# Patient Record
Sex: Male | Born: 1940
Health system: Southern US, Community
[De-identification: ages and names within clinical notes are randomized; demographics above are authoritative.]

## PROBLEM LIST (undated history)

## (undated) DIAGNOSIS — I509 Heart failure, unspecified: Secondary | ICD-10-CM

## (undated) DIAGNOSIS — F419 Anxiety disorder, unspecified: Secondary | ICD-10-CM

## (undated) DIAGNOSIS — Z950 Presence of cardiac pacemaker: Secondary | ICD-10-CM

## (undated) DIAGNOSIS — F32A Depression, unspecified: Secondary | ICD-10-CM

## (undated) DIAGNOSIS — K219 Gastro-esophageal reflux disease without esophagitis: Secondary | ICD-10-CM

## (undated) DIAGNOSIS — F1020 Alcohol dependence, uncomplicated: Secondary | ICD-10-CM

## (undated) DIAGNOSIS — Z720 Tobacco use: Secondary | ICD-10-CM

## (undated) DIAGNOSIS — M1990 Unspecified osteoarthritis, unspecified site: Secondary | ICD-10-CM

## (undated) DIAGNOSIS — E785 Hyperlipidemia, unspecified: Secondary | ICD-10-CM

## (undated) DIAGNOSIS — I480 Paroxysmal atrial fibrillation: Secondary | ICD-10-CM

## (undated) DIAGNOSIS — I779 Disorder of arteries and arterioles, unspecified: Secondary | ICD-10-CM

## (undated) DIAGNOSIS — I428 Other cardiomyopathies: Secondary | ICD-10-CM

## (undated) DIAGNOSIS — H269 Unspecified cataract: Secondary | ICD-10-CM

## (undated) DIAGNOSIS — I10 Essential (primary) hypertension: Secondary | ICD-10-CM

## (undated) DIAGNOSIS — G4700 Insomnia, unspecified: Secondary | ICD-10-CM

## (undated) DIAGNOSIS — I739 Peripheral vascular disease, unspecified: Secondary | ICD-10-CM

## (undated) DIAGNOSIS — F329 Major depressive disorder, single episode, unspecified: Secondary | ICD-10-CM

## (undated) DIAGNOSIS — J449 Chronic obstructive pulmonary disease, unspecified: Secondary | ICD-10-CM

## (undated) DIAGNOSIS — I2510 Atherosclerotic heart disease of native coronary artery without angina pectoris: Secondary | ICD-10-CM

## (undated) DIAGNOSIS — K279 Peptic ulcer, site unspecified, unspecified as acute or chronic, without hemorrhage or perforation: Secondary | ICD-10-CM

## (undated) HISTORY — PX: EYE SURGERY: SHX253

## (undated) HISTORY — DX: Hyperlipidemia, unspecified: E78.5

## (undated) HISTORY — DX: Anxiety disorder, unspecified: F41.9

## (undated) HISTORY — DX: Peripheral vascular disease, unspecified: I73.9

## (undated) HISTORY — DX: Gastro-esophageal reflux disease without esophagitis: K21.9

## (undated) HISTORY — DX: Tobacco use: Z72.0

## (undated) HISTORY — DX: Peptic ulcer, site unspecified, unspecified as acute or chronic, without hemorrhage or perforation: K27.9

## (undated) HISTORY — DX: Chronic obstructive pulmonary disease, unspecified: J44.9

## (undated) HISTORY — DX: Alcohol dependence, uncomplicated: F10.20

## (undated) HISTORY — DX: Heart failure, unspecified: I50.9

## (undated) HISTORY — DX: Essential (primary) hypertension: I10

## (undated) HISTORY — DX: Presence of cardiac pacemaker: Z95.0

## (undated) HISTORY — DX: Paroxysmal atrial fibrillation: I48.0

## (undated) HISTORY — DX: Unspecified osteoarthritis, unspecified site: M19.90

## (undated) HISTORY — PX: COLONOSCOPY: SHX174

## (undated) HISTORY — DX: Major depressive disorder, single episode, unspecified: F32.9

## (undated) HISTORY — DX: Depression, unspecified: F32.A

## (undated) HISTORY — DX: Insomnia, unspecified: G47.00

## (undated) HISTORY — PX: OTHER SURGICAL HISTORY: SHX169

## (undated) HISTORY — DX: Other cardiomyopathies: I42.8

## (undated) HISTORY — DX: Disorder of arteries and arterioles, unspecified: I77.9

## (undated) HISTORY — DX: Unspecified cataract: H26.9

---

## 1996-12-18 LAB — HM SIGMOIDOSCOPY

## 1997-11-04 ENCOUNTER — Encounter: Admission: RE | Admit: 1997-11-04 | Discharge: 1998-02-02 | Payer: Self-pay | Admitting: Neurosurgery

## 1998-07-20 ENCOUNTER — Observation Stay (HOSPITAL_COMMUNITY): Admission: AD | Admit: 1998-07-20 | Discharge: 1998-07-21 | Payer: Self-pay | Admitting: Cardiology

## 1998-07-20 ENCOUNTER — Encounter: Payer: Self-pay | Admitting: Cardiology

## 1999-11-19 ENCOUNTER — Ambulatory Visit (HOSPITAL_COMMUNITY): Admission: RE | Admit: 1999-11-19 | Discharge: 1999-11-19 | Payer: Self-pay | Admitting: Cardiology

## 1999-11-19 ENCOUNTER — Encounter: Payer: Self-pay | Admitting: Cardiology

## 2000-11-08 ENCOUNTER — Ambulatory Visit (HOSPITAL_COMMUNITY): Admission: RE | Admit: 2000-11-08 | Discharge: 2000-11-08 | Payer: Self-pay | Admitting: Gastroenterology

## 2001-09-23 ENCOUNTER — Inpatient Hospital Stay (HOSPITAL_COMMUNITY): Admission: EM | Admit: 2001-09-23 | Discharge: 2001-09-26 | Payer: Self-pay | Admitting: Cardiology

## 2002-02-07 ENCOUNTER — Encounter: Payer: Self-pay | Admitting: Orthopedic Surgery

## 2002-02-08 ENCOUNTER — Ambulatory Visit (HOSPITAL_COMMUNITY): Admission: RE | Admit: 2002-02-08 | Discharge: 2002-02-08 | Payer: Self-pay | Admitting: Orthopedic Surgery

## 2002-05-20 ENCOUNTER — Encounter: Payer: Self-pay | Admitting: *Deleted

## 2002-05-20 ENCOUNTER — Inpatient Hospital Stay (HOSPITAL_COMMUNITY): Admission: EM | Admit: 2002-05-20 | Discharge: 2002-05-21 | Payer: Self-pay | Admitting: Emergency Medicine

## 2002-08-08 ENCOUNTER — Inpatient Hospital Stay (HOSPITAL_COMMUNITY): Admission: EM | Admit: 2002-08-08 | Discharge: 2002-08-10 | Payer: Self-pay | Admitting: Emergency Medicine

## 2002-08-08 ENCOUNTER — Encounter: Payer: Self-pay | Admitting: Emergency Medicine

## 2002-08-30 ENCOUNTER — Ambulatory Visit (HOSPITAL_COMMUNITY): Admission: RE | Admit: 2002-08-30 | Discharge: 2002-08-31 | Payer: Self-pay | Admitting: Internal Medicine

## 2002-08-31 ENCOUNTER — Encounter: Payer: Self-pay | Admitting: Internal Medicine

## 2002-09-24 ENCOUNTER — Inpatient Hospital Stay (HOSPITAL_COMMUNITY): Admission: EM | Admit: 2002-09-24 | Discharge: 2002-09-28 | Payer: Self-pay | Admitting: Psychiatry

## 2003-02-11 ENCOUNTER — Emergency Department (HOSPITAL_COMMUNITY): Admission: EM | Admit: 2003-02-11 | Discharge: 2003-02-11 | Payer: Self-pay

## 2004-02-28 ENCOUNTER — Inpatient Hospital Stay (HOSPITAL_COMMUNITY): Admission: EM | Admit: 2004-02-28 | Discharge: 2004-02-29 | Payer: Self-pay | Admitting: *Deleted

## 2005-01-26 ENCOUNTER — Ambulatory Visit: Payer: Self-pay | Admitting: Internal Medicine

## 2005-01-31 ENCOUNTER — Ambulatory Visit: Payer: Self-pay

## 2006-08-17 ENCOUNTER — Ambulatory Visit: Payer: Self-pay | Admitting: Cardiology

## 2006-08-18 ENCOUNTER — Ambulatory Visit: Payer: Self-pay | Admitting: Internal Medicine

## 2006-08-24 ENCOUNTER — Encounter: Payer: Self-pay | Admitting: Cardiology

## 2006-08-24 ENCOUNTER — Ambulatory Visit: Payer: Self-pay | Admitting: Cardiology

## 2006-08-24 ENCOUNTER — Ambulatory Visit: Payer: Self-pay

## 2006-08-24 LAB — CONVERTED CEMR LAB
BUN: 10 mg/dL (ref 6–23)
Calcium: 9.5 mg/dL (ref 8.4–10.5)
Chloride: 92 meq/L — ABNORMAL LOW (ref 96–112)
Creatinine, Ser: 1.1 mg/dL (ref 0.4–1.5)
GFR calc Af Amer: 87 mL/min
GFR calc non Af Amer: 72 mL/min
Glucose, Bld: 94 mg/dL (ref 70–99)
Potassium: 4.6 meq/L (ref 3.5–5.1)
Sodium: 129 meq/L — ABNORMAL LOW (ref 135–145)

## 2006-11-07 ENCOUNTER — Ambulatory Visit: Payer: Self-pay | Admitting: Cardiology

## 2006-11-23 ENCOUNTER — Ambulatory Visit: Payer: Self-pay | Admitting: Cardiology

## 2007-03-14 ENCOUNTER — Ambulatory Visit: Payer: Self-pay | Admitting: Internal Medicine

## 2007-07-24 ENCOUNTER — Ambulatory Visit: Payer: Self-pay | Admitting: Internal Medicine

## 2007-07-24 LAB — CONVERTED CEMR LAB
Basophils Relative: 0.9 % (ref 0.0–1.0)
Eosinophils Absolute: 0.4 10*3/uL (ref 0.0–0.6)
Eosinophils Relative: 5.3 % — ABNORMAL HIGH (ref 0.0–5.0)
GFR calc Af Amer: 97 mL/min
GFR calc non Af Amer: 80 mL/min
Lymphocytes Relative: 30.6 % (ref 12.0–46.0)
Monocytes Absolute: 0.9 10*3/uL — ABNORMAL HIGH (ref 0.2–0.7)
Monocytes Relative: 10.8 % (ref 3.0–11.0)
Platelets: 273 10*3/uL (ref 150–400)
Potassium: 3.8 meq/L (ref 3.5–5.1)
RDW: 13.2 % (ref 11.5–14.6)
WBC: 8.1 10*3/uL (ref 4.5–10.5)
aPTT: 31.3 s — ABNORMAL HIGH (ref 21.7–29.8)

## 2007-07-31 ENCOUNTER — Ambulatory Visit (HOSPITAL_COMMUNITY): Admission: RE | Admit: 2007-07-31 | Discharge: 2007-07-31 | Payer: Self-pay | Admitting: Internal Medicine

## 2007-07-31 ENCOUNTER — Ambulatory Visit: Payer: Self-pay | Admitting: Internal Medicine

## 2007-08-13 ENCOUNTER — Ambulatory Visit: Payer: Self-pay

## 2007-11-14 ENCOUNTER — Ambulatory Visit: Payer: Self-pay | Admitting: Internal Medicine

## 2007-12-19 ENCOUNTER — Ambulatory Visit: Payer: Self-pay | Admitting: Cardiology

## 2008-05-30 ENCOUNTER — Ambulatory Visit: Payer: Self-pay | Admitting: Internal Medicine

## 2008-07-22 ENCOUNTER — Encounter: Payer: Self-pay | Admitting: Internal Medicine

## 2008-07-30 ENCOUNTER — Ambulatory Visit: Payer: Self-pay | Admitting: Cardiology

## 2008-08-22 ENCOUNTER — Ambulatory Visit: Payer: Self-pay | Admitting: Cardiology

## 2008-08-22 ENCOUNTER — Ambulatory Visit: Payer: Self-pay

## 2008-11-21 ENCOUNTER — Encounter: Payer: Self-pay | Admitting: Internal Medicine

## 2008-11-21 ENCOUNTER — Ambulatory Visit: Payer: Self-pay | Admitting: Internal Medicine

## 2009-05-29 ENCOUNTER — Ambulatory Visit: Payer: Self-pay | Admitting: Internal Medicine

## 2009-08-17 DIAGNOSIS — I6529 Occlusion and stenosis of unspecified carotid artery: Secondary | ICD-10-CM | POA: Insufficient documentation

## 2009-08-17 DIAGNOSIS — F17200 Nicotine dependence, unspecified, uncomplicated: Secondary | ICD-10-CM

## 2009-08-17 DIAGNOSIS — F1011 Alcohol abuse, in remission: Secondary | ICD-10-CM | POA: Insufficient documentation

## 2009-08-17 DIAGNOSIS — E785 Hyperlipidemia, unspecified: Secondary | ICD-10-CM

## 2009-08-17 DIAGNOSIS — F1010 Alcohol abuse, uncomplicated: Secondary | ICD-10-CM | POA: Insufficient documentation

## 2009-08-17 DIAGNOSIS — I428 Other cardiomyopathies: Secondary | ICD-10-CM | POA: Insufficient documentation

## 2009-08-17 DIAGNOSIS — I2510 Atherosclerotic heart disease of native coronary artery without angina pectoris: Secondary | ICD-10-CM

## 2009-08-19 ENCOUNTER — Ambulatory Visit: Payer: Self-pay | Admitting: Cardiology

## 2009-08-24 ENCOUNTER — Encounter: Payer: Self-pay | Admitting: Cardiology

## 2009-09-03 ENCOUNTER — Telehealth: Payer: Self-pay | Admitting: Cardiology

## 2010-04-16 ENCOUNTER — Telehealth: Payer: Self-pay | Admitting: Cardiology

## 2010-07-11 ENCOUNTER — Encounter: Payer: Self-pay | Admitting: *Deleted

## 2010-07-20 NOTE — Progress Notes (Signed)
Summary: pt very tired-denies other symptoms   Phone Note Call from Patient   Caller: Patient [phone] Reason for Call: Talk to Nurse Summary of Call: pt very tired for about a week-denies sob, dizziness, lightheadedness, chest pains, no syncope-pls advise Initial call taken by: Glynda Jaeger,  April 16, 2010 10:15 AM  Follow-up for Phone Call        Seems like at times he is OK but [place].  Has not been exercising like he should be.  Has not been seen by his primary care MD either.  Instructed pt that in that he is not having any other s/s - to follow up with his primary md.  He states understanding.  Hasn't had pacemaker checked in 6 months, will check with Paula/Kristen to see when pt needs to follow up. Follow-up by: Charolotte Capuchin, RN,  April 16, 2010 2:03 PM

## 2010-07-20 NOTE — Progress Notes (Signed)
Summary: LAB RESULTS   Phone Note Call from Patient Call back at [phone]   Caller: Patient Reason for Call: Lab or Test Results Initial call taken by: Judie Grieve,  September 03, 2009 2:38 PM  Follow-up for Phone Call        pt aware of results. Follow-up by: Charolotte Capuchin, RN,  September 03, 2009 4:02 PM

## 2010-07-20 NOTE — Assessment & Plan Note (Signed)
Summary: [facility]  Medications Added ZYRTEC ALLERGY 10 MG TABS (CETIRIZINE HCL) 1 by mouth daily      Allergies Added:   Visit Type:  Follow-up Primary Provider:  Helene Kelp, PAc  CC:  Cardiomyopathy.  History of Present Illness: The patient presents for followup of cardiomyopathy. In 2008 his ejection fraction on echo was 60% having been 20% in the past. He does have an ICD and has had this followed appropriately. He has had no heart failure symptoms since I last saw him. He has had no palpitations, presyncope or syncope. He has had no chest pain. He denies shortness of breath, PND or orthopnea. He stays active doing yard work but does not exercise routinely.  Current Medications (verified): 1)  Clonazepam 0.5 Mg Tabs (Clonazepam) .... Take 1 Tablet By Mouth Two Times A Day 2)  Metoprolol Succinate 200 Mg Xr24h-Tab (Metoprolol Succinate) .... Take One Tablet By Mouth Daily 3)  Amlodipine Besylate 10 Mg Tabs (Amlodipine Besylate) .... Take One-Half  Tablet By Mouth Daily 4)  Lisinopril 40 Mg Tabs (Lisinopril) .... Take One Tablet By Mouth Daily 5)  Omeprazole 20 Mg Cpdr (Omeprazole) .... Take 1 Capsule By Mouth Once A Day 6)  Simvastatin 40 Mg Tabs (Simvastatin) .... Take One Tablet By Mouth Daily At Bedtime 7)  Zyrtec Allergy 10 Mg Tabs (Cetirizine Hcl) .Marland Kitchen.. 1 By Mouth Daily  Allergies (verified): 1)  ! * Psuedoephedrine  Past History:  Past Medical History:  Nonischemic cardiomyopathy (EF had been 20% in   March of 2008 it was up to 60% with medical management), coronary artery   disease (last catheterization in 2003 demonstrated nonobstructive   disease with 30% proximal stenosis, 30% mid right coronary artery   stenosis), status post Guidant Contak Renewal biventricular implantable   defibrillator, ongoing alcohol use, ongoing tobacco use, dyslipidemia,   peptic ulcer disease, carotid stenosis (mild)     Past Surgical History:  Peptic ulcer disease status  post surgical repair.   ICD placement  Review of Systems       Sinus, allergies.  Otherwise negative for all otherwise negative.  Vital Signs:  Patient profile:   70 year old male Height:      68 inches Weight:      143 pounds BMI:     21.82 Pulse rate:   80 / minute Resp:     16 per minute BP sitting:   142 / 82  (right arm)  Vitals Entered By: Marrion Coy, CNA (August 19, 2009 9:45 AM)  Physical Exam  General:  Well developed, well nourished, in no acute distress. Head:  normocephalic and atraumatic Eyes:  PERRLA/EOM intact; conjunctiva and lids normal. Mouth:  Upper dentures, otherwise oral mucosa unremarkable Neck:  Neck supple, no JVD. No masses, thyromegaly or abnormal cervical nodes. Chest Wall:  Well-healed sternotomy scar Lungs:  Clear bilaterally to auscultation and percussion. Heart:  Non-displaced PMI, chest non-tender; regular rate and rhythm, S1, S2 without murmurs, rubs or gallops. Carotid upstroke normal, no bruit. Normal abdominal aortic size, no bruits. Femorals normal pulses, no bruits. Pedals normal pulses. No edema, no varicosities. Abdomen:  Bowel sounds positive; abdomen soft and non-tender without masses, organomegaly, or hernias noted. No hepatosplenomegaly, well-healed surgical scar Msk:  Back normal, normal gait. Muscle strength and tone normal. Extremities:  No clubbing or cyanosis. Neurologic:  Alert and oriented x 3. Skin:  Intact without lesions or rashes. Cervical Nodes:  no significant adenopathy Inguinal Nodes:  no significant adenopathy  Psych:  Normal affect.   EKG  Procedure date:  08/19/2009  Findings:      sinus rhythm with ventricular pacing 100% capture   ICD Specifications Following MD:  Hillis Range, MD     Referring MD:  [facility] ICD Vendor:  [facility] [employer]     ICD Model Number:  H225     ICD Serial Number:  [id number] ICD DOI:  07/31/2007     ICD Implanting MD:  Lewayne Bunting, MD  Lead 1:    Location: RA     DOI: 08/30/2002      Model #: [id number]     Serial #: [id number]     Status: active Lead 2:    Location: RV     DOI: 08/30/2002     Model #: [id number]     Serial #: [id number]     Status: active Lead 3:    Location: LV     DOI: 08/30/2002     Model #: [id number]     Serial #: [id number]     Status: active  Indications::  NICM, CHF   ICD Follow Up ICD Dependent:  No      Brady Parameters Mode DDD     Lower Rate Limit:  40     Upper Rate Limit 140 PAV 160      Tachy Zones VF:  210     VT:  165     Impression & Recommendations:  Problem # 1:  CARDIOMYOPATHY (ICD-425.4) I would not  suspect that his ejection fraction is low. He has no symptoms. He is on an excellent medical regimen. He should continue with this.  Problem # 2:  CAROTID ARTERY STENOSIS (ICD-433.10) He had mild bilateral carotid plaque in March of last year. This will be followed up again in March of next year. He will continue with risk reduction.  Problem # 3:  DYSLIPIDEMIA (ICD-272.4) I gave him a written note to go get a fasting lipid profile. I would be happy to review this. He has some mild coronary disease as well as the cerebrovascular disease. I think a reasonable goal would be an LDL less than 100 and HDL greater than 40.  Other Orders: EKG w/ Interpretation (93000)  Patient Instructions: 1)  Your physician recommends that you schedule a follow-up appointment in: 1 YEAR 2)  Your physician recommends that you continue on your current medications as directed. Please refer to the Current Medication list given to you today.

## 2010-07-21 ENCOUNTER — Encounter (INDEPENDENT_AMBULATORY_CARE_PROVIDER_SITE_OTHER): Payer: MEDICARE | Admitting: Internal Medicine

## 2010-07-21 ENCOUNTER — Ambulatory Visit: Admit: 2010-07-21 | Payer: Self-pay | Admitting: Internal Medicine

## 2010-07-21 ENCOUNTER — Encounter: Payer: Self-pay | Admitting: Internal Medicine

## 2010-07-21 DIAGNOSIS — I472 Ventricular tachycardia, unspecified: Secondary | ICD-10-CM | POA: Insufficient documentation

## 2010-07-21 DIAGNOSIS — I5022 Chronic systolic (congestive) heart failure: Secondary | ICD-10-CM

## 2010-07-28 NOTE — Assessment & Plan Note (Signed)
Summary: PACER CHECK OVER DUE/MT/OK TO DOUBLOE BOOK PER PAULA/PT DTR R...   Vital Signs:  Patient profile:   70 year old male Height:      68 inches Weight:      148 pounds BMI:     22.58 Pulse rate:   77 / minute Resp:     16 per minute BP sitting:   140 / 80  (right arm)  Vitals Entered By: Marrion Coy, CNA (July 21, 2010 10:16 AM)  Visit Type:  Follow-up Primary Provider:  Helene Kelp, PAc   History of Present Illness: The patient presents today for routine electrophysiology followup.  He has recently been noncompliant with device checks.  He reports doing very well since last being seen in our clinic. The patient denies symptoms of palpitations, chest pain, shortness of breath, orthopnea, PND, lower extremity edema, dizziness, presyncope, syncope, or neurologic sequela.  He is unware of any ICD shocks.  He continues to smoke.  The patient is tolerating medications without difficulties and is otherwise without complaint today.   Current Medications (verified): 1)  Clonazepam 0.5 Mg Tabs (Clonazepam) .... Take 1 Tablet By Mouth Two Times A Day 2)  Metoprolol Succinate 200 Mg Xr24h-Tab (Metoprolol Succinate) .... Take One Tablet By Mouth Daily 3)  Amlodipine Besylate 10 Mg Tabs (Amlodipine Besylate) .... Take One-Half  Tablet By Mouth Daily 4)  Lisinopril 40 Mg Tabs (Lisinopril) .... Take One Tablet By Mouth Daily 5)  Omeprazole 20 Mg Cpdr (Omeprazole) .... Take 1 Capsule By Mouth Once A Day 6)  Simvastatin 40 Mg Tabs (Simvastatin) .... Take One Tablet By Mouth Daily At Bedtime 7)  Zyrtec Allergy 10 Mg Tabs (Cetirizine Hcl) .Marland Kitchen.. 1 By Mouth Daily  Allergies (verified): 1)  ! * Psuedoephedrine  Past History:  Past Medical History:  Nonischemic cardiomyopathy (EF had been 20% in   March of 2008 it was up to 60% with medical management), coronary artery   disease (last catheterization in 2003 demonstrated nonobstructive   disease with 30% proximal stenosis, 30% mid right  coronary artery   stenosis), status post Guidant Contak Renewal biventricular implantable   defibrillator, ongoing alcohol use, ongoing tobacco use, dyslipidemia,   peptic ulcer disease, carotid stenosis (mild)  ventricular tachycardia appropriately treated with ATP therapy  Past Surgical History: Reviewed history from 08/19/2009 and no changes required.  Peptic ulcer disease status post surgical repair.   ICD placement  Social History: Reviewed history from 08/17/2009 and no changes required.  Lives in [place] by himself.  He is disabled and  divorced with four children.  He is active using a tiller and works out in  the yard.  He continues to smoke a pack per day.  He drinks three to four  beers per day.  He uses marijuana about twice a week to help his appetite.  Review of Systems       All systems are reviewed and negative except as listed in the HPI.   Physical Exam  General:  Well developed, well nourished, in no acute distress. Head:  normocephalic and atraumatic Eyes:  PERRLA/EOM intact; conjunctiva and lids normal. Mouth:  Teeth, gums and palate normal. Oral mucosa normal. Neck:  supple Chest Wall:  ICD pocket is well healed Lungs:  Clear bilaterally to auscultation and percussion. Heart:  RRR, no m/r/g Abdomen:  Bowel sounds positive; abdomen soft and non-tender without masses, organomegaly, or hernias noted. No hepatosplenomegaly. Msk:  Back normal, normal gait. Muscle strength and tone normal.  Extremities:  No clubbing or cyanosis. Neurologic:  Alert and oriented x 3.   Impression & Recommendations:  Problem # 1:  CARDIOMYOPATHY (ICD-425.4) The patient has chronic systolic dysfunction. He appears optivolemic on exam today. NO changes  Problem # 2:  PAROXYSMAL VENTRICULAR TACHYCARDIA (ICD-427.1) device interrogation reveals VT appropriately treated with ATP. Normal ICD function no changes today compliance with metoprolol encouraged  Problem # 3:  CAD  (ICD-414.00) no sypmtoms of ischemia no changes  Problem # 4:  TOBACCO ABUSE (ICD-305.1) cessation advised he is not ready to quit  Problem # 5:  DYSLIPIDEMIA (ICD-272.4) stable His updated medication list for this problem includes:    Simvastatin 40 Mg Tabs (Simvastatin) .Marland Kitchen... Take one tablet by mouth daily at bedtime  Patient Instructions: 1)  Your physician recommends that you schedule a follow-up appointment in: 3 months in [facility] 2)  Your physician recommends that you continue on your current medications as directed. Please refer to the Current Medication list given to you today.       ICD Specifications Following MD:  Hillis Range, MD     Referring MD:  [facility] ICD Vendor:  [facility] Scientific     ICD Model Number:  H225     ICD Serial Number:  [id number] ICD DOI:  07/31/2007     ICD Implanting MD:  Lewayne Bunting, MD  Lead 1:    Location: RA     DOI: 08/30/2002     Model #: 6213     Serial #: [id number]     Status: active Lead 2:    Location: RV     DOI: 08/30/2002     Model #: [id number]     Serial #: [id number]     Status: active Lead 3:    Location: LV     DOI: 08/30/2002     Model #: 1324     Serial #: [id number]     Status: active  Indications::  NICM, CHF   ICD Follow Up Battery Voltage:  2.90 V     Charge Time:  6.9 seconds     Battery Est. Longevity:  BOL Underlying rhythm:  SR ICD Dependent:  No       ICD Device Measurements Atrium:  Amplitude: 4.1 mV, Impedance: 718 ohms, Threshold: 0.8 V at 0.4 msec Right Ventricle:  Amplitude: 22.8 mV, Impedance: 701 ohms, Threshold: 0.6 V at 0.4 msec Left Ventricle:  Amplitude: 18.4 mV, Impedance: 751 ohms, Threshold: 1.6 V at 0.4 msec Shock Impedance: 47 ohms   Episodes MS Episodes:  0     Percent Mode Switch:  <0.1%     Coumadin:  No Shock:  0     ATP:  3     Nonsustained:  1     ICD Appropriate Therapy?  Yes  Brady Parameters Mode DDD     Lower Rate Limit:  40     Upper Rate Limit 140 PAV 160      Tachy Zones VF:  210     VT:   165     Tech Comments:  PRARP reprogrammed for PMT.  VT 1 episodes 171-195bpm.  No Latitude @ this time, patients does not have a land line. ROV 3 months [facility] clinic. Altha Harm, LPN  July 21, 2010 11:16 AM  MD Comments:  Three episodes of VT each appropriately treated with ATP.  VT rates 171-195,  Device also reprogrammed as above for PMT.

## 2010-09-09 ENCOUNTER — Encounter: Payer: Self-pay | Admitting: Nurse Practitioner

## 2010-09-09 DIAGNOSIS — M479 Spondylosis, unspecified: Secondary | ICD-10-CM

## 2010-09-09 DIAGNOSIS — G4700 Insomnia, unspecified: Secondary | ICD-10-CM | POA: Insufficient documentation

## 2010-09-09 DIAGNOSIS — K589 Irritable bowel syndrome without diarrhea: Secondary | ICD-10-CM

## 2010-10-06 ENCOUNTER — Ambulatory Visit (INDEPENDENT_AMBULATORY_CARE_PROVIDER_SITE_OTHER): Payer: Medicare Other | Admitting: Cardiology

## 2010-10-06 ENCOUNTER — Encounter: Payer: Self-pay | Admitting: Cardiology

## 2010-10-06 VITALS — BP 150/88 | HR 62 | Ht 68.0 in | Wt 153.0 lb

## 2010-10-06 DIAGNOSIS — I428 Other cardiomyopathies: Secondary | ICD-10-CM

## 2010-10-06 DIAGNOSIS — I6529 Occlusion and stenosis of unspecified carotid artery: Secondary | ICD-10-CM

## 2010-10-06 DIAGNOSIS — E785 Hyperlipidemia, unspecified: Secondary | ICD-10-CM

## 2010-10-06 DIAGNOSIS — I472 Ventricular tachycardia, unspecified: Secondary | ICD-10-CM

## 2010-10-06 DIAGNOSIS — F1010 Alcohol abuse, uncomplicated: Secondary | ICD-10-CM

## 2010-10-06 DIAGNOSIS — F17200 Nicotine dependence, unspecified, uncomplicated: Secondary | ICD-10-CM

## 2010-10-06 MED ORDER — ATORVASTATIN CALCIUM 20 MG PO TABS: 20.0000 mg | ORAL_TABLET | Freq: Every day | ORAL | Status: DC

## 2010-10-06 NOTE — Assessment & Plan Note (Signed)
We discussed the need to stop alcohol. I do believe this was the etiology of his cardiomyopathy initially

## 2010-10-06 NOTE — Progress Notes (Signed)
HPI The patient presented for one year followup. Since I last saw him he has had no cardiovascular length. He says he can climb a flight of stairs without dyspnea. He denies any PND or orthopnea. He denies any chest pressure, neck or arm discomfort. He has no new palpitations and says firing of his defibrillator. He has no presyncope or syncope. He unfortunately is drinking about 4 beers per day and he continues to smoke cigarettes. He has gained some weight but has had no edema.  Allergies  Allergen Reactions  . Sudafed (Pseudoephedrine Hcl)   . Avelox (Moxifloxacin Hcl In Nacl)     Current Outpatient Prescriptions  Medication Sig Dispense Refill  . amLODipine (NORVASC) 5 MG tablet Take 5 mg by mouth daily.        . cetirizine (ZYRTEC) 10 MG tablet Take 10 mg by mouth daily.        . clonazePAM (KLONOPIN) 1 MG tablet Take 1 mg by mouth 2 (two) times daily as needed.        Marland Kitchen lisinopril (PRINIVIL,ZESTRIL) 40 MG tablet Take 40 mg by mouth daily.        . metoprolol (LOPRESSOR) 100 MG tablet Take 100 mg by mouth 2 (two) times daily.        Marland Kitchen omeprazole (PRILOSEC) 20 MG capsule Take 20 mg by mouth 2 (two) times daily.        . pravastatin (PRAVACHOL) 40 MG tablet daily      . PROAIR HFA 108 (90 BASE) MCG/ACT inhaler       . simvastatin (ZOCOR) 40 MG tablet Take 40 mg by mouth at bedtime.          Past Medical History  Diagnosis Date  . Cardiomyopathy     EF was 20% (60% last echo 2008)  . Tobacco abuse   . Carotid artery disease   . ETOH dependence   . Peptic ulcer disease   . Dyslipidemia     Past Surgical History  Procedure Date  . Ulcer surgery   . Icd     ROS: As stated in the HPI and negative for all other systems.  PHYSICAL EXAM BP 150/88  Pulse 62  Ht 5\' 8"  (1.727 m)  Wt 153 lb (69.4 kg)  BMI 23.26 kg/m2 GENERAL:  Well appearing HEENT:  Pupils equal round and reactive, fundi not visualized, oral mucosa unremarkable NECK:  No jugular venous distention, waveform  within normal limits, carotid upstroke brisk and symmetric, no bruits, no thyromegaly LYMPHATICS:  No cervical, inguinal adenopathy LUNGS:  Clear to auscultation bilaterally BACK:  No CVA tenderness, ICD pocket CHEST:  Unremarkable HEART:  PMI not displaced or sustained,S1 and S2 within normal limits, no S3, no S4, no clicks, no rubs, no murmurs ABD:  Flat, positive bowel sounds normal in frequency in pitch, no bruits, no rebound, no guarding, no midline pulsatile mass, no hepatomegaly, no splenomegaly EXT:  2 plus pulses throughout, no edema, no cyanosis no clubbing SKIN:  No rashes no nodules NEURO:  Cranial nerves II through XII grossly intact, motor grossly intact throughout PSYCH:  Cognitively intact, oriented to person place and time   EKG:  Sinus rhythm with ventricular pacing, rate 59  ASSESSMENT AND PLAN

## 2010-10-06 NOTE — Assessment & Plan Note (Signed)
He has not had an echo since 2008. A followup with this for now he will continue meds as listed.

## 2010-10-06 NOTE — Patient Instructions (Addendum)
You have been scheduled for a Carotid doppler and a 2 D Echo for 10/14/2010 at 11:30am.  You will be called with results. Please see Dr Antoine Poche in 1 yr in the [place] office. Start Lipitor 20 mg once a day

## 2010-10-06 NOTE — Assessment & Plan Note (Signed)
He has had mild plaquing he is scheduled to have carotid Dopplers soon.

## 2010-10-06 NOTE — Assessment & Plan Note (Signed)
We discussed tobacco cessation and he thinks he will try Pepcid.

## 2010-10-06 NOTE — Assessment & Plan Note (Addendum)
I reviewed his lipids from this year. This was done in March. He has an excellent profile however, he needs to switch to Lipitor 20 mg as he is on Norvasc.

## 2010-10-07 ENCOUNTER — Encounter: Payer: MEDICARE | Admitting: Cardiology

## 2010-10-13 ENCOUNTER — Other Ambulatory Visit: Payer: Self-pay | Admitting: Cardiology

## 2010-10-13 DIAGNOSIS — I6529 Occlusion and stenosis of unspecified carotid artery: Secondary | ICD-10-CM

## 2010-10-14 ENCOUNTER — Other Ambulatory Visit: Payer: Self-pay | Admitting: *Deleted

## 2010-10-14 ENCOUNTER — Ambulatory Visit (HOSPITAL_COMMUNITY): Payer: Medicare Other | Attending: Cardiology

## 2010-10-14 ENCOUNTER — Encounter (INDEPENDENT_AMBULATORY_CARE_PROVIDER_SITE_OTHER): Payer: Medicare Other | Admitting: *Deleted

## 2010-10-14 DIAGNOSIS — I6529 Occlusion and stenosis of unspecified carotid artery: Secondary | ICD-10-CM

## 2010-10-14 DIAGNOSIS — I509 Heart failure, unspecified: Secondary | ICD-10-CM | POA: Insufficient documentation

## 2010-10-14 DIAGNOSIS — E785 Hyperlipidemia, unspecified: Secondary | ICD-10-CM | POA: Insufficient documentation

## 2010-10-14 DIAGNOSIS — F17200 Nicotine dependence, unspecified, uncomplicated: Secondary | ICD-10-CM | POA: Insufficient documentation

## 2010-10-14 DIAGNOSIS — I428 Other cardiomyopathies: Secondary | ICD-10-CM

## 2010-10-18 ENCOUNTER — Encounter: Payer: Self-pay | Admitting: Cardiology

## 2010-10-18 NOTE — Progress Notes (Signed)
Pt aware of results of Echo and Carotid

## 2010-10-18 NOTE — Progress Notes (Signed)
Pt aware of results of Echo and Carotid 

## 2010-11-02 NOTE — Assessment & Plan Note (Signed)
[facility]                            CARDIOLOGY OFFICE NOTE   NAME:[name], [name]                     MRN:          [id number]  DATE:11/22/2006                            DOB:          08-10-1940    PRIMARY CARE PHYSICIAN:  [facility]   REASON FOR PRESENTATION:  Evaluate patient with cardiomyopathy and  coronary disease.   HISTORY OF PRESENT ILLNESS:  The patient returns for followup.  He was  apparently hospitalized at [facility] two weeks ago with chest discomfort.  I do not have any of these records but will be ordering this.  Apparently, there was no cardiac etiology identified.  He has had no  further chest discomfort.  He says his breathing is fine.  He has had no  shortness of breath, PND, or orthopnea.  I did an echocardiogram when I  saw him in March 2008.  This demonstrated that his ejection fraction was  still 60% (improved from previous of 20%).  He is down to a half pack of  cigarettes a day.  He is down to 4 beers a day.  He thinks he can quit.  He did start Chantix but had severe nightmares with this.   PAST MEDICAL HISTORY:  1. Nonischemic cardiomyopathy.  2. Coronary artery disease, nonobstructive (last catheterization in      2003 with 30% proximal stenosis, 30% mid-right coronary artery      stenosis).  3. Status post Guidant Contak Renewal biventricular implantable      defibrillator.  4. Ongoing alcohol use.  5. Dyslipidemia.  6. Ongoing tobacco use.  7. Peptic ulcer disease.   ALLERGIES:  None.   MEDICATIONS:  1. Clonazepam 0.5 mg b.i.d.  2. Metoprolol 200 mg daily.  3. Amlodipine 5 mg daily.  4. Lisinopril 40 mg daily.  5. Omeprazole 20 mg daily.  6. Simvastatin 40 mg daily.  7. Aspirin 81 mg daily.   REVIEW OF SYSTEMS:  As stated in the HPI.  Otherwise negative for other  systems.   PHYSICAL EXAMINATION:  GENERAL:  The patient is in no distress.  VITAL SIGNS:  Blood pressure  146/80, heart rate 63 and regular, weight  145 pounds, body mass index 22.  NECK:  No jugular venous distention.  Waveform within normal limits.  Carotid upstrokes brisk and symmetrical.  No bruits.  No thyromegaly.  LYMPHATIC:  No lymphadenopathy.  LUNGS:  Clear to auscultation bilaterally.  BACK:  No costovertebral angle tenderness.  CHEST:  Unremarkable.  HEART:  PMI not displaced or sustained.  S1 and S2 within normal limits.  No S3, no S4, no clicks, no rubs, no murmurs.  ABDOMEN:  Obese.  Positive bowel sounds.  Normal in frequency and pitch.  No bruits.  No rebound, and no guarding.  No midline pulsatile mass,  hepatomegaly, or splenomegaly.  SKIN:  No rashes, no nodules.  EXTREMITIES:  2+ pulses.  No cyanosis, no clubbing, no edema.  NEUROLOGIC:  Oriented to person, place, and time.  Cranial nerves II-XII  grossly intact.  Motor grossly intact throughout.   EKG:  Sinus  rhythm, ventricular paced rhythm.  No acute ST-T wave  changes.   ASSESSMENT AND PLAN:  1. Chest discomfort.  The patient had the chest discomfort but is      currently not having this.  I will look at the [facility] results and      see if any followup is necessary.  For now, he will continue with      secondary risk reduction.  2. Cardiomyopathy.  His ejection fraction has improved.  He will      continue the medications as listed.  3. Hypertension.  Blood pressure is slightly elevated, but this is      more than usual.  I will not make any change to his medicines at      this time.  4. Tobacco.  We discussed the need to stopped smoking (greater than 30      minutes).  He is going to try to continue to taper down, though I      told him this is usually less successful.  5. Followup.  I will see him back in one year or sooner if needed.     Rollene Rotunda, MD, [facility]  Electronically Signed    JH/MedQ  DD: 11/22/2006  DT: 11/22/2006  Job #: [id number]   cc:   [facility]

## 2010-11-02 NOTE — Assessment & Plan Note (Signed)
[facility]                         ELECTROPHYSIOLOGY OFFICE NOTE   NAME:[name], [name]                     MRN:          [id number]  DATE:07/24/2007                            DOB:          11-06-1940    [name] comes in today for unscheduled visit complaining that his  defibrillator is beeping.   The patient is a very pleasant 66-year male with a nonischemic  cardiomyopathy, severe LV dysfunction, status post BiV-ICD insertion  secondary to all of the above and congestive heart failure.  The  patient's initial implant was back in March of 2004.  He has done well  from a heart failure perspective.  His right atrial, RV, and LV leads  have been working satisfactorily with satisfactory pacing thresholds.  His battery voltages and charge have not reached ERI, and he is here for  additional followup.   PHYSICAL EXAM:  He is a pleasant well-appearing 66-year man in no acute  distress.  Blood pressure was 115/75; the pulse was 72 and regular; respirations  were 18.  Weight was not recorded.  NECK:  Revealed no jugular distention.  There was no thyromegaly.  Trachea was midline.  The carotids 2+ and symmetric.  LUNGS:  Clear bilaterally to auscultation.  No wheezes, rales, or  rhonchi were present.  CARDIOVASCULAR EXAM:  Revealed a regular rate and rhythm with a normal  S1 and S2.  There were no murmurs, rubs, or gallops.  EXTREMITIES:  Demonstrated no cyanosis, clubbing, or edema.  SKIN EXAM:  Normal.  ABDOMINAL EXAM:  Soft, nontender, nondistended.  No organomegaly.   IMPRESSION:  1. Nonischemic cardiomyopathy.  2. Congestive heart failure.  3. Status post biopsy ICD insertion, now reaching ERI.   DISCUSSION:  The risks, benefits, goals, and expectation of ICD  generator changing was discussed with the patient and he wished to  proceed.  This will be scheduled at the earliest possible convenient  time.     Doylene Canning. Ladona Ridgel, MD  Electronically Signed    GWT/MedQ  DD: 07/24/2007  DT: 07/25/2007  Job #: [id number]

## 2010-11-02 NOTE — Assessment & Plan Note (Signed)
[facility]                            CARDIOLOGY OFFICE NOTE   NAME:[name], [name]                     MRN:          [id number]  DATE:12/19/2007                            DOB:          [birth year]    PRIMARY:  Montey Hora, PA   REASON FOR PRESENTATION:  The patient with cardiomyopathy.   HISTORY OF PRESENT ILLNESS:  The patient returns for yearly follow-up.  He is now 70 years old.  He has done well since I last saw him.  He  unfortunately continues to smoke half pack of cigarettes and drink 40-5  beers at night.  Despite this, he is getting along well.  He is able to  do his yard work.  He denies any shortness of breath.  He has no PND or  orthopnea.  He has no chest discomfort, neck, or arm discomfort.  He has  no palpitations, presyncope, or syncope.  He has had followup within the  year in the EP clinic for his ICD.  His last ejection fraction was 60%  on echocardiography, March 2008.   PAST MEDICAL HISTORY:  Nonischemic myopathy (EF had been 20%), coronary  artery disease (nonobstructive.  Last catheterization 2003 with 30%  proximal stenosis, 30% mid right coronary artery stenosis), status post  Guidant Contak Renewal biventricular implantable defibrillator, ongoing  alcohol use, ongoing tobacco use, dyslipidemia, peptic ulcer disease  status post surgical repair.   ALLERGIES:  None.   MEDICATIONS:  1. Clonazepam 0.5 mg b.i.d.  2. Metoprolol 2 mg daily.  3. Amlodipine 5 mg daily.  4. Lisinopril 40 mg daily.  5. Omeprazole 20 mg daily.  6. Simvastatin 40 mg daily.  7. Aspirin 81 mg daily.  8. Vitamin C.   REVIEW OF SYSTEMS:  As stated in HPI and otherwise negative for other  systems.   PHYSICAL EXAMINATION:  GENERAL:  The patient is in no distress.  VITAL SIGNS:  Blood pressure 146/88, heart 59 regular, weight 148  pounds, body mass index 22.  NECK:  No jugular venous distention 45 degrees, carotid upstroke brisk  and  symmetrical, no bruits, thyromegaly.  Lymphatics no adenopathy.  LUNGS:  Clear to auscultation bilaterally.  CHEST:  Well-healed ICD pocket.  HEART:  BMI not displaced or sustained, S1 and S2 within normal, no S3-  S4, no clicks, rubs, murmurs.  ABDOMEN:  Flat, positive bowel sounds normal in frequency pitch, no  bruits, no rebound, no guarding, no midline pulsatile mass.  No  hepatosplenomegaly.  SKIN:  No rashes.  EXTREMITIES:  2+ pulses, no cyanosis, clubbing, no edema.  NEURO:  Oriented to person, place, and time, cranial nerves II-XII  grossly intact, motor grossly intact.   EKG sinus rhythm with a ventricular pacing 100% capture.   ASSESSMENT/PLAN:  1. Cardiomyopathy.  His ejection fraction improved by echo last year.      I have no reason clinically to suspect that it is deteriorated.  He      will continue on the medications as listed.  2. Hypertension.  Blood pressure is very slightly elevated, but in  general has been well-controlled and he will continue the medicines      as listed.  3. Tobacco.  I discussed with him the need to stop smoking again.  He      does not think he can do this.  4. Alcohol.  I discussed with him the need to stop drinking alcohol.      He does not think he can do this.  5. Dyslipidemia.  We will get a lipid profile and send him for some      other labs in anticipation of him seeing Mr. Webster soon for his      yearly followup.  6. Follow up.  I will see him back in 1 year or sooner if needed.     Rollene Rotunda, MD, [facility]  Electronically Signed    JH/MedQ  DD: 12/19/2007  DT: 12/20/2007  Job #: [id number]   cc:   Montey Hora, PA

## 2010-11-02 NOTE — Assessment & Plan Note (Signed)
[facility]                            CARDIOLOGY OFFICE NOTE   NAME:[name], [name]                     MRN:          [id number]  DATE:07/30/2008                            DOB:          March 29, 1941    PRIMARY CARE PHYSICIAN:  Montey Hora P. A.   REASON FOR PRESENTATION:  Evaluate the patient with cardiomyopathy.   HISTORY OF PRESENT ILLNESS:  The patient is 70 year old.  He returns for  11-month followup.  Since I last saw him, he has been doing relatively  well.  He unfortunately still smoking half a pack to a pack of  cigarettes a day.  He is drinking 4 or 5 beers nightly.  He gets out and  does activities when the weather is cooperative.  He said with this he  denies any shortness of breath.  He has had no PND or orthopnea.  He has  had no palpitations, presyncope, or syncope.  He has had no chest  discomfort, neck or arm discomfort.  He does have follow up in the EP  Clinic for management of his implantable defibrillator.   Of note, the patient does describe carotid plaque that he had diagnosed  in the past.  Not sure where this was done.  This has not been followed  up recently.   PAST MEDICAL HISTORY:  Nonischemic cardiomyopathy (EF had been 20% in  March of 2008 it was up to 60% with medical management), coronary artery  disease (last catheterization in 2003 demonstrated nonobstructive  disease with 30% proximal stenosis, 30% mid right coronary artery  stenosis), status post Guidant Contak Renewal biventricular implantable  defibrillator, ongoing alcohol use, ongoing tobacco use, dyslipidemia,  peptic ulcer disease, status post surgical repair, carotid stenosis  (details not clear).   ALLERGIES:  None.   MEDICATIONS:  1. Clonazepam 0.5 mg b.i.d.  2. Metoprolol 200 mg b.i.d.  3. Amlodipine 5 mg daily.  4. Lisinopril 40 mg daily.  5. Omeprazole 20 mg daily.  6. Simvastatin 40 mg daily.  7. Vitamin C.   REVIEW OF SYSTEMS:  As  stated in the HPI, and otherwise negative for  other systems.   PHYSICAL EXAMINATION:  GENERAL:  The patient is in no distress.  VITAL SIGNS:  Blood pressure 134/74, heart rate 58 and regular.  HEENT:  Eyelids unremarkable; pupils equal, round, and reactive to  light; fundi not visualized, oral mucosa unremarkable.  NECK:  No jugular venous distension at 45 degrees, carotid upstroke  brisk and symmetric, questionable soft right carotid bruit.  No  thyromegaly.  LYMPHATICS:  No cervical, axillary, or inguinal adenopathy.  LUNGS:  Clear to auscultation bilaterally.  BACK:  No costovertebral angle tenderness.  CHEST:  Well-healed ICD pocket.  HEART:  PMI not displaced or sustained.  S1 and S2 are within normal  limits.  No S3, no S4.  No clicks, no rubs, no murmurs.  ABDOMEN:  Obese, positive bowel sounds, normal in frequency and pitch.  No bruits, no rebound, no guarding.  No midline pulsatile mass.  No  hepatomegaly.  No splenomegaly.  SKIN:  No rashes,  no nodules.  EXTREMITIES:  Pulses 2+ throughout, no edema, no cyanosis, no clubbing.  NEURO:  Oriented to person, place, and time.  Cranial nerves II through  XII grossly intact.  Motor grossly intact.   EKG:  Sinus rhythm, rate 57, left ventricular pacing 100% capture.   ASSESSMENT AND PLAN:  1. Cardiomyopathy.  I have no reason to believe that his ejection      fraction has fallen.  He is not having any symptoms.  He will      continue on the meds as listed.  2. Status post ICD.  He will have continued followup in the Device      Clinic.  3. Carotid stenosis.  I will go ahead and order carotid Dopplers and      have him get his ICD checked in the [place] office to have both      done at the same time.  He usually gets his ICD checked in [place].  4. Tobacco.  We spent time talking about stopping smoking (greater      than 3 minutes).  He is going to try and wants to try cold [place].      He did this before.  5. Alcohol.  He  knows that I do not think he should be drinking at      all, but at least limiting it to 1 or 2 beers at night at the most.  6. Hypertension.  Blood pressure is controlled and the meds as listed      and he will continue with these.  7. Dyslipidemia.  I reviewed his lipids at [facility].  His      LDL was 95.  His HDL was 75.  This is an excellent ratio.  8. Followup.  I will see him again in 1 year or sooner as needed.     Rollene Rotunda, MD, [facility]  Electronically Signed    JH/MedQ  DD: 07/30/2008  DT: 07/31/2008  Job #: [id number]   cc:   Ernestina Penna, M.D.

## 2010-11-02 NOTE — Op Note (Signed)
[name], [name]              ACCOUNT NO.:  [id number]   MEDICAL RECORD NO.:  [id number]          PATIENT TYPE:  INP   LOCATION:  2857                         FACILITY:  [facility]   PHYSICIAN:  Doylene Canning. Ladona Ridgel, MD    DATE OF BIRTH:  1940/10/01   DATE OF PROCEDURE:  07/31/2007  DATE OF DISCHARGE:                               OPERATIVE REPORT   PROCEDURE PERFORMED:  Removal of previously  implanted biventricular  implantable cardioverter-defibrillator and insertion of a biventricular  implantable cardioverter-defibrillator with implantable cardioverter-  defibrillator pocket revision and defibrillation threshold testing.   INTRODUCTION:  The  patient is 82 male with a longstanding  cardiomyopathy, congestive heart failure, status post bi-V ICD  insertion.  He has now reached elective replacement indication and now  returns for implantation of a new device.   PROCEDURE:  After informed was obtained, the patient was taken to the  diagnostic EP lab in fasting state.  After usual preparation and  draping, intravenous fentanyl and Midazolam was given for sedation.  30  mL lidocaine was infiltrated over the old ICD insertion site.  7 cm  incision was carried out over this region.  Electrocautery utilized to  dissect down to the ICD pocket.  The generator was removed with gentle  traction.  The leads were interrogated and evaluated and found be  working satisfactorily.  The P-waves were 4, the R-waves were 26, the  impedance 754 in the atrium, 754 the RV and 69 in the LV.  Thresholds  were 0.9 at 0.5 in the right atrium, 0.9 at 0.5 the right ventricle and  1.4 at 0.5 in the left ventricle.  With these satisfactory parameters  the pocket was irrigated with kanamycin and then the pocket was revised  to accommodate a larger ICD.  Having accomplished this, the new [employer] bi-V ICD, serial number [id number] was connected to the  right atrial RV and LV leads and placed back in the  subcutaneous pocket.  Additional irrigation was carried out.  The patient was more deeply  sedated with fentanyl and Versed.   It should be noted the patient was very difficult to sedate secondary to  chronic benzodiazepine use.  After he was satisfactorily sedated, VF was  induced with T-wave shock and a 21 joules shock was delivered which  terminated VF and restored sinus rhythm.  There is no significant  undersensing noted.  At this point the pocket was closed with a layer of  2-0 Vicryl followed by a layer of 3-0 Vicryl.  Benzoin was painted on  the skin, Steri-Strips were applied and pressure dressing was placed.  The patient was returned to his room in satisfactory condition.   COMPLICATIONS:  There were no immediate procedure complications.   RESULTS:  This demonstrates successful removal of previously implanted  Guidant ICD which is at elective replacement indication followed by  insertion of a new device with ICD pocket revision and defibrillation  threshold testing.      Doylene Canning. Ladona Ridgel, MD  Electronically Signed     GWT/MEDQ  D:  07/31/2007  T:  08/02/2007  Job:  [id number]   cc:   Rollene Rotunda, MD, [facility]

## 2010-11-02 NOTE — Assessment & Plan Note (Signed)
[facility]                            CARDIOLOGY OFFICE NOTE   NAME:[name], [name]                     MRN:          [id number]  DATE:03/14/2007                            DOB:          1941/03/19    [name] returns today for followup.  He is a very pleasant man with  a history of nonischemic cardiomyopathy, congestive heart failure, left  bundle branch block, status post BiV ICD insertion.  He returns today  for followup.  He continues to do quite well.  He remains active,  working in the yard, and also has been fishing regularly without  difficulty.  He has had no heart failure admissions in the last year.   PHYSICAL EXAMINATION:  On exam today, he is a pleasant, well-appearing  man in no acute distress.  Blood pressure today was 150/90.  The pulse was 79 and regular.  Respirations were 16.  Weight was 148 pounds.  NECK:  No jugular venous distention.  LUNGS:  Clear bilaterally to auscultation.  There are no wheezes, rales  or rhonchi.  CARDIOVASCULAR:  Regular rate and rhythm with a normal S1 and a split  S2.  EXTREMITIES:  No edema.   His medications include metoprolol 200 mg daily in divided doses,  amlodipine 10 mg 1/2 tablet daily, lisinopril 40 mg a day, omeprazole,  simvastatin 80 mg 1/2 tablet daily, and aspirin 81 mg daily.   IMPRESSION:  1. Nonischemic cardiomyopathy.  2. Congestive heart failure.  3. Status post implantable cardioverter/defibrillator insertion.   DISCUSSION:  Overall, Mr. Lumm is stable, and his defibrillator is  working normally.  We will plan to see him back in the office in one  year.  He will follow up in our device clinic sooner.     Doylene Canning. Ladona Ridgel, MD  Electronically Signed    GWT/MedQ  DD: 03/14/2007  DT: 03/15/2007  Job #: [id number]

## 2010-11-05 NOTE — Consult Note (Signed)
NAME:  [name], [name]                        ACCOUNT NO.:  [id number]   MEDICAL RECORD NO.:  [id number]                   PATIENT TYPE:  INP   LOCATION:  3704                                 FACILITY:  [facility]   PHYSICIAN:  Iva Boop, M.D. [facility]           DATE OF BIRTH:  Jan 23, 1941   DATE OF CONSULTATION:  02/29/2004  DATE OF DISCHARGE:                                   CONSULTATION   REFERRING PHYSICIAN:  Vida Roller, M.D.   PRIMARY CARE PHYSICIAN:  Caren Macadam, P.A.-C   REASON FOR CONSULTATION:  Diverticulitis.   HISTORY OF PRESENT ILLNESS:  This is a pleasant, 70 year old, white man with  known cardiomyopathy who was admitted to the hospital with syncope.  He was  in his usual state of health until the day before admission when he  developed hives (a chronic recurrent problem), nausea, vomiting and  abdominal pain.  Subsequently, he had a syncopal episode.  He is ruled out  for MI.  He was complaining of some abdominal pain and underwent a CT scan  of the abdomen and pelvis which showed some minimal changes of  diverticulitis at the junction of the descending and sigmoid colon.  I have  self-viewed that CT and concur.  He feels much better now.  He has not had  constipation or diarrhea other than acutely with this problem and he took  some Correctol at that time as well.  He actually passed out for a few  minutes after standing post vomiting.  He then presented after further  nausea and vomiting to the emergency room and then was admitted on September  10.   PAST MEDICAL HISTORY:  1.  Hypertension.  2.  Dyslipidemia.  3.  Chronic alcohol use.  4.  Smoker.  5.  Marijuana use.  6.  Depression.  7.  Osteoarthritis.  8.  Remote history of peptic ulcer disease.  9.  Left bundle branch block.  10. Cardiomyopathy thought to be due to alcohol, although he says everybody      in his family has congestive heart failure.  11. Biventricular pacemaker.  12. Percutaneous  coronary intervention with direct coronary artery in 2000.  13. History of colonoscopy by Dr. Dorena Cookey in 2002, which showed      diverticulosis, but no other abnormalities.   MEDICATIONS ON ADMISSION:  1.  Prevacid 30 mg q.d.  2.  Zocor 40 mg q.h.s.  3.  Coreg 25 mg b.i.d.  4.  Accupril 40 mg q.d.  5.  Lasix 20 mg q.d.  6.  Flonase p.r.n.  7.  Zyrtec p.r.n.  8.  Potassium.  9.  Enteric coated aspirin 325 mg q.d.  10. Centrum Silver.   SOCIAL HISTORY:  Lives in [place] by himself.  He is disabled and  divorced with four children.  He is active using a tiller and works out in  the yard.  He continues to  smoke a pack per day.  He drinks three to four  beers per day.  He uses marijuana about twice a week to help his appetite.   FAMILY HISTORY:  Mother died of natural causes at 42.  Father died in his  27s of congestive heart failure.  Otherwise, as above with no colon cancer.   REVIEW OF SYSTEMS:  As above.  All other systems appear negative at this  time.   PHYSICAL EXAMINATION:  GENERAL:  A pleasant, well-developed, somewhat  chronically, ill-appearing, white man looking slightly older than his stated  age.  VITAL SIGNS:  Temperature 98.2, blood pressure 130/60, pulse 92,  respirations 20.  HEENT:  Eyes are anicteric.  NECK:  Supple with no thyromegaly or masses.  CHEST:  Clear.  HEART:  S1, S2.  I hear no murmurs, rubs or gallops.  There is no jugular  venous distention.  ABDOMEN:  Soft, nontender except in the left mid and left lower quadrant.  There is no rebound, but there is focal tenderness there.  No mass.  No  hepatomegaly appreciated.  EXTREMITIES:  No peripheral edema.  No clubbing, cyanosis or edema.  NEUROLOGIC:  Alert and oriented x3.  Cranial nerves 2-12 grossly intact.  LYMPHS:  Lymph nodes with neck or supraclavicular nodes.   LABORATORY DATA AND X-RAY FINDINGS:  White count 15,000 on admission and the  remainder of the CBC was normal.  CMET was  unremarkable.  He has normal  amylase and lipase.   ASSESSMENT:  1.  Acute diverticulitis.  2.  Recent syncope, questionably related to pain and possible vagal reaction      from the diverticulitis.  3.  Multiple other problems as outlined above.  He is a chronic drinker,      although he has normal function and no clinical evidence of cirrhosis.  4.  Prior colonoscopy in 2002, showing diverticulosis.   RECOMMENDATIONS:  1.  Start ciprofloxacin and metronidazole at 500 mg b.i.d. and 500 mg t.i.d.      to be taken x10 days to treat diverticulitis.  2.  He needs to follow up with Mr. Zenda Alpers or myself in approximately 2      weeks to check response to this.  We      will give him the option.  3.  I do not think there is any obvious indication for repeat colonoscopic      investigation at this time.  He should certainly follow through with      routine colon cancer screening.                                               Iva Boop, M.D. [facility]    CEG/MEDQ  D:  02/29/2004  T:  02/29/2004  Job:  [id number]   cc:   Montey Hora, P.A.-C [facility]   Rollene Rotunda, M.D.

## 2010-11-05 NOTE — Op Note (Signed)
NAME:  [name], [name]                        ACCOUNT NO.:  [id number]   MEDICAL RECORD NO.:  [id number]                   PATIENT TYPE:  OIB   LOCATION:  3729                                 FACILITY:  [facility]   PHYSICIAN:  Doylene Canning. Ladona Ridgel, M.D. [facility]           DATE OF BIRTH:  Mar 14, 1941   DATE OF PROCEDURE:  08/30/2002  DATE OF DISCHARGE:                                 OPERATIVE REPORT   PROCEDURE:  Implantation of a dual-chamber bi-ventricular defibrillator in  the setting of cardiomyopathy, ejection fraction of 10%, class III heart  failure, and left bundle branch block.   INTRODUCTION:  The patient is a very pleasant middle-aged man with a long  history of congestive heart failure.  While he does have coronary disease,  the etiology of his cardiomyopathy is thought to be nonischemic.  He has a  history of nonsustained VT.  He has a left bundle branch block with severe  LV dysfunction and class III heart failure.  He is now referred for bi-V ICD  insertion (COMPANION, DEFINITE, and SCUDHEFT data and trials).   DESCRIPTION OF PROCEDURE:  After informed consent was obtained, the patient  was taken to the diagnostic EP lab in a fasted state.  After the usual  preparation and draping, intravenous fentanyl and midazolam were given for  sedation.  A total of 30 mL of lidocaine was infiltrated into the left  infraclavicular region.  A 9 cm incision was carried out over this region  and electrocautery utilized to dissect down to the subpectoralis fascia.  Ten cubic centimeters of contrast demonstrated a patent left subclavian  vein.  It was subsequently punctured and the Guidant model [id number]  defibrillation lead was advanced into the right ventricle.  The Guidant  model [id number] bipolar pacing lead was placed in the right atrium, and  the guiding catheter was advanced into the right atrium.  Initially mapping  was carried out in the right ventricle and at the final site where the  lead  was actively fixed, R-waves measured 22 millivolts with a pacing threshold  of 0.8 volts at 0.5 msec and a pacing impedance of 620 Ohms.  Ten-volt  pacing in the ventricle did not result in diaphragmatic stimulation.  With  the right ventricular lead in satisfactory position, attention was turned to  the atrial lead.  The right atrial lead was placed in the lateral wall of  the right atrium, where P-waves measured 2.2 millivolts and the pacing  threshold was 1 volt at 0.6 msec with a pacing impedance of 615 Ohms once  the lead was actively fixed.  Next attention was turned to coronary sinus  catheterization.  This was done utilizing a Josephson hexapolar CS catheter  through the guiding catheter.  This was done without difficulty.  Venography  in the RAO and LAO projection was taken utilizing a balloon-tipped catheter  placed in the coronary sinus.  This demonstrated  a nice lateral vein.  The  lateral vein was selectively cannulated and the Guidant LV pacing lead,  model [id number], was advanced out into the posterior lateral left  ventricle.  At the final site, the LV waves measured between 15 and 20  millivolts.  Ten-volt pacing did not result in diaphragmatic stimulation.  The pacing threshold was 2 volts at 0.5 msec.  The pacing impedance in the  left ventricle was 667 Ohms.  With all three leads in satisfactory position,  they were secured to the subpectoralis fascia with a figure-of-eight silk  suture.  In addition, the sewing sleeves were secured with silk suture.  Electrocautery was then utilized to make the subcutaneous pocket.  The LV  lead guiding catheter and sheath were removed under fluoroscopic guidance.  At this point the [facility] III, model H175, serial number  [id number], biventricular dual-chamber defibrillator was connected to the right  atrial, right ventricle, and left ventricular leads and placed back in the  subcutaneous pocket.  A silk suture was  utilized to secure the generator.  Additional kanamycin was utilized to irrigate the wound and defibrillation  threshold testing carried out.   After the patient was more deeply sedated with Valium and fentanyl (Versed  did not help in sedation), VF was induced with a T-wave shock.  A 14-joule  shock was delivered, which failed to restore sinus rhythm.  A 21-joule shock  terminated ventricular fibrillation, restoring sinus rhythm.  Five minutes  were allowed to elapse and a second DFT test carried out.  Again VF was  induced with a T-wave shock and a 21-joule shock terminated ventricular  fibrillation, restoring sinus rhythm.  No additional DFT testing was carried  out, and the incision was closed with a layer of 2-0 Vicryl, followed by a  layer of 3-0 Vicryl, followed by a layer of 4-0 Vicryl.  Benzoin was painted  on the skin and Steri-Strips were applied and a pressure dressing placed,  and the patient returned to his room in satisfactory condition.   COMPLICATIONS:  There were no immediate procedural complications.   RESULTS:  This demonstrates successful insertion of a Guidant dual-chamber  bi-ventricular ICD without any immediate procedural complications.                                                Doylene Canning. Ladona Ridgel, M.D. [facility]    GWT/MEDQ  D:  08/30/2002  T:  08/31/2002  Job:  [id number]   cc:   Rollene Rotunda, M.D. [facility]   [facility] [place].A.

## 2010-11-05 NOTE — H&P (Signed)
[place]. [facility]  Patient:    [name], [name] Visit Number: [id number] MRN: [id number]          Service Type: MED Location: 2000 2011 01 Attending Physician:  Talitha Givens Dictated by:   Delton See, P.A. Admit Date:  09/23/2001   CC:         [facility], [address], [address], [place], [place] [address]   History and Physical  DATE OF BIRTH: [birth year]  CHIEF COMPLAINT: Chest pain.  HISTORY OF PRESENT ILLNESS: This is a 70 year old male, transferred to [facility] from [facility] Room for evaluation of chest pain, with a known history of coronary artery disease.  The patient states that he was at [facility] approximately a week ago for prostatitis and on physical examination his pulse was noted to be irregular.  He had an EKG that was abnormal, according to the patient.  He was instructed to see Dr. Antoine Poche and saw him in the office last Friday.  Dr. Antoine Poche also noted that the EKG was irregular, although we do not have those records at this time.  The patient was scheduled for an echocardiogram to be performed this coming Tuesday.  The patient states his heart has been beating too fast.  At approximately 4 a.m. this morning the patient awoke with chest discomfort and shortness of breath.  He feels the shortness of breath was worse than the chest discomfort.  The pain did increase with inspiration.  He described the pain as being mild, different from his previous cardiac pain.  It lasted several hours.  It finally resolved in the emergency room at [facility] after he had received several nitroglycerin, although the patient cannot say for certain that the nitroglycerin actually took his pain away.  He was transferred to [facility] for further evaluation.  He is pain-free at this time.  PAST MEDICAL  HISTORY:  1. PTCA of the RCA in January 2000.  The lesion was initially 80% and was     reduced to 10%.  He had a residual 25% LAD as well as proximal 30% RCA.     His ejection fraction was 65% at that time.  The patient had presented     with chest pain and wide complex tachycardia.     a. He was hyperkalemic at that time.  2. History of elevated lipids.  3. Remote peptic ulcer disease.  4. Remote alcohol and tobacco use.  SOCIAL HISTORY: [place]  He is divorced.  He has four children.  He had quit smoking.  He recently started smoking again, and smokes one pack per day.  He drinks alcohol occasionally. 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  ALLERGIES: No known drug allergies.  CURRENT MEDICATIONS (The patient is unsure of the dosages of his medications):  1. Zocor (he believes 40 mg).  2. Accupril (he believes may be 10 mg).  3. Zoloft.  4. Celebrex.  5. Aspirin q.d.  FAMILY HISTORY: The patients mother died at age 29 from natural causes.  His father died in his 61s of congestive heart failure.  He has two sisters with heart trouble.  Two brothers with heart trouble, one who had coronary artery bypass graft surgery at an early age.  REVIEW OF SYSTEMS: Essentially negative except for the following -  The patient has noted that he has been sweating a lot recently.  He has also had some weight loss.  He bruises easily.  He has had chest pain, shortness of breath, dyspnea on exertion, and palpitations as described above.  He recently had prostatitis.  He has a history of anxiety.  There is no history of CVA or seizure.  He has some arthritis.  He has cold intolerance.  PHYSICAL EXAMINATION:  GENERAL: Pleasant 70 year old white male, in no acute distress.  VITAL SIGNS: Blood pressure 130/80, pulse 108, temperature 97.7 degrees.  HEENT: Unremarkable.  NECK: No bruits, no jugular venous distention.  HEART:  Regular rate and rhythm without murmur.  ABDOMEN: Soft, nontender.  CHEST: Lungs clear.  SKIN: Warm and dry.  NEUROLOGIC: Grossly intact.  EXTREMITIES: Pulses intact without edema.  LABORATORY DATA: EKG shows left bundle branch block, rate 113 beats per minute with Qs in III and aVF and poor R wave progression.  Chemistries reveal a BUN of 20, creatinine 0.9, glucose 149, potassium 4.6. CK-MB enzymes reveal a CK of 125, MB 3.7, index 2.9, troponin I 0.04.  INR 1.1.  PTT 26.9.  CBC hemoglobin 13.4, hematocrit 39.2, WBC 17,000; platelets 326,000.  Chest x-ray was performed at [facility] and has not been read.  IMPRESSION:  1. Dyspnea.  2. Chest pain.  3. History of coronary artery disease, status post percutaneous transluminal     coronary angioplasty of the right coronary artery on July 20, 1998.  4. History of nonsustained ventricular tachycardia.  5. New left bundle branch block.  6. Tobacco history.  Patient recently resumed smoking.  He probably has     chronic obstructive pulmonary disease.  7. Elevated lipids.  8. History of anxiety.  9. Remote peptic ulcer disease.  PLAN:  1. We will admit the patient for rule out MI.  2. Consider chest CT, recatheterization, 2D echocardiogram, and check a     D-dimer.  3. Will also check his TSH. Dictated by:   Delton See, P.A. Attending Physician:  Talitha Givens DD:  09/23/01 TD:  09/24/01 Job: [id number]

## 2010-11-05 NOTE — H&P (Signed)
NAME:  [name], [name]                        ACCOUNT NO.:  [id number]   MEDICAL RECORD NO.:  [id number]                   PATIENT TYPE:  IPS   LOCATION:  0301                                 FACILITY:  [facility]   PHYSICIAN:  Landry Corporal, N.P.                 DATE OF BIRTH:  Sep 26, 1940   DATE OF ADMISSION:  09/24/2002  DATE OF DISCHARGE:                         PSYCHIATRIC ADMISSION ASSESSMENT   IDENTIFYING INFORMATION:  The patient is a 70 year old divorced white male  voluntarily admitted on September 24, 2002.   HISTORY OF PRESENT ILLNESS:  The patient presents with a history of alcohol  abuse.  He has been drinking beer and wine and now drinking liquor up to a  pint per day.  He relapsed about a year ago after being sober for 13 years  with his last drink on September 23, 2002.  His family has encouraged him to seek  assistance to be detoxified.  The patient reports he has been having  nightmares at home with increased drinking.  He has been having difficulty  sleeping and states his drinking helps him get to sleep.  His appetite has  been satisfactory.  He denies any depression, anxiety, suicidal or homicidal  ideation or psychosis.  The patient is motivated to remain sober and will  attend AA meetings as he did in the past.  He reports no significant  stressors at home.   PAST PSYCHIATRIC HISTORY:  This is the first hospitalization at [facility].  He was detoxified before.  No history of outpatient  treatment.  Longest history of sobriety has been 13 years from 60 to 2003  with no history of a suicide attempt in the past.   SUBSTANCE ABUSE HISTORY:  The patient smokes.  Drinking is as stated above.  No drug use.   PAST MEDICAL HISTORY:  Primary care Krista Som: The patient sees a physician's  assistant, Montey Hora, at [facility].  Medical problems:  Congestive heart failure; he had pacemaker inserted three weeks ago.   MEDICATIONS:  1. Zoloft 50 mg  daily; has been on that for three years.  2. Accupril 20 mg q.a.m.  3. K-Dur 20 mg two in the morning.  4. Zocor 40 mg daily.  5. Coreg 25 mg b.i.d.   DRUG ALLERGIES:  No known allergies.   PHYSICAL EXAMINATION:  GENERAL:  The patient presented to [facility]  to be detoxified.  He was assessed there.  The patient appears today  somewhat anxious, appears in no acute distress.  VITAL SIGNS:  Temperature 98.3, heart rate 80, respirations 20, blood  pressure 141/87.  The patient is 5 feet 8 inches tall.  He is 128 pounds.  CHEST:  His chest is clear to auscultation.  CARDIOVASCULAR:  Pacemaker rhythm.  Heart rate is regular.  SKIN:  Skin is warm and dry.   LABORATORY DATA:  Alcohol level is 283.  Urine drug screen is negative.  RDW  is slightly elevated at 14.6.  Urinalysis is negative.  PT 11.1, INR 0.9.  Alkaline phosphatase is elevated at 138.  Sodium 132, potassium 3.6.   SOCIAL HISTORY:  He is a 69 year old divorced white male, divorced for 12  years.  He has four children, all adults.  He live with his 47 year old who  will be going into the [employer] soon.  No legal problems.  He is on short-term  disability for his heart problems.  He has completed some courses in  community college.   FAMILY HISTORY:  He states his parents have a history of alcoholism.   MENTAL STATUS EXAM:  Alert, middle-aged male.  He is cooperative with good  eye contact.  His speech is clear.  Mood is anxious.  Affect: Some mild  anxiety noted.  Thought processes are coherent.  There is no evidence of  psychosis, no auditory and visual hallucinations, no suicidal or homicidal  ideation or paranoia.  Cognitive: Intact.  Memory is good.  Judgment is  fair.  Insight is fair.   ADMISSION DIAGNOSES:   AXIS I:  1. Alcohol dependence.  2. Depression, not otherwise specified.   AXIS II:  None.   AXIS III:  Congestive heart failure, status post pacemaker.   AXIS IV:  Other psychosocial problems,  medical problems.   AXIS V:  Current is 45, this past year is 91.   INITIAL PLAN OF CARE:  Plan is a voluntary admission to [facility] for alcohol dependence.  Contract for safety, check every 15 minutes.  Will initiate the phenobarbital protocol to detoxify safely, encourage  fluids.  Will resume his medications.  The patient is to remain sober,  attend AA, consider the [facility].   ESTIMATED LENGTH OF STAY:  Three to four days.                                               Landry Corporal, N.P.    JO/MEDQ  D:  09/24/2002  T:  09/24/2002  Job:  [id number]

## 2010-11-05 NOTE — Discharge Summary (Signed)
NAME:  [name], [name]                        ACCOUNT NO.:  [id number]   MEDICAL RECORD NO.:  [id number]                   PATIENT TYPE:  IPS   LOCATION:  0301                                 [facility]ACILITY[facility]  [facility]   PHYSICIAN:  Jeanice Lim, M.D.              DATE OF BIRTH:  04-30-1941   DATE OF ADMISSION:  09/24/2002  DATE OF DISCHARGE:  09/28/2002                                 DISCHARGE SUMMARY   IDENTIFYING INFORMATION:  This is a 70 year old divorced Caucasian  male  voluntarily admitted, presenting with a history of alcohol abuse, drinking  wine and liquor up to a pint per day. He relapsed about a year ago after  being sober for 13 years. Describing nightmares but no other psychiatric  symptoms.   MEDICATIONS:  1. Zoloft the patient had on in the past, 50 mg every day.  2. Accupril 20 mg p.o. every day.  3. K-Dur 20 mEq q. a.m.  4. Zocor 40 mg every day.  5. Coreg 25 mg b.i.d.   ALLERGIES:  No known drug allergies.   PHYSICAL EXAMINATION:  Performed at [facility]. Essentially within  normal limits. Neurologically nonfocal. Vital signs stable, afebrile.   LABORATORY DATA:  Routine admission labs, alcohol level was 283, quite  elevated. Urine drug screen negative. Urinalysis negative. CMET within  normal limits. CBC essentially within normal limits.   MENTAL STATUS EXAM:  Alert, middle-aged, cooperative male with good eye  contact. Speech clear. Mood anxious. Affect restricted, somewhat dysphoric.  Thought process goal directed. Thought content negative for dangerous  ideation or psychotic symptoms. The patient reported motivation to become  abstinent after detox and was cognitively intact. Judgment and insight fair.   ADMISSION DIAGNOSES:   AXIS I:  Alcohol dependence and depression not otherwise specified.   AXIS II:  None.   AXIS III:  Congestive heart failure, status post pacemaker placement.   AXIS IV:  Moderate problems with psychosocial issues. No  other medical  problems.   AXIS V:  45/70.   HOSPITAL COURSE:  The patient was admitted and we ordered routine  p.r.n.  medications. He underwent further monitoring. He was encouraged to  participate in individual, group and milieu therapy. The patient was placed  on a phenobarbital detox protocol for safe withdrawal.   The [facility] was contacted to confirm medications and doses and compliance. The  patient required p.r.n. phenobarbital on top of protocol for breakthrough  withdrawal symptoms and ReVia was started for  longer-term abstinence for  alcohol. Zyrtec was used for allergies.   The patient participated in a family meeting with his son and daughter for  aftercare planning and in substance abuse  counseling. The patient reported  motivation to be compliant with the aftercare plan.   The patient was discharged in improved condition. His mood was more  euthymic, affect brighter, thought process is goal directed, thought content  negative for any  dangerous ideation or psychotic symptoms. The patient  reported motivation to be compliant with the aftercare plan and remain  sober, appearing motivated.   DISCHARGE MEDICATIONS:  1. Prinivil 20 mg p.o. every day.  2. Zocor 40 mg every day.  3. K-Dur 20 mEq b.i.d.  4. Coreg 25 mg b.i.d.  5. Zoloft 50 mg 1-1/2 every day.  6. ReVia 50 mg every day.  7. Claritin 10 mg every day versus Zyrtec.  8. Flonase spray as directed.  9. Trazodone 50 mg p.o. q.h.s. p.r.n.  insomnia.   FOLLOW UP:  The patient was  to follow up with AA and Montey Hora. He is to  follow up with the [facility] and avoid alcohol.   DISCHARGE DIAGNOSES:   AXIS I:  Alcohol dependence and depression not otherwise specified.   AXIS II:  None.   AXIS III:  Congestive heart failure, status post pacemaker placement.   AXIS IV:  Moderate problems with psychosocial issues. No other medical  problems.   AXIS V:  Global assessment of function on discharge  is 55.                                               Jeanice Lim, M.D.    JEM/MEDQ  D:  10/16/2002  T:  10/17/2002  Job:  [id number]

## 2010-11-05 NOTE — Discharge Summary (Signed)
[place]. [facility]  Patient:    [name], [name] Visit Number: [id number] MRN: [id number]          Service Type: MED Location: 2000 2011 01 Attending Physician:  Talitha Givens Dictated by:   Brita Romp, P.A. Admit Date:  09/23/2001 Discharge Date: 09/26/2001   CC:         [facility]  Rollene Rotunda, M.D. [facility]   Discharge Summary  DISCHARGE DIAGNOSES: 1. Non-ischemic cardiomyopathy, ejection fraction approximately 25%. 2. Coronary artery disease. 3. Hyperlipidemia. 4. Remote history of peptic ulcer disease. 5. Remote history of alcohol and tobacco use.  HOSPITAL COURSE:  [name] is a pleasant 70 year old male who was transferred to [facility] from [facility] for evaluation of chest pain.  The patient was seen and admitted by Dr. Willa Rough.  He had planned to rule out the patient for myocardial infarction with enzymes.  He also planned to have the patient undergo a relook cardiac catheterization.  On April 7, the patient underwent a 2-D echocardiogram.  Left ventricular systolic function was severely reduced with an ejection fraction of 10-20%. There was severe, diffuse left ventricular hypokinesis.  Left nuclear wall thickness was mildly increased.  There was mild mitral valvular regurgitation. The left atrium was mildly dilated.  The estimated peak pulmonary artery systolic pressure was mildly increased.  There was also a small pericardial effusion.  The patient was then taken to the cath lab by Dr. Charlies Constable. Catheterization revealed nonobstructive coronary artery disease with an ejection fraction of approximately 25%.  Left ventricular end diastolic pressure was 35.  Dr. Juanda Chance felt that the patients cardiomyopathy was likely non-ischemic in nature and recommended aggressive medical therapy.  The following day, the patient was doing well and had no complaints.  Dr. Antoine Poche noted that  the patient had marked jugular venous distension at 13 cm at a 45 angle.  He started the patient on diuresis and ordered a BMP.  He planned to start Coreg on an outpatient basis after the patient euvolemic.  The patient was also screened for the [facility] trial.  This trial is to evaluate the efficacy of aspirin versus Coumadin in patients with reduced ejection fraction.  The following morning, the patient was seen by Dr. Antoine Poche who felt that the patient was overall doing well and ready for discharge.  DISCHARGE MEDICATIONS: 1. Enteric-coated aspirin 325 mg q.d. 2. Zoloft 25 mg q.d. 3. Accupril 10 mg q.d. 4. Lasix 40 mg b.i.d. for three days, resuming Accupril 10 mg q.d. and Lasix    40 mg b.i.d. for three days, followed by 40 mg q.d. 5. Prevacid 30 mg q.d. 6. K-Dur 40 mEq q.d. 7. Zocor 40 mg q.h.s.  LABORATORY VALUES:  Sodium 131, potassium 4.0, chloride 96, CO2 27, BUN 18, creatinine 0.9, glucose 106.  BMP greater than 1300.  White count 7.5, hemoglobin 11.8, hematocrit 34.4, platelets 278, total cholesterol 164, triglycerides 71.  HDL 69, LDL 81, total cholesterol HDL ratio 2.4.  TSH 1.848. Dictated by:   Brita Romp, P.A. Attending Physician:  Talitha Givens DD:  09/26/01 TD:  09/27/01 Job: [id number]

## 2010-11-05 NOTE — Op Note (Signed)
NAME:  LABIB, CWYNAR                        ACCOUNT NO.:  [id number]   MEDICAL RECORD NO.:  [id number]                   PATIENT TYPE:  OIB   LOCATION:  2899                                 FACILITY:  [facility]   PHYSICIAN:  Harvie Junior, M.D.                DATE OF BIRTH:  10/23/1940   DATE OF PROCEDURE:  02/08/2002  DATE OF DISCHARGE:  02/08/2002                                 OPERATIVE REPORT   PREOPERATIVE DIAGNOSIS:  Impingement with acromioclavicular joint arthritis.   POSTOPERATIVE DIAGNOSES:  1. Impingement with acromioclavicular joint arthritis.  2. Tear of the biceps tendon.   PROCEDURES:  1. Anterolateral acromioplasty.  2. Distal clavicle excision.  3. Debridement of biceps tendon from within the glenohumeral joint.   SURGEON:  Harvie Junior, M.D.   ASSISTANT:  Currie Paris. Thedore Mins.   ANESTHESIA:  General.   BRIEF HISTORY:  The patient is a 70 year old male with a long history of  having left shoulder pain.  He ultimately had undergone MRI, which showed  that he had some questionable biceps subluxation.  There was some concern of  supraspinatus inflammation and fray.  He continued to have pain and because  of this was ultimately brought to the operating room for evaluation under  anesthesia and arthroscopy.   DESCRIPTION OF PROCEDURE:  The patient was brought to the operating room and  after adequate anesthesia was obtained with a general anesthetic, the  patient was placed supine upon the operating table.  The left shoulder was  placed in the beach chair position, and all bony prominences were well-  padded.  The left shoulder was then examined under anesthesia and noted to  be stable in all directions.  Attention was turned to routine prepping and  draping, which was performed, and then routine arthroscopic examination  showed that there was an obvious tear of the biceps tendon within the  glenohumeral joint, completely torn, but there was flapping in  the joint.  At this point the suction shaver was used to debride this back to the rim to  avoid any flapping in this area.  Undersurface of the rotator cuff was then  identified and noted to have fairly significant degenerative changes.  There  was no evidence of full-thickness tear, but there was some degenerative  change.  This was debrided from within the glenohumeral joint.  At this  point attention was turned out of the glenohumeral joint and into the  subacromial space.  The anterior spur was carefully identified, and this was  debrided with a motorized bur by way of an anterolateral acromioplasty.  The  distal clavicle was then identified, and 15 mm of the distal clavicle was  excised from the anterior portal.  At this point the camera was then placed  into the lateral compartment, and the lateral acromion was raised from the  posterior portal with a bur.  At  this point the camera was turned back  posteriorly and the shaver was used to take all of the bursal tissue off of  the superior rotator cuff as well as removing any bony fragments.  At this  point a check was made thoroughly of the rotator cuff.  No full-thickness  tearing could be identified from the superior bursal  surface.  The patient at this point had his shoulder copiously irrigated and  suctioned dry.  Then a sterile compressive dressing was applied and he was  taken to the recovery room, where he was noted to be in satisfactory  condition.  Estimated blood loss for the procedure was none.                                                Harvie Junior, M.D.    Ranae Plumber  D:  02/08/2002  T:  02/12/2002  Job:  [id number]   cc:   Dr. Montey Hora, [place]. Pract.

## 2010-11-05 NOTE — H&P (Signed)
NAME:  [name], [name]                        ACCOUNT NO.:  [id number]   MEDICAL RECORD NO.:  [id number]                   PATIENT TYPE:  INP   LOCATION:  3735                                 FACILITY:  [facility]   PHYSICIAN:  Joellyn Rued, P.A. [facility]              DATE OF BIRTH:  June 11, 1941   DATE OF ADMISSION:  08/08/2002  DATE OF DISCHARGE:                                HISTORY & PHYSICAL   PRIMARY CARE PHYSICIAN:  [facility].   CARDIOLOGIST:  Rollene Rotunda, M.D. [facility]   HISTORY:  The patient is a 70 year old white male who presented to [facility] emergency room complaining of shortness of breath.  He describes an  increased history of dyspnea on exertion for at least the past month.  In  fact, he saw Dr. Antoine Poche in the [facility] office yesterday.  He stated that  he did not really discuss this with him, but Dr. Antoine Poche did recommend to  him investigating disability, and the possibility of a pacemaker.  He is  scheduled to see Dr. Lanna Poche on March 3.  He stated today that his shortness  of breath seemed to be worse, especially in the late afternoon after work.  When he arrived home, he sat down, but he felt very weak, sweaty, nauseated,  and felt like he was going to pass out.  He has also noted upper abdominal  bloating x1 week.  He has not had any problems with chest discomfort,  palpitations, syncope, or lower extremity edema.  He did not some symptoms  that were similar to orthopnea and PND last week.  He is not sure if he has  gained any weight.  He does not weight daily, but he does not feel like his  clothes have become tighter.  He called EMS around 4:30.  EMS showed vital  signs of 136/98.  He is tachycardic at 111.  Respirations were 20,  saturations 97% on room air.  He was placed on four liters, and he received  two sublingual nitroglycerin without improvement.  Since in the emergency  room, he has continued to be short of breath.  In the emergency room,  chest  x-ray showed mild congestive heart failure and cardiomegaly.   His EKG showed normal sinus rhythm with a rate of 97, left axis deviation,  left bundle  branch block, LVH does not appear to have changed from 12/03.   H&H is 10.9 and 32.0.  Normal indices.  Platelets 305.  WBC's 8.6.  Sodium  135, potassium 3.8, BUN 14, creatinine 1.0, glucose 95.  LFT's were within  normal limits.  CK's and troponin were negative.  PTT 30, PT 13.8.   ALLERGIES:  No known drug allergies.   CURRENT MEDICATIONS:  1. Coreg 25 mg b.i.d.  2. Accupril 20 mg every day  3. Zoloft 50 mg every day  4. Lasix 40 mg every day  5.  Prevacid 30 mg every day  6. Zocor 40 mg q.h.s.  7. Aspirin 325 every day   PAST MEDICAL HISTORY:  His history is notable for hyperlipidemia.  He is not  sure when the last check was.  Remote peptic ulcer disease.  Nonsustained V-  tach, chronic left bundle branch block since April 2003.  He also has a  known angioplasty to the RCA in January of 2003.  His EF at that time was  65%.  He presented in April, 2003, with shortness of breath, and was found  to have an EF of 10-20% with severe, diffuse hyperkinesis, mild MR, left  atrial enlargement, and mild elevated PAP on echocardiogram.  He underwent  cardiac catheterization, and this showed a 30% proximal circumflex, 30%  proximal mid and distal RCA.  His EF was 25% by catheterization.  His  surgical history is notable for left shoulder surgery.  He denies any  history of hypertension, diabetes, chronic obstructive pulmonary disease,  cerebrovascular accident, bleeding or thyroid dysfunction.   SOCIAL HISTORY:  He resides in [place] with his son.  He is currently  working for [employer], and he is starting to investigate disability.  He is divorced.  He has two daughters, two sons alive and well, and possibly  eight grandchildren.  He continues to smoke approximately one pack per day.  He has been doing so for four  years.  He also has three mixed drinks of  Whiskey and Sun drop every night.  He denies any drugs or herbal  medications.  He does not follow a diet or exercise program.  He does not  weight daily.   FAMILY HISTORY:  His mother died at the age of 9 from old age.  His father  in his 80's from congestive heart failure.  He has two sisters and one  brother who also have heart problems.   REVIEW OF SYSTEMS:  Notable for chills last week.  Reading glasses.  Upper  and lower dentures.  Wheezing particularly at night.  Occasional  constipation and symptoms described in the HPI.   PHYSICAL EXAMINATION:  VITAL SIGNS:  Temperature 98.3, pulse 96 and regular.  Respirations 20.  Blood pressure 143/90.  Saturations 93% on room air.  GENERAL:  He appears older than stated age.  HEENT:  Normocephalic, atraumatic.  PERRLA.  EOM's intact.  NECK:  Supple without thyromegaly, bruits or slight JVD.  No  lymphadenopathy.  HEART:  Rapid Regular rate and rhythm.  Positive S3 and S4.  No murmurs are  appreciated.  LUNGS:  Symmetrical excursion.  Decreased breath sounds throughout.  He had  inspiratory and expiratory wheezing, and coarse basilar breath sounds.  SKIN:  Without lesions.  ABDOMEN:  Soft without tenderness.  No organomegaly or masses.  EXTREMITIES:  Negative for cyanosis, clubbing or edema.  Peripheral pulses  are intact.  MUSCULOSKELETAL:  Intact.  NEUROLOGIC:  Intact.   IMPRESSION:  1. Dyspnea on exertion with a dilated cardiomyopathy with minimal evidence     of congestive heart failure.  He does not appear to be totally volume     overloaded.  2. Hypertension.  3. Normocytic anemia.  4. History of angioplasty to the right coronary artery in 2000 with a normal     ejection fraction at that time.  5. Chronic left bundle branch block.  6. History of nonsustained ventricular tachycardia.  7. Hyperlipidemia. 8. Alcohol and tobacco use.   DISPOSITION:  1. He will be admitted  in the  emergency room.  We will give him 80 mg of IV     Lasix to assess his response.  We     will follow his renal function as well as check a TSH.  2. Dr.  Rollene Rotunda will reevaluate in the morning.   NOTE:  I have called the office for his old medical records, and they will  be faxed to Tricia's office.                                               Joellyn Rued, P.A. [facility]    EW/MEDQ  D:  08/08/2002  T:  08/09/2002  Job:  [id number]

## 2010-11-05 NOTE — H&P (Signed)
NAME:  [name], [name]                        ACCOUNT NO.:  [id number]   MEDICAL RECORD NO.:  [id number]                   PATIENT TYPE:  INP   LOCATION:  1826                                 FACILITY:  [facility]   PHYSICIAN:  Charlton Haws, M.D. [facility]              DATE OF BIRTH:  [birth year]   DATE OF ADMISSION:  05/20/2002  DATE OF DISCHARGE:                                HISTORY & PHYSICAL   REASON FOR ADMISSION:  Heart failure.   HISTORY OF PRESENT ILLNESS:  The patient is a 70 year old patient of Rollene Rotunda, M.D., who was seen in family practice.  He has been ill since  Thursday.  He has had fever, chills, and malaise.  He has had increasing  shortness of breath.  He appears to have a URI.  He was seen at [facility] and on a Z-PAK.   He did not feel any better and began having some blood-tinged sputum.  He  was referred to the [facility] ER by [facility].  The patient has a known ischemic cardiomyopathy.  His last  catheterization in April of this year showed no critical coronary disease  with an EF of 25%.  An echocardiogram at the time showed severe diffuse  hypokinesis with an EF of 10-20%.   The patient is in the __________  trial for his cardiomyopathy and was  unblinded and was on Coumadin.  His INR was 5.5.   He does not admit to any excessive fluid intake.   REVIEW OF SYMPTOMS:  Otherwise remarkable for his shortness of breath and  malaise.   He has not had any presyncope or palpitations.   MEDICATIONS:  1. An aspirin a day.  2. Zoloft 25 mg a day.  3. Accupril 10 mg a day.  4. Lasix 40 mg a day.  5. Prevacid 30 mg a day.  6. K-Dur 40 mEq a day.  7. Zocor 40 mg a day.  8. Coumadin as directed.  9. Zithromax was started on Saturday.   ALLERGIES:  He has no known allergies.   PHYSICAL EXAMINATION:  GENERAL APPEARANCE:  He is a somewhat ill-appearing,  elderly man.  He is not  significantly tachypneic.  VITAL SIGNS:  His temperature was 99 degrees.  His pulse was 92 with a left  bundle branch block.  The blood pressure is in the 120/70 range.  LUNGS:  He has rhonchi, right greater than left.  It is not classic for  heart failure.  NECK:  JVP is mildly elevated.  HEART:  There is an S1 and S2 without significant murmur.  ABDOMEN:  Benign.  EXTREMITIES:  Intact pulses.  No edema.   LABORATORY DATA:  Remarkable for a white count of 6.8.  BNP greater than  3200.  Sodium 119.  INR 5.5.   The EKG shows sinus rhythm and chronic  left bundle branch block.  The chest  x-ray shows congestive heart failure with cardiomegaly and question of right  upper lobe infiltrate.   IMPRESSION:  The patient will be admitted.  We will stop his Coumadin.  I  suspect he has had a upper respiratory infection on top of his congestive  heart failure and the elevated INR is contributing to his blood-tinged  sputum.   I will have pulmonary see the patient in regards to continuing his Z-PAK or  broadening his antibiotics.   His presentation is not classic for congestive heart failure, but he  certainly has this.  We will increase his diuretics to Lasix 40 mg b.i.d.  We will watch his sodium closely and place him on a 1200 cc fluid  restriction.   Hopefully with the diuresis and fluid restriction his sodium will start to  rise.  Overall that in and of itself is a bad prognostic sign in regards to  his heart failure.  This coupled with his BNP of 3200 would also indicate  that it may take a few days for him to turn around.   The patient has a nonobstructive coronary disease by recent catheterization.  I think that adjustment of his medications and covering him with antibiotics  would be the most reasonable thing to do.   He is already taking an adult aspirin a day.  We will stop his Coumadin in  regards to his blood-tinged sputum.                                                Charlton Haws, M.D. [facility]    PN/MEDQ  D:  05/20/2002  T:  05/20/2002  Job:  [id number]

## 2010-11-05 NOTE — Discharge Summary (Signed)
   NAME:  [name], [name]                        ACCOUNT NO.:  [id number]   MEDICAL RECORD NO.:  [id number]                   PATIENT TYPE:  OIB   LOCATION:  3729                                 FACILITY:  [facility]   PHYSICIAN:  Doylene Canning. Ladona Ridgel, M.D. [facility]           DATE OF BIRTH:  11/21/1940   DATE OF ADMISSION:  08/30/2002  DATE OF DISCHARGE:  08/31/2002                           DISCHARGE SUMMARY - REFERRING   PROCEDURE:  Biventricular ICD implantation on 3/12.   REASON FOR ADMISSION:  Please refer to dictated admission note.   LABORATORY DATA:  WBC 9.2, HGB 12.5, platelets 306, INR 0.9, sodium 123,  potassium 4.9, glucose 97, BUN 15, creatinine 1.0.   HOSPITAL COURSE:  The patient presented for elective placement of a Guidant  biventricular defibrillator successfully placed by Dr. Lewayne Bunting on date  of admission with no noted complications.   The patient was kept for overnight observation and had no complications  overlying the wound site.  Interrogation of the device on morning of  discharge showed normally functioning parameters requiring no programming  adjustments.   The patient was discharged home on previous home medication regimen.  However, Dr. Andee Lineman noted that if the BNP remained elevated (greater than  3200 on 2/19), then the Lasix dose such be titrated.  BNP was added at time  of discharge, but still pending.   DISCHARGE MEDICATIONS:  1. Zocor 40 mg daily.  2. Zoloft 50 mg daily.  3. Lasix 40 mg daily.  4. Potassium 20 mEq daily.  5. Accupril 20 mg daily.  6. Coreg  25 mg b.i.d.  7. Aspirin 325 mg daily.   INSTRUCTIONS:  The patient was given a pacemaker discharge sheet and  instructed to refrain from driving for two weeks.   The patient is scheduled to return to the [facility] on 3/31 at  9:15 a.m.  The patient is scheduled to return for followup with Dr. Lewayne Bunting on 5/11 at 2:30.   The patient will be reschedule to follow up with Dr.  Antoine Poche in the ensuing  three-four weeks.   DISCHARGE DIAGNOSES:  1. Severe nonischemic cardiomyopathy.     a. Status post Guidant biventricular ICD implantation 3/12.     b. History of 10-20% ejection fraction.     c. Status post percutaneous coronary intervention right coronary artery        4/01.  2.     Chronic hyponatremia.  3. Dyslipidemia.  4. History of alcohol abuse.     Gene Serpe, P.A. [facility]                      Doylene Canning. Ladona Ridgel, M.D. [facility]    GS/MEDQ  D:  08/31/2002  T:  09/01/2002  Job:  [id number]   cc:   Montey Hora, PA-C  [facility]

## 2010-11-05 NOTE — Discharge Summary (Signed)
   NAME:  ISAMAR, WELLBROCK                        ACCOUNT NO.:  [id number]   MEDICAL RECORD NO.:  [id number]                   PATIENT TYPE:  INP   LOCATION:  3735                                 FACILITY:  [facility]   PHYSICIAN:  Dian Queen, P.A. [facility]               DATE OF BIRTH:  September 22, 1940   DATE OF ADMISSION:  08/08/2002  DATE OF DISCHARGE:                           DISCHARGE SUMMARY - REFERRING   HISTORY OF PRESENT ILLNESS:  Mr. Rathgeber is a 70 year old white male with  hypertension, hyperlipidemia, chronic ETOH and tobacco use, history of non-  sustained ventricular tachycardia, coronary artery disease with prior RCA  intervention in 2000. He has LV dysfunction with ejection fraction of 10-  20%, felt I believe to be related to ETOH. Admitted now with progressive  exertional dyspnea. His last catheterization in April of 2003, when his  ejection fraction was first noted to be significantly diminished. Then he  had a 30% proximal circumflex, 30% mid right. Ejection fraction at  catheterization was 25%. He was treated medically at that time. Lately, he  has been drinking more than usual.   LABORATORY DATA:  Electrolytes and liver function studies are normal. His  BNP on admission was 3,200. TSH was 1.559. Potassium 3.2, BUN 13 and  creatinine of 1.0. Hemoglobin with 10.9 with hematocrit of 32 and white  count of 8,600. CK MB's and troponin's were low.   HOSPITAL COURSE:  The patient was diuresed quickly and recovered. We  basically renewed his medications and did not really change much at the time  of discharge.   FINAL DIAGNOSES:  1. Right lower lobe inflated to cardiomyopathy. BNP 3,200 on admission. He     diuresed nicely and is doing well and is euvolemic at this time.  2. Controlled hypertension.  3. History of ETOH and cigarette use.  4. History of nonsustained ventricular tachycardia.  5. Hyperlipidemia, treated.   DISPOSITION:  The patient is discharged to home.   DISCHARGE MEDICATIONS:  1. Coreg 25 mg twice a day.  2. Accupril 20 mg each day.  3. Zoloft 50 mg each day.  4. Lasix 40 mg each day.  5. Prevacid 30 mg each day.  6. Zocor 40 mg at bedtime.  7. Aspirin one daily.  8. K-Dur 20 mEq each day.    FOLLOW UP:  With Dr. Antoine Poche in [place] in 2-3 weeks. He is told to  absolutely abstain from alcohol and also the importance of smoking  cessation.                                               Dian Queen, P.A. [facility]    BY/MEDQ  D:  08/10/2002  T:  08/10/2002  Job:  [id number]

## 2010-11-05 NOTE — Op Note (Signed)
[facility]  Patient:    [name], [name]                     MRN: [id number] Proc. Date: 11/08/00 Adm. Date:  [id number] Attending:  Louie Bun CC:         Monica Becton, M.D.   Operative Report  PROCEDURE:  Colonoscopy.  ENDOSCOPIST:  Everardo All. Madilyn Fireman, M.D.  INDICATIONS:  Heme-positive stools.  DESCRIPTION OF PROCEDURE:  The patient was placed in the left lateral decubitus position and placed on the pulse monitor with continuous low flow oxygen delivered by nasal cannula.  She was sedated with 70 mg of IV Demerol and 6 mg of IV Versed.  The Olympus video colonoscope was inserted into the rectum and advanced to the cecum, confirmed by transillumination of McBurneys point and visualization of the ileocecal valve and appendiceal orifice.  The prep was good.  The cecum, ascending, transverse and descending colon all appeared normal with no masses, polyps, diverticula or other mucosal abnormalities.  In the sigmoid colon there were seen several scattered diverticula, no other abnormalities.  The rectum appeared normal and retroflexed view of the anus revealed no obvious internal hemorrhoids. The colonoscope was then withdrawn, and the patient returned to the recovery room in stable condition.  He tolerated the procedure well.  There were no immediate complications.  IMPRESSION:  Diverticulosis, otherwise normal colonoscopy.DD:  11/08/00 TD:  11/09/00 Job: [id number]

## 2010-11-05 NOTE — Discharge Summary (Signed)
NAME:  [name], [name]                        ACCOUNT NO.:  [id number]   MEDICAL RECORD NO.:  [id number]                   PATIENT TYPE:  INP   LOCATION:  4705                                 FACILITY:  [facility]   PHYSICIAN:  Charlton Haws, M.D. [facility]              DATE OF BIRTH:  1940/12/15   DATE OF ADMISSION:  05/20/2002  DATE OF DISCHARGE:  05/21/2002                           DISCHARGE SUMMARY - REFERRING   SUMMARY OF HISTORY:  This is a 70 year old male with past medical history  significant for nonischemic cardiomyopathy, congestive heart failure and  nonobstructive coronary artery disease who presents with increasing  shortness of breath  and hemoptysis.  This patient  first noticed symptoms  of shortness of breath, body aches, fever and chills on Thursday.  He was  seen at [facility] Room on Saturday and diagnosed  with pneumonia and given a prescription for Zithromax.  The patient did take  Zithromax at home on Sunday.  However, his symptoms of shortness of breath  worsened and this is when the hemoptysis  began.  He is on Coumadin therapy  and his INR was elevated on 11/18.  He was told to stop taking this for two  days then resume.  His last INR was checked last week and was within normal  limits.  He is part of the W_________ trial for his cardiomyopathy.  His  last catheterization in 4/03 showed no critical coronary disease with an  ejection fraction of 25%.  An echocardiogram at that time showed severe  diffuse hypokinesis with an ejection fraction of 10 to 20%.  The patient  denies any excessive fluid intake recently.   PAST MEDICAL HISTORY:  1. Nonischemic cardiomyopathy.  Last catheterization 09/24/01 showed ejection     fraction of 25%, 30% stenosis of proximal circumflex.  The RCA with 30%     proximal, 30% mid and 30% distal stenosis.  An echo on 09/24/01 showed an     ejection fraction of 10 to 20% with severe diffuse left ventricular  hypokinesis.  Percutaneous transluminal coronary angioplasty of the right     coronary artery was done on 07/20/98.  At that time he had an ejection     fraction of 65%.  2. Hyperkalemia.  3. Dyslipidemia.  4. Remote history of PUD.  5. Congestive heart failure.  6. Tobacco abuse. The patient is a 35-pack year smoker.   LABORATORY DATA:  On admission the patient's CBC revealed a white count of  6.8, H&H of 11.3 and 33.1 with platelets of 201.  His basic metabolic panel  revealed a sodium decreased to 119, potassium of 4.3, chloride of 90, CO2 of  20, BUN 16, creatinine 0.9 and glucose of 122.  His total bilirubin was 0.9,  AST 28, ALT 15, alk phos 56, total protein was 6.5, and albumin was 3.1.  Her D-dimer was 0.28 and BNP was greater than  3200.  PT, INR revealed a PT  of 38.8 and an INR of 5.5.  CK was 106, MB was 2.3 and  troponin was 0.36.  Subsequently, on 05/21/02 his CBC revealed a white count of 5.6, H&H 12.3 and  35.7, platelets 199.  His INR was decreased to 4.8.  His basic metabolic  panel revealed a sodium increase to 127, potassium at 4.6.  Otherwise,  unremarkable.   EKG  shows sinus rhythm with first-degree AV block, left axis deviation and  chronic left bundle branch block. Chest x-ray shows cardiomegaly, congestive  heart failure with pulmonary effusions.   HOSPITAL COURSE:  The patient was admitted to 87 with a suspected upper  respiratory infection and congestive heart failure.  It was beli3ved that  the elevated INR was contributing to his hemoptysis.  Therefore, the  Coumadin was stopped.  For his congestive heart failure, his diuretics were  increased to Lasix 40 mg twice daily.  He was on  Lasix 40 mg q.d. at home.  He was also placed on a 1200 cc fluid restriction due to this hyponatremia.  In regards to his upper respiratory infection, pulmonary was asked to see  the patient concerning continuing his Zithromax or broadening his antibiotic  therapy.    Pulmonary saw the patient on 05/20/02 and felt that his hemoptysis was  secondary to his acute infection versus congestive heart failure with  increased capillary fracture aggravated by the elevated INR and agreed with  holding the Coumadin until the INR normalized.  It was felt that this was  pneumonia versus tracheal bronchitis.  Sputum was sent for culture.  The  results of this are still pending at discharge.  The patient was started on  Rocephin/Zithromax IV.   On 05/21/02, the patient reported shortness of breath improving.  INR down to  4.8 and sodium up to 127.  The  patient's symptoms improved with diuresis  and antibiotic therapy.  The patient would continue Lasix 40 mg b.i.d. today  and then change to 40 mg q.a.m. and 20 mg q.p.m.  He will hold the  W____________ study drugs until his PT is checked on Friday and then resume  at a lower dose.  It is also thought that he needs beta blocker for his  decreased LV function.  This can be started as an outpatient.  It was felt  that the increase in his troponin was most likely secondary to congestive  heart failure.  He has had no chest pain on this admission and his last  catheterization in 4/03 showed no critical coronary disease.   Pulmonary felt the patient had a upper respiratory infection with mild  tracheal bronchitis and the patient would be sent home on Zithromax p.o. for  the next three days.  A prescription was written for that.   DISCHARGE DIAGNOSES:  1. Congestive heart failure secondary to nonischemic cardiomyopathy.  2. Upper respiratory infection with mild tracheobronchitis.  3. Elevated INR on chronic Coumadin therapy.  4. Hyponatremia. Improving at discharge.  Felt to be delusional.  Responded     to congestive heart failure management.  5. Hyperlipidemia.  6. Tobacco abuse.  7. Nonobstructive coronary artery disease.  DISPOSITION:  The patient is being discharged home and asked to take the  following  medications. Azithromycin 500 mg q.d. x3 days, Lasix 40 mg  tonight, then 40 mg in a.m. and 20 mg in p.m.  Aspirin 325 mg q.d., Zoloft  25 mg q.d., Accupril 10  mg q.d., Prevacid 30 mg q.d.,  K-Dur 40 mEq q.d., Zocor 40 mg q.h.s.  The patient was also told not to take  W________ study drugs until his PT was checked on Friday.  Then, they would  be resumed at a lower dose.  The patient is to resume activity as tolerated.  His diet should be low salt, low fat.  Special instructions include no  smoking, also he is to go to the [place] office  about 9 a.m. on Friday 05/24/02 to have lab work done including a BMET and  PT.  He should followup with Dr. Antoine Poche on 06/05/02 at 4 p.m. at the  [place] office.  He is to follow up  with his primary doctor as needed or as  scheduled.  He is to call our office for any questions or concerns at [phone].     Amy Mercy Riding, P.A. [facility]                     Charlton Haws, M.D. [facility]    AB/MEDQ  D:  05/21/2002  T:  05/21/2002  Job:  [id number]   cc:   Rollene Rotunda, M.D. [facility]  [address]  [place]  [place] [address]  Fax: [phone]   [facility]

## 2010-11-05 NOTE — Cardiovascular Report (Signed)
[name]. [facility]  Patient:    [name], [name]                     MRN: [id number] Proc. Date: 11/19/99 Adm. Date:  [id number] Disc. Date: [id number] Attending:  Rollene Rotunda CC:         Monica Becton, M.D.                        Cardiac Catheterization  DATE OF BIRTH:  07/15/1940.  PROCEDURE:  Left heart catheterization/coronary arteriography.  INDICATIONS:  Evaluate patient with chest pain (786.51) and previous PTCA of an RCA.  PROCEDURAL NOTE:  Left heart catheterization was performed via the right femoral artery.  The artery was cannulated using an anterior wall puncture.  A 6-French arterial sheath was inserted via the modified Seldinger technique. Preformed Judkins and a pigtail catheter were utilized.  The patient tolerated the procedure well and left the lab in stable condition.  HEMODYNAMICS:  LV 144/8, AO 146/86.  Coronaries: 1. The left main was normal. 2. The LAD had a proximal 30% stenosis. 3. The circumflex was normal. 4. The right coronary artery was a large dominant vessel with a proximal 25%    stenosis.  There was mid 30% stenosis in the previously PTCAd site.  There    was distal 25% stenosis.  Left ventriculogram:  A left ventriculogram was obtained in the RAO projection with an EF of 60% and normal wall motion.  CONCLUSIONS:  Nonobstructive coronary artery disease.  PLAN:  The patient will continue to have aggressive secondary risk factor modification with medical management.  If his symptoms continue, a nonanginal etiology will be looked for. DD:  11/19/99 TD:  11/23/99 Job: [id number]

## 2010-11-05 NOTE — Cardiovascular Report (Signed)
[place]. [facility]  Patient:    [name], [name] Visit Number: [id number] MRN: [id number]          Service Type: MED Location: 2000 2011 01 Attending Physician:  Talitha Givens Dictated by:   Everardo Beals Juanda Chance, M.D. [facility] Proc. Date: 09/24/01 Admit Date:  09/23/2001   CC:         [facility]  Rollene Rotunda, M.D. [facility]  Cardiopulmonary Laboratory   Cardiac Catheterization  PROCEDURES PERFORMED: Cardiac catheterization.  CLINICAL HISTORY: The patient is 70 years old and had a PTCA of his right coronary artery performed in 2000. He recently was seen at [facility] and found to have an irregular pulse. He was referred to Dr. Antoine Poche, who saw him and noted a left bundle-branch block and arranged for him to be evaluated with an echocardiogram. Before he could have this done, he developed chest pain and was seen at [facility] then transferred to [place] for further evaluation. His ECG showed left bundle-branch block and his cardiac markers were negative.  DESCRIPTION OF PROCEDURE: The procedure was performed via the right femoral artery using an arterial sheath and 6 French preformed coronary catheters.  A front wall arterial puncture was performed and Omnipaque contrast was used. A distal aortogram was performed to rule out abdominal aortic aneurysm. The patient tolerated the procedure well and left the laboratory in satisfactory condition.  RESULTS: The left main coronary artery: The left main coronary artery was free of significant disease.  Left anterior descending: The left anterior descending artery gave rise to two large septal and two small septal perforators and three diagonal branches. These and the LAD proper were free of significant disease.  Circumflex artery: The circumflex artery gave rise to a large intermediate branch, a large marginal branch and a posterolateral branch. There was  30% narrowing in the proximal circumflex after the intermediate branch. There were no other significant obstructions.  Right coronary artery: The right coronary artery is a moderate sized vessel that gave rise to a conus branch, two right ventricular branches, a posterior descending branch and three posterolateral branches. There was 30% proximal, 30% mid and 30% distal stenosis in the right coronary artery.  LEFT VENTRICULOGRAPHY: The left ventriculogram performed in the RAO projection showed global hypokinesis with an estimated ejection fraction of 25%. There was minimal, if any, mitral regurgitation.  DISTAL AORTOGRAM: Distal aortogram was performed which showed patent renal arteries and no significant aortoiliac stenosis.  CONCLUSIONS: 1. Nonobstructive coronary artery disease. 2. Severe left ventricular dysfunction with ejection fraction of 25%.  RECOMMENDATIONS: The patients left ventricular dysfunction appears to be new. The etiology is not clear, but it does not appear to be ischemic. We will plan to begin medical therapy and keep the patient in the hospital for this. Dictated by:   Everardo Beals Juanda Chance, M.D. [facility] Attending Physician:  Talitha Givens DD:  09/24/01 TD:  09/25/01 Job: [id number]

## 2010-11-05 NOTE — Discharge Summary (Signed)
NAMEREYLI, SCHROTH              ACCOUNT NO.:  [id number]   MEDICAL RECORD NO.:  [id number]          PATIENT TYPE:  INP   LOCATION:  3704                         FACILITY:  [facility]   PHYSICIAN:  Vida Roller, M.D.   DATE OF BIRTH:  March 19, 1941   DATE OF ADMISSION:  02/28/2004  DATE OF DISCHARGE:  02/29/2004                           DISCHARGE SUMMARY - REFERRING   DISCHARGE DIAGNOSES:  1.  Syncope felt to be vagal versus orthostatic.  2.  Ischemic cardiomyopathy, alcoholic, ejection fraction 10% status post      ICD.  3.  Nonobstructive coronary artery disease.  4.  Polysubstance abuse.  5.  Hypertension.  6.  Hyperlipidemia, treated.  7.  Depression.  8.  Leukocytosis.  9.  Left bundle branch block.   Mr. Starace is a 70 year old male patient with known history of coronary  artery disease who has had a percutaneous intervention to his right coronary  artery in the past.  He does have a global nonischemic cardiomyopathy felt  secondary to alcoholism.  On the Thursday prior to admission he complained  of stomach rumbling and took Correctol.  This seemed to help.  He woke up  again the night before admission with hives, took some Benadryl, then began  experiencing nausea, vomiting, substernal chest pain, and diaphoresis.  He  began vomiting, stood up, and had a syncopal spell which lasted for just a  few minutes according to the patient.  He went back to the bed and slept.  He woke with nausea and vomiting and then presented to the emergency room  for evaluation.   During his hospital stay we did have the [employer] rep check his ICD and this  revealed normal device function.  In addition, laboratory work did reveal a  urinalysis positive for benzodiazepines and marijuana.  Cardiac isoenzymes  were negative.  BNP was 116.  He did have some abdominal discomfort and it  revealed mild diverticular disease, but no abscess.  A GI consult was  obtained and it was felt that the patient  should follow up with Dr. Leone Payor  or Oren Beckmann, P.A.-C. for the abdominal discomfort.  He was placed on  Cipro and Flagyl for 10 days for presumed diverticular infection with no  abscess.   The patient was then discharged to home after an overnight stay.  He was  discharged home in stable condition on the following medications.   DISCHARGE MEDICATIONS:  1.  Cipro 500 mg b.i.d. for 10 days.  2.  Flagyl 500 mg one tablet b.i.d. for 10 days.  3.  He is to otherwise resume all home medications.   ACTIVITY:  As tolerated.   DIET:  Remain on a low residue diet with no nuts, seeds, or lettuce.   FOLLOWUP:  Call with questions or concerns.  Follow up with Dr. Leone Payor or  Oren Beckmann in two weeks.      Larita Fife   LB/MEDQ  D:  04/12/2004  T:  04/12/2004  Job:  [id number]   cc:   Iva Boop, M.D. [facility], PA-C   Rollene Rotunda,  M.D. 

## 2010-11-05 NOTE — Assessment & Plan Note (Signed)
[facility]                            CARDIOLOGY OFFICE NOTE   NAME:[name], [name]                     MRN:          [id number]  DATE:08/17/2006                            DOB:          04-25-1941    REASON FOR PRESENTATION:  A patient with cardiomyopathy.   HISTORY OF PRESENT ILLNESS:  The patient returns for followup.  He has  not been back to see me since 2005, though he did see Dr. Ladona Ridgel in  2006.  He had a stress perfusion study in 2006 as well.  This  gentleman's ejection fraction had been approximately 20% in the past.  However, the perfusion study in 2006 demonstrated it to be 65%!  The  patient has been lost to followup since that time.  However, he felt  like he might have done some trauma to his defibrillator or that it  might have fired.  He noticed this after carrying some wood in.  He felt  it acting up.  It was not a jolt consistent with device firing.  He  did not have any presyncope or syncope.  He does not have any chest  discomfort, neck discomfort, arm discomfort, activity induced nausea and  vomiting, or excess diaphoresis.  He denies any shortness of breath.  He  has no PND or orthopnea.  He has been taking his medications which he  gets through the [place].  He unfortunately is still smoking cigarettes and  drinks alcohol.   PAST MEDICAL HISTORY:  Non-ischemic cardiomyopathy, coronary artery  disease, nonobstructive (last catheterization 2003 with 30% proximal  circumflex stenosis, 30% mid right coronary stenosis), status post  [employer] Contact Renewal Biventricular implantable defibrillator,  previous alcohol abuse, dyslipidemia, peptic ulcer disease.   ALLERGIES:  NONE.   CURRENT MEDICATIONS:  1. Clonazepam 0.5 mg b.i.d.  2. Metoprolol 200 mg daily.  3. Amlodipine 5 mg daily.  4. Furosemide 40 mg daily.  5. Lisinopril 40 mg daily.  6. Omeprazole 20 mg daily.  7. Simvastatin 40 mg daily.   REVIEW OF SYSTEMS:  As  stated in the HPI and otherwise negative for  other systems.   PHYSICAL EXAMINATION:  The patient is in no distress.  Blood pressure  138/86, heart rate 66 and regular, weight 144 pounds, BMI 22.  HEENT:  Eyes unremarkable.  Pupils equal, round and reactive to light,  fundi not visualized.  Oral mucosa unremarkable.  NECK:  No jugular venous distention at 45 degrees, carotid upstroke  brisk and symmetric, no bruits, no thyromegaly.  LYMPHATICS:  No cervical, axillary, inguinal adenopathy.  LUNGS:  Clear to auscultation bilaterally.  BACK:  No costovertebral angle tenderness.  CHEST:  Well-healed pacemaker pocket without bruising, erythema or  exudate.  HEART:  PMI nondisplaced or sustained, S1 and S2 within normal limits,  no S3, no S4, no clicks, rubs, murmurs.  ABDOMEN:  Flat, positive bowel sounds normal in frequency, no bruits,  rebound, guarding or midline pulsatile mass, no hepatomegaly,  splenomegaly.  SKIN:  No rashes or nodules.  EXTREMITIES:  2+ pulses throughout.  No cyanosis, clubbing or edema.  NEURO:  Oriented to person, place, time.  Cranial nerves II-XII grossly  intact.  Motor grossly intact.   EKG:  Sinus rhythm, ventricular pacing with 100% capture.   ASSESSMENT AND PLAN:  1. Cardiomyopathy.  The patient's ejection fraction may be improved      according to the last Cardiolite.  I am getting an echocardiogram      to further evaluate this.  He will continue on the medications as      listed.  2. Status post biventricular pacemaker defibrillator.  This was      interrogated today.  Atrial amplitude was 5.4, right ventricular      amplitude greater than 25, left greater than 19.3, impedence in the      atrium 693, right ventricle 697, left ventricle 722, threshold in      the right atrium 0.6 volts at 0.5 milliseconds, right ventricle 0.6      volts at 0.5 milliseconds and left ventricle 1.4 volts at 5      milliseconds.  He has had 3 nonsustained episodes of  ventricular      tachycardia not requiring intervention.  He is in a BTT mode.  He      is pacing in his ventricle 100% of the time.   He will continue with followup and is going to be set up with a home  monitor because he has not been compliant with followup.   1. Alcohol.  We talked about the need to stop drinking.  He is      currently drinking a six-pack a day.  2. Tobacco.  We talked about the need to stop smoking.  He is      currently smoking 1 and 1/2 packs per day.  A primary care      physician at the [facility] recently gave him Chantix and he      is going to start this.  3. Followup.  I would like to see him back in 4 months.  I am going to      get his echocardiogram and also check a BMET and BNP.     Rollene Rotunda, MD, [facility]  Electronically Signed    JH/MedQ  DD: 08/17/2006  DT: 08/17/2006  Job #: [id number]

## 2011-02-02 ENCOUNTER — Emergency Department (HOSPITAL_COMMUNITY)
Admission: EM | Admit: 2011-02-02 | Discharge: 2011-02-02 | Disposition: A | Discharge status: Other Acute Inpatient Hospital | Payer: Medicare Other | Source: Home / Self Care | Attending: Emergency Medicine | Admitting: Emergency Medicine

## 2011-02-02 ENCOUNTER — Inpatient Hospital Stay (HOSPITAL_COMMUNITY)
Admission: AD | Admit: 2011-02-02 | Discharge: 2011-02-05 | DRG: 897 | Disposition: A | Discharge status: Home / Self Care | Payer: Medicare Other | Source: Ambulatory Visit | Attending: Psychiatry | Admitting: Psychiatry

## 2011-02-02 DIAGNOSIS — E785 Hyperlipidemia, unspecified: Secondary | ICD-10-CM | POA: Insufficient documentation

## 2011-02-02 DIAGNOSIS — E871 Hypo-osmolality and hyponatremia: Secondary | ICD-10-CM | POA: Insufficient documentation

## 2011-02-02 DIAGNOSIS — F1020 Alcohol dependence, uncomplicated: Principal | ICD-10-CM

## 2011-02-02 DIAGNOSIS — F1320 Sedative, hypnotic or anxiolytic dependence, uncomplicated: Secondary | ICD-10-CM

## 2011-02-02 DIAGNOSIS — Z6379 Other stressful life events affecting family and household: Secondary | ICD-10-CM

## 2011-02-02 DIAGNOSIS — Z79899 Other long term (current) drug therapy: Secondary | ICD-10-CM

## 2011-02-02 DIAGNOSIS — I509 Heart failure, unspecified: Secondary | ICD-10-CM | POA: Insufficient documentation

## 2011-02-02 DIAGNOSIS — F17200 Nicotine dependence, unspecified, uncomplicated: Secondary | ICD-10-CM

## 2011-02-02 DIAGNOSIS — F1994 Other psychoactive substance use, unspecified with psychoactive substance-induced mood disorder: Secondary | ICD-10-CM

## 2011-02-02 DIAGNOSIS — I10 Essential (primary) hypertension: Secondary | ICD-10-CM

## 2011-02-02 DIAGNOSIS — E7800 Pure hypercholesterolemia, unspecified: Secondary | ICD-10-CM

## 2011-02-02 DIAGNOSIS — Z950 Presence of cardiac pacemaker: Secondary | ICD-10-CM

## 2011-02-02 DIAGNOSIS — F1010 Alcohol abuse, uncomplicated: Secondary | ICD-10-CM | POA: Insufficient documentation

## 2011-02-02 LAB — DIFFERENTIAL
Basophils Absolute: 0.1 10*3/uL (ref 0.0–0.1)
Monocytes Absolute: 1 10*3/uL (ref 0.1–1.0)
Monocytes Relative: 11 % (ref 3–12)
Neutro Abs: 5.9 10*3/uL (ref 1.7–7.7)
Neutrophils Relative %: 63 % (ref 43–77)

## 2011-02-02 LAB — RAPID URINE DRUG SCREEN, HOSP PERFORMED
Amphetamines: NOT DETECTED
Barbiturates: NOT DETECTED
Benzodiazepines: POSITIVE — AB
Tetrahydrocannabinol: POSITIVE — AB

## 2011-02-02 LAB — COMPREHENSIVE METABOLIC PANEL
ALT: 34 U/L (ref 0–53)
AST: 40 U/L — ABNORMAL HIGH (ref 0–37)
Alkaline Phosphatase: 85 U/L (ref 39–117)
BUN: 10 mg/dL (ref 6–23)
CO2: 27 mEq/L (ref 19–32)
CO2: 25 mEq/L (ref 19–32)
Calcium: 9.6 mg/dL (ref 8.4–10.5)
Chloride: 90 mEq/L — ABNORMAL LOW (ref 96–112)
Chloride: 94 mEq/L — ABNORMAL LOW (ref 96–112)
Creatinine, Ser: 1.13 mg/dL (ref 0.50–1.35)
GFR calc non Af Amer: >60 mL/min (ref >60)
GFR calc non Af Amer: >60 mL/min (ref >60)
Glucose, Bld: 104 mg/dL — ABNORMAL HIGH (ref 70–99)
Glucose, Bld: 113 mg/dL — ABNORMAL HIGH (ref 70–99)
Potassium: 4.8 mEq/L (ref 3.5–5.1)
Total Bilirubin: 0.4 mg/dL (ref 0.3–1.2)
Total Protein: 7.9 g/dL (ref 6.0–8.3)

## 2011-02-02 LAB — ETHANOL: Alcohol, Ethyl (B): <11 mg/dL (ref 0–11)

## 2011-02-02 LAB — CBC
HCT: 34.5 % — ABNORMAL LOW (ref 39.0–52.0)
Hemoglobin: 12.1 g/dL — ABNORMAL LOW (ref 13.0–17.0)
MCH: 32.8 pg (ref 26.0–34.0)
MCV: 93.5 fL (ref 78.0–100.0)
RBC: 3.69 MIL/uL — ABNORMAL LOW (ref 4.22–5.81)
RDW: 12.4 % (ref 11.5–15.5)

## 2011-02-02 LAB — POCT I-STAT, CHEM 8: HCT: 39 % (ref 39.0–52.0)

## 2011-02-03 DIAGNOSIS — F1994 Other psychoactive substance use, unspecified with psychoactive substance-induced mood disorder: Secondary | ICD-10-CM

## 2011-02-03 DIAGNOSIS — F1310 Sedative, hypnotic or anxiolytic abuse, uncomplicated: Secondary | ICD-10-CM

## 2011-02-03 DIAGNOSIS — F17200 Nicotine dependence, unspecified, uncomplicated: Secondary | ICD-10-CM

## 2011-02-03 DIAGNOSIS — F1010 Alcohol abuse, uncomplicated: Secondary | ICD-10-CM

## 2011-02-08 NOTE — Assessment & Plan Note (Signed)
NAMEMarland Koch  EMAURI, KRYGIER NO.:  [id number]  MEDICAL RECORD NO.:  [id number]  LOCATION:  0305                          FACILITY:  [facility]  PHYSICIAN:  Orson Aloe, MD       DATE OF BIRTH:  April 18, 1941  DATE OF ADMISSION:  02/02/2011 DATE OF DISCHARGE:                      PSYCHIATRIC ADMISSION ASSESSMENT   IDENTIFYING INFORMATION:  This is a 70 year old Caucasian male from [place], [place].  REASON FOR ADMISSION:  The patient came to [facility] emergency department with complaints of alcohol abuse.  He had consumed 3-4 beers a day for many, many years.  He had stopped drinking several times, up to 3 years at a time.  He also quit smoking about the same 3 years, so went to chewing tobacco.  He stopped using nicotine altogether for 3 years at another time.  He started back drinking and smoking about a decade ago.  He consumed 3-4 beers a day and 4-5 cigarettes a day until he started some sangria wine and noticed he started drinking a lot and drinking earlier in the evening and more total use.  He got depressed when he was forced to retire about 8 years ago and subsequently noted an increase in his alcohol use, ultimately concluded that his life was a "damn mess."  He did not want to use any longer.  PAST PSYCHIATRIC HISTORY:  He had seen a counselor before, but he is not sure exactly why, perhaps for his substance use problem.  SOCIAL HISTORY:  He attended ninth grade.  He actually through the [employer] was able to attend 2 years of college without getting a GED.  He has been married once and divorced once.  She has 4 children, sons ages 55 and 59 and daughters ages 39 and 74.  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  FAMILY HISTORY:  He has 2 older brother who were alcoholics.  ALCOHOL HISTORY:  He began using caffeine in his childhood years, nicotine at age 59, alcohol at age 61,  marijuana use in his 40s and in his 11s.  Never used any IV drugs.  MEDICAL HISTORY:  His primary care provider is [facility] in [place].  His medical problems include hypertension.  He has a pacemaker with a defibrillator in place for a reason he is not sure, probably for atrial fib.  Increased cholesterol.  He is on aspirin for stroke prevention and myocardial infarction prevention.  He has seasonal allergies as well.  MEDICATIONS: 1. Pravastatin 40 mg a day. 2. Metoprolol 100 mg twice a day. 3. Omeprazole 20 mg a day. 4. Amlodipine 5 mg in the morning. 5. Lisinopril 40 mg in the morning. 6. Zyrtec 10 mg in the morning. 7. Aspirin 81 mg in the morning. 8. Vitamin D3 1000 international units in the morning. 9. Multivitamin.  ALLERGIES:  He has a possible reaction to Sudafed.  He never takes it, so he does not know if he has a reaction to it or not.  Positive physical findings in the emergency department, he was noted to have a CBC that showed low RBCs at 3.69, hemoglobin at  12.1 and hematocrit at 34.5 and his urine drug screen was positive for benzos and THC.  AST elevated at 44.  Alcohol level is less than 11.  Comprehensive metabolic panel showed low sodium at 125 which improved to 127 and eventually 129.  Glucoses are high at 104, 139 and then 113.  Chloride which low at 90, 95 and then 94.  MENTAL STATUS EXAM:  He denied any suicidal or homicidal ideation, hallucinations, delusions.  He had good eye contact, clear, goal- directed thoughts.  He was oriented x4.  His recent and remote memory are intact but some name recall is slow.  He has noted little change in this over his lifetime.  He has natural conversational of speech, rate, volume and tone.  His judgment is intact, seeing the need for treatment and seeking such treatment and insight is fairly accurate regarding his substance abuse issues and his mood.  DIAGNOSES:  AXIS I:  Alcohol dependence,  benzodiazepine dependence, nicotine dependence and substance-induced mood disorder. AXIS II:  Deferred. AXIS III:  Hypertension, hypercholesterolemia, seasonal allergies, pacemaker defibrillator in place as well as aspirin for stroke and MI prevention and has some need for vitamin D according to his PA. AXIS IV:  Moderate with psychosocial problems related to substance use issues. AXIS IV:  40.  PLAN:  Detox from nicotine, alcohol and benzos and [place] and fishing.          ______________________________ Orson Aloe, MD     EW/MEDQ  D:  02/03/2011  T:  02/03/2011  Job:  [id number]  Electronically Signed by Orson Aloe  on 02/08/2011 09:46:24 AM

## 2011-02-15 NOTE — Discharge Summary (Signed)
[name], [name]              ACCOUNT NO.:  [id number]  MEDICAL RECORD NO.:  [id number]  LOCATION:  0305                          FACILITY:  [facility]  PHYSICIAN:  Orson Aloe, MD       DATE OF BIRTH:  1940-09-14  DATE OF ADMISSION:  02/02/2011 DATE OF DISCHARGE:  02/05/2011                              DISCHARGE SUMMARY   IDENTIFYING INFORMATION:  This is a 70 year old married Caucasian male. This was a voluntary admission.  HISTORY OF PRESENT ILLNESS:  [name] presented requesting detox from alcohol.  [name] he was tired of drinking, had been drinking about 2 quarts of wine daily and smoking about 1 joint of marijuana every 3-4 days.  He had had a history of a previous detox from alcohol about 10 years previously and was sober about 6 months.  His primary care practitioner prescribes clonazepam for anxiety and he was taking it regularly.  MEDICAL EVALUATION:  He was medically evaluated in our emergency room. This is a normally-developed Caucasian male with no abnormal movements. Previously worked full time and reported a history of some depressed mood in the past when he retired from full-time work.  Now very active with gardening.  Chronic medical problems include hypertension, hyperlipidemia, and cardiac disease.  He has a pacemaker.  Primary care practitioner is Dr. Antoine Poche. ADMITTING VITAL SIGNS:  Temperature 97.6, pulse 76, respirations 20, blood pressure 137/90.  Chemistry was initially remarkable for sodium of 127, potassium 4.3, chloride 95, carbon dioxide 23, BUN 9, creatinine 1.30.  Alcohol screen negative.  Urine drug screen was positive for cannabis and benzodiazepines.  Hemoglobin slightly decreased at 12.1, hematocrit 34.5, platelets normal at 292,000.  COURSE OF HOSPITALIZATION:  [name] was admitted to our detox program and started on Librium detox protocol with a goal of safe detox in 4 days. He tolerated the protocol well with no problems and did not  display acute withdrawal symptoms.  Liver enzymes were found to be mildly elevated with an SGOT of 44, SGPT 34, alkaline phosphatase 85, total bilirubin 0.4.  Group participation was satisfactory and he voiced his intention to follow up by attending AA meetings in his local community. He was doing well with the withdrawal protocol and requested to be discharged by the 18th.  And he had no suicidal or dangerous thoughts so we elected to send him home with the rest of his detox protocol. Meanwhile, we continued his routine medications and he had no adverse physical symptoms while on our unit.  DISCHARGE/PLAN:  Follow up with [facility] on August 29 at 9 a.m.  DISCHARGE DIAGNOSIS:  Axis I:  Alcohol dependence, benzodiazepine dependence, nicotine dependence. Axis II:  Deferred. Axis III:  Hypertension, dyslipidemia, has a history of pacemaker insertion. Axis IV:  Deferred. Axis V:  Current 58, past year not known.  DISCHARGE CONDITION:  Stable and improved.  DISCHARGE MEDICATIONS:  He is instructed to discontinue the Klonopin and to continue Librium 25 mg at h.s. n August 18 and 25 mg at h.s. on August 19 and he is finished with his protocol.  He is to continue his other routine medications as prescribed by his primary physician and cardiologist.  [name], N.P.   ______________________________ Orson Aloe, MD    MAS/MEDQ  D:  02/08/2011  T:  02/08/2011  Job:  [id number]  Electronically Signed by Kari Baars N.P. on 02/10/2011 08:12:42 AM Electronically Signed by Orson Aloe  on 02/15/2011 11:25:56 AM

## 2011-09-14 ENCOUNTER — Ambulatory Visit: Payer: Medicare Other | Admitting: Cardiology

## 2011-09-28 ENCOUNTER — Ambulatory Visit (INDEPENDENT_AMBULATORY_CARE_PROVIDER_SITE_OTHER): Payer: Medicare Other | Admitting: Cardiology

## 2011-09-28 ENCOUNTER — Encounter: Payer: Self-pay | Admitting: Cardiology

## 2011-09-28 ENCOUNTER — Ambulatory Visit: Payer: Medicare Other | Admitting: Cardiology

## 2011-09-28 VITALS — BP 140/80 | HR 69 | Ht 68.0 in | Wt 139.0 lb

## 2011-09-28 DIAGNOSIS — I472 Ventricular tachycardia, unspecified: Secondary | ICD-10-CM

## 2011-09-28 DIAGNOSIS — I6529 Occlusion and stenosis of unspecified carotid artery: Secondary | ICD-10-CM

## 2011-09-28 DIAGNOSIS — F17200 Nicotine dependence, unspecified, uncomplicated: Secondary | ICD-10-CM

## 2011-09-28 DIAGNOSIS — F1010 Alcohol abuse, uncomplicated: Secondary | ICD-10-CM

## 2011-09-28 DIAGNOSIS — I2510 Atherosclerotic heart disease of native coronary artery without angina pectoris: Secondary | ICD-10-CM

## 2011-09-28 DIAGNOSIS — I428 Other cardiomyopathies: Secondary | ICD-10-CM

## 2011-09-28 NOTE — Patient Instructions (Signed)
Continue current medications as listed.  Follow up in 1 year with Dr Hochrein.  You will receive a letter in the mail 2 months before you are due.  Please call us when you receive this letter to schedule your follow up appointment.  

## 2011-09-28 NOTE — Assessment & Plan Note (Signed)
This is mild and be followed up in 2014.

## 2011-09-28 NOTE — Assessment & Plan Note (Signed)
He had an echo with a well preserved ejection fraction last year. I would not suspect this to be reduced. He seems to be euvolemic. He will continue the meds as listed.

## 2011-09-28 NOTE — Assessment & Plan Note (Signed)
He understands that this is likely the etiology for his cardiomyopathy. He understands the alcohol.

## 2011-09-28 NOTE — Assessment & Plan Note (Signed)
I will check to make sure he is up-to-date with his device followup. I suspect is not.

## 2011-09-28 NOTE — Assessment & Plan Note (Signed)
He is unable to quit smoking

## 2011-09-28 NOTE — Progress Notes (Signed)
   HPI The patient presented for one year followup. Since I last saw him he has had no cardiovascular complaints.  The patient denies any new symptoms such as chest discomfort, neck or arm discomfort. There has been no new shortness of breath, PND or orthopnea. There have been no reported palpitations, presyncope or syncope.  He has not been very active this winter.  However, he says he's going to start doing more walking. I do see he was hospitalized for alcohol dependence. He unfortunately continues to drink and smoke cigarettes.  Allergies  Allergen Reactions  . Sudafed (Pseudoephedrine Hcl)   . Avelox (Moxifloxacin Hcl In Nacl)     Current Outpatient Prescriptions  Medication Sig Dispense Refill  . amLODipine (NORVASC) 5 MG tablet Take 5 mg by mouth daily.        Marland Kitchen atorvastatin (LIPITOR) 20 MG tablet Take 1 tablet (20 mg total) by mouth daily.  30 tablet  11  . cetirizine (ZYRTEC) 10 MG tablet Take 10 mg by mouth daily.        . clonazePAM (KLONOPIN) 1 MG tablet Take 1 mg by mouth 2 (two) times daily as needed.        Marland Kitchen lisinopril (PRINIVIL,ZESTRIL) 40 MG tablet Take 40 mg by mouth daily.        . metoprolol (LOPRESSOR) 100 MG tablet Take 100 mg by mouth 2 (two) times daily.        Marland Kitchen omeprazole (PRILOSEC) 20 MG capsule Take 20 mg by mouth 2 (two) times daily.        Marland Kitchen PROAIR HFA 108 (90 BASE) MCG/ACT inhaler       . simvastatin (ZOCOR) 40 MG tablet Take 40 mg by mouth at bedtime.          Past Medical History  Diagnosis Date  . Cardiomyopathy     EF was 20% (60% last echo 2008)  . Tobacco abuse   . Carotid artery disease   . EtOH dependence   . Peptic ulcer disease   . Dyslipidemia     Past Surgical History  Procedure Date  . Ulcer surgery   . Icd     ROS: As stated in the HPI and negative for all other systems.  PHYSICAL EXAM BP 140/80  Pulse 69  Ht 5\' 8"  (1.727 m)  Wt 139 lb (63.05 kg)  BMI 21.13 kg/m2 GENERAL:  Well appearing HEENT:  Pupils equal round and  reactive, fundi not visualized, oral mucosa unremarkable NECK:  No jugular venous distention, waveform within normal limits, carotid upstroke brisk and symmetric, no bruits, no thyromegaly LYMPHATICS:  No cervical, inguinal adenopathy LUNGS:  Clear to auscultation bilaterally BACK:  No CVA tenderness, ICD pocket CHEST:  Unremarkable HEART:  PMI not displaced or sustained,S1 and S2 within normal limits, no S3, no S4, no clicks, no rubs, no murmurs ABD:  Flat, positive bowel sounds normal in frequency in pitch, no bruits, no rebound, no guarding, no midline pulsatile mass, no hepatomegaly, no splenomegaly EXT:  2 plus pulses throughout, no edema, no cyanosis no clubbing SKIN:  No rashes no nodules NEURO:  Cranial nerves II through XII grossly intact, motor grossly intact throughout PSYCH:  Cognitively intact, oriented to person place and time   EKG:  Sinus rhythm with ventricular pacing, rate 69. 09/28/2011   ASSESSMENT AND PLAN

## 2011-10-03 ENCOUNTER — Telehealth: Payer: Self-pay | Admitting: Cardiology

## 2011-10-03 NOTE — Telephone Encounter (Signed)
Spoke with pt, he thinks it was there device nurses that called him. Will forward for their review.

## 2011-10-03 NOTE — Telephone Encounter (Signed)
Fu call °Patient returning your call °

## 2011-10-13 ENCOUNTER — Encounter: Payer: Self-pay | Admitting: Internal Medicine

## 2011-10-13 ENCOUNTER — Ambulatory Visit (INDEPENDENT_AMBULATORY_CARE_PROVIDER_SITE_OTHER): Payer: Medicare Other | Admitting: Internal Medicine

## 2011-10-13 VITALS — BP 145/84 | HR 63 | Ht 66.0 in | Wt 139.0 lb

## 2011-10-13 DIAGNOSIS — F17200 Nicotine dependence, unspecified, uncomplicated: Secondary | ICD-10-CM

## 2011-10-13 DIAGNOSIS — I428 Other cardiomyopathies: Secondary | ICD-10-CM

## 2011-10-13 DIAGNOSIS — I472 Ventricular tachycardia: Secondary | ICD-10-CM

## 2011-10-13 DIAGNOSIS — F1010 Alcohol abuse, uncomplicated: Secondary | ICD-10-CM

## 2011-10-13 LAB — ICD DEVICE OBSERVATION
AL AMPLITUDE: 6.1 mv
AL THRESHOLD: 0.8 V
ATRIAL PACING ICD: 0 pct
BAMS-0003: 70 {beats}/min
CHARGE TIME: 7.2 s
RV LEAD THRESHOLD: 0.4 V
TZAT-0001FASTVT: 1
TZAT-0005FASTVT: 81 pct
TZAT-0012FASTVT: 200 ms
TZAT-0013FASTVT: 2
TZAT-0018FASTVT: NEGATIVE
TZAT-0018FASTVT: NEGATIVE
TZAT-0019FASTVT: 7.5 V
TZAT-0019FASTVT: 7.5 V
TZAT-0020FASTVT: 1 ms
TZON-0003FASTVT: 363 ms
TZON-0004FASTVT: 2.5
TZON-0005FASTVT: 1
TZON-0010FASTVT: 10 ms
TZST-0001FASTVT: 4
TZST-0001FASTVT: 5
TZST-0001FASTVT: 7
TZST-0003FASTVT: 21 J
TZST-0003FASTVT: 31 J
TZST-0003FASTVT: 31 J

## 2011-10-13 NOTE — Assessment & Plan Note (Signed)
Previously treated with ATP therapy None since last interrogation  Normal BiV ICD function See Pace Art report No changes today

## 2011-10-13 NOTE — Patient Instructions (Signed)
Your physician you to follow up in 1 year with Dr. Allred. You will receive a reminder letter in the mail one-two months in advance. If you don't receive a letter, please call our office to schedule the follow-up appointment. Your physician recommends that you continue on your current medications as directed. Please refer to the Current Medication list given to you today. 

## 2011-10-13 NOTE — Assessment & Plan Note (Signed)
Drinks 4-6 beers per day Not ready to quit

## 2011-10-13 NOTE — Assessment & Plan Note (Signed)
Good response to CRT 100% biv paced No changes

## 2011-10-13 NOTE — Assessment & Plan Note (Signed)
Cessation advised He is not willing to quit

## 2011-10-13 NOTE — Progress Notes (Signed)
PCP:  Horald Pollen., PA, PA Primary Cardiologist:  Dr Antoine Poche  The patient presents today for routine electrophysiology followup.  Since last being seen in our clinic, the patient reports doing very well.  He remains active.  Today, he denies symptoms of palpitations, chest pain, shortness of breath, orthopnea, PND, lower extremity edema, dizziness, presyncope, syncope, or neurologic sequela.  Unfortunately, he continues to smoke and drink heavy ETOH.  He is not ready to quit.  The patient feels that he is tolerating medications without difficulties and is otherwise without complaint today.   Past Medical History  Diagnosis Date  . Nonischemic cardiomyopathy     EF was 20% (60% last echo 2008)  . Tobacco abuse   . Carotid artery disease   . EtOH dependence   . Peptic ulcer disease   . Dyslipidemia    Past Surgical History  Procedure Date  . Ulcer surgery   . Biventricular defibrillator implantation     [employer]    Current Outpatient Prescriptions  Medication Sig Dispense Refill  . amLODipine (NORVASC) 5 MG tablet Take 5 mg by mouth daily.        . cetirizine (ZYRTEC) 10 MG tablet Take 10 mg by mouth daily.        . clonazePAM (KLONOPIN) 1 MG tablet Take 1 mg by mouth 2 (two) times daily as needed.        Marland Kitchen lisinopril (PRINIVIL,ZESTRIL) 40 MG tablet Take 40 mg by mouth daily.        . metoprolol (LOPRESSOR) 100 MG tablet Take 100 mg by mouth 2 (two) times daily.        Marland Kitchen omeprazole (PRILOSEC) 20 MG capsule Take 20 mg by mouth 2 (two) times daily.        . pravastatin (PRAVACHOL) 40 MG tablet Take 1 tablet by mouth Daily.      Marland Kitchen PROAIR HFA 108 (90 BASE) MCG/ACT inhaler Inhale 2 puffs into the lungs every 4 (four) hours as needed.         Allergies  Allergen Reactions  . Sudafed (Pseudoephedrine Hcl)   . Avelox (Moxifloxacin Hcl In Nacl)     History   Social History  . Marital Status: Divorced    Spouse Name: N/A    Number of Children: N/A  . Years of Education:  N/A   Occupational History  . Not on file.   Social History Main Topics  . Smoking status: Current Everyday Smoker -- 0.5 packs/day for 45 years    Types: Cigarettes  . Smokeless tobacco: Former Neurosurgeon    Types: Chew    Quit date: 06/20/1989   Comment: he is not interested in cessation  . Alcohol Use: 1.8 oz/week    3 Cans of beer per week     drings 4-6 beers per day  . Drug Use: No  . Sexually Active: Not on file   Other Topics Concern  . Not on file   Social History Narrative  . No narrative on file    Physical Exam: Filed Vitals:   10/13/11 0849  BP: 145/84  Pulse: 63  Height: 5\' 6"  (1.676 m)  Weight: 139 lb (63.05 kg)    GEN- The patient is well appearing, alert and oriented x 3 today.   Head- normocephalic, atraumatic Eyes-  Sclera clear, conjunctiva pink Ears- hearing intact Oropharynx- clear Neck- supple, no JVP Lymph- no cervical lymphadenopathy Lungs- Clear to ausculation bilaterally, normal work of breathing Chest- ICD pocket is well healed  Heart- Regular rate and rhythm, no murmurs, rubs or gallops, PMI not laterally displaced GI- soft, NT, ND, + BS Extremities- no clubbing, cyanosis, or edema   ICD interrogation- reviewed in detail today,  See PACEART report  Assessment and Plan:

## 2011-12-19 ENCOUNTER — Encounter (HOSPITAL_COMMUNITY): Payer: Self-pay | Admitting: Emergency Medicine

## 2011-12-19 ENCOUNTER — Emergency Department (HOSPITAL_COMMUNITY)
Admission: EM | Admit: 2011-12-19 | Discharge: 2011-12-20 | Disposition: A | Discharge status: Other Acute Inpatient Hospital | Payer: 59 | Source: Home / Self Care | Attending: Emergency Medicine | Admitting: Emergency Medicine

## 2011-12-19 DIAGNOSIS — F1010 Alcohol abuse, uncomplicated: Secondary | ICD-10-CM

## 2011-12-19 DIAGNOSIS — I428 Other cardiomyopathies: Secondary | ICD-10-CM | POA: Insufficient documentation

## 2011-12-19 DIAGNOSIS — F329 Major depressive disorder, single episode, unspecified: Secondary | ICD-10-CM

## 2011-12-19 DIAGNOSIS — E785 Hyperlipidemia, unspecified: Secondary | ICD-10-CM | POA: Insufficient documentation

## 2011-12-19 DIAGNOSIS — I2510 Atherosclerotic heart disease of native coronary artery without angina pectoris: Secondary | ICD-10-CM | POA: Insufficient documentation

## 2011-12-19 DIAGNOSIS — F17200 Nicotine dependence, unspecified, uncomplicated: Secondary | ICD-10-CM | POA: Insufficient documentation

## 2011-12-19 DIAGNOSIS — F3289 Other specified depressive episodes: Secondary | ICD-10-CM | POA: Insufficient documentation

## 2011-12-19 DIAGNOSIS — Z79899 Other long term (current) drug therapy: Secondary | ICD-10-CM | POA: Insufficient documentation

## 2011-12-19 LAB — RAPID URINE DRUG SCREEN, HOSP PERFORMED
Amphetamines: NOT DETECTED
Benzodiazepines: NOT DETECTED
Opiates: NOT DETECTED

## 2011-12-19 LAB — COMPREHENSIVE METABOLIC PANEL
Albumin: 4.3 g/dL (ref 3.5–5.2)
Alkaline Phosphatase: 69 U/L (ref 39–117)
BUN: 10 mg/dL (ref 6–23)
Creatinine, Ser: 1.08 mg/dL (ref 0.50–1.35)
GFR calc Af Amer: 79 mL/min — ABNORMAL LOW (ref >90)
Glucose, Bld: 125 mg/dL — ABNORMAL HIGH (ref 70–99)
Potassium: 5.1 mEq/L (ref 3.5–5.1)
Total Bilirubin: 0.7 mg/dL (ref 0.3–1.2)
Total Protein: 8 g/dL (ref 6.0–8.3)

## 2011-12-19 LAB — CBC
HCT: 37.7 % — ABNORMAL LOW (ref 39.0–52.0)
Hemoglobin: 13.1 g/dL (ref 13.0–17.0)
MCH: 32 pg (ref 26.0–34.0)
MCHC: 34.7 g/dL (ref 30.0–36.0)
MCV: 92 fL (ref 78.0–100.0)
RDW: 12.6 % (ref 11.5–15.5)

## 2011-12-19 LAB — ETHANOL: Alcohol, Ethyl (B): <11 mg/dL (ref 0–11)

## 2011-12-19 MED ORDER — NICOTINE 21 MG/24HR TD PT24
21.0000 mg | MEDICATED_PATCH | Freq: Once | TRANSDERMAL | Status: AC
  Administered 2011-12-19: 21 mg via TRANSDERMAL
  Filled 2011-12-19 (×2): qty 1

## 2011-12-19 MED ORDER — ZOLPIDEM TARTRATE 5 MG PO TABS
5.0000 mg | ORAL_TABLET | Freq: Every evening | ORAL | Status: DC | PRN
  Administered 2011-12-20: 5 mg via ORAL
  Filled 2011-12-19: qty 1

## 2011-12-19 MED ORDER — IBUPROFEN 200 MG PO TABS: 600.0000 mg | ORAL_TABLET | Freq: Three times a day (TID) | ORAL | Status: DC | PRN

## 2011-12-19 MED ORDER — ADULT MULTIVITAMIN W/MINERALS CH
1.0000 | ORAL_TABLET | Freq: Every day | ORAL | Status: DC
  Administered 2011-12-19 – 2011-12-20 (×2): 1 via ORAL
  Filled 2011-12-19 (×2): qty 1

## 2011-12-19 MED ORDER — THIAMINE HCL 100 MG/ML IJ SOLN: 100.0000 mg | Freq: Every day | INTRAMUSCULAR | Status: DC

## 2011-12-19 MED ORDER — LORAZEPAM 1 MG PO TABS
1.0000 mg | ORAL_TABLET | Freq: Four times a day (QID) | ORAL | Status: DC | PRN
  Administered 2011-12-19 – 2011-12-20 (×3): 1 mg via ORAL
  Filled 2011-12-19 (×4): qty 1

## 2011-12-19 MED ORDER — VITAMIN B-1 100 MG PO TABS
100.0000 mg | ORAL_TABLET | Freq: Every day | ORAL | Status: DC
  Administered 2011-12-19 – 2011-12-20 (×2): 100 mg via ORAL
  Filled 2011-12-19 (×2): qty 1

## 2011-12-19 MED ORDER — THIAMINE HCL 100 MG/ML IJ SOLN
Freq: Once | INTRAVENOUS | Status: AC
  Administered 2011-12-19: 14:00:00 via INTRAVENOUS
  Filled 2011-12-19: qty 1000

## 2011-12-19 MED ORDER — LORAZEPAM 2 MG/ML IJ SOLN: 1.0000 mg | Freq: Four times a day (QID) | INTRAMUSCULAR | Status: DC | PRN

## 2011-12-19 MED ORDER — LORAZEPAM 2 MG/ML IJ SOLN: 1.0000 mg | Freq: Once | INTRAMUSCULAR | Status: DC

## 2011-12-19 MED ORDER — LORAZEPAM 1 MG PO TABS
1.0000 mg | ORAL_TABLET | Freq: Once | ORAL | Status: AC
  Administered 2011-12-19: 1 mg via ORAL

## 2011-12-19 MED ORDER — ONDANSETRON HCL 8 MG PO TABS: 4.0000 mg | ORAL_TABLET | Freq: Three times a day (TID) | ORAL | Status: DC | PRN

## 2011-12-19 MED ORDER — FOLIC ACID 1 MG PO TABS
1.0000 mg | ORAL_TABLET | Freq: Every day | ORAL | Status: DC
  Administered 2011-12-19 – 2011-12-20 (×2): 1 mg via ORAL
  Filled 2011-12-19 (×2): qty 1

## 2011-12-19 MED ORDER — ACETAMINOPHEN 325 MG PO TABS
650.0000 mg | ORAL_TABLET | ORAL | Status: DC | PRN
Start: 2011-12-19 — End: 2011-12-20

## 2011-12-19 NOTE — ED Notes (Signed)
Pt here requesting detox from ETOH; pt sts drinks approx 9-10 beers a day; pt sts last drink was yesterday; pt denies SI/HI

## 2011-12-19 NOTE — ED Provider Notes (Signed)
Patient given nicotine patch and ativan ordered.  Accepted to behavioral health but awaiting bed.  Plan hold orders.   Hilario Quarry, MD 12/19/11 [phone]

## 2011-12-19 NOTE — ED Provider Notes (Signed)
History     CSN: [id number]  Arrival date & time 12/19/11  1047   First MD Initiated Contact with Patient 12/19/11 1224      Chief Complaint  Patient presents with  . Medical Clearance  . Alcohol Problem    (Consider location/radiation/quality/duration/timing/severity/associated sxs/prior treatment) HPI  Patient presents to emergency department with chief complaint of desiring help with his alcohol addiction. Patient states that he's had problems with alcohol for 30 years stating that he will go 2-3 years having little to no alcohol intake and then will start backhe again for a few years of heavy alcohol intake. Patient states he lives at home alone though his son-in-law and grandchildren live next door he feels very depressed. He denies suicidal or homicidal ideation. Patient states that he will drink approximately 9, 12 ounce beers a day. Patient states he realizes he has a problem because by 11:00 he will have one to three drinks and then he is "stuck at home" for the day because patient states "I refuse to drink and drive." Patient states that he believes his alcohol is very isolating. Patient states he drinks alone. Patient has not had anything to drink since yesterday evening. Patient states he took some Klonopin earlier today prior to arrival to the ER for feeling "shaky inside." He denies history of DTs or alcohol withdrawal seizures. He denies any other type of alcohol use. He notes that he smokes tobacco but "a pack will last me 3-4 days." He states he will ocassionally smoke marijuana but denies any other illicit drug use. Patient states he last was hospitalized in a psychiatric inpatient facility for alcohol detox approximately a year ago. He states he's never sought intensive outpatient therapy for alcohol abuse however he has attended [facility] [facility] many times. Patient states his primary care physician is in [place]  Past Medical History  Diagnosis Date  . Nonischemic  cardiomyopathy     EF was 20% (60% last echo 2008)  . Tobacco abuse   . Carotid artery disease   . EtOH dependence   . Peptic ulcer disease   . Dyslipidemia     Past Surgical History  Procedure Date  . Ulcer surgery   . Biventricular defibrillator implantation     [employer]    History reviewed. No pertinent family history.  History  Substance Use Topics  . Smoking status: Current Everyday Smoker -- 0.5 packs/day for 45 years    Types: Cigarettes  . Smokeless tobacco: Former Neurosurgeon    Types: Chew    Quit date: 06/20/1989   Comment: he is not interested in cessation  . Alcohol Use: 1.8 oz/week    3 Cans of beer per week     drings 4-6 beers per day      Review of Systems  All other systems reviewed and are negative.    Allergies  Sudafed and Avelox  Home Medications   Current Outpatient Rx  Name Route Sig Dispense Refill  . ALBUTEROL SULFATE HFA 108 (90 BASE) MCG/ACT IN AERS Inhalation Inhale 2 puffs into the lungs every 6 (six) hours as needed. For shortness of breath.    . AMLODIPINE BESYLATE 5 MG PO TABS Oral Take 5 mg by mouth daily.      . ATORVASTATIN CALCIUM 20 MG PO TABS Oral Take 20 mg by mouth daily.    Marland Kitchen CETIRIZINE HCL 10 MG PO TABS Oral Take 10 mg by mouth daily.      Marland Kitchen CLONAZEPAM 1 MG  PO TABS Oral Take 1 mg by mouth 2 (two) times daily as needed. For anxiety.    Marland Kitchen LISINOPRIL 40 MG PO TABS Oral Take 40 mg by mouth daily.      Marland Kitchen METOPROLOL TARTRATE 100 MG PO TABS Oral Take 100 mg by mouth 2 (two) times daily.      Marland Kitchen OMEPRAZOLE 20 MG PO CPDR Oral Take 20 mg by mouth 2 (two) times daily.      Marland Kitchen SIMVASTATIN 40 MG PO TABS Oral Take 40 mg by mouth every evening.      BP 140/78  Pulse 66  Temp 97.9 F (36.6 C) (Oral)  Resp 20  SpO2 100%  Physical Exam  Nursing note and vitals reviewed. Constitutional: He is oriented to person, place, and time. He appears well-developed and well-nourished. No distress.  HENT:  Head: Normocephalic and  atraumatic.  Eyes: Conjunctivae are normal.  Neck: Normal range of motion. Neck supple.  Cardiovascular: Normal rate, regular rhythm, normal heart sounds and intact distal pulses.  Exam reveals no gallop and no friction rub.   No murmur heard. Pulmonary/Chest: Effort normal and breath sounds normal. No respiratory distress. He has no wheezes. He has no rales. He exhibits no tenderness.  Abdominal: Soft. Bowel sounds are normal. He exhibits no distension and no mass. There is no tenderness. There is no rebound and no guarding.  Musculoskeletal: Normal range of motion. He exhibits no edema and no tenderness.  Neurological: He is alert and oriented to person, place, and time.  Skin: Skin is warm and dry. No rash noted. He is not diaphoretic. No erythema.  Psychiatric: He has a normal mood and affect. His behavior is normal. Judgment and thought content normal.       denies SI or HI    ED Course  Procedures (including critical care time)  Psych holding orders written, CIWA initiated. Will discuss case with Dr. Preston Fleeting and ACT to decide whether intensive OP vs IP would be more appropriate for patient.    Labs Reviewed  CBC - Abnormal; Notable for the following:    RBC 4.10 (*)     HCT 37.7 (*)     All other components within normal limits  COMPREHENSIVE METABOLIC PANEL - Abnormal; Notable for the following:    Sodium 127 (*)     Chloride 90 (*)     Glucose, Bld 125 (*)     AST 68 (*)     ALT 54 (*)     GFR calc non Af Amer 69 (*)     GFR calc Af Amer 79 (*)     All other components within normal limits  ETHANOL  URINE RAPID DRUG SCREEN (HOSP PERFORMED)   No results found.   1. Alcohol abuse   2. Depression       MDM  After being evaluated by ACT, patient desires to go to inpatient for detox from alcohol. VSS. Will continue to monitor in ER. Patient is voluntary.   Chronic hyponatremia but hydrating and giving banana bag while in ER.        [place], [place] 12/19/11  1453

## 2011-12-19 NOTE — BH Assessment (Signed)
Assessment Note   [name] is an 71 y.o. male that presents to [facility] requesting detox from alcohol.  Pt reported that he drinks between 6-9 beers per day, last drank 7 beers yesterday.  Pt stated he relapsed 2 months ago due to feeling depressed after 10 months sobriety.  Pt stated he has been sober since discharged from [facility] for detox last year in August.  Pt ahs not had any MH or SA treatment since detox.  Pt stated he has been feeling lonely and depressed and [place]  Pt has already made plans to do sone once goes through detox.  Pt reported he takes Klonopin as prescribed by his medical doctor for anxiety, but that this has not helped.  Pt stated he feels guilty and feels that he has to help care for his grandchildren that live nearby, as his daughter left his son-in-law and the grandchildren.  Otherwise, he stated, he would have gone to live with his brother that has invited him to do so.  Pt denies SI/HI or psychosis and is motivated for detox.  Pt reports current withdrawal symptoms include tremor and anxiety.  Completed assessment, assessment notification and faxed to [facility] to run for possible admission.  Updated ED staff.  Axis I: Major Depressive Disorder, Recurrent, Moderate, Alcohol Abuse, Cannabis Abuse Axis II: Deferred Axis III:  Past Medical History  Diagnosis Date  . Nonischemic cardiomyopathy     EF was 20% (60% last echo 2008)  . Tobacco abuse   . Carotid artery disease   . EtOH dependence   . Peptic ulcer disease   . Dyslipidemia    Axis IV: other psychosocial or environmental problems, problems related to social environment and problems with primary support group Axis V: 21-30 behavior considerably influenced by delusions or hallucinations OR serious impairment in judgment, communication OR inability to function in almost all areas  Past Medical History:  Past Medical History  Diagnosis Date  . Nonischemic cardiomyopathy     EF  was 20% (60% last echo 2008)  . Tobacco abuse   . Carotid artery disease   . EtOH dependence   . Peptic ulcer disease   . Dyslipidemia     Past Surgical History  Procedure Date  . Ulcer surgery   . Biventricular defibrillator implantation     [employer]    Family History: History reviewed. No pertinent family history.  Social History:  reports that he has been smoking Cigarettes.  He has a 22.5 pack-year smoking history. He quit smokeless tobacco use about 22 years ago. His smokeless tobacco use included Chew. He reports that he drinks about 1.8 ounces of alcohol per week. He reports that he uses illicit drugs (Marijuana).  Additional Social History:  Alcohol / Drug Use Pain Medications: na Prescriptions: see list Over the Counter: see list History of alcohol / drug use?: Yes Substance #1 Name of Substance 1: ETOH 1 - Age of First Use: 18 1 - Amount (size/oz): 8-8 12 oz beers 1 - Frequency: daily 1 - Duration: 2 months 1 - Last Use / Amount: yesterday - 7 beers Substance #2 Name of Substance 2: THC 2 - Age of First Use: 23 2 - Amount (size/oz): 2 puffs joint 2 - Frequency: daily 2 - Duration: 2 months 2 - Last Use / Amount: yesterday - 2 puffs off of a joint  CIWA: CIWA-Ar BP: 140/78 mmHg Pulse Rate: 66  Nausea and Vomiting:  no nausea and no vomiting Tactile Disturbances: none Tremor: not visible, but can be felt fingertip to fingertip Auditory Disturbances: not present Paroxysmal Sweats: no sweat visible Visual Disturbances: not present Anxiety: mildly anxious Headache, Fullness in Head: none present Agitation: normal activity Orientation and Clouding of Sensorium: oriented and can do serial additions CIWA-Ar Total: 2  COWS:    Allergies:  Allergies  Allergen Reactions  . Sudafed (Pseudoephedrine Hcl)   . Avelox (Moxifloxacin Hcl In Nacl)     Home Medications:  (Not in a hospital admission)  OB/GYN Status:  No LMP for male patient.  General  Assessment Data Location of Assessment: [facility] ED Living Arrangements: Alone Can pt return to current living arrangement?: Yes Admission Status: Voluntary Is patient capable of signing voluntary admission?: Yes Transfer from: Acute Hospital Referral Source: Self/Family/Friend  Education Status Is patient currently in school?: No  Risk to self Suicidal Ideation: No Suicidal Intent: No Is patient at risk for suicide?: No Suicidal Plan?: No Access to Means: No What has been your use of drugs/alcohol within the last 12 months?: daily use of alcohol and marijuana Previous Attempts/Gestures: No How many times?: 0  Other Self Harm Risks: pt denies Triggers for Past Attempts: Other (Comment) (na) Intentional Self Injurious Behavior: None (pt denies) Family Suicide History: Unknown Recent stressful life event(s): Other (Comment) (lives alone, limited support, relapsed) Persecutory voices/beliefs?: No Depression: Yes Depression Symptoms: Despondent;Tearfulness;Isolating;Fatigue;Loss of interest in usual pleasures;Guilt;Feeling worthless/self pity Substance abuse history and/or treatment for substance abuse?: Yes Suicide prevention information given to non-admitted patients: Not applicable  Risk to Others Homicidal Ideation: No Thoughts of Harm to Others: No Current Homicidal Intent: No Current Homicidal Plan: No Access to Homicidal Means: No Identified Victim: na History of harm to others?: No Assessment of Violence: None Noted Violent Behavior Description: na - pt calm, cooperative Does patient have access to weapons?: No Criminal Charges Pending?: No Does patient have a court date: No  Psychosis Hallucinations: None noted Delusions: None noted  Mental Status Report Appear/Hygiene: Other (Comment) (casual) Eye Contact: Good Motor Activity: Freedom of movement Speech: Logical/coherent;Soft Level of Consciousness: Alert Mood: Depressed;Anxious Affect: Appropriate to  circumstance Anxiety Level: Moderate Thought Processes: Coherent;Relevant Judgement: Unimpaired Orientation: Person;Place;Time;Situation Obsessive Compulsive Thoughts/Behaviors: None  Cognitive Functioning Concentration: Decreased Memory: Recent Intact;Remote Intact IQ: Average Insight: Fair Impulse Control: Fair Appetite: Poor Weight Loss:  (pt is unsure, but knows he has lost some weight) Weight Gain: 0  Sleep: No Change Total Hours of Sleep:  (6-8 hrs/night) Vegetative Symptoms: None  ADLScreening [facility] Assessment Services) Patient's cognitive ability adequate to safely complete daily activities?: Yes Patient able to express need for assistance with ADLs?: Yes Independently performs ADLs?: Yes  Abuse/Neglect [facility]) Physical Abuse: Denies Verbal Abuse: Denies Sexual Abuse: Denies  Prior Inpatient Therapy Prior Inpatient Therapy: Yes Prior Therapy Dates: 2002, 2012 Prior Therapy Facilty/Provider(s): [place], [facility] Reason for Treatment: Detox  Prior Outpatient Therapy Prior Outpatient Therapy: No Prior Therapy Dates: na Prior Therapy Facilty/Provider(s): na Reason for Treatment: na  ADL Screening (condition at time of admission) Patient's cognitive ability adequate to safely complete daily activities?: Yes Patient able to express need for assistance with ADLs?: Yes Independently performs ADLs?: Yes Weakness of Legs: None Weakness of Arms/Hands: None  Home Assistive Devices/Equipment Home Assistive Devices/Equipment: None    Abuse/Neglect Assessment (Assessment to be complete while patient is alone) Physical Abuse: Denies Verbal Abuse: Denies Sexual Abuse: Denies Exploitation of patient/patient's resources: Denies Self-Neglect: Denies Values / Beliefs Cultural Requests During Hospitalization:  None Spiritual Requests During Hospitalization: None Consults Spiritual Care Consult Needed: No Social Work Consult Needed: No Merchant navy officer (For  Healthcare) Advance Directive: Patient does not have advance directive;Patient would not like information    Additional Information 1:1 In Past 12 Months?: No CIRT Risk: No Elopement Risk: No Does patient have medical clearance?: Yes     Disposition:  Disposition Disposition of Patient: Referred to;Inpatient treatment program Type of inpatient treatment program: Adult Patient referred to: Other (Comment) (Pending [facility])  On Site Evaluation by:   Reviewed with Physician:  PA Drucie Opitz   Caryl Comes 12/19/2011 3:00 PM

## 2011-12-19 NOTE — ED Notes (Signed)
Pt requesting something for anxiety, given ativan as ordered PRN

## 2011-12-19 NOTE — ED Notes (Signed)
Pt presents to ED requesting detox from ETOH, pt completed detox last year around this time, pt recently relapsed d/t increased depression, pt states "he feels like he needs to stay in [place] for his grandchildren, pt reports he daughter left his son-in-law and 3 grandchildren and he feels it's his responsibility to stay and help take care of the grandchildren." Pt reports he would like to [place] to live with his brother, pt reports he has transportation by his brother to [place]. Pt reports he had arrangements made when he began feeling depressed and started back drinking. Pt reports drinking 9-10 beers/day, last drank yesterday afternoon. Pt denies SI/HI. Pt also reports feeling anxious and "jittery."

## 2011-12-19 NOTE — ED Notes (Signed)
ACT team at bedside.  

## 2011-12-20 ENCOUNTER — Inpatient Hospital Stay (HOSPITAL_COMMUNITY)
Admission: AD | Admit: 2011-12-20 | Discharge: 2011-12-24 | DRG: 897 | Disposition: A | Discharge status: Home / Self Care | Payer: 59 | Source: Ambulatory Visit | Attending: Emergency Medicine | Admitting: Emergency Medicine

## 2011-12-20 ENCOUNTER — Encounter (HOSPITAL_COMMUNITY): Payer: Self-pay

## 2011-12-20 DIAGNOSIS — R7401 Elevation of levels of liver transaminase levels: Secondary | ICD-10-CM | POA: Diagnosis present

## 2011-12-20 DIAGNOSIS — G4700 Insomnia, unspecified: Secondary | ICD-10-CM

## 2011-12-20 DIAGNOSIS — F1010 Alcohol abuse, uncomplicated: Secondary | ICD-10-CM

## 2011-12-20 DIAGNOSIS — F17200 Nicotine dependence, unspecified, uncomplicated: Secondary | ICD-10-CM

## 2011-12-20 DIAGNOSIS — F1210 Cannabis abuse, uncomplicated: Secondary | ICD-10-CM | POA: Diagnosis present

## 2011-12-20 DIAGNOSIS — I428 Other cardiomyopathies: Secondary | ICD-10-CM | POA: Diagnosis present

## 2011-12-20 DIAGNOSIS — I2510 Atherosclerotic heart disease of native coronary artery without angina pectoris: Secondary | ICD-10-CM | POA: Diagnosis present

## 2011-12-20 DIAGNOSIS — E871 Hypo-osmolality and hyponatremia: Secondary | ICD-10-CM

## 2011-12-20 DIAGNOSIS — E785 Hyperlipidemia, unspecified: Secondary | ICD-10-CM | POA: Diagnosis present

## 2011-12-20 DIAGNOSIS — R51 Headache: Secondary | ICD-10-CM | POA: Diagnosis not present

## 2011-12-20 DIAGNOSIS — I659 Occlusion and stenosis of unspecified precerebral artery: Secondary | ICD-10-CM | POA: Diagnosis present

## 2011-12-20 DIAGNOSIS — L659 Nonscarring hair loss, unspecified: Secondary | ICD-10-CM | POA: Diagnosis present

## 2011-12-20 DIAGNOSIS — K219 Gastro-esophageal reflux disease without esophagitis: Secondary | ICD-10-CM | POA: Diagnosis present

## 2011-12-20 DIAGNOSIS — Z8711 Personal history of peptic ulcer disease: Secondary | ICD-10-CM

## 2011-12-20 DIAGNOSIS — F411 Generalized anxiety disorder: Secondary | ICD-10-CM | POA: Diagnosis present

## 2011-12-20 DIAGNOSIS — Z79899 Other long term (current) drug therapy: Secondary | ICD-10-CM

## 2011-12-20 DIAGNOSIS — R7402 Elevation of levels of lactic acid dehydrogenase (LDH): Secondary | ICD-10-CM | POA: Diagnosis present

## 2011-12-20 DIAGNOSIS — F1020 Alcohol dependence, uncomplicated: Secondary | ICD-10-CM | POA: Diagnosis present

## 2011-12-20 DIAGNOSIS — F10988 Alcohol use, unspecified with other alcohol-induced disorder: Principal | ICD-10-CM | POA: Diagnosis present

## 2011-12-20 HISTORY — DX: Atherosclerotic heart disease of native coronary artery without angina pectoris: I25.10

## 2011-12-20 MED ORDER — ALUM & MAG HYDROXIDE-SIMETH 200-200-20 MG/5ML PO SUSP: 30.0000 mL | ORAL | Status: DC | PRN

## 2011-12-20 MED ORDER — LORATADINE 10 MG PO TABS
10.0000 mg | ORAL_TABLET | Freq: Every day | ORAL | Status: DC
  Administered 2011-12-21 – 2011-12-24 (×4): 10 mg via ORAL
  Filled 2011-12-20 (×8): qty 1

## 2011-12-20 MED ORDER — ACETAMINOPHEN 325 MG PO TABS: 650.0000 mg | ORAL_TABLET | Freq: Four times a day (QID) | ORAL | Status: DC | PRN

## 2011-12-20 MED ORDER — ATORVASTATIN CALCIUM 20 MG PO TABS
20.0000 mg | ORAL_TABLET | Freq: Every day | ORAL | Status: DC
  Administered 2011-12-21 – 2011-12-24 (×4): 20 mg via ORAL
  Filled 2011-12-20 (×7): qty 1

## 2011-12-20 MED ORDER — AMLODIPINE BESYLATE 5 MG PO TABS
5.0000 mg | ORAL_TABLET | Freq: Every day | ORAL | Status: DC
  Administered 2011-12-20: 5 mg via ORAL
  Filled 2011-12-20: qty 1

## 2011-12-20 MED ORDER — PANTOPRAZOLE SODIUM 40 MG PO TBEC
40.0000 mg | DELAYED_RELEASE_TABLET | Freq: Every day | ORAL | Status: DC
  Administered 2011-12-20: 40 mg via ORAL
  Filled 2011-12-20: qty 1

## 2011-12-20 MED ORDER — LISINOPRIL 40 MG PO TABS
40.0000 mg | ORAL_TABLET | Freq: Every day | ORAL | Status: DC
  Administered 2011-12-21 – 2011-12-24 (×4): 40 mg via ORAL
  Filled 2011-12-20 (×6): qty 1
  Filled 2011-12-20: qty 2
  Filled 2011-12-20: qty 1

## 2011-12-20 MED ORDER — LISINOPRIL 20 MG PO TABS
40.0000 mg | ORAL_TABLET | Freq: Every day | ORAL | Status: DC
  Administered 2011-12-20: 40 mg via ORAL
  Filled 2011-12-20: qty 2

## 2011-12-20 MED ORDER — ALBUTEROL SULFATE HFA 108 (90 BASE) MCG/ACT IN AERS
2.0000 | INHALATION_SPRAY | Freq: Four times a day (QID) | RESPIRATORY_TRACT | Status: DC | PRN
  Administered 2011-12-20: 2 via RESPIRATORY_TRACT
  Filled 2011-12-20: qty 6.7

## 2011-12-20 MED ORDER — AMLODIPINE BESYLATE 5 MG PO TABS
5.0000 mg | ORAL_TABLET | Freq: Every day | ORAL | Status: DC
  Administered 2011-12-21 – 2011-12-24 (×4): 5 mg via ORAL
  Filled 2011-12-20 (×8): qty 1

## 2011-12-20 MED ORDER — PANTOPRAZOLE SODIUM 40 MG PO TBEC
40.0000 mg | DELAYED_RELEASE_TABLET | Freq: Every day | ORAL | Status: DC
  Administered 2011-12-20 – 2011-12-23 (×4): 40 mg via ORAL
  Filled 2011-12-20 (×8): qty 1

## 2011-12-20 MED ORDER — SIMVASTATIN 40 MG PO TABS: 40.0000 mg | ORAL_TABLET | Freq: Every evening | ORAL | Status: DC

## 2011-12-20 MED ORDER — CLONAZEPAM 0.5 MG PO TABS: 1.0000 mg | ORAL_TABLET | Freq: Two times a day (BID) | ORAL | Status: DC | PRN

## 2011-12-20 MED ORDER — LORAZEPAM 1 MG PO TABS
1.0000 mg | ORAL_TABLET | Freq: Four times a day (QID) | ORAL | Status: DC | PRN
  Administered 2011-12-21: 1 mg via ORAL
  Filled 2011-12-20: qty 1

## 2011-12-20 MED ORDER — ATORVASTATIN CALCIUM 20 MG PO TABS
20.0000 mg | ORAL_TABLET | Freq: Every day | ORAL | Status: DC
  Administered 2011-12-20: 20 mg via ORAL
  Filled 2011-12-20: qty 1

## 2011-12-20 MED ORDER — ALBUTEROL SULFATE HFA 108 (90 BASE) MCG/ACT IN AERS: 2.0000 | INHALATION_SPRAY | Freq: Four times a day (QID) | RESPIRATORY_TRACT | Status: DC | PRN

## 2011-12-20 MED ORDER — METOPROLOL TARTRATE 25 MG PO TABS
100.0000 mg | ORAL_TABLET | Freq: Two times a day (BID) | ORAL | Status: DC
  Administered 2011-12-20: 100 mg via ORAL
  Filled 2011-12-20: qty 4

## 2011-12-20 MED ORDER — MAGNESIUM HYDROXIDE 400 MG/5ML PO SUSP: 30.0000 mL | Freq: Every day | ORAL | Status: DC | PRN

## 2011-12-20 NOTE — ED Provider Notes (Signed)
Medical screening examination/treatment/procedure(s) were performed by non-physician practitioner and as supervising physician I was immediately available for consultation/collaboration.   Dione Booze, MD 12/20/11 [phone]

## 2011-12-20 NOTE — ED Provider Notes (Signed)
  Physical Exam  BP 136/79  Pulse 70  Temp 98.1 F (36.7 C) (Oral)  Resp 18  SpO2 72%  Physical Exam  ED Course  Procedures  MDM Patient has had acceptance at behavioral health by Dr. Jackqulyn Livings.      Juliet Rude. Rubin Payor, MD 12/20/11 1610

## 2011-12-20 NOTE — BH Assessment (Signed)
Assessment Note   [name] is an 71 y.o. male that was reassessed this day.  Pt requesting detox from alcohol after relapsed 2 months ago.  Pt continues to request detox.  Pt endorses symptoms of depression and anxiety. Pt denies SI/HI or psychosis.  There is no change in pt's presenting symptoms or history at this time.  Received call from [facility] stating pt accepted pending an appropriate bed.  Completed reassessment, assessment notification and faxed to [facility] to log.  Updated ED staff.  Previous Note:  [name] is an 71 y.o. male that presents to [facility] requesting detox from alcohol. Pt reported that he drinks between 6-9 beers per day, last drank 7 beers yesterday. Pt stated he relapsed 2 months ago due to feeling depressed after 10 months sobriety. Pt stated he has been sober since discharged from [facility] for detox last year in August. Pt ahs not had any MH or SA treatment since detox. Pt stated he has been feeling lonely and depressed and [place]. Pt has already made plans to do sone once goes through detox. Pt reported he takes Klonopin as prescribed by his medical doctor for anxiety, but that this has not helped. Pt stated he feels guilty and feels that he has to help care for his grandchildren that live nearby, as his daughter left his son-in-law and the grandchildren. Otherwise, he stated, he would have gone to live with his brother that has invited him to do so. Pt denies SI/HI or psychosis and is motivated for detox. Pt reports current withdrawal symptoms include tremor and anxiety. Completed assessment, assessment notification and faxed to [facility] to run for possible admission. Updated ED staff.   Axis I: MDD, Recurrent, Moderate, Alcohol Abuse, Cannabis Abuse Axis II: Deferred Axis III:  Past Medical History  Diagnosis Date  . Nonischemic cardiomyopathy     EF was 20% (60% last echo 2008)  . Tobacco abuse   . Carotid artery disease   . EtOH  dependence   . Peptic ulcer disease   . Dyslipidemia    Axis IV: other psychosocial or environmental problems, problems related to social environment and problems with primary support group Axis V: 31-40 impairment in reality testing  Past Medical History:  Past Medical History  Diagnosis Date  . Nonischemic cardiomyopathy     EF was 20% (60% last echo 2008)  . Tobacco abuse   . Carotid artery disease   . EtOH dependence   . Peptic ulcer disease   . Dyslipidemia     Past Surgical History  Procedure Date  . Ulcer surgery   . Biventricular defibrillator implantation     [employer]    Family History: History reviewed. No pertinent family history.  Social History:  reports that he has been smoking Cigarettes.  He has a 22.5 pack-year smoking history. He quit smokeless tobacco use about 22 years ago. His smokeless tobacco use included Chew. He reports that he drinks about 1.8 ounces of alcohol per week. He reports that he uses illicit drugs (Marijuana).  Additional Social History:  Alcohol / Drug Use Pain Medications: na Prescriptions: see list Over the Counter: see list History of alcohol / drug use?: Yes Substance #1 Name of Substance 1: ETOH 1 - Age of First Use: 18 1 - Amount (size/oz): 8-8 12 oz beers 1 - Frequency: daily 1 - Duration: 2 months 1 - Last Use / Amount: yesterday - 7 beers  Substance #2 Name of Substance 2: THC 2 - Age of First Use: 23 2 - Amount (size/oz): 2 puffs joint 2 - Frequency: daily 2 - Duration: 2 months 2 - Last Use / Amount: yesterday - 2 puffs off of a joint  CIWA: CIWA-Ar BP: 132/78 mmHg Pulse Rate: 98  Nausea and Vomiting: no nausea and no vomiting Tactile Disturbances: none Tremor: not visible, but can be felt fingertip to fingertip Auditory Disturbances: not present Paroxysmal Sweats: no sweat visible Visual Disturbances: not present Anxiety: mildly anxious Headache, Fullness in Head: none present Agitation: normal  activity Orientation and Clouding of Sensorium: oriented and can do serial additions CIWA-Ar Total: 2  COWS:    Allergies:  Allergies  Allergen Reactions  . Sudafed (Pseudoephedrine Hcl)   . Avelox (Moxifloxacin Hcl In Nacl)     Home Medications:  (Not in a hospital admission)  OB/GYN Status:  No LMP for male patient.  General Assessment Data Location of Assessment: [facility] ED Living Arrangements: Alone Can pt return to current living arrangement?: Yes Admission Status: Voluntary Is patient capable of signing voluntary admission?: Yes Transfer from: Acute Hospital Referral Source: Self/Family/Friend  Education Status Is patient currently in school?: No  Risk to self Suicidal Ideation: No Suicidal Intent: No Is patient at risk for suicide?: No Suicidal Plan?: No Access to Means: No What has been your use of drugs/alcohol within the last 12 months?: daily use of alcohol Previous Attempts/Gestures: No How many times?: 0  Other Self Harm Risks: pt denies Triggers for Past Attempts: Other (Comment) (na) Intentional Self Injurious Behavior: None (pt denies) Family Suicide History: Unknown Recent stressful life event(s): Other (Comment) (lives alone, limited support, relapsed) Persecutory voices/beliefs?: No Depression: Yes Depression Symptoms: Despondent;Tearfulness;Isolating;Guilt;Loss of interest in usual pleasures;Feeling worthless/self pity Substance abuse history and/or treatment for substance abuse?: Yes Suicide prevention information given to non-admitted patients: Not applicable  Risk to Others Homicidal Ideation: No Thoughts of Harm to Others: No Current Homicidal Intent: No Current Homicidal Plan: No Access to Homicidal Means: No Identified Victim: na History of harm to others?: No Assessment of Violence: None Noted Violent Behavior Description: na - pt calm, cooperative Does patient have access to weapons?: No Criminal Charges Pending?: No Does patient  have a court date: No  Psychosis Hallucinations: None noted Delusions: None noted  Mental Status Report Appear/Hygiene: Other (Comment) (casual) Eye Contact: Good Motor Activity: Tremors Speech: Logical/coherent;Soft Level of Consciousness: Alert Mood: Depressed Affect: Appropriate to circumstance Anxiety Level: Moderate Thought Processes: Coherent;Relevant Judgement: Unimpaired Orientation: Person;Place;Time;Situation Obsessive Compulsive Thoughts/Behaviors: None  Cognitive Functioning Concentration: Decreased Memory: Recent Intact;Remote Intact IQ: Average Insight: Fair Impulse Control: Fair Appetite: Poor Weight Loss:  (pt is unsure, but knows he has lost weight) Weight Gain: 0  Sleep: No Change Total Hours of Sleep:  (6-8 hrs per night) Vegetative Symptoms: None  ADLScreening [facility] Assessment Services) Patient's cognitive ability adequate to safely complete daily activities?: Yes Patient able to express need for assistance with ADLs?: Yes Independently performs ADLs?: Yes  Abuse/Neglect [facility]) Physical Abuse: Denies Verbal Abuse: Denies Sexual Abuse: Denies  Prior Inpatient Therapy Prior Inpatient Therapy: Yes Prior Therapy Dates: 2002, 2012 Prior Therapy Facilty/Provider(s): [place] Reason for Treatment: Detox  Prior Outpatient Therapy Prior Outpatient Therapy: No Prior Therapy Dates: na Prior Therapy Facilty/Provider(s): na Reason for Treatment: na  ADL Screening (condition at time of admission) Patient's cognitive ability adequate to safely complete daily activities?: Yes Patient able to express need for assistance with ADLs?: Yes Independently performs  ADLs?: Yes Weakness of Legs: None Weakness of Arms/Hands: None  Home Assistive Devices/Equipment Home Assistive Devices/Equipment: None    Abuse/Neglect Assessment (Assessment to be complete while patient is alone) Physical Abuse: Denies Verbal Abuse: Denies Sexual Abuse:  Denies Exploitation of patient/patient's resources: Denies Self-Neglect: Denies Values / Beliefs Cultural Requests During Hospitalization: None Spiritual Requests During Hospitalization: None Consults Spiritual Care Consult Needed: No Social Work Consult Needed: No Merchant navy officer (For Healthcare) Advance Directive: Patient does not have advance directive;Patient would not like information    Additional Information 1:1 In Past 12 Months?: No CIRT Risk: No Elopement Risk: No Does patient have medical clearance?: Yes     Disposition:  Disposition Disposition of Patient: Referred to;Inpatient treatment program Type of inpatient treatment program: Adult Patient referred to: Other (Comment) ([facility])  On Site Evaluation by:   Reviewed with Physician:  Asencion Gowda, Rennis Harding 12/20/2011 11:10 AM

## 2011-12-20 NOTE — ED Notes (Signed)
REPORTS GIVEN TO BEHAVIOR HEALTH NURSE RM. 300-2 , WILL PREPARE PT. TO TRANSPORT.

## 2011-12-20 NOTE — Progress Notes (Signed)
Patient ID: [name], male   DOB: 1940-10-06, 71 y.o.   MRN: [id number] Pt denies SI/HI/AVH. Pt denies physical, verbal, or sexual abuse. Pt admitted voluntarily for ETOH detox. Pt state that he was sober for 1 year and relapsed about 2 months ago due to increased depression from loneliness and boredom. Pt states that he drinks 6-8 beers daily. Pt started drinking at age 42. UDS was also positive for marijuana. Pt lives alone and does have access to firearms. Pt wants to go to a rehab center.

## 2011-12-20 NOTE — Tx Team (Signed)
Initial Interdisciplinary Treatment Plan  PATIENT STRENGTHS: (choose at least two) Ability for insight Average or above average intelligence Capable of independent living Motivation for treatment/growth  PATIENT STRESSORS: Substance abuse   PROBLEM LIST: Problem List/Patient Goals Date to be addressed Date deferred Reason deferred Estimated date of resolution  Depression      Substance Abuse                                                 DISCHARGE CRITERIA:  Motivation to continue treatment in a less acute level of care  PRELIMINARY DISCHARGE PLAN: Outpatient therapy  PATIENT/FAMIILY INVOLVEMENT: This treatment plan has been presented to and reviewed with the patient, [name], and/or family member.  The patient and family have been given the opportunity to ask questions and make suggestions.  Gretta Arab [facility] 12/20/2011, 10:06 PM

## 2011-12-21 MED ORDER — CHLORDIAZEPOXIDE HCL 25 MG PO CAPS
25.0000 mg | ORAL_CAPSULE | Freq: Three times a day (TID) | ORAL | Status: DC | PRN
  Administered 2011-12-21 – 2011-12-22 (×3): 25 mg via ORAL
  Filled 2011-12-21 (×3): qty 1

## 2011-12-21 MED ORDER — ETOMIDATE 2 MG/ML IV SOLN
INTRAVENOUS | Status: AC
  Filled 2011-12-21: qty 20

## 2011-12-21 MED ORDER — LIDOCAINE HCL (CARDIAC) 20 MG/ML IV SOLN
INTRAVENOUS | Status: AC
  Filled 2011-12-21: qty 5

## 2011-12-21 MED ORDER — ROCURONIUM BROMIDE 50 MG/5ML IV SOLN
INTRAVENOUS | Status: AC
  Filled 2011-12-21: qty 2

## 2011-12-21 MED ORDER — TRAZODONE HCL 50 MG PO TABS: 50.0000 mg | ORAL_TABLET | Freq: Every day | ORAL | Status: DC

## 2011-12-21 MED ORDER — TRAZODONE HCL 50 MG PO TABS
25.0000 mg | ORAL_TABLET | Freq: Every day | ORAL | Status: DC
  Administered 2011-12-21 – 2011-12-23 (×3): 25 mg via ORAL
  Filled 2011-12-21 (×2): qty 0.5
  Filled 2011-12-21: qty 7
  Filled 2011-12-21 (×3): qty 0.5
  Filled 2011-12-21: qty 7

## 2011-12-21 MED ORDER — SUCCINYLCHOLINE CHLORIDE 20 MG/ML IJ SOLN
INTRAMUSCULAR | Status: AC
  Filled 2011-12-21: qty 10

## 2011-12-21 NOTE — BHH Suicide Risk Assessment (Signed)
Suicide Risk Assessment  Admission Assessment     Demographic factors:  See chart.  Current Mental Status: Patient seen and evaluated. Chart reviewed. Patient stated that his mood was "ok". His affect was mood congruent and euthymic. He denied any current thoughts of self injurious behavior, suicidal ideation or homicidal ideation. He denied any significant depressive signs or symptoms at this time. There were no auditory or visual hallucinations, paranoia, delusional thought processes, or mania noted.  Thought process was linear and goal directed.  No psychomotor agitation or retardation was noted. His speech was slowed rate, yet with nl tone and volume. Eye contact was good. Judgment and insight are fair.  Patient has been up and engaged on the unit.  No acute safety concerns reported from team.  Loss Factors: Health  Historical Factors: Family history of mental illness or substance abuse  Risk Reduction Factors: Sense of responsibility to family; may stay with brother s/p d/c; AA; Sponsor  CLINICAL FACTORS: Alcohol Dependence; Cannabis Abuse; HTN; HL; Hyponatremia (pt states that he chronically runs between 127-130); Transaminitis; GERD; Seasonal Allergies; Insomnia; Alopecia  COGNITIVE FEATURES THAT CONTRIBUTE TO RISK: nl cognitive decline with age.  SUICIDE RISK: Patient is currently viewed as a low risk of harm to himself and others in light of his history and risk factors. There are no acute safety concerns noted on the unit.    PLAN OF CARE: Pt admitted for crisis stabilization and treatment.  No detox taper indicated, pt has not used in 4 days.  Initiated low dose trazodone for sleep.  Discussed potential residential Tx s/p acute stabilization.  Please see orders.  Medications reviewed with pt and medication education provided.  Will continue q15 minute checks per unit protocol.  No clinical indication for one on one level of observation at this time.  Pt contracting for safety.  Mental  health treatment, medication management and continued sobriety will mitigate against the potential increased risk of harm to self and/or others.  Discussed the importance of recovery with pt, as well as, tools to move forward in a healthy & safe manner.  Pt agreeable with the plan.  Discussed with the team.   Lupe Carney 12/21/2011, 1:36 PM

## 2011-12-21 NOTE — Progress Notes (Signed)
Adult Psychosocial Assessment Update Interdisciplinary Team  Previous [facility] admissions/discharges:  Admissions Discharges  Date: 8/15 2012 Date: 02/05/2011  Date: Date:  Date: Date:  Date: Date:  Date: Date:   Changes since the last Psychosocial Assessment (including adherence to outpatient mental health and/or substance abuse treatment, situational issues contributing to decompensation and/or relapse).  Patient reports he still lives on same property although daughter has left husband and  Patient lives next door to son in law and 2 grandchildren. Patient maintained 8 months   of sobriety after discharge from [facility] in August of 2012.  Current relapse on alcohol   for two months; patient drinking 6-9 beers daily.  Pt reports relapse preceded by de-   creased attendance at [facility].No longer attending any meetings at this point, tendency is   to isolarte.   Discharge Plan 1. Will you be returning to the same living situation after discharge?   Yes: X No:      If no, what is your plan?           2. Would you like a referral for services when you are discharged? Yes:  X   If yes, for what services?  No:       [facility] and Recommendations (to be completed by the evaluator)   Patient is 71 YO retired caucasian male admitted with diagnosis of Major Depressive    Disorder, Recurrent, Moderate, Alcohol Abuse and Cannabis Abuse. Patient will     Benefit from crisis stabilization, medication evaluation, group therapy and psycho edu-    Cation, in addition to case management for discharge planning.                  Signature:  Clide Dales, 12/21/2011 10:43 AM

## 2011-12-21 NOTE — Discharge Planning (Signed)
New patient attended AM group, good participation.  States he is here for alcohol detox.  He was last here about a year ago, at which time we detoxed him from benzos and alcohol and sent him home with the admonition to attend AA groups and get a sponsor.  He admitted to never really going regularly to meetings, but states he learned his lesson.  [place]  He went to stay with his sister last time, and this time he plans to go stay with his brother.  Follow up at [facility].

## 2011-12-21 NOTE — H&P (Signed)
Psychiatric Admission Assessment Adult  Patient Identification:  [name] Date of Evaluation:  12/21/2011 Chief Complaint:  MDD, Recurrent, Moderate; Alcohol Abuse History of Present Illness: This is a voluntary admission for this 71 yr old DWM who presented to [facility] requesting detox from alcohol and reporting some depression.  He Koch that he has relapsed and has been drinking more and more beer each day for the last 1-2 months.  [name] that he has had decreased sleep, poor appetite, with increasing depression which he rates at a 6-7/10.  He denies suicidality, or any previous attempts at suicide.  No homocidality, no AH/VH.  He denies anxiety or hopelessness.  Past Psychiatric History: Diagnosis:  Alcohol detox 1 yr ago  Hospitalizations:  [facility]  Outpatient Care: none  Substance Abuse Care:  none  Self-Mutilation:   none  Suicidal Attempts:  none  Violent Behaviors:  none   Past Medical History:   Past Medical History  Diagnosis Date  . Nonischemic cardiomyopathy     EF was 20% (60% last echo 2008)  . Tobacco abuse   . Carotid artery disease   . EtOH dependence   . Peptic ulcer disease   . Dyslipidemia   . CAD (coronary artery disease)    Cardiac History:  see Allergies:   Allergies  Allergen Reactions  . Sudafed (Pseudoephedrine Hcl)   . Avelox (Moxifloxacin Hcl In Nacl)    PTA Medications: Prescriptions prior to admission  Medication Sig Dispense Refill  . albuterol (PROVENTIL HFA;VENTOLIN HFA) 108 (90 BASE) MCG/ACT inhaler Inhale 2 puffs into the lungs every 6 (six) hours as needed. For shortness of breath.      Marland Kitchen amLODipine (NORVASC) 5 MG tablet Take 5 mg by mouth daily.        Marland Kitchen atorvastatin (LIPITOR) 20 MG tablet Take 20 mg by mouth daily.      . cetirizine (ZYRTEC) 10 MG tablet Take 10 mg by mouth daily.        . clonazePAM (KLONOPIN) 1 MG tablet Take 1 mg by mouth 2 (two) times daily as needed. For anxiety.      Marland Kitchen lisinopril (PRINIVIL,ZESTRIL) 40  MG tablet Take 40 mg by mouth daily.        . metoprolol (LOPRESSOR) 100 MG tablet Take 100 mg by mouth 2 (two) times daily.        Marland Kitchen omeprazole (PRILOSEC) 20 MG capsule Take 20 mg by mouth 2 (two) times daily.        . simvastatin (ZOCOR) 40 MG tablet Take 40 mg by mouth every evening.        Previous Psychotropic Medications: none  Medication/Dose    See med list               Substance Abuse History in the last 12 months:  See HPI    Consequences of Substance Abuse: Withdrawal Symptoms:   Diaphoresis Headaches Nausea Tremors None  Social History: Current Place of Residence:   Place of Birth:   Family Members: Marital Status:  Divorced Children:  Sons:  Daughters: Relationships: Education:  Corporate treasurer Problems/Performance: Religious Beliefs/Practices: History of Abuse (Emotional/Phsycial/Sexual) Teacher, music History:  Data processing manager History: Hobbies/Interests:  Family History:  History reviewed. No pertinent family history. ROS: Negative with the exception of HPI. PE; Completed by the MD in the ED. I have reviewed those results and evaluated the patient.  Mental Status Examination/Evaluation: Objective:  Appearance: Casual  Eye Contact::  Fair  Speech:  Clear and Coherent  Volume:  Normal  Mood:  Euthymic  Affect:  Congruent  Thought Process:  Coherent  Orientation:  Full  Thought Content:  WDL  Suicidal Thoughts:  No  Homicidal Thoughts:  No  Memory:  Immediate;   Fair  Judgement:  Intact  Insight:  Lacking  Psychomotor Activity:  Normal  Concentration:  Fair  Recall:  Fair  Akathisia:  No  Handed:  Right  AIMS (if indicated):     Assets:    Sleep:  Number of Hours: 2.25     Laboratory/X-Ray Psychological Evaluation(s)      Assessment:    AXIS I:  Alcohol dependence, SIMDO AXIS II:  Deferred AXIS III:   Past Medical History  Diagnosis Date  . Nonischemic cardiomyopathy     EF was 20% (60% last echo  2008)  . Tobacco abuse   . Carotid artery disease   . EtOH dependence   . Peptic ulcer disease   . Dyslipidemia   . CAD (coronary artery disease)    AXIS IV:  problems with access to health care services and problems with primary support group AXIS V:  51-60 moderate symptoms  Treatment Plan: 1. Admit for detox.  2. Treat health problems as indicated. 3. Address need for follow up care upon discharge.  Treatment Plan Summary: Daily contact with patient to assess and evaluate symptoms and progress in treatment Medication management Current Medications:  Current Facility-Administered Medications  Medication Dose Route Frequency Provider Last Rate Last Dose  . albuterol (PROVENTIL HFA;VENTOLIN HFA) 108 (90 BASE) MCG/ACT inhaler 2 puff  2 puff Inhalation Q6H PRN Curlene Labrum Readling, MD      . alum & mag hydroxide-simeth (MAALOX/MYLANTA) 200-200-20 MG/5ML suspension 30 mL  30 mL Oral Q4H PRN Curlene Labrum Readling, MD      . amLODipine (NORVASC) tablet 5 mg  5 mg Oral Daily Curlene Labrum Readling, MD   5 mg at 12/21/11 0803  . atorvastatin (LIPITOR) tablet 20 mg  20 mg Oral Daily Curlene Labrum Readling, MD   20 mg at 12/21/11 0825  . lisinopril (PRINIVIL,ZESTRIL) tablet 40 mg  40 mg Oral Daily Curlene Labrum Readling, MD   40 mg at 12/21/11 0803  . loratadine (CLARITIN) tablet 10 mg  10 mg Oral Daily Curlene Labrum Readling, MD   10 mg at 12/21/11 0803  . magnesium hydroxide (MILK OF MAGNESIA) suspension 30 mL  30 mL Oral Daily PRN Curlene Labrum Readling, MD      . pantoprazole (PROTONIX) EC tablet 40 mg  40 mg Oral Q1200 Curlene Labrum Readling, MD   40 mg at 12/21/11 1206  . traZODone (DESYREL) tablet 25 mg  25 mg Oral QHS Alyson Kuroski-Mazzei, DO      . DISCONTD: acetaminophen (TYLENOL) tablet 650 mg  650 mg Oral Q6H PRN Ronny Bacon, MD      . DISCONTD: LORazepam (ATIVAN) tablet 1 mg  1 mg Oral Q6H PRN Curlene Labrum Readling, MD   1 mg at 12/21/11 0334  . DISCONTD: traZODone (DESYREL) tablet 50 mg  50 mg Oral QHS Alyson  Kuroski-Mazzei, DO       Facility-Administered Medications Ordered in Other Encounters  Medication Dose Route Frequency Provider Last Rate Last Dose  . nicotine (NICODERM CQ - dosed in mg/24 hours) patch 21 mg  21 mg Transdermal Once Hilario Quarry, MD   21 mg at 12/19/11 1807  . DISCONTD: acetaminophen (TYLENOL) tablet 650 mg  650 mg Oral Q4H  PRN Drucie Opitz, PA      . DISCONTD: albuterol (PROVENTIL HFA;VENTOLIN HFA) 108 (90 BASE) MCG/ACT inhaler 2 puff  2 puff Inhalation Q6H PRN Charles B. Bernette Mayers, MD   2 puff at 12/20/11 1247  . DISCONTD: amLODipine (NORVASC) tablet 5 mg  5 mg Oral Daily Charles B. Bernette Mayers, MD   5 mg at 12/20/11 1245  . DISCONTD: atorvastatin (LIPITOR) tablet 20 mg  20 mg Oral Daily Charles B. Bernette Mayers, MD   20 mg at 12/20/11 1245  . DISCONTD: clonazePAM (KLONOPIN) tablet 1 mg  1 mg Oral BID PRN Charles B. Bernette Mayers, MD      . DISCONTD: etomidate (AMIDATE) 2 MG/ML injection           . DISCONTD: folic acid (FOLVITE) tablet 1 mg  1 mg Oral Daily Bethany Hunt, PA   1 mg at 12/20/11 0956  . DISCONTD: ibuprofen (ADVIL,MOTRIN) tablet 600 mg  600 mg Oral Q8H PRN Drucie Opitz, PA      . DISCONTD: lidocaine (cardiac) 100 mg/74ml (XYLOCAINE) 20 MG/ML injection 2%           . DISCONTD: lisinopril (PRINIVIL,ZESTRIL) tablet 40 mg  40 mg Oral Daily Charles B. Bernette Mayers, MD   40 mg at 12/20/11 1244  . DISCONTD: LORazepam (ATIVAN) injection 1 mg  1 mg Intravenous Q6H PRN Drucie Opitz, PA      . DISCONTD: LORazepam (ATIVAN) tablet 1 mg  1 mg Oral Q6H PRN Drucie Opitz, PA   1 mg at 12/20/11 1124  . DISCONTD: metoprolol tartrate (LOPRESSOR) tablet 100 mg  100 mg Oral BID Charles B. Bernette Mayers, MD   100 mg at 12/20/11 1246  . DISCONTD: multivitamin with minerals tablet 1 tablet  1 tablet Oral Daily Drucie Opitz, PA   1 tablet at 12/20/11 0956  . DISCONTD: ondansetron (ZOFRAN) tablet 4 mg  4 mg Oral Q8H PRN Drucie Opitz, PA      . DISCONTD: pantoprazole (PROTONIX) EC tablet 40 mg  40 mg Oral Q1200  Charles B. Bernette Mayers, MD   40 mg at 12/20/11 1244  . DISCONTD: rocuronium (ZEMURON) 50 MG/5ML injection           . DISCONTD: succinylcholine (ANECTINE) 20 MG/ML injection           . DISCONTD: thiamine (B-1) injection 100 mg  100 mg Intravenous Daily Drucie Opitz, PA      . DISCONTD: thiamine (VITAMIN B-1) tablet 100 mg  100 mg Oral Daily Bethany Hunt, PA   100 mg at 12/20/11 0956  . DISCONTD: zolpidem (AMBIEN) tablet 5 mg  5 mg Oral QHS PRN Drucie Opitz, PA   5 mg at 12/20/11 1954    Observation Level/Precautions:  Detox  Laboratory:    Psychotherapy:    Medications:    Routine PRN Medications:  Yes  Consultations:    Discharge Concerns:    Other:     Lloyd Huger T. Raymie Trani PAC For Dr. Lupe Carney 7/3/20132:51 PM

## 2011-12-21 NOTE — Progress Notes (Signed)
Psychoeducational Group Note  Date:  12/21/2011 Time:  2216   Group Topic/Focus:  Wrap-Up Group:   The focus of this group is to help patients review their daily goal of treatment and discuss progress on daily workbooks.  Participation Level:  Minimal  Participation Quality:  Attentive  Affect:  Appropriate  Cognitive:  Appropriate  Insight:  Good  Engagement in Group:  Good  Additional Comments:  Pt attended NA group this evening.  Pt was appropriate and attentive.  Aundria Rud, Praise Stennett L 12/21/2011, 10:16 PM

## 2011-12-21 NOTE — Progress Notes (Signed)
[facility] Group Notes:  (Counselor/Nursing/MHT/Case Management/Adjunct)  12/21/2011 4:01 PM   Type of Therapy:  Group Therapy at 1:15 to 2:30  Participation Level:  Minimal  Participation Quality:  Attentive  Affect:  Appropriate  Cognitive:  Alert and Oriented  Insight:  None shared  Engagement in Group:  Limited  Engagement in Therapy:  Limited  Modes of Intervention:  Socialization and Support  Summary of Progress/Problems:  Group session in which patients explored emotional triggers and reactions verses emotional regulation. Several patients were able to make distinctions between a reaction and a response. Patient came in late to group after meeting with medical staff yet was attentive and spoke at length to counselor after group.   Clide Dales

## 2011-12-21 NOTE — Progress Notes (Signed)
D   Pt is in bed resting with his eyes closed  No distress noted   Respirations are even and unlabored A   Q 15 min checks  Will continue to monitor R   Pt safe at present time

## 2011-12-21 NOTE — Progress Notes (Signed)
D:  Patient reports sleeping poorly last night and states that his appetite is poor.  He rate his depression at 5/10 and hopelessness at 5/10.  He denies SI/HI/AVH.  He complains of a mild headache, but refuses any medications.  CIWA scores have been 2.  He is participating in groups and interacting appropriately with staff and other patients.  He plans to discharge to live with brother at the [facility] and he exhibits a positive attitude about his plans.    A:  Medications given as ordered.  Safety checks q 15 minutes.    R:  Safety maintained on unit.

## 2011-12-21 NOTE — Progress Notes (Signed)
[facility] Group Notes:  (Counselor/Nursing/MHT/Case Management/Adjunct)  12/21/2011 2:07 PM  Type of Therapy:  Psychoeducational Skills  Participation Level:  Active  Participation Quality:  Appropriate  Affect:  Appropriate  Cognitive:  Appropriate  Insight:  Good  Engagement in Group:  Good  Engagement in Therapy:  Good  Modes of Intervention:  Support  Summary of Progress/Problems:   Christ Kick 12/21/2011, 2:07 PM

## 2011-12-21 NOTE — Progress Notes (Signed)
D: Pt has been up in dayroom interacting well with peers & staff.Pt. Denies SI,HI & AVH.CIWA=1.Pt c/o mild anxiety.Pt. Is pleasant & cooperative..A:Pt. Was medicated with librium 25 mg. For anxiety.Continues on 15 minute checks.R pt safety maintained.

## 2011-12-22 DIAGNOSIS — F1010 Alcohol abuse, uncomplicated: Secondary | ICD-10-CM

## 2011-12-22 DIAGNOSIS — G4700 Insomnia, unspecified: Secondary | ICD-10-CM

## 2011-12-22 MED ORDER — MAGNESIUM HYDROXIDE 400 MG/5ML PO SUSP: 30.0000 mL | Freq: Every evening | ORAL | Status: DC | PRN

## 2011-12-22 MED ORDER — AZITHROMYCIN 250 MG PO TABS
250.0000 mg | ORAL_TABLET | Freq: Every day | ORAL | Status: DC
  Administered 2011-12-23 – 2011-12-24 (×2): 250 mg via ORAL
  Filled 2011-12-22 (×5): qty 1

## 2011-12-22 MED ORDER — CHLORDIAZEPOXIDE HCL 25 MG PO CAPS: 25.0000 mg | ORAL_CAPSULE | Freq: Two times a day (BID) | ORAL | Status: DC | PRN

## 2011-12-22 MED ORDER — AZITHROMYCIN 500 MG PO TABS
500.0000 mg | ORAL_TABLET | Freq: Every day | ORAL | Status: AC
  Administered 2011-12-22: 500 mg via ORAL
  Filled 2011-12-22: qty 1
  Filled 2011-12-22: qty 2

## 2011-12-22 MED ORDER — ACETAMINOPHEN 325 MG PO TABS
650.0000 mg | ORAL_TABLET | Freq: Four times a day (QID) | ORAL | Status: DC | PRN
  Administered 2011-12-22: 650 mg via ORAL

## 2011-12-22 MED ORDER — CHLORDIAZEPOXIDE HCL 25 MG PO CAPS
25.0000 mg | ORAL_CAPSULE | Freq: Three times a day (TID) | ORAL | Status: DC | PRN
  Administered 2011-12-22 – 2011-12-24 (×5): 25 mg via ORAL
  Filled 2011-12-22 (×5): qty 1

## 2011-12-22 NOTE — Progress Notes (Signed)
Psychoeducational Group Note  Date:  12/22/2011 Time:  11:00  Group Topic/Focus:  Overcoming Stress:   The focus of this group is to define stress and help patients assess their triggers.  Participation Level:  Minimal  Participation Quality:  Appropriate and Attentive  Affect:  Depressed and Flat  Cognitive:  Alert and Appropriate  Insight:  Good  Engagement in Group:  Limited  Additional Comments:  Pt stayed to side of activities and shared when prompted by this staff.  Pt shared that he was always anxious and could not identify any stressors in his life. Pt shared that he likes to garden to decrease his anxiety.  Gwyndolyn Kaufman 12/22/2011, 1:59 PM

## 2011-12-22 NOTE — Progress Notes (Signed)
Patient ID: [name], male   DOB: 1941/01/26, 71 y.o.   MRN: [id number]  09/25/2011 11:34 AM  Diagnosis:  Axis I: Alcohol Abuse  ADL's:  Intact  Sleep: Fair  Appetite:  Fair  Suicidal Ideation: No  Homicidal Ideation: No   Subjective:  Pt complains of Left sided headache with nasal congestion and PND, feels that he is getting a sinus infection.  He notes a dull headache but has yet to ask for Tylenol. "don't want to bother anybody..."  "everybody is so busy..."  Mental Status Examination/Evaluation: Objective:  Appearance: Fairly Groomed  Eye Contact::  Good  Speech:  Clear and Coherent  Volume:  Normal  Mood:  Anxious  Affect:  Congruent  Thought Process:  Coherent  Orientation:  Full  Thought Content:  WDL  Suicidal Thoughts:  No  Homicidal Thoughts:  No  Memory:  Recent;   Fair  Judgement:  Impaired  Insight:  Lacking  Psychomotor Activity:  Normal  Concentration:  Fair  Recall:  Fair  Akathisia:  No  Handed:  Right  AIMS (if indicated):     Assets:    Sleep:  Number of Hours: 6.25    Vital Signs:  height is 5\' 8"  (1.727 m) and weight is 62.143 kg (137 lb). His oral temperature is 98.2 F (36.8 C). His blood pressure is 123/81 and his pulse is 114. His respiration is 16.   Current Medications: Current Facility-Administered Medications   . amLODipine  5 mg Oral Daily  . atorvastatin  20 mg Oral Daily  . azithromycin  500 mg Oral Daily   Followed by  . azithromycin  250 mg Oral Daily  . lisinopril  40 mg Oral Daily  . loratadine  10 mg Oral Daily  . pantoprazole  40 mg Oral Q1200  . traZODone  25 mg Oral QHS   ab Results: No results found for this or any previous visit (from the past 48 hour(s)).  Physical Findings: AIMS:   CIWA:  CIWA-Ar Total: not being done. COWS:   Not being done. Treatment Plan Summary:  1. Daily contact with patient to assess and evaluate symptoms and progress in treatment.  2. Medication management  3. The patient will  deny suicidal ideations or homicidal ideations for 48 hours prior to discharge and have a depression and anxiety rating of 3 or less. The patient will also deny any auditory or visual hallucinations or delusional thinking.  4. The patient will deny any symptoms of substance withdrawal at time of discharge.    Plan:  1. Medications, VS, Chart and labs all reviewed. Patient evaluated. Labs ordered, medications changed, old records were requested, spoke with family member, discussed treatment plan with CM.  2. Zithromax ordered for sinus infection.  3. Pt. To take tylenol for pain.  4. Will do a Librium 2 day taper due to increased HR. 5. Continue to follow.  Rona Ravens. Judyth Demarais PAC

## 2011-12-22 NOTE — Progress Notes (Signed)
Patient resting quietly with eyes closed. Respirations even and unlabored. No distress noted. Q 15 minute check continues to maintain safety   

## 2011-12-22 NOTE — Progress Notes (Deleted)
D: Patient presents as pleasant and cooperative. Interacting well with peers in milieu. Reports sleep as fair, 6.5 hours documented for sleep last night. Energy level is normal, appetite improving and ability to pay attention is improving. Depression rates at 9 and hopelessness rated at 2. Endorses cravings and agitation, denies SI/HI. Reports headaches and pain in the last 24 hours but denies pain at assessment.  A: Encouraged and supported to review coping skills. Patient also encouraged to attend all groups today. R: Receptive to staff interaction and encouragement. Attending groups. Steed Kanaan L Ovetta Bazzano RN MS EdS 12/22/2011  9:20 AM   

## 2011-12-22 NOTE — Progress Notes (Signed)
D: Patient presents with flat depressed affect. Rates a sense of hopelessness as 10/10. Denies SI/HI. Slept fair, appetite improving and states that he eats 80% of his meals. Looking forward to fishing more at discharge and not isolating. A: Encouraged to review coping skills and to attend all groups today. R: Patient receptive to staff interaction and encouragement. Joice Lofts RN MS EdS 12/22/2011  9:47 AM

## 2011-12-23 ENCOUNTER — Encounter (HOSPITAL_COMMUNITY): Payer: Self-pay | Admitting: Emergency Medicine

## 2011-12-23 LAB — BASIC METABOLIC PANEL
Calcium: 9.9 mg/dL (ref 8.4–10.5)
Chloride: 93 mEq/L — ABNORMAL LOW (ref 96–112)
Creatinine, Ser: 0.88 mg/dL (ref 0.50–1.35)
GFR calc Af Amer: >90 mL/min (ref >90)
Sodium: 128 mEq/L — ABNORMAL LOW (ref 135–145)

## 2011-12-23 MED ORDER — SODIUM CHLORIDE 0.9 % IV BOLUS (SEPSIS)
1000.0000 mL | Freq: Once | INTRAVENOUS | Status: AC
  Administered 2011-12-23: 1000 mL via INTRAVENOUS

## 2011-12-23 MED ORDER — SODIUM CHLORIDE 0.9 % IV BOLUS (SEPSIS): 1000.0000 mL | Freq: Once | INTRAVENOUS | Status: DC

## 2011-12-23 NOTE — Progress Notes (Signed)
S/O: Patient seen and evaluated. Chart reviewed. Patient stated that his mood was "ok", but had c/o R hand "tingling, contractures and cramping".  His affect was mood congruent and anxious. He denied any current thoughts of self injurious behavior, suicidal ideation or homicidal ideation. He denied any significant depressive signs or symptoms at this time. There were no auditory or visual hallucinations, paranoia, delusional thought processes, or mania noted. Thought process was linear and goal directed. No psychomotor agitation or retardation was noted. His speech was slowed rate, yet with nl tone and volume. Eye contact was good. Judgment and insight are fair. Patient has been up and engaged on the unit. No acute safety concerns reported from team. Pt good with plan to send to Ed for stat labs and w/u.  Discussed with team.  No c/o CP/SOB/N/V/D/Withdrawal s/s; vitals redrawn and HR/BP was wnl.  Vitals:  Filed Vitals:   12/23/11 0701  BP: 126/72  Pulse: 108  Temp:   Resp:     Meds:    . amLODipine  5 mg Oral Daily  . atorvastatin  20 mg Oral Daily  . azithromycin  250 mg Oral Daily  . lisinopril  40 mg Oral Daily  . loratadine  10 mg Oral Daily  . pantoprazole  40 mg Oral Q1200  . traZODone  25 mg Oral QHS    A/P: Alcohol Dependence; Cannabis Abuse; HTN; HL; Hyponatremia (pt states that he chronically runs between 127-130); Transaminitis; GERD; Seasonal Allergies; Insomnia; Alopecia   Pt being transferred to [facility] ED for medical clearance.  Stat labs and potential imaging; r/o hypokalemia; further hyponatremia; CVA; and/or seizure activity.  Pt agreeable with plan.  Discussed with Dr. Allena Katz.

## 2011-12-23 NOTE — ED Notes (Signed)
Attempted to call report for [facility]. Nurses are changing shift, will call back later.

## 2011-12-23 NOTE — ED Notes (Signed)
Attempted to call report to [facility].  RN unavailable to take it and will call back shortly.

## 2011-12-23 NOTE — ED Provider Notes (Signed)
History     CSN: [id number]  Arrival date & time 12/23/11  1612   First MD Initiated Contact with Patient 12/23/11 1703      Chief Complaint  Patient presents with  . Abnormal Lab    The history is provided by the patient and medical records.   the patient has [facility] at the behavior [facility] for [facility] for alcohol abuse where he is currently undergoing detox.  The patient is without complaints at this time.  He was sent to the emergency department for evaluation from inpatient setting for a sodium of 127 noted on his laboratory tests yesterday.  Because the patient had some cramping in his hands that started yesterday he was sent to the emergency department.  Review of the patient's prior records demonstrate that he always has a sodium in the high 120s for approximately the past 4 years when he has been evaluated in the emergency department.  The patient denies chest pain shortness breath.  He has no abdominal pain.  He reports normal intake while hospitalized at [facility].  He does report that his urine has been slightly concentrated.  Past Medical History  Diagnosis Date  . Nonischemic cardiomyopathy     EF was 20% (60% last echo 2008)  . Tobacco abuse   . Carotid artery disease   . EtOH dependence   . Peptic ulcer disease   . Dyslipidemia   . CAD (coronary artery disease)     Past Surgical History  Procedure Date  . Ulcer surgery   . Biventricular defibrillator implantation     [employer]    History reviewed. No pertinent family history.  History  Substance Use Topics  . Smoking status: Current Everyday Smoker -- 0.2 packs/day for 45 years    Types: Cigarettes  . Smokeless tobacco: Former Neurosurgeon    Types: Chew    Quit date: 06/20/1989   Comment: he is not interested in cessation  . Alcohol Use: 3.6 oz/week    6 Cans of beer per week     drings 4-6 beers per day      Review of Systems  All other systems reviewed and are  negative.    Allergies  Sudafed and Avelox  Home Medications   Current Outpatient Rx  Name Route Sig Dispense Refill  . ALBUTEROL SULFATE HFA 108 (90 BASE) MCG/ACT IN AERS Inhalation Inhale 2 puffs into the lungs every 6 (six) hours as needed. For shortness of breath.    . AMLODIPINE BESYLATE 5 MG PO TABS Oral Take 5 mg by mouth daily.      . ATORVASTATIN CALCIUM 20 MG PO TABS Oral Take 20 mg by mouth daily.    Marland Kitchen CETIRIZINE HCL 10 MG PO TABS Oral Take 10 mg by mouth daily.      Marland Kitchen CLONAZEPAM 1 MG PO TABS Oral Take 1 mg by mouth 2 (two) times daily as needed. For anxiety.    Marland Kitchen LISINOPRIL 40 MG PO TABS Oral Take 40 mg by mouth daily.      Marland Kitchen METOPROLOL TARTRATE 100 MG PO TABS Oral Take 100 mg by mouth 2 (two) times daily.      Marland Kitchen OMEPRAZOLE 20 MG PO CPDR Oral Take 20 mg by mouth 2 (two) times daily.      Marland Kitchen SIMVASTATIN 40 MG PO TABS Oral Take 40 mg by mouth every evening.      BP 152/65  Pulse 92  Temp 97.7 F (36.5 C) (  Oral)  Resp 20  Ht 5\' 8"  (1.727 m)  Wt 137 lb (62.143 kg)  BMI 20.83 kg/m2  SpO2 100%  Physical Exam  Nursing note and vitals reviewed. Constitutional: He is oriented to person, place, and time. He appears well-developed and well-nourished.  HENT:  Head: Normocephalic and atraumatic.  Eyes: EOM are normal.  Neck: Normal range of motion.  Cardiovascular: Normal rate, regular rhythm, normal heart sounds and intact distal pulses.   Pulmonary/Chest: Effort normal and breath sounds normal. No respiratory distress.  Abdominal: Soft. He exhibits no distension. There is no tenderness.  Musculoskeletal: Normal range of motion.  Neurological: He is alert and oriented to person, place, and time.  Skin: Skin is warm and dry.  Psychiatric: He has a normal mood and affect. Judgment normal.    ED Course  Procedures (including critical care time)   Labs Reviewed  BASIC METABOLIC PANEL   No results found.   1. Hyponatremia       MDM  Reviewed the patient's  records demonstrate that the patient has always been hyponatremic and hypochloremic for the past 4 years.  This is unchanged from his normal labs.  Patient is without complaints at this time.  He has no cramping at this time.  The patient was hydrated with 2 L of IV fluids and he'll be sent back to the behavior health center to complete his detox.  I recommended if the inpatient psychiatrist continues to have concern regarding his electrolytes that they obtain an inpatient internal medicine consultation with the triad hospitalist service        Lyanne Co, MD 12/23/11 [phone]

## 2011-12-23 NOTE — ED Notes (Addendum)
Report given via EMS. Pt from [facility] for ETOH detox c/o hand cramping that started yesterday. Blood work yesterday showed low sodium. Physician recommended pt get potassium and sodium. Initial VS BP 130/90 HR 80 RR 16 at 1617. Allergic to Sudafed, Avelox. Hx of tobacco abuse, carotid artery disease, ETOH dependence, peptic ulcer disease, dyslipidemia, CAD, pacemaker and defibillator.

## 2011-12-23 NOTE — Progress Notes (Signed)
Patient resting quietly with eyes closed. Respirations even and unlabored. No distress noted. Q 15 minute check continues to maintain safety   

## 2011-12-23 NOTE — ED Notes (Signed)
QIO:[id number]<WU> Expected date:<BR> Expected time:<BR> Means of arrival:<BR> Comments:<BR> [employer] coming over [facility] low na 127.

## 2011-12-23 NOTE — ED Notes (Signed)
Security called for transport back to [facility].

## 2011-12-23 NOTE — Progress Notes (Signed)
[facility] Adult Inpatient Family/Significant Other Collateral Contact  Collateral Contact:  Trey Gulbranson at [phone], Patient's son in law,  has been identified by the patient as the family member/significant other who can provide for collateral information. With written consent from the patient, the family member/significant other has been contacted without success on 12/23/2011 at 3:51 PM for son in law to Regulatory affairs officer.  Another attempt will be made on weekend if writer does no hear back from today.  Clide Dales 12/23/2011 3:51 PM

## 2011-12-23 NOTE — Progress Notes (Signed)
[facility] Group Notes:  (Counselor/Nursing/MHT/Case Management/Adjunct)  12/23/2011 3:37 PM  Type of Therapy:  Group Therapy at 1:15 to 2:30  Participation Level:  Active  Participation Quality:  Appropriate, Attentive and Sharing  Affect:  Appropriate and Flat  Cognitive:  Alert  Insight:  Limited  Engagement in Group:  Good  Engagement in Therapy:  Good  Modes of Intervention:  Clarification, Education, Socialization and Support  Summary of Progress/Problems: Group session included an educational portion on Post Acute Withdrawal Syndrome and processing portion on feelings about relapse and what, if anything, is the motive for recovery.  [name] was attentive to educational portion and shared that he know isolation will set him up for relapse; "I just know when I start to withdraw the alcohol will soon be there."  Patient shared about multiple aches and pains (sinus, neck) and was encouraged by others to ask for what he needed from medical staff. [name] shared about his plans to  visit family [place]" and enjoy the remainder of the summer.  He also related well to another's comments re her desire to be "comfortable and  sober."   [name].td 3:44 PM

## 2011-12-23 NOTE — Progress Notes (Signed)
[facility] Case Management Discharge Plan:  Will you be returning to the same living situation after discharge: Yes,  home At discharge, do you have transportation home?:Yes,  son Do you have the ability to pay for your medications:Yes,  insurance  Interagency Information:     Release of information consent forms completed and in the chart;  Patient's signature needed at discharge.  Patient to Follow up at:  Follow-up Information    Follow up with [facility]  on 12/29/2011. (10:15 with Arabella Merles)    Contact information:   [address]  [place]  [phone]         Patient denies SI/HI:   Yes,  yes    Safety Planning and Suicide Prevention discussed:  Yes,  yes  Barrier to discharge identified:No.  Summary and Recommendations:   Ida Rogue 12/23/2011, 11:57 AM

## 2011-12-23 NOTE — Progress Notes (Addendum)
D patient slept fair last nite d/t sinus congestion and headache, appetite is improving and going to Dr for meals, energy level is hyper for him today but states that is normal for him and ability to pay attention is good, he is depressed at 1/10 and hopeless at 1/10, his only reported WD symptom is agitation, denies Si or Hi, taking meds as ordered by MD, going to group although he does not feel like sitting it is better than going to bed at this time, his affect is flat and anxious. A q78min safety checks continue and support offered, given Librium for the anxiety/agitation. R patient remains safe on the unit 1600 was seeing MD c/o cramps in hand/fingers low Na, VS BP 166/79, pulse 65, resp 20 T98.0, called nonemergent 911 called, sent to ED w/paperwork, Charge Nurse called w/report.

## 2011-12-24 DIAGNOSIS — F411 Generalized anxiety disorder: Secondary | ICD-10-CM

## 2011-12-24 DIAGNOSIS — F1210 Cannabis abuse, uncomplicated: Secondary | ICD-10-CM

## 2011-12-24 DIAGNOSIS — F1020 Alcohol dependence, uncomplicated: Secondary | ICD-10-CM

## 2011-12-24 DIAGNOSIS — F10988 Alcohol use, unspecified with other alcohol-induced disorder: Principal | ICD-10-CM

## 2011-12-24 MED ORDER — AMLODIPINE BESYLATE 5 MG PO TABS: 5.0000 mg | ORAL_TABLET | Freq: Every day | ORAL | Status: DC

## 2011-12-24 MED ORDER — HYDROXYZINE HCL 25 MG PO TABS: 25.0000 mg | ORAL_TABLET | Freq: Three times a day (TID) | ORAL | Status: DC | PRN

## 2011-12-24 MED ORDER — LISINOPRIL 40 MG PO TABS: 40.0000 mg | ORAL_TABLET | Freq: Every day | ORAL | Status: DC

## 2011-12-24 MED ORDER — ALBUTEROL SULFATE HFA 108 (90 BASE) MCG/ACT IN AERS: 2.0000 | INHALATION_SPRAY | Freq: Four times a day (QID) | RESPIRATORY_TRACT | Status: DC | PRN

## 2011-12-24 MED ORDER — METOPROLOL TARTRATE 100 MG PO TABS: 100.0000 mg | ORAL_TABLET | Freq: Two times a day (BID) | ORAL | Status: DC

## 2011-12-24 MED ORDER — TRAZODONE 25 MG HALF TABLET: 25.0000 mg | ORAL_TABLET | Freq: Every day | ORAL | Status: DC

## 2011-12-24 MED ORDER — OMEPRAZOLE 20 MG PO CPDR: 20.0000 mg | DELAYED_RELEASE_CAPSULE | Freq: Two times a day (BID) | ORAL | Status: DC

## 2011-12-24 MED ORDER — ATORVASTATIN CALCIUM 20 MG PO TABS: 20.0000 mg | ORAL_TABLET | Freq: Every day | ORAL | Status: DC

## 2011-12-24 MED ORDER — HYDROXYZINE HCL 25 MG PO TABS: 25.0000 mg | ORAL_TABLET | Freq: Three times a day (TID) | ORAL | Status: AC | PRN

## 2011-12-24 MED ORDER — CETIRIZINE HCL 10 MG PO TABS: 10.0000 mg | ORAL_TABLET | Freq: Every day | ORAL | Status: DC

## 2011-12-24 NOTE — Progress Notes (Signed)
Patient ID: [name], male   DOB: 13-Feb-1941, 71 y.o.   MRN: [id number]   [facility] Group Notes:  (Counselor/Nursing/MHT/Case Management/Adjunct)  12/24/2011 1:15 PM  Type of Therapy:  Group Therapy, Dance/Movement Therapy   Participation Level:  Did Not Attend Pt. was discharged.      Cassidi Long

## 2011-12-24 NOTE — Progress Notes (Signed)
Psychoeducational Group Note  Date:  12/24/2011 Time:  2000  Group Topic/Focus:  AA Group  Participation Level:  Active  Participation Quality:  Appropriate  Affect:  Appropriate  Cognitive:  Appropriate  Insight:  Good  Engagement in Group:  Good  Additional Comments: Pt returned from the ED halfway through group, but still attended and participated.  Kaleen Odea R 12/24/2011, 12:13 AM

## 2011-12-24 NOTE — Progress Notes (Signed)
Psychoeducational Group Note  Date:  12/24/2011 Time:  1015  Group Topic/Focus:  Identifying Needs:   The focus of this group is to help patients identify their personal needs that have been historically problematic and identify healthy behaviors to address their needs.  Participation Level:  Active  Participation Quality:  Appropriate  Affect:  Appropriate  Cognitive:  Alert  Insight:  Limited  Engagement in Group:  Good  Additional Comments:  Pt was attentive but did not given input. Dione Housekeeper

## 2011-12-24 NOTE — [facility] Suicide Risk Assessment (Signed)
Suicide Risk Assessment  Discharge Assessment     Demographic factors:  Male;Age 69 or older;Caucasian;Divorced or widowed;Living alone;Unemployed;Access to firearms Living alone;Unemployed  Current Mental Status Per Nursing Assessment::   On Admission:    At Discharge:   (denies si/hi/av)  Current Mental Status Per Physician: Patient denies suicidal or homicidal ideation, hallucinations, illusions, or delusions. Patient engages with good eye contact, is able to focus adequately in a one to one setting, and has clear goal directed thoughts. Patient speaks with a natural conversational volume, rate, and tone. Anxiety was reported at 4 or 5 on a scale of 1 the least and 10 the most. Depression was reported at 1 on the same scale. Patient is oriented times 4, recent and remote memory intact. Judgement: fair Insight: limited Plan: go to [employer] Remain on    . amLODipine  5 mg Oral Daily  . atorvastatin  20 mg Oral Daily  . azithromycin  250 mg Oral Daily  . lisinopril  40 mg Oral Daily  . loratadine  10 mg Oral Daily  . pantoprazole  40 mg Oral Q1200  . sodium chloride  1,000 mL Intravenous Once  . traZODone  25 mg Oral QHS  . DISCONTD: sodium chloride  1,000 mL Intravenous Once   Because they work And start Vistaril for anxiety. Pt voices understanding of the risks and benefits of medication. Follow-up is with Follow-up Information    Follow up with [facility]  on 12/29/2011. (10:15 with Arabella Merles)    Contact information:   [address]  [place]  [phone]        Loss Factors:    Historical Factors: Family history of mental illness or substance abuse  Risk Reduction Factors:   Sense of responsibility to family;Religious beliefs about death;Positive social support  Continued Clinical Symptoms:  Severe Anxiety and/or Agitation Alcohol/Substance Abuse/Dependencies Previous Psychiatric Diagnoses and Treatments  Discharge  Diagnoses:   AXIS I:  Alcohol Abuse, Generalized Anxiety Disorder and Substance Induced Mood Disorder AXIS II:  Deferred AXIS III:   Past Medical History  Diagnosis Date  . Nonischemic cardiomyopathy     EF was 20% (60% last echo 2008)  . Tobacco abuse   . Carotid artery disease   . EtOH dependence   . Peptic ulcer disease   . Dyslipidemia   . CAD (coronary artery disease)    AXIS IV:  other psychosocial or environmental problems AXIS V:  61-70 mild symptoms  Cognitive Features That Contribute To Risk:  Closed-mindedness Thought constriction (tunnel vision)    Suicide Risk:  Minimal: No identifiable suicidal ideation.  Patients presenting with no risk factors but with morbid ruminations; may be classified as minimal risk based on the severity of the depressive symptoms  Plan Of Care/Follow-up recommendations:  Activities: Resume typical activities Diet: Resume typical diet Other: Follow up with outpatient provider and report any side effects to out patient prescriber.   [name] 12/24/2011, 1:22 PM

## 2011-12-24 NOTE — Progress Notes (Signed)
D - Pt returned from ED for evaluation of hyponatremia and cramping in hands. Pt upset states "I was hoping to go home tomorrow now I wont be able to." Pt anxious, interacting with other patients appropriately, denies SI or HI. No complaints of cramping in hands or any discomfort.  A - Pt received two liters of IV fluids in the ED for dehydration, per report pt has chronic hyponatremia and no further treatment is needed. Reassured pt that the visit to the ED would have minimal impact on any discharge plans for tomorrow. Pt is ready to discharge and looks forward to going home.  R -  Maintain safety in the department, continue Q15 rounds, continue group and therapy sessions.

## 2011-12-24 NOTE — Progress Notes (Signed)
Patient ID: [name], male   DOB: 09/26/1940, 71 y.o.   MRN: [id number]  Pt. attended and participated in aftercare planning group. Pt. accepted brochure regarding information on suicide prevention, warning signs to look for with suicide and crisis line numbers to use.  Pt. listed their current anxiety level as 0 and their current depression level as 0. Pt. is being discharged today. Pt was cleared for D/C and denies any and all S/I and H/I.

## 2011-12-27 ENCOUNTER — Ambulatory Visit (AMBULATORY_SURGERY_CENTER): Payer: Medicare Other

## 2011-12-27 ENCOUNTER — Encounter: Payer: Self-pay | Admitting: Internal Medicine

## 2011-12-27 VITALS — Ht 68.0 in | Wt 138.9 lb

## 2011-12-27 DIAGNOSIS — Z1211 Encounter for screening for malignant neoplasm of colon: Secondary | ICD-10-CM

## 2011-12-27 DIAGNOSIS — K5730 Diverticulosis of large intestine without perforation or abscess without bleeding: Secondary | ICD-10-CM

## 2011-12-27 DIAGNOSIS — Z8371 Family history of colonic polyps: Secondary | ICD-10-CM

## 2011-12-27 MED ORDER — MOVIPREP 100 G PO SOLR: ORAL | Status: DC

## 2011-12-27 NOTE — Progress Notes (Signed)
Patient Discharge Instructions: Patient refused consent for follow up.  [name], 12/27/2011, 6:27 PM

## 2011-12-30 ENCOUNTER — Telehealth: Payer: Self-pay | Admitting: Cardiology

## 2011-12-30 ENCOUNTER — Telehealth: Payer: Self-pay | Admitting: *Deleted

## 2011-12-30 NOTE — Telephone Encounter (Signed)
No answer, msg left to continue to er.

## 2011-12-30 NOTE — Telephone Encounter (Signed)
Pt was taken off simvastatin and clonazepam  and given methodone at [facility] for alcohol abuse, that he was having a stroke having weakness in arm, called ems , what to do pls advise [phone]

## 2011-12-30 NOTE — Telephone Encounter (Signed)
ems said his BP was good, she may be having anxiety attack, pt was taken off clonazepam, informed him he has to call pcp or who ever prescribed. Pt agreed to plan.

## 2012-01-10 ENCOUNTER — Encounter: Payer: Self-pay | Admitting: Internal Medicine

## 2012-01-10 ENCOUNTER — Ambulatory Visit (AMBULATORY_SURGERY_CENTER): Payer: Medicare Other | Admitting: Internal Medicine

## 2012-01-10 VITALS — BP 118/73 | HR 68 | Temp 96.7°F | Resp 14 | Ht 68.0 in | Wt 138.0 lb

## 2012-01-10 DIAGNOSIS — K5730 Diverticulosis of large intestine without perforation or abscess without bleeding: Secondary | ICD-10-CM

## 2012-01-10 DIAGNOSIS — K648 Other hemorrhoids: Secondary | ICD-10-CM

## 2012-01-10 DIAGNOSIS — Z1211 Encounter for screening for malignant neoplasm of colon: Secondary | ICD-10-CM

## 2012-01-10 DIAGNOSIS — D126 Benign neoplasm of colon, unspecified: Secondary | ICD-10-CM

## 2012-01-10 MED ORDER — SODIUM CHLORIDE 0.9 % IV SOLN: 500.0000 mL | INTRAVENOUS | Status: DC

## 2012-01-10 NOTE — Op Note (Signed)
[facility] [address] [place], [place]  [address]  COLONOSCOPY PROCEDURE REPORT  PATIENT:  [name], [name]  MR#:  [id number] BIRTHDATE:  1940/06/29, 70 yrs. old  GENDER:  male ENDOSCOPIST:  Iva Boop, MD, [facility] REF. BY:  Helene Kelp, PA PROCEDURE DATE:  01/10/2012 PROCEDURE:  Colonoscopy with biopsy ASA CLASS:  Class III INDICATIONS:  Routine Risk Screening MEDICATIONS:   These medications were titrated to patient response per physician's verbal order, MAC sedation, administered by CRNA, propofol (Diprivan) 120 mg IV  DESCRIPTION OF PROCEDURE:   After the risks benefits and alternatives of the procedure were thoroughly explained, informed consent was obtained.  Digital rectal exam was performed and revealed no abnormalities and normal prostate.   The LB CF-[id number] endoscope was introduced through the anus and advanced to the cecum, which was identified by both the appendix and ileocecal valve, without limitations.  The quality of the prep was excellent, using MoviPrep.  The instrument was then slowly withdrawn as the colon was fully examined. <<PROCEDUREIMAGES>>  FINDINGS:  A diminutive polyp was found in the sigmoid colon. It was 2 mm in size. The polyp was removed using cold biopsy forceps. Severe diverticulosis was found in the left colon.  This was otherwise a normal examination of the colon. Includes right colon retroflexion.   Retroflexed views in the rectum revealed internal hemorrhoids.    The time to cecum = 1:49 minutes. The scope was then withdrawn in 7:50 minutes from the cecum and the procedure completed. COMPLICATIONS:  None ENDOSCOPIC IMPRESSION: 1) 2 mm diminutive polyp in the sigmoid colon - removed 2) Severe diverticulosis in the left colon 3) Internal hemorrhoids 4) Otherwise normal examination  REPEAT EXAM:  In for Colonoscopy, pending biopsy results.  Iva Boop, MD, [facility]  CC:  Helene Kelp, PA and The  Patient  n. eSIGNED:   Iva Boop at 01/10/2012 02:04 PM  Marcelle Smiling, [phone]

## 2012-01-10 NOTE — Progress Notes (Signed)
Patient did not experience any of the following events: a burn prior to discharge; a fall within the facility; wrong site/side/patient/procedure/implant event; or a hospital transfer or hospital admission upon discharge from the facility. (G8907) Patient did not have preoperative order for IV antibiotic SSI prophylaxis. (G8918)  

## 2012-01-10 NOTE — Progress Notes (Signed)
The pt tolerated the colonoscopy very well. Maw   

## 2012-01-10 NOTE — Patient Instructions (Addendum)
One tiny polyp was removed. You also have diverticulosis and hemorrhoids (internal).  Please read the handouts we have given you.  Thank you for choosing me and [facility].  Iva Boop, MD, [facility]  Discharge instructions given with verbal understanding. Handouts on polyps,diverticulosis, and hemorrhoids given. Resume previous medications. YOU HAD AN ENDOSCOPIC PROCEDURE TODAY AT THE [facility]: Refer to the procedure report that was given to you for any specific questions about what was found during the examination.  If the procedure report does not answer your questions, please call your gastroenterologist to clarify.  If you requested that your care partner not be given the details of your procedure findings, then the procedure report has been included in a sealed envelope for you to review at your convenience later.  YOU SHOULD EXPECT: Some feelings of bloating in the abdomen. Passage of more gas than usual.  Walking can help get rid of the air that was put into your GI tract during the procedure and reduce the bloating. If you had a lower endoscopy (such as a colonoscopy or flexible sigmoidoscopy) you may notice spotting of blood in your stool or on the toilet paper. If you underwent a bowel prep for your procedure, then you may not have a normal bowel movement for a few days.  DIET: Your first meal following the procedure should be a light meal and then it is ok to progress to your normal diet.  A half-sandwich or bowl of soup is an example of a good first meal.  Heavy or fried foods are harder to digest and may make you feel nauseous or bloated.  Likewise meals heavy in dairy and vegetables can cause extra gas to form and this can also increase the bloating.  Drink plenty of fluids but you should avoid alcoholic beverages for 24 hours.  ACTIVITY: Your care partner should take you home directly after the procedure.  You should plan to take it easy, moving slowly  for the rest of the day.  You can resume normal activity the day after the procedure however you should NOT DRIVE or use heavy machinery for 24 hours (because of the sedation medicines used during the test).    SYMPTOMS TO REPORT IMMEDIATELY: A gastroenterologist can be reached at any hour.  During normal business hours, 8:30 AM to 5:00 PM Monday through Friday, call [phone].  After hours and on weekends, please call the GI answering service at [phone] who will take a message and have the physician on call contact you.   Following lower endoscopy (colonoscopy or flexible sigmoidoscopy):  Excessive amounts of blood in the stool  Significant tenderness or worsening of abdominal pains  Swelling of the abdomen that is new, acute  Fever of 100F or higher  FOLLOW UP: If any biopsies were taken you will be contacted by phone or by letter within the next 1-3 weeks.  Call your gastroenterologist if you have not heard about the biopsies in 3 weeks.  Our staff will call the home number listed on your records the next business day following your procedure to check on you and address any questions or concerns that you may have at that time regarding the information given to you following your procedure. This is a courtesy call and so if there is no answer at the home number and we have not heard from you through the emergency physician on call, we will assume that you have returned to your regular daily activities  without incident.  SIGNATURES/CONFIDENTIALITY: You and/or your care partner have signed paperwork which will be entered into your electronic medical record.  These signatures attest to the fact that that the information above on your After Visit Summary has been reviewed and is understood.  Full responsibility of the confidentiality of this discharge information lies with you and/or your care-partner.

## 2012-01-11 ENCOUNTER — Telehealth: Payer: Self-pay | Admitting: *Deleted

## 2012-01-11 NOTE — Telephone Encounter (Signed)
  Follow up Call-  Call back number 01/10/2012  Post procedure Call Back phone  # [phone]  Permission to leave phone message Yes     Patient questions:  Do you have a fever, pain , or abdominal swelling? no Pain Score  0 *  Have you tolerated food without any problems? yes  Have you been able to return to your normal activities? yes  Do you have any questions about your discharge instructions: Diet   no Medications  no Follow up visit  no  Do you have questions or concerns about your Care? no  Actions: * If pain score is 4 or above: No action needed, pain <4.

## 2012-01-18 ENCOUNTER — Encounter: Payer: Self-pay | Admitting: Internal Medicine

## 2012-01-18 DIAGNOSIS — Z860100 Personal history of colon polyps, unspecified: Secondary | ICD-10-CM | POA: Insufficient documentation

## 2012-01-18 NOTE — Progress Notes (Signed)
Quick Note:  Diminutive adenoma Recall 2020 ______

## 2012-01-19 ENCOUNTER — Encounter: Payer: Medicare Other | Admitting: *Deleted

## 2012-01-25 ENCOUNTER — Encounter: Payer: Self-pay | Admitting: *Deleted

## 2012-02-01 NOTE — Discharge Summary (Signed)
Physician Discharge Summary Note  Patient:  [name] is an 71 y.o., male MRN:  [id number] DOB:  1941-04-06 Patient phone:  [phone] (home)  Patient address:   [address] [place] [address],   Date of Admission:  12/20/2011  Reason for Admission: see H&P. Pt admitted for crisis stabilization and treatment. No detox taper indicated, pt has not used in 4 days. Initiated low dose trazodone for sleep. Discussed potential residential Tx s/p acute stabilization.   Discharge Diagnoses: Alcohol Dependence; Cannabis Abuse; HTN; HL; Hyponatremia (pt states that he chronically runs between 127-130); Transaminitis; GERD; Seasonal Allergies; Insomnia; Alopecia   Past Medical History  Diagnosis Date  . Nonischemic cardiomyopathy     EF was 20% (60% last echo 2008)  . Tobacco abuse   . Carotid artery disease   . EtOH dependence   . Peptic ulcer disease   . Dyslipidemia   . CAD (coronary artery disease)   . Hypertension   . Hyperlipidemia   . Insomnia   . Depression   . Pacemaker     and defibrillator  . Arthritis   . CHF (congestive heart failure)   . COPD (chronic obstructive pulmonary disease)    Level of Care:  OP  Hospital Course:  Pt admitted for crisis stabilization and treatment. Pt attended all therapeutic groups, was active in his treatment planning process and agreed to current medication regimen for further stability during recovery.  There were no acute issues during treatment. All labs were reviewed in great detail and medical needs were addressed.  No acute safety issues were noted on the unit. Medications were reviewed with pt and medication education was provided. Mental health treatment, medication management and continued sobriety will mitigate against any increased risk of harm to self and/or others.  Discussed the importance of recovery with pt, as well as, tools to move forward in a healthy & safe manner using the 12 Step Process.    Consults:   IM  Significant Diagnostic Studies:  See labs.  Discharge Vitals:   Blood pressure 109/74, pulse 116, temperature 97.4 F (36.3 C), temperature source Oral, resp. rate 16, height 5\' 8"  (1.727 m), weight 62.143 kg (137 lb), SpO2 100.00%.  Mental Status Exam: See Mental Status Examination and Suicide Risk Assessment completed by Attending Physician prior to discharge.  Completed and pt discharged by Dr. Dan Humphreys.  Discharge destination:  Home.  Is patient on multiple antipsychotic therapies at discharge:  No   Has Patient had three or more failed trials of antipsychotic monotherapy by history:  No  Recommended Plan for Multiple Antipsychotic Therapies: NA  Medication List  As of 02/01/2012  9:13 AM   STOP taking these medications         atorvastatin 20 MG tablet      clonazePAM 1 MG tablet      simvastatin 40 MG tablet         TAKE these medications      Indication    albuterol 108 (90 BASE) MCG/ACT inhaler   Commonly known as: PROVENTIL HFA;VENTOLIN HFA   Inhale 2 puffs into the lungs every 6 (six) hours as needed. For shortness of breath.       amLODipine 5 MG tablet   Commonly known as: NORVASC   Take 1 tablet (5 mg total) by mouth daily. For high blood pressure       cetirizine 10 MG tablet   Commonly known as: ZYRTEC   Take 1 tablet (10 mg  total) by mouth daily. For allergies       lisinopril 40 MG tablet   Commonly known as: PRINIVIL,ZESTRIL   Take 1 tablet (40 mg total) by mouth daily. For high blood pressure control       metoprolol 100 MG tablet   Commonly known as: LOPRESSOR   Take 1 tablet (100 mg total) by mouth 2 (two) times daily. For high blood pressure control       omeprazole 20 MG capsule   Commonly known as: PRILOSEC   Take 1 capsule (20 mg total) by mouth 2 (two) times daily. For acid reflux       traZODone 25 mg Tabs   Commonly known as: DESYREL   Take 0.5 tablets (25 mg total) by mouth at bedtime. For sleep            Follow-up  Information    Follow up with [facility]  on 12/29/2011. (10:15 with Arabella Merles)    Contact information:   [address]  [place]  [phone]         Follow-up recommendations:  Mental health treatment, medication management and continued sobriety will mitigate against the potential increased risk of harm to self and/or others.  Discussed the importance of recovery further with pt, as well as, tools to move forward in a healthy & safe manner.  Pt agreeable with the plan.  Discussed with the team.  Recommend follow up with AA/NA.  Diet: Heart Healthy.  Activity: As tolerated.     Signed: Lupe Carney 02/01/2012, 9:13 AM

## 2012-06-28 ENCOUNTER — Encounter: Payer: Self-pay | Admitting: *Deleted

## 2012-09-13 ENCOUNTER — Other Ambulatory Visit: Payer: Self-pay | Admitting: Physician Assistant

## 2012-10-15 ENCOUNTER — Encounter: Payer: Self-pay | Admitting: Internal Medicine

## 2012-10-15 ENCOUNTER — Ambulatory Visit (INDEPENDENT_AMBULATORY_CARE_PROVIDER_SITE_OTHER): Payer: Medicare Other | Admitting: Internal Medicine

## 2012-10-15 VITALS — BP 137/81 | HR 59 | Wt 140.0 lb

## 2012-10-15 DIAGNOSIS — I428 Other cardiomyopathies: Secondary | ICD-10-CM

## 2012-10-15 DIAGNOSIS — I472 Ventricular tachycardia, unspecified: Secondary | ICD-10-CM

## 2012-10-15 DIAGNOSIS — I5022 Chronic systolic (congestive) heart failure: Secondary | ICD-10-CM

## 2012-10-15 LAB — ICD DEVICE OBSERVATION
AL AMPLITUDE: 3.6 mv
AL IMPEDENCE ICD: 699 Ohm
BAMS-0003: 70 {beats}/min
BATTERY VOLTAGE: 2.83 V
DEVICE MODEL ICD: 561795
LV LEAD AMPLITUDE: 20.9 mv
LV LEAD IMPEDENCE ICD: 774 Ohm
RV LEAD AMPLITUDE: 25 mv
RV LEAD THRESHOLD: 0.6 V
TZAT-0005FASTVT: 81 pct
TZAT-0012FASTVT: 200 ms
TZAT-0012FASTVT: 200 ms
TZAT-0013FASTVT: 2
TZAT-0013FASTVT: 2
TZAT-0018FASTVT: NEGATIVE
TZAT-0018FASTVT: NEGATIVE
TZAT-0019FASTVT: 7.5 V
TZAT-0019FASTVT: 7.5 V
TZAT-0020FASTVT: 1 ms
TZAT-0020FASTVT: 1 ms
TZON-0004FASTVT: 2.5
TZON-0005FASTVT: 1
TZON-0008FASTVT: 3:0 {titer}
TZST-0001FASTVT: 5
TZST-0003FASTVT: 31 J
TZST-0003FASTVT: 31 J
VENTRICULAR PACING ICD: 96 pct

## 2012-10-15 NOTE — Progress Notes (Signed)
PCP:  Horald Pollen., PA-C Primary Cardiologist:  Dr Antoine Poche  The patient presents today for routine electrophysiology followup.  Since last being seen in our clinic, the patient reports doing very well.  He remains active.  Today, he denies symptoms of palpitations, chest pain, shortness of breath, orthopnea, PND, lower extremity edema, dizziness, presyncope, syncope, or neurologic sequela.  Unfortunately, he continues to smoke.  He is not ready to quit.  The patient feels that he is tolerating medications without difficulties and is otherwise without complaint today.   Past Medical History  Diagnosis Date  . Nonischemic cardiomyopathy     EF was 20% (60% last echo 2008)  . Tobacco abuse   . Carotid artery disease   . EtOH dependence   . Peptic ulcer disease   . Dyslipidemia   . CAD (coronary artery disease)   . Hypertension   . Hyperlipidemia   . Insomnia   . Depression   . Pacemaker     and defibrillator  . Arthritis   . CHF (congestive heart failure)   . COPD (chronic obstructive pulmonary disease)    Past Surgical History  Procedure Laterality Date  . Ulcer surgery    . Biventricular defibrillator implantation      [employer]  . Coronary stent placement      Current Outpatient Prescriptions  Medication Sig Dispense Refill  . amLODipine (NORVASC) 5 MG tablet Take 1 tablet (5 mg total) by mouth daily. For high blood pressure      . aspirin 81 MG tablet Take 81 mg by mouth daily.      . cetirizine (ZYRTEC) 10 MG tablet Take 1 tablet (10 mg total) by mouth daily. For allergies      . hydrOXYzine (ATARAX/VISTARIL) 25 MG tablet       . lisinopril (PRINIVIL,ZESTRIL) 40 MG tablet Take 1 tablet (40 mg total) by mouth daily. For high blood pressure control      . metoprolol (LOPRESSOR) 100 MG tablet Take 1 tablet (100 mg total) by mouth 2 (two) times daily. For high blood pressure control      . omeprazole (PRILOSEC) 20 MG capsule Take 1 capsule (20 mg total) by mouth 2  (two) times daily. For acid reflux      . pravastatin (PRAVACHOL) 40 MG tablet TAKE ONE TABLET BY MOUTH AT BEDTIME  30 tablet  2  . Tamsulosin HCl (FLOMAX) 0.4 MG CAPS        No current facility-administered medications for this visit.    Allergies  Allergen Reactions  . Sudafed (Pseudoephedrine Hcl)   . Avelox (Moxifloxacin Hcl In Nacl)     History   Social History  . Marital Status: Divorced    Spouse Name: N/A    Number of Children: N/A  . Years of Education: N/A   Occupational History  . Not on file.   Social History Main Topics  . Smoking status: Current Every Day Smoker -- 0.25 packs/day for 45 years    Types: Cigarettes  . Smokeless tobacco: Former Neurosurgeon    Types: Chew    Quit date: 06/20/1989     Comment: he is not interested in cessation  . Alcohol Use: No     Comment: drings 4-6 beers per day  . Drug Use: No     Comment: stopped using marijuana  . Sexually Active: Not on file   Other Topics Concern  . Not on file   Social History Narrative  . No narrative on  file    Physical Exam: Filed Vitals:   10/15/12 1503  BP: 137/81  Pulse: 59  Weight: 140 lb (63.504 kg)    GEN- The patient is well appearing, alert and oriented x 3 today.   Head- normocephalic, atraumatic Eyes-  Sclera clear, conjunctiva pink Ears- hearing intact Oropharynx- clear Neck- supple, no JVP Lymph- no cervical lymphadenopathy Lungs- Clear to ausculation bilaterally, normal work of breathing Chest- ICD pocket is well healed Heart- Regular rate and rhythm, no murmurs, rubs or gallops, PMI not laterally displaced GI- soft, NT, ND, + BS Extremities- no clubbing, cyanosis, or edema  ICD interrogation- reviewed in detail today,  See PACEART report  Assessment and Plan:

## 2012-10-15 NOTE — Patient Instructions (Addendum)
   3 months - device  1 year - Allred Continue all current medications.

## 2012-10-15 NOTE — Assessment & Plan Note (Signed)
Stable Normal BiV ICD function See Pace Art report No changes today

## 2012-10-17 ENCOUNTER — Encounter (INDEPENDENT_AMBULATORY_CARE_PROVIDER_SITE_OTHER): Payer: Medicare Other

## 2012-10-17 ENCOUNTER — Ambulatory Visit (INDEPENDENT_AMBULATORY_CARE_PROVIDER_SITE_OTHER): Payer: Medicare Other | Admitting: Cardiology

## 2012-10-17 ENCOUNTER — Encounter: Payer: Self-pay | Admitting: Cardiology

## 2012-10-17 VITALS — BP 131/74 | HR 51 | Ht 68.0 in | Wt 141.0 lb

## 2012-10-17 DIAGNOSIS — I2510 Atherosclerotic heart disease of native coronary artery without angina pectoris: Secondary | ICD-10-CM

## 2012-10-17 DIAGNOSIS — I6529 Occlusion and stenosis of unspecified carotid artery: Secondary | ICD-10-CM

## 2012-10-17 NOTE — Progress Notes (Signed)
HPI The patient presented for one year followup. Since I last saw him he has had no cardiovascular complaints.  The patient denies any new symptoms such as chest discomfort, neck or arm discomfort. There has been no new shortness of breath, PND or orthopnea. There have been no reported palpitations, presyncope or syncope.  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 He unfortunately continues to drink but he reports not as much and he smoke cigarettes.  Allergies  Allergen Reactions  . Sudafed (Pseudoephedrine Hcl)   . Avelox (Moxifloxacin Hcl In Nacl)     Current Outpatient Prescriptions  Medication Sig Dispense Refill  . amLODipine (NORVASC) 5 MG tablet Take 1 tablet (5 mg total) by mouth daily. For high blood pressure      . aspirin 81 MG tablet Take 81 mg by mouth daily.      . cetirizine (ZYRTEC) 10 MG tablet Take 1 tablet (10 mg total) by mouth daily. For allergies      . hydrOXYzine (ATARAX/VISTARIL) 25 MG tablet       . lisinopril (PRINIVIL,ZESTRIL) 40 MG tablet Take 1 tablet (40 mg total) by mouth daily. For high blood pressure control      . metoprolol (LOPRESSOR) 100 MG tablet Take 1 tablet (100 mg total) by mouth 2 (two) times daily. For high blood pressure control      . omeprazole (PRILOSEC) 20 MG capsule Take 1 capsule (20 mg total) by mouth 2 (two) times daily. For acid reflux      . pravastatin (PRAVACHOL) 40 MG tablet TAKE ONE TABLET BY MOUTH AT BEDTIME  30 tablet  2  . Tamsulosin HCl (FLOMAX) 0.4 MG CAPS        No current facility-administered medications for this visit.    Past Medical History  Diagnosis Date  . Nonischemic cardiomyopathy     EF was 20% (60% last echo 2008)  . Tobacco abuse   . Carotid artery disease   . EtOH dependence   . Peptic ulcer disease   . Dyslipidemia   . CAD (coronary artery disease)   . Hypertension   . Hyperlipidemia   . Insomnia   . Depression   . Pacemaker     and defibrillator  .  Arthritis   . CHF (congestive heart failure)   . COPD (chronic obstructive pulmonary disease)     Past Surgical History  Procedure Laterality Date  . Ulcer surgery    . Biventricular defibrillator implantation      [employer]  . Coronary stent placement      ROS: As stated in the HPI and negative for all other systems.  PHYSICAL EXAM BP 131/74  Pulse 51  Ht 5\' 8"  (1.727 m)  Wt 141 lb (63.957 kg)  BMI 21.44 kg/m2 GENERAL:  Well appearing NECK:  No jugular venous distention, waveform within normal limits, carotid upstroke brisk and symmetric, no bruits, no thyromegaly LYMPHATICS:  No cervical, inguinal adenopathy LUNGS:  Clear to auscultation bilaterally BACK:  No CVA tenderness,  CHEST:  ICD pocket HEART:  PMI not displaced or sustained,S1 and S2 within normal limits, no S3, no S4, no clicks, no rubs, no murmurs ABD:  Flat, positive bowel sounds normal in frequency in pitch, no bruits, no rebound, no guarding, no midline pulsatile mass, no hepatomegaly, no splenomegaly EXT:  2 plus pulses throughout, no edema, no cyanosis no clubbing SKIN:  No rashes no nodules   EKG:  Sinus bradycardia.  with ventricular pacing, rate 51. 10/17/2012   ASSESSMENT AND PLAN  VENTRICULAR TACHYCARDIA:  He is up to date with ICD follow up.  CARDIOMOPATHY:  His EF was improved still at the last echo.  I would no reason to suspect that it is lower. He will continue the medicines as listed.  CAROTID STENOSIS:  He is due to have followup of this today.  TOBACCO ABUSE:   He still cannot quit smokingthe we have talked about this repeatedly.  DYSLIPIDEMIA:  This is followed closely by his primary provider.  He is near target with his last LDL being 109 which is OK for primary prevention.

## 2012-10-17 NOTE — Patient Instructions (Addendum)
The current medical regimen is effective;  continue present plan and medications.  Follow up in 1 year with Dr Hochrein.  You will receive a letter in the mail 2 months before you are due.  Please call us when you receive this letter to schedule your follow up appointment.  

## 2012-11-07 ENCOUNTER — Other Ambulatory Visit: Payer: Self-pay | Admitting: *Deleted

## 2012-11-07 MED ORDER — LISINOPRIL 40 MG PO TABS: 40.0000 mg | ORAL_TABLET | Freq: Every day | ORAL | Status: DC

## 2012-12-05 ENCOUNTER — Other Ambulatory Visit: Payer: Self-pay | Admitting: *Deleted

## 2012-12-05 MED ORDER — OMEPRAZOLE 20 MG PO CPDR: 20.0000 mg | DELAYED_RELEASE_CAPSULE | Freq: Two times a day (BID) | ORAL | Status: DC

## 2012-12-14 ENCOUNTER — Other Ambulatory Visit: Payer: Self-pay | Admitting: *Deleted

## 2012-12-14 MED ORDER — PRAVASTATIN SODIUM 40 MG PO TABS: ORAL_TABLET | ORAL | Status: DC

## 2012-12-27 ENCOUNTER — Encounter: Payer: Self-pay | Admitting: Family Medicine

## 2012-12-27 ENCOUNTER — Ambulatory Visit (INDEPENDENT_AMBULATORY_CARE_PROVIDER_SITE_OTHER): Payer: Medicare Other | Admitting: Family Medicine

## 2012-12-27 VITALS — BP 154/78 | HR 78 | Temp 97.4°F | Ht 67.0 in | Wt 138.0 lb

## 2012-12-27 DIAGNOSIS — I10 Essential (primary) hypertension: Secondary | ICD-10-CM

## 2012-12-27 DIAGNOSIS — K219 Gastro-esophageal reflux disease without esophagitis: Secondary | ICD-10-CM

## 2012-12-27 DIAGNOSIS — E785 Hyperlipidemia, unspecified: Secondary | ICD-10-CM

## 2012-12-27 DIAGNOSIS — F411 Generalized anxiety disorder: Secondary | ICD-10-CM

## 2012-12-27 MED ORDER — LISINOPRIL 40 MG PO TABS: 40.0000 mg | ORAL_TABLET | Freq: Every day | ORAL | Status: DC

## 2012-12-27 MED ORDER — METOPROLOL TARTRATE 100 MG PO TABS: 100.0000 mg | ORAL_TABLET | Freq: Two times a day (BID) | ORAL | Status: DC

## 2012-12-27 MED ORDER — PRAVASTATIN SODIUM 40 MG PO TABS: ORAL_TABLET | ORAL | Status: DC

## 2012-12-27 MED ORDER — AMLODIPINE BESYLATE 5 MG PO TABS: 5.0000 mg | ORAL_TABLET | Freq: Every day | ORAL | Status: DC

## 2012-12-27 MED ORDER — OMEPRAZOLE 20 MG PO CPDR: 20.0000 mg | DELAYED_RELEASE_CAPSULE | Freq: Two times a day (BID) | ORAL | Status: DC

## 2012-12-27 MED ORDER — ALPRAZOLAM 1 MG PO TABS: 1.0000 mg | ORAL_TABLET | ORAL | Status: DC | PRN

## 2012-12-27 NOTE — Progress Notes (Signed)
  Subjective:    Patient ID: [name], male    DOB: 01-Aug-1940, 72 y.o.   MRN: [id number]  HPI This 71 y.o. male presents for evaluation of hypertension, hyperlipidemia, anxiety and GERD.  Patient takes One half to one xanax bid for anxiety.  He states he lives alone and gets anxious on occasion.  He is going to [employer] for a few months  He denies any acute medical problems.   Review of Systems    No chest pain, SOB, HA, dizziness, vision change, N/V, diarrhea, constipation, dysuria, urinary urgency or frequency, myalgias, arthralgias or rash.  Objective:   Physical Exam Vital signs noted  Well developed well nourished male.  HEENT - Head atraumatic Normocephalic                Eyes - PERRLA, Conjuctiva - clear Sclera- Clear EOMI                Ears - EAC's Wnl TM's Wnl Gross Hearing WNL                Nose - Nares patent                 Throat - oropharanx wnl Respiratory - Lungs CTA bilateral Cardiac - RRR S1 and S2 without murmur GI - Abdomen soft Nontender and bowel sounds active x 4 Extremities - No edema. Neuro - Grossly intact.       Assessment & Plan:  Other and unspecified hyperlipidemia - Plan: pravastatin (PRAVACHOL) 40 MG tablet  Essential hypertension, benign - Plan: lisinopril (PRINIVIL,ZESTRIL) 40 MG tablet, metoprolol (LOPRESSOR) 100 MG tablet, amLODipine (NORVASC) 5 MG tablet  GERD (gastroesophageal reflux disease) - Plan: omeprazole (PRILOSEC) 20 MG capsule  Anxiety state, unspecified - Plan: ALPRAZolam (XANAX) 1 MG tablet If anxiety worsens follow up and consider rx of SSRI.  Follow up in 6 months and get labs next visit.

## 2012-12-27 NOTE — Patient Instructions (Addendum)

## 2013-01-07 ENCOUNTER — Ambulatory Visit (INDEPENDENT_AMBULATORY_CARE_PROVIDER_SITE_OTHER): Payer: Medicare Other | Admitting: *Deleted

## 2013-01-07 DIAGNOSIS — I472 Ventricular tachycardia: Secondary | ICD-10-CM

## 2013-01-07 DIAGNOSIS — I428 Other cardiomyopathies: Secondary | ICD-10-CM

## 2013-01-07 LAB — ICD DEVICE OBSERVATION
AL AMPLITUDE: 5.3 mv
AL IMPEDENCE ICD: 718 Ohm
AL THRESHOLD: 1 V
BATTERY VOLTAGE: 2.79 V
CHARGE TIME: 7.4 s
DEVICE MODEL ICD: 561795
LV LEAD AMPLITUDE: 19.5 mv
LV LEAD IMPEDENCE ICD: 823 Ohm
LV LEAD THRESHOLD: 1.6 V
RV LEAD AMPLITUDE: 24.3 mv
RV LEAD THRESHOLD: 0.6 V
TZAT-0001FASTVT: 1
TZAT-0005FASTVT: 81 pct
TZAT-0012FASTVT: 200 ms
TZAT-0018FASTVT: NEGATIVE
TZAT-0019FASTVT: 7.5 V
TZAT-0019FASTVT: 7.5 V
TZAT-0020FASTVT: 1 ms
TZAT-0020FASTVT: 1 ms
TZON-0004FASTVT: 2.5
TZON-0005FASTVT: 1
TZON-0008FASTVT: 3:0 {titer}
TZON-0010FASTVT: 10 ms
TZST-0001FASTVT: 3
TZST-0001FASTVT: 5
TZST-0001FASTVT: 7
TZST-0003FASTVT: 21 J
TZST-0003FASTVT: 31 J
TZST-0003FASTVT: 31 J
TZST-0003FASTVT: 31 J

## 2013-01-07 NOTE — Progress Notes (Signed)
CRT-D check in clinic. Normal device function. Battery voltage 2.74 V. No episodes recorded since last check. AP 0% and BP 98%. ROV in 3 mths w/device clinic in [place] office.

## 2013-01-10 ENCOUNTER — Other Ambulatory Visit: Payer: Self-pay

## 2013-01-10 ENCOUNTER — Encounter: Payer: Self-pay | Admitting: Family Medicine

## 2013-01-10 ENCOUNTER — Ambulatory Visit (INDEPENDENT_AMBULATORY_CARE_PROVIDER_SITE_OTHER): Payer: Medicare Other | Admitting: Family Medicine

## 2013-01-10 VITALS — BP 138/70 | HR 69 | Temp 97.4°F | Ht 67.0 in | Wt 139.0 lb

## 2013-01-10 DIAGNOSIS — J329 Chronic sinusitis, unspecified: Secondary | ICD-10-CM

## 2013-01-10 DIAGNOSIS — I10 Essential (primary) hypertension: Secondary | ICD-10-CM

## 2013-01-10 MED ORDER — METOPROLOL TARTRATE 100 MG PO TABS: 100.0000 mg | ORAL_TABLET | Freq: Two times a day (BID) | ORAL | Status: DC

## 2013-01-10 MED ORDER — METHYLPREDNISOLONE (PAK) 4 MG PO TABS: ORAL_TABLET | ORAL | Status: DC

## 2013-01-10 MED ORDER — AMOXICILLIN-POT CLAVULANATE 875-125 MG PO TABS: 1.0000 | ORAL_TABLET | Freq: Two times a day (BID) | ORAL | Status: DC

## 2013-01-10 MED ORDER — MOMETASONE FUROATE 50 MCG/ACT NA SUSP: 2.0000 | Freq: Every day | NASAL | Status: DC

## 2013-01-10 NOTE — Patient Instructions (Signed)

## 2013-01-10 NOTE — Progress Notes (Signed)
  Subjective:    Patient ID: [name], male    DOB: 06/15/1941, 72 y.o.   MRN: [id number]  HPI This 72 y.o. male presents for evaluation of sinus infection.  He has  Been having some sinus congestion and some maxillary facial discomfort. He has been feeling washed out tired. He c/o dull headache.   Review of Systems C/o sinus congestion and dull headache. No chest pain, SOB, dizziness, vision change, N/V, diarrhea, constipation, dysuria, urinary urgency or frequency, myalgias, arthralgias or rash.     Objective:   Physical Exam  Vital signs noted  Well developed well nourished male.  HEENT - Head atraumatic Normocephalic                Eyes - PERRLA, Conjuctiva - clear Sclera- Clear EOMI                Ears - EAC's Wnl TM's Wnl Gross Hearing WNL                Nose - Nares patent                 Throat - oropharanx wnl                Face - Tenderness maxillary sinuses. Respiratory - Lungs CTA bilateral Cardiac - RRR S1 and S2 without murmur      Assessment & Plan:  Sinusitis - Plan: methylPREDNIsolone (MEDROL DOSPACK) 4 MG tablet, amoxicillin-clavulanate (AUGMENTIN) 875-125 MG per tablet, mometasone (NASONEX) 50 MCG/ACT nasal spray

## 2013-01-28 ENCOUNTER — Encounter: Payer: Self-pay | Admitting: Internal Medicine

## 2013-01-29 ENCOUNTER — Telehealth: Payer: Self-pay | Admitting: Family Medicine

## 2013-02-01 ENCOUNTER — Other Ambulatory Visit: Payer: Self-pay | Admitting: Family Medicine

## 2013-02-01 DIAGNOSIS — F411 Generalized anxiety disorder: Secondary | ICD-10-CM

## 2013-02-01 NOTE — Telephone Encounter (Signed)
Tell him to take the xanax bid

## 2013-02-01 NOTE — Telephone Encounter (Signed)
Bill please address 

## 2013-02-05 NOTE — Telephone Encounter (Signed)
Patient is out needs it called in

## 2013-02-07 ENCOUNTER — Other Ambulatory Visit: Payer: Self-pay | Admitting: Family Medicine

## 2013-02-07 DIAGNOSIS — F411 Generalized anxiety disorder: Secondary | ICD-10-CM

## 2013-02-07 MED ORDER — ALPRAZOLAM 1 MG PO TABS: 1.0000 mg | ORAL_TABLET | Freq: Three times a day (TID) | ORAL | Status: DC | PRN

## 2013-02-07 NOTE — Telephone Encounter (Signed)
Needs his nerve meds called in he has been completely out and has been trying for over a week to get his xanax

## 2013-02-07 NOTE — Telephone Encounter (Signed)
Take bid then and will fix next time we send in a new rx for xanax

## 2013-02-07 NOTE — Telephone Encounter (Signed)
NO THE PATIENT HAS NONE LEFT HE IS COMPLETELY OUT PLEASE CALL SOME IN HE HAS BEEN CALLING SINCE LAST WEEK

## 2013-02-07 NOTE — Telephone Encounter (Signed)
Called in patient aware.  

## 2013-02-07 NOTE — Telephone Encounter (Signed)
I fixed the rx to read tid prn and just take what you are dispensed and then can get refill when ready

## 2013-02-08 ENCOUNTER — Other Ambulatory Visit: Payer: Self-pay | Admitting: Family Medicine

## 2013-02-08 DIAGNOSIS — F411 Generalized anxiety disorder: Secondary | ICD-10-CM

## 2013-02-08 NOTE — Telephone Encounter (Signed)
RX was was changed and need to p/u or fax

## 2013-03-11 ENCOUNTER — Telehealth: Payer: Self-pay | Admitting: Family Medicine

## 2013-03-11 NOTE — Telephone Encounter (Signed)
appt given for today 

## 2013-03-12 ENCOUNTER — Ambulatory Visit (INDEPENDENT_AMBULATORY_CARE_PROVIDER_SITE_OTHER): Payer: Medicare Other | Admitting: Family Medicine

## 2013-03-12 ENCOUNTER — Encounter: Payer: Self-pay | Admitting: Family Medicine

## 2013-03-12 VITALS — BP 163/86 | HR 75 | Temp 96.7°F | Wt 136.2 lb

## 2013-03-12 DIAGNOSIS — J209 Acute bronchitis, unspecified: Secondary | ICD-10-CM

## 2013-03-12 MED ORDER — AMOXICILLIN 875 MG PO TABS: 875.0000 mg | ORAL_TABLET | Freq: Two times a day (BID) | ORAL | Status: DC

## 2013-03-12 NOTE — Progress Notes (Signed)
  Subjective:    Patient ID: [name], male    DOB: Dec 25, 1940, 72 y.o.   MRN: [id number]  HPI This 72 y.o. male presents for evaluation of URI sx's.  He has been coughing And congestion.  He has been a life time smoker.   Review of Systems C/o cough and congestion   No chest pain, SOB, HA, dizziness, vision change, N/V, diarrhea, constipation, dysuria, urinary urgency or frequency, myalgias, arthralgias or rash.  Objective:   Physical Exam Vital signs noted  Well developed well nourished male.  HEENT - Head atraumatic Normocephalic                Eyes - PERRLA, Conjuctiva - clear Sclera- Clear EOMI                Ears - EAC's Wnl TM's Wnl Gross Hearing WNL                Nose - Nares patent                 Throat - oropharanx wnl Respiratory - Lungs coarse bilateral Cardiac - RRR S1 and S2 without murmur GI - Abdomen soft Nontender and bowel sounds active x 4 Extremities - No edema. Neuro - Grossly intact.       Assessment & Plan:  Acute bronchitis - Plan: amoxicillin (AMOXIL) 875 MG tablet po bid x 10 days continue mucinex Otc, symbicort 160/4.5 2 puffs bid #1 sample.  Push po fluids, rest, and follow up prn. Discussed with patient that he needs to quit smoking.

## 2013-03-12 NOTE — Patient Instructions (Signed)

## 2013-03-21 ENCOUNTER — Other Ambulatory Visit: Payer: Self-pay | Admitting: *Deleted

## 2013-03-21 DIAGNOSIS — I10 Essential (primary) hypertension: Secondary | ICD-10-CM

## 2013-03-21 MED ORDER — AMLODIPINE BESYLATE 5 MG PO TABS: 5.0000 mg | ORAL_TABLET | Freq: Every day | ORAL | Status: DC

## 2013-03-22 ENCOUNTER — Telehealth: Payer: Self-pay | Admitting: Family Medicine

## 2013-04-05 ENCOUNTER — Telehealth: Payer: Self-pay | Admitting: Family Medicine

## 2013-04-08 ENCOUNTER — Other Ambulatory Visit: Payer: Self-pay | Admitting: Family Medicine

## 2013-04-08 DIAGNOSIS — F411 Generalized anxiety disorder: Secondary | ICD-10-CM

## 2013-04-08 MED ORDER — ALPRAZOLAM 1 MG PO TABS: 1.0000 mg | ORAL_TABLET | Freq: Three times a day (TID) | ORAL | Status: DC | PRN

## 2013-04-08 NOTE — Telephone Encounter (Signed)
Come in and p/u rx

## 2013-04-08 NOTE — Telephone Encounter (Signed)
Please review and advise.

## 2013-04-11 NOTE — Telephone Encounter (Signed)
Pt notified to pick up rx at front desk for xanax

## 2013-06-21 ENCOUNTER — Other Ambulatory Visit: Payer: Self-pay | Admitting: Family Medicine

## 2013-07-08 ENCOUNTER — Ambulatory Visit (INDEPENDENT_AMBULATORY_CARE_PROVIDER_SITE_OTHER): Payer: Medicare Other | Admitting: *Deleted

## 2013-07-08 DIAGNOSIS — I4729 Other ventricular tachycardia: Secondary | ICD-10-CM

## 2013-07-08 DIAGNOSIS — I472 Ventricular tachycardia: Secondary | ICD-10-CM

## 2013-07-08 DIAGNOSIS — I428 Other cardiomyopathies: Secondary | ICD-10-CM

## 2013-07-08 LAB — MDC_IDC_ENUM_SESS_TYPE_INCLINIC
Battery Voltage: 2.74 V
Brady Statistic RA Percent Paced: 0 %
Brady Statistic RV Percent Paced: 99 %
Date Time Interrogation Session: 20150119050000
HighPow Impedance: 47 Ohm
HighPow Impedance: 48 Ohm
Implantable Pulse Generator Serial Number: 561795
Lead Channel Impedance Value: 608 Ohm
Lead Channel Impedance Value: 680 Ohm
Lead Channel Impedance Value: 774 Ohm
Lead Channel Pacing Threshold Amplitude: 0.8 V
Lead Channel Pacing Threshold Amplitude: 1 V
Lead Channel Pacing Threshold Amplitude: 1.6 V
Lead Channel Pacing Threshold Pulse Width: 0.4 ms
Lead Channel Pacing Threshold Pulse Width: 0.4 ms
Lead Channel Pacing Threshold Pulse Width: 0.4 ms
Lead Channel Sensing Intrinsic Amplitude: 19.5 mV
Lead Channel Sensing Intrinsic Amplitude: 24.1 mV
Lead Channel Sensing Intrinsic Amplitude: 4.5 mV
Lead Channel Setting Pacing Amplitude: 2 V
Lead Channel Setting Pacing Amplitude: 2.4 V
Lead Channel Setting Pacing Amplitude: 2.6 V
Lead Channel Setting Pacing Pulse Width: 0.4 ms
Lead Channel Setting Pacing Pulse Width: 0.8 ms
Zone Setting Detection Interval: 285 ms
Zone Setting Detection Interval: 363 ms

## 2013-07-08 NOTE — Progress Notes (Signed)
ICD check in clinic. Normal device function. No ventricular high rate episodes. Battery voltage 2.74 V. 3 modes switches--total time 1 second. ROV in 3 mths w/JA.

## 2013-07-12 ENCOUNTER — Encounter: Payer: Self-pay | Admitting: Physician Assistant

## 2013-07-12 ENCOUNTER — Ambulatory Visit (INDEPENDENT_AMBULATORY_CARE_PROVIDER_SITE_OTHER): Payer: Medicare Other | Admitting: Physician Assistant

## 2013-07-12 VITALS — BP 141/78 | HR 58 | Ht 68.0 in | Wt 136.0 lb

## 2013-07-12 DIAGNOSIS — E785 Hyperlipidemia, unspecified: Secondary | ICD-10-CM

## 2013-07-12 DIAGNOSIS — I10 Essential (primary) hypertension: Secondary | ICD-10-CM

## 2013-07-12 DIAGNOSIS — F17200 Nicotine dependence, unspecified, uncomplicated: Secondary | ICD-10-CM

## 2013-07-12 DIAGNOSIS — R55 Syncope and collapse: Secondary | ICD-10-CM

## 2013-07-12 DIAGNOSIS — I428 Other cardiomyopathies: Secondary | ICD-10-CM

## 2013-07-12 DIAGNOSIS — I472 Ventricular tachycardia: Secondary | ICD-10-CM

## 2013-07-12 DIAGNOSIS — I6529 Occlusion and stenosis of unspecified carotid artery: Secondary | ICD-10-CM

## 2013-07-12 DIAGNOSIS — I2510 Atherosclerotic heart disease of native coronary artery without angina pectoris: Secondary | ICD-10-CM

## 2013-07-12 DIAGNOSIS — I4729 Other ventricular tachycardia: Secondary | ICD-10-CM

## 2013-07-12 LAB — CBC WITH DIFFERENTIAL/PLATELET
Basophils Absolute: 0.1 10*3/uL (ref 0.0–0.1)
Basophils Relative: 0.8 % (ref 0.0–3.0)
EOS PCT: 3.1 % (ref 0.0–5.0)
Eosinophils Absolute: 0.3 10*3/uL (ref 0.0–0.7)
HCT: 36.7 % — ABNORMAL LOW (ref 39.0–52.0)
HEMOGLOBIN: 12.4 g/dL — AB (ref 13.0–17.0)
Lymphocytes Relative: 26.2 % (ref 12.0–46.0)
Lymphs Abs: 2.4 10*3/uL (ref 0.7–4.0)
MCHC: 33.9 g/dL (ref 30.0–36.0)
MCV: 95.9 fl (ref 78.0–100.0)
Monocytes Absolute: 1.1 10*3/uL — ABNORMAL HIGH (ref 0.1–1.0)
Monocytes Relative: 11.8 % (ref 3.0–12.0)
NEUTROS ABS: 5.3 10*3/uL (ref 1.4–7.7)
Neutrophils Relative %: 58.1 % (ref 43.0–77.0)
Platelets: 380 10*3/uL (ref 150.0–400.0)
RBC: 3.82 Mil/uL — AB (ref 4.22–5.81)
RDW: 12.9 % (ref 11.5–14.6)
WBC: 9.1 10*3/uL (ref 4.5–10.5)

## 2013-07-12 LAB — BASIC METABOLIC PANEL
BUN: 11 mg/dL (ref 6–23)
CALCIUM: 9.5 mg/dL (ref 8.4–10.5)
CO2: 27 meq/L (ref 19–32)
Chloride: 92 mEq/L — ABNORMAL LOW (ref 96–112)
Creatinine, Ser: 1 mg/dL (ref 0.4–1.5)
GFR: 75.02 mL/min (ref >60.00)
Glucose, Bld: 87 mg/dL (ref 70–99)
Potassium: 4.7 mEq/L (ref 3.5–5.1)
SODIUM: 127 meq/L — AB (ref 135–145)

## 2013-07-12 LAB — TSH: TSH: 1.2 u[IU]/mL (ref 0.35–5.50)

## 2013-07-12 MED ORDER — AMLODIPINE BESYLATE 5 MG PO TABS: 2.5000 mg | ORAL_TABLET | Freq: Every day | ORAL | Status: DC

## 2013-07-12 NOTE — Progress Notes (Signed)
[address], [name], [place]  [address] Phone: [phone] Fax:  [phone]  Date:  07/12/2013   ID:  [name] January 07, 1941, MRN [id number]  PCP:  Redge Gainer, MD  Cardiologist:  Dr. Minus Breeding   Electrophysiologist:  Dr. [name]    History of Present Illness: [name] is a 73 y.o. male with a hx of CAD, s/p PTCA of RCA in 6294, NICM, systolic CHF, HTN, HL, LBBB, s/p CRT-D.  LHC (09/2001):  pCFX 30, prox/mid/distal RCA 30, EF 25%.  His EF has improved back to normal.  Echo (09/2010):  Mild LVH, EF 55-60%, Gr 1 DD, mild MR, mild LAE, mild increased PASP.  Carotid US (09/2012):  Bilateral 0-39% (no f/u needed).   Last seen by Dr. Minus Breeding in 09/2012.    He presents today with complaints of a recent hx of syncope.  He had an episode about 3 weeks ago where he was standing in his kitchen and became diaphoretic and dizzy.  He laid down and felt better after several minutes.  Then, about 1-2 weeks ago, he got up in the middle of the night to go to the bathroom.  He passed out while walking down his hallway.  He had no prodrome.  He had no sequelae upon awakening.  He denies recent hx of chest pain.  He denies DOE, orthopnea, PND, edema.  He felt like his K+ was probably abnormal b/c he was having LE muscle cramps.  He started drinking Sunny-D and has not had a problem since.  He does note a recent hx of gastroenteritis but it did not predate his episode of dizziness or syncope.  Of note, he had his device interrogated 1/19 and he had no ventricular arrhythmias noted during this time frame.    Recent Labs: No results found for requested labs within last 365 days.   Wt Readings from Last 3 Encounters:  07/12/13 136 lb (61.689 kg)  03/12/13 136 lb 3.2 oz (61.78 kg)  01/10/13 139 lb (63.05 kg)     Past Medical History  Diagnosis Date  . Nonischemic cardiomyopathy     EF was 20% (60% last echo 2012)  . Tobacco abuse   . Carotid artery disease   .  EtOH dependence   . Peptic ulcer disease   . Dyslipidemia   . CAD (coronary artery disease)     Nonobstructive cath 2003  . Hypertension   . Hyperlipidemia   . Insomnia   . Depression   . Pacemaker     and defibrillator  . Arthritis   . COPD (chronic obstructive pulmonary disease)     Current Outpatient Prescriptions  Medication Sig Dispense Refill  . ALPRAZolam (XANAX) 1 MG tablet Take 1 mg by mouth as needed for sleep.      Marland Kitchen amLODipine (NORVASC) 5 MG tablet TAKE ONE TABLET BY MOUTH ONCE DAILY  90 tablet  0  . aspirin 81 MG tablet Take 81 mg by mouth daily.      . cetirizine (ZYRTEC) 10 MG tablet Take 1 tablet (10 mg total) by mouth daily. For allergies      . lisinopril (PRINIVIL,ZESTRIL) 40 MG tablet Take 1 tablet (40 mg total) by mouth daily. For high blood pressure control  90 tablet  3  . metoprolol (LOPRESSOR) 100 MG tablet Take 1 tablet (100 mg total) by mouth 2 (two) times daily. For high blood pressure control  180 tablet  3  .  omeprazole (PRILOSEC) 20 MG capsule Take 1 capsule (20 mg total) by mouth 2 (two) times daily. For acid reflux  180 capsule  3  . pravastatin (PRAVACHOL) 40 MG tablet TAKE ONE TABLET BY MOUTH AT BEDTIME  90 tablet  3   No current facility-administered medications for this visit.    Allergies:   Sudafed and Avelox   Social History:  The patient  reports that he has been smoking Cigarettes.  He has a 11.25 pack-year smoking history. He quit smokeless tobacco use about 24 years ago. His smokeless tobacco use included Chew. He reports that he does not drink alcohol or use illicit drugs.   Family History:  The patient's family history includes Colon polyps in his sister.   ROS:  Please see the history of present illness.      All other systems reviewed and negative.   PHYSICAL EXAM: VS:  BP 140/70  Pulse 55  Ht 5\' 8"  (1.727 m)  Wt 136 lb (61.689 kg)  BMI 20.68 kg/m2  Filed Vitals:   07/12/13 1149 07/12/13 1151 07/12/13 1153 07/12/13 1156    BP: 133/78 128/76 128/77 141/78  Pulse: 59 61 59 58  Height:      Weight:         Well nourished, well developed, in no acute distress HEENT: normal Neck: no JVD Cardiac:  normal S1, S2; RRR; no murmur Lungs:  clear to auscultation bilaterally, no wheezing, rhonchi or rales Abd: soft, nontender, no hepatomegaly Ext: no edema Skin: warm and dry Neuro:  CNs 2-12 intact, no focal abnormalities noted  EKG:  Sinus brady, HR 55, V paced     ASSESSMENT AND PLAN:  1. Syncope:  Etiology not clear.  Recent device interrogation without arrhythmias.  His BP does drop somewhat with lying to standing.  HR remains stable.  He may have a component of orthostatic intolerance.  I will reduce his Amlodipine to 2.5 mg QD.  Check BMET, CBC, TSH today.  I suggested he try OTC compression stockings and to get up slowly from lying/sitting to standing.  In light of the fact he had no arrhythmias on his device interrogation, I reviewed his case with Dr. Minus Breeding.  He does not require further cardiac workup.   2. CAD:  No angina.  Continue ASA and statin.   3. Non-Ischemic Cardiomyopathy:  Continue current dose of beta blocker, and ACEI.  Last echo in 2012 demonstrated normal LVF.   4. Hypertension:  Controlled.  Reduce Amlodipine as noted above.   5. Hyperlipidemia:  Continue statin.   6. Ventricular Tachycardia, s/p CRT-D:  Recent interrogation of device demonstrated no evidence of ventricular arrhythmias.   7. Carotid Stenosis:  Carotid US in 09/2012 with minimal plaque and no further follow up recommended.   8. Tobacco Abuse:  He is not ready to quit.  9. Disposition:  F/u with Dr. Minus Breeding in 09/2013 for annual follow up.   Signed, Richardson Dopp, PA-C  07/12/2013 11:38 AM

## 2013-07-12 NOTE — Patient Instructions (Signed)
DECREASE AMLODIPINE TO 2.5 MG DAILY  LAB WORK TODAY; BMET, CBC W/DIFF, TSH  PICK UP SOME OTC COMPRESSION STOCKINGS AND MAKE SURE TO WEAR DAILY  MAKE SURE TO GET SLOWLY WHEN GOING FROM LAYING DOWN TO SITTING AND STANDING  WE WILL SEND OUT A REMINDER LETTER IN THE NEXT 1-2 MONTHS TO MAKE AND APPT TO SEE DR. [facility] IN April 2015

## 2013-07-15 ENCOUNTER — Telehealth: Payer: Self-pay | Admitting: Physician Assistant

## 2013-07-15 NOTE — Telephone Encounter (Signed)
I s/w pt and advised lab results ahd not yet been read, but I will send results to PA to look at and that when we come back on wed 1/28 I w/cb with PA recommendations. Pt said ok and thank you.

## 2013-07-15 NOTE — Telephone Encounter (Signed)
New message     Want lab results from 07-12-13

## 2013-07-17 NOTE — Telephone Encounter (Signed)
pt notified about lab results once read by PA today. Pt said thank you.

## 2013-07-17 NOTE — Telephone Encounter (Signed)
I apologize for the labs not being addressed.  The results did not come to me.  Not sure who they went to.  Chronic low Na is unchanged (over many years). Renal fxn, K+ ok Hgb stable TSH ok Continue with current treatment plan. Richardson Dopp, PA-C   07/17/2013 1:23 PM

## 2013-07-18 ENCOUNTER — Encounter: Payer: Self-pay | Admitting: Internal Medicine

## 2013-07-25 ENCOUNTER — Other Ambulatory Visit: Payer: Self-pay | Admitting: *Deleted

## 2013-07-25 DIAGNOSIS — E871 Hypo-osmolality and hyponatremia: Secondary | ICD-10-CM

## 2013-07-29 ENCOUNTER — Other Ambulatory Visit (INDEPENDENT_AMBULATORY_CARE_PROVIDER_SITE_OTHER): Payer: Medicare Other

## 2013-07-29 DIAGNOSIS — R7989 Other specified abnormal findings of blood chemistry: Secondary | ICD-10-CM

## 2013-07-30 ENCOUNTER — Other Ambulatory Visit: Payer: Self-pay | Admitting: *Deleted

## 2013-07-30 ENCOUNTER — Other Ambulatory Visit (INDEPENDENT_AMBULATORY_CARE_PROVIDER_SITE_OTHER): Payer: Medicare Other

## 2013-07-30 DIAGNOSIS — E875 Hyperkalemia: Secondary | ICD-10-CM

## 2013-07-30 DIAGNOSIS — R7989 Other specified abnormal findings of blood chemistry: Secondary | ICD-10-CM

## 2013-07-30 LAB — BMP8+EGFR
BUN/Creatinine Ratio: 11 (ref 10–22)
BUN: 11 mg/dL (ref 8–27)
CHLORIDE: 91 mmol/L — AB (ref 97–108)
CO2: 23 mmol/L (ref 18–29)
Calcium: 10 mg/dL (ref 8.6–10.2)
Creatinine, Ser: 0.98 mg/dL (ref 0.76–1.27)
GFR calc Af Amer: 90 mL/min/{1.73_m2} (ref >59)
GFR, EST NON AFRICAN AMERICAN: 78 mL/min/{1.73_m2} (ref >59)
GLUCOSE: 104 mg/dL — AB (ref 65–99)
POTASSIUM: 6 mmol/L — AB (ref 3.5–5.2)
SODIUM: 130 mmol/L — AB (ref 134–144)

## 2013-07-31 ENCOUNTER — Telehealth: Payer: Self-pay | Admitting: Cardiology

## 2013-07-31 DIAGNOSIS — E875 Hyperkalemia: Secondary | ICD-10-CM

## 2013-07-31 LAB — BMP8+EGFR
BUN/Creatinine Ratio: 12 (ref 10–22)
BUN: 14 mg/dL (ref 8–27)
CALCIUM: 10.3 mg/dL — AB (ref 8.6–10.2)
CHLORIDE: 92 mmol/L — AB (ref 97–108)
CO2: 23 mmol/L (ref 18–29)
Creatinine, Ser: 1.18 mg/dL (ref 0.76–1.27)
GFR calc Af Amer: 72 mL/min/{1.73_m2} (ref >59)
GFR calc non Af Amer: 62 mL/min/{1.73_m2} (ref >59)
GLUCOSE: 179 mg/dL — AB (ref 65–99)
Potassium: 5.8 mmol/L — ABNORMAL HIGH (ref 3.5–5.2)
SODIUM: 131 mmol/L — AB (ref 134–144)

## 2013-07-31 NOTE — Telephone Encounter (Signed)
New message         Pt would like lab results from yesterday 07/30/13

## 2013-07-31 NOTE — Telephone Encounter (Addendum)
Spoke with patient and gave him results but let him know his K is high and I will need to let Scott review and call him back.  He says he drinks a lot of OJ.  I have discussed with Margaret Pyle he recommends for patient to stop lisinopril for now and no more OJ.  Repeat BMP on Fri.  Patient aware

## 2013-08-01 ENCOUNTER — Telehealth: Payer: Self-pay | Admitting: Cardiology

## 2013-08-01 NOTE — Telephone Encounter (Signed)
New message    Patient stated he spoke with someone on yesterday they stated for him to stop taken a medication . Asking for clarification on message due to him forgetting what was told to him.

## 2013-08-01 NOTE — Telephone Encounter (Signed)
Left message for pt of orders to stop Lisinopril, stop drinking OJ and repeat lab on Friday.  Requested he call back with further questions or concerns.

## 2013-08-02 ENCOUNTER — Other Ambulatory Visit (INDEPENDENT_AMBULATORY_CARE_PROVIDER_SITE_OTHER): Payer: Medicare Other

## 2013-08-02 ENCOUNTER — Other Ambulatory Visit: Payer: Self-pay

## 2013-08-02 DIAGNOSIS — E875 Hyperkalemia: Secondary | ICD-10-CM

## 2013-08-02 NOTE — Progress Notes (Signed)
PT CAME IN FOR LABS ONLY 

## 2013-08-03 LAB — BMP8+EGFR
BUN/Creatinine Ratio: 14 (ref 10–22)
BUN: 15 mg/dL (ref 8–27)
CALCIUM: 9.8 mg/dL (ref 8.6–10.2)
CHLORIDE: 92 mmol/L — AB (ref 97–108)
CO2: 24 mmol/L (ref 18–29)
Creatinine, Ser: 1.08 mg/dL (ref 0.76–1.27)
GFR calc Af Amer: 80 mL/min/{1.73_m2} (ref >59)
GFR, EST NON AFRICAN AMERICAN: 69 mL/min/{1.73_m2} (ref >59)
GLUCOSE: 111 mg/dL — AB (ref 65–99)
Potassium: 5.2 mmol/L (ref 3.5–5.2)
Sodium: 131 mmol/L — ABNORMAL LOW (ref 134–144)

## 2013-08-07 ENCOUNTER — Telehealth: Payer: Self-pay | Admitting: Family Medicine

## 2013-08-07 NOTE — Telephone Encounter (Signed)
Patient aware.

## 2013-09-30 ENCOUNTER — Other Ambulatory Visit: Payer: Self-pay | Admitting: Family Medicine

## 2013-10-04 ENCOUNTER — Other Ambulatory Visit: Payer: Self-pay | Admitting: Family Medicine

## 2013-10-07 ENCOUNTER — Telehealth: Payer: Self-pay | Admitting: Family Medicine

## 2013-10-07 NOTE — Telephone Encounter (Signed)
Scheduled pt for earlier appt Verbalizes understanding

## 2013-10-07 NOTE — Telephone Encounter (Signed)
Last ov 03/12/13. Last refill 09/01/13.

## 2013-10-08 ENCOUNTER — Encounter: Payer: Self-pay | Admitting: Family Medicine

## 2013-10-08 ENCOUNTER — Ambulatory Visit (INDEPENDENT_AMBULATORY_CARE_PROVIDER_SITE_OTHER): Payer: Medicare Other | Admitting: Family Medicine

## 2013-10-08 VITALS — BP 165/87 | HR 70 | Temp 97.6°F | Ht 66.0 in | Wt 136.8 lb

## 2013-10-08 DIAGNOSIS — I10 Essential (primary) hypertension: Secondary | ICD-10-CM

## 2013-10-08 DIAGNOSIS — R350 Frequency of micturition: Secondary | ICD-10-CM

## 2013-10-08 DIAGNOSIS — F411 Generalized anxiety disorder: Secondary | ICD-10-CM

## 2013-10-08 LAB — POCT CBC
Granulocyte percent: 71.1 %G (ref 37–80)
HCT, POC: 40.8 % — AB (ref 43.5–53.7)
Hemoglobin: 13.4 g/dL — AB (ref 14.1–18.1)
Lymph, poc: 2 (ref 0.6–3.4)
MCH, POC: 31.8 pg — AB (ref 27–31.2)
MCHC: 32.7 g/dL (ref 31.8–35.4)
MCV: 97.2 fL — AB (ref 80–97)
MPV: 7.5 fL (ref 0–99.8)
POC Granulocyte: 7 — AB (ref 2–6.9)
POC LYMPH PERCENT: 20.3 %L (ref 10–50)
Platelet Count, POC: 309 10*3/uL (ref 142–424)
RBC: 4.2 M/uL — AB (ref 4.69–6.13)
RDW, POC: 12.5 %
WBC: 9.9 10*3/uL (ref 4.6–10.2)

## 2013-10-08 MED ORDER — ALPRAZOLAM 1 MG PO TABS: 1.0000 mg | ORAL_TABLET | Freq: Two times a day (BID) | ORAL | Status: DC | PRN

## 2013-10-08 NOTE — Progress Notes (Signed)
   Subjective:    Patient ID: [name], male    DOB: 1941-05-10, 73 y.o.   MRN: [id number]  HPI  This 73 y.o. male presents for evaluation of needing refill on his xanax.  He has hypertension and has had Hyponatremia and had his hctz dc'd and now is taking amlodipine.  He has nightmares and has difficulties sleeping he states related to [employer]. He has been on xanax for a long time he states and when he goes w/o it he cannot rest or sleep.  He has been out of xanax for a few days.  Review of Systems    No chest pain, SOB, HA, dizziness, vision change, N/V, diarrhea, constipation, dysuria, urinary urgency or frequency, myalgias, arthralgias or rash.  Objective:   Physical Exam   Vital signs noted  Well developed well nourished male.  HEENT - Head atraumatic Normocephalic                Eyes - PERRLA, Conjuctiva - clear Sclera- Clear EOMI                Ears - EAC's Wnl TM's Wnl Gross Hearing WNL                Throat - oropharanx wnl Respiratory - Lungs CTA bilateral Cardiac - RRR S1 and S2 without murmur GI - Abdomen soft Nontender and bowel sounds active x 4 Extremities - No edema. Neuro - Grossly intact.     Assessment & Plan:  Anxiety state, unspecified - Plan: ALPRAZolam (XANAX) 1 MG tablet, POCT CBC, CMP14+EGFR, Thyroid Panel With TSH  Essential hypertension, benign - Plan: ALPRAZolam (XANAX) 1 MG tablet, POCT CBC, CMP14+EGFR, Thyroid Panel With TSH  Urine frequency - Plan: PSA, total and free  Lysbeth Penner FNP

## 2013-10-09 LAB — CMP14+EGFR
ALT: 14 IU/L (ref 0–44)
AST: 29 IU/L (ref 0–40)
Albumin/Globulin Ratio: 1.7 (ref 1.1–2.5)
Albumin: 4.8 g/dL (ref 3.5–4.8)
Alkaline Phosphatase: 77 IU/L (ref 39–117)
BUN/Creatinine Ratio: 10 (ref 10–22)
BUN: 11 mg/dL (ref 8–27)
CO2: 21 mmol/L (ref 18–29)
Calcium: 10.2 mg/dL (ref 8.6–10.2)
Chloride: 86 mmol/L — ABNORMAL LOW (ref 97–108)
Creatinine, Ser: 1.11 mg/dL (ref 0.76–1.27)
GFR calc Af Amer: 77 mL/min/{1.73_m2} (ref >59)
GFR calc non Af Amer: 67 mL/min/{1.73_m2} (ref >59)
Globulin, Total: 2.8 g/dL (ref 1.5–4.5)
Glucose: 93 mg/dL (ref 65–99)
Potassium: 5.6 mmol/L — ABNORMAL HIGH (ref 3.5–5.2)
Sodium: 126 mmol/L — ABNORMAL LOW (ref 134–144)
Total Bilirubin: 0.5 mg/dL (ref 0.0–1.2)
Total Protein: 7.6 g/dL (ref 6.0–8.5)

## 2013-10-09 LAB — THYROID PANEL WITH TSH
Free Thyroxine Index: 2.3 (ref 1.2–4.9)
T3 Uptake Ratio: 34 % (ref 24–39)
T4, Total: 6.7 ug/dL (ref 4.5–12.0)
TSH: 1.3 u[IU]/mL (ref 0.450–4.500)

## 2013-10-09 LAB — PSA, TOTAL AND FREE
PSA, Free Pct: 30.8 %
PSA, Free: 0.74 ng/mL
PSA: 2.4 ng/mL (ref 0.0–4.0)

## 2013-10-10 ENCOUNTER — Telehealth: Payer: Self-pay | Admitting: Family Medicine

## 2013-10-10 DIAGNOSIS — E875 Hyperkalemia: Secondary | ICD-10-CM

## 2013-10-11 ENCOUNTER — Other Ambulatory Visit: Payer: Self-pay | Admitting: Family Medicine

## 2013-10-11 NOTE — Telephone Encounter (Signed)
Message copied by Marin Olp on Fri Oct 11, 2013 11:57 AM ------      Message from: Lysbeth Penner      Created: Fri Oct 11, 2013  9:45 AM       His sodium is way too low and needs to stop drinking free h20 and needs to concentrate fluids and needs to be having sodium in diet.  He needs to follow up ASAP for stat NA and K.  His potassium is elevated.  He needs to do this today ------

## 2013-10-11 NOTE — Telephone Encounter (Signed)
Notified pt of lab results Stressed the importance of rechecking K+ and NA Pt will try to come in today for these labs Verbalizes understanding

## 2013-10-14 ENCOUNTER — Other Ambulatory Visit (INDEPENDENT_AMBULATORY_CARE_PROVIDER_SITE_OTHER): Payer: Medicare Other

## 2013-10-14 DIAGNOSIS — E875 Hyperkalemia: Secondary | ICD-10-CM

## 2013-10-14 NOTE — Progress Notes (Signed)
Pt came in for labs only 

## 2013-10-14 NOTE — Addendum Note (Signed)
Addended by: Wyline Mood on: 10/14/2013 08:48 AM   Modules accepted: Orders

## 2013-10-15 LAB — BMP8+EGFR
BUN/Creatinine Ratio: 13 (ref 10–22)
BUN: 15 mg/dL (ref 8–27)
CALCIUM: 9.7 mg/dL (ref 8.6–10.2)
CO2: 23 mmol/L (ref 18–29)
CREATININE: 1.18 mg/dL (ref 0.76–1.27)
Chloride: 93 mmol/L — ABNORMAL LOW (ref 97–108)
GFR calc Af Amer: 72 mL/min/{1.73_m2} (ref >59)
GFR calc non Af Amer: 62 mL/min/{1.73_m2} (ref >59)
GLUCOSE: 117 mg/dL — AB (ref 65–99)
Potassium: 5.3 mmol/L — ABNORMAL HIGH (ref 3.5–5.2)
Sodium: 131 mmol/L — ABNORMAL LOW (ref 134–144)

## 2013-10-16 ENCOUNTER — Other Ambulatory Visit: Payer: Self-pay | Admitting: *Deleted

## 2013-10-16 DIAGNOSIS — E875 Hyperkalemia: Secondary | ICD-10-CM

## 2013-10-16 DIAGNOSIS — I5022 Chronic systolic (congestive) heart failure: Secondary | ICD-10-CM

## 2013-10-16 DIAGNOSIS — E871 Hypo-osmolality and hyponatremia: Secondary | ICD-10-CM

## 2013-10-30 ENCOUNTER — Encounter: Payer: Self-pay | Admitting: Cardiology

## 2013-10-30 ENCOUNTER — Ambulatory Visit (INDEPENDENT_AMBULATORY_CARE_PROVIDER_SITE_OTHER): Payer: Medicare Other | Admitting: Cardiology

## 2013-10-30 ENCOUNTER — Encounter: Payer: Self-pay | Admitting: *Deleted

## 2013-10-30 VITALS — BP 150/83 | HR 74 | Ht 68.0 in | Wt 138.0 lb

## 2013-10-30 DIAGNOSIS — I4729 Other ventricular tachycardia: Secondary | ICD-10-CM

## 2013-10-30 DIAGNOSIS — I6529 Occlusion and stenosis of unspecified carotid artery: Secondary | ICD-10-CM

## 2013-10-30 DIAGNOSIS — I472 Ventricular tachycardia, unspecified: Secondary | ICD-10-CM

## 2013-10-30 DIAGNOSIS — I2510 Atherosclerotic heart disease of native coronary artery without angina pectoris: Secondary | ICD-10-CM

## 2013-10-30 DIAGNOSIS — I5022 Chronic systolic (congestive) heart failure: Secondary | ICD-10-CM

## 2013-10-30 DIAGNOSIS — I428 Other cardiomyopathies: Secondary | ICD-10-CM

## 2013-10-30 NOTE — Patient Instructions (Signed)
The current medical regimen is effective;  continue present plan and medications.  Follow up in 1 year with Dr Hochrein.  You will receive a letter in the mail 2 months before you are due.  Please call us when you receive this letter to schedule your follow up appointment.  

## 2013-10-30 NOTE — Progress Notes (Signed)
HPI The patient presented for one year followup. Since I last saw him he has had no cardiovascular complaints.  The patient denies any new symptoms such as chest discomfort, neck or arm discomfort. There has been no new shortness of breath, PND or orthopnea. There have been no reported palpitations, presyncope or syncope.  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 He he continues to smoke cigarettes.  Allergies  Allergen Reactions  . Sudafed [Pseudoephedrine Hcl]   . Avelox [Moxifloxacin Hcl In Nacl]     Current Outpatient Prescriptions  Medication Sig Dispense Refill  . ALPRAZolam (XANAX) 1 MG tablet Take 1 tablet (1 mg total) by mouth 2 (two) times daily as needed.  60 tablet  3  . aspirin 81 MG tablet Take 81 mg by mouth daily.      . cetirizine (ZYRTEC) 10 MG tablet Take 1 tablet (10 mg total) by mouth daily. For allergies      . lisinopril (PRINIVIL,ZESTRIL) 40 MG tablet Take 1 tablet (40 mg total) by mouth daily. For high blood pressure control  90 tablet  3  . metoprolol (LOPRESSOR) 100 MG tablet Take 1 tablet (100 mg total) by mouth 2 (two) times daily. For high blood pressure control  180 tablet  3  . omeprazole (PRILOSEC) 20 MG capsule Take 1 capsule (20 mg total) by mouth 2 (two) times daily. For acid reflux  180 capsule  3  . pravastatin (PRAVACHOL) 40 MG tablet TAKE ONE TABLET BY MOUTH AT BEDTIME  90 tablet  3   No current facility-administered medications for this visit.    Past Medical History  Diagnosis Date  . Nonischemic cardiomyopathy     EF was 20% (60% last echo 2012)  . Tobacco abuse   . Carotid artery disease   . EtOH dependence   . Peptic ulcer disease   . Dyslipidemia   . CAD (coronary artery disease)     Nonobstructive cath 2003  . Hypertension   . Hyperlipidemia   . Insomnia   . Depression   . Pacemaker     and defibrillator  . Arthritis   . COPD (chronic obstructive pulmonary disease)     Past Surgical  History  Procedure Laterality Date  . Ulcer surgery    . Biventricular defibrillator implantation      [employer]  . Coronary stent placement      ROS: As stated in the HPI and negative for all other systems.  PHYSICAL EXAM BP 150/83  Pulse 74  Ht 5\' 8"  (1.727 m)  Wt 138 lb (62.596 kg)  BMI 20.99 kg/m2 GENERAL:  Well appearing NECK:  No jugular venous distention, waveform within normal limits, carotid upstroke brisk and symmetric, no bruits, no thyromegaly LYMPHATICS:  No cervical, inguinal adenopathy LUNGS:  Clear to auscultation bilaterally BACK:  No CVA tenderness,  CHEST:  ICD pocket HEART:  PMI not displaced or sustained,S1 and S2 within normal limits, no S3, no S4, no clicks, no rubs, no murmurs ABD:  Flat, positive bowel sounds normal in frequency in pitch, no bruits, no rebound, no guarding, no midline pulsatile mass, no hepatomegaly, no splenomegaly EXT:  2 plus pulses throughout, no edema, no cyanosis no clubbing SKIN:  No rashes no nodules   EKG:  Sinus bradycardia, rate 68, with ventricular pacing. 10/30/2013   ASSESSMENT AND PLAN  VENTRICULAR TACHYCARDIA:  He is up to date with ICD follow up.  CARDIOMOPATHY:  His EF was improved still at the last echo in 2012.   He has no new symptoms.  He will continue the medicines as listed.  TOBACCO ABUSE:   He still cannot quit smoking we have talked about this repeatedly.  DYSLIPIDEMIA:  This is followed closely by his primary provider.

## 2013-10-31 ENCOUNTER — Ambulatory Visit: Payer: Medicare Other | Admitting: Family Medicine

## 2013-12-16 ENCOUNTER — Other Ambulatory Visit (INDEPENDENT_AMBULATORY_CARE_PROVIDER_SITE_OTHER): Payer: Medicare Other

## 2013-12-16 DIAGNOSIS — I5022 Chronic systolic (congestive) heart failure: Secondary | ICD-10-CM

## 2013-12-16 DIAGNOSIS — E871 Hypo-osmolality and hyponatremia: Secondary | ICD-10-CM

## 2013-12-16 DIAGNOSIS — E875 Hyperkalemia: Secondary | ICD-10-CM

## 2013-12-17 ENCOUNTER — Telehealth: Payer: Self-pay | Admitting: Cardiology

## 2013-12-17 LAB — BMP8+EGFR
BUN/Creatinine Ratio: 11 (ref 10–22)
BUN: 11 mg/dL (ref 8–27)
CHLORIDE: 91 mmol/L — AB (ref 97–108)
CO2: 24 mmol/L (ref 18–29)
Calcium: 9.6 mg/dL (ref 8.6–10.2)
Creatinine, Ser: 1.03 mg/dL (ref 0.76–1.27)
GFR calc Af Amer: 85 mL/min/{1.73_m2} (ref >59)
GFR calc non Af Amer: 73 mL/min/{1.73_m2} (ref >59)
GLUCOSE: 112 mg/dL — AB (ref 65–99)
Potassium: 4.9 mmol/L (ref 3.5–5.2)
Sodium: 132 mmol/L — ABNORMAL LOW (ref 134–144)

## 2013-12-17 NOTE — Telephone Encounter (Signed)
I spoke with pt & he is aware of his lab results. He was pleased his sodium has improved Horton Chin RN

## 2013-12-17 NOTE — Telephone Encounter (Signed)
New Message  Pt called to follow Up on lab results. Please call

## 2013-12-20 ENCOUNTER — Other Ambulatory Visit: Payer: Self-pay | Admitting: Family Medicine

## 2013-12-23 NOTE — Telephone Encounter (Signed)
Last seen 10/08/13  B [place]  This med not on EPIC list  Requesting 90 day supply

## 2014-01-06 ENCOUNTER — Other Ambulatory Visit: Payer: Self-pay | Admitting: Family Medicine

## 2014-01-30 ENCOUNTER — Ambulatory Visit (INDEPENDENT_AMBULATORY_CARE_PROVIDER_SITE_OTHER): Payer: Medicare Other | Admitting: Family Medicine

## 2014-01-30 ENCOUNTER — Encounter: Payer: Self-pay | Admitting: Family Medicine

## 2014-01-30 VITALS — BP 130/77 | HR 62 | Temp 97.4°F | Ht 66.0 in | Wt 140.0 lb

## 2014-01-30 DIAGNOSIS — E785 Hyperlipidemia, unspecified: Secondary | ICD-10-CM

## 2014-01-30 DIAGNOSIS — I10 Essential (primary) hypertension: Secondary | ICD-10-CM

## 2014-01-30 DIAGNOSIS — F411 Generalized anxiety disorder: Secondary | ICD-10-CM

## 2014-01-30 DIAGNOSIS — K219 Gastro-esophageal reflux disease without esophagitis: Secondary | ICD-10-CM

## 2014-01-30 MED ORDER — ALPRAZOLAM 1 MG PO TABS: 1.0000 mg | ORAL_TABLET | Freq: Two times a day (BID) | ORAL | Status: DC | PRN

## 2014-01-30 NOTE — Progress Notes (Signed)
   Subjective:    Patient ID: [name], male    DOB: Apr 08, 1941, 73 y.o.   MRN: [id number]  HPI This 73 y.o. male presents for evaluation of needing refill on xanax.   Review of Systems No chest pain, SOB, HA, dizziness, vision change, N/V, diarrhea, constipation, dysuria, urinary urgency or frequency, myalgias, arthralgias or rash.     Objective:   Physical Exam  Vital signs noted  Well developed well nourished male.  HEENT - Head atraumatic Normocephalic                Eyes - PERRLA, Conjuctiva - clear Sclera- Clear EOMI                Ears - EAC's Wnl TM's Wnl Gross Hearing WNL                Nose - Nares patent                 Throat - oropharanx wnl Respiratory - Lungs CTA bilateral Cardiac - RRR S1 and S2 without murmur GI - Abdomen soft Nontender and bowel sounds active x 4 Extremities - No edema. Neuro - Grossly intact.      Assessment & Plan:  Essential hypertension, benign - Plan: ALPRAZolam (XANAX) 1 MG tablet  Gastroesophageal reflux disease without esophagitis  Other and unspecified hyperlipidemia  Anxiety state, unspecified - Plan: ALPRAZolam Duanne Moron) 1 MG tablet  Lysbeth Penner FNP

## 2014-01-31 ENCOUNTER — Other Ambulatory Visit (INDEPENDENT_AMBULATORY_CARE_PROVIDER_SITE_OTHER): Payer: Medicare Other

## 2014-01-31 DIAGNOSIS — E785 Hyperlipidemia, unspecified: Secondary | ICD-10-CM

## 2014-01-31 DIAGNOSIS — I10 Essential (primary) hypertension: Secondary | ICD-10-CM

## 2014-01-31 DIAGNOSIS — E559 Vitamin D deficiency, unspecified: Secondary | ICD-10-CM

## 2014-01-31 LAB — POCT CBC
Granulocyte percent: 64.7 %G (ref 37–80)
HEMATOCRIT: 39.5 % — AB (ref 43.5–53.7)
Hemoglobin: 13.1 g/dL — AB (ref 14.1–18.1)
Lymph, poc: 2.5 (ref 0.6–3.4)
MCH, POC: 32.2 pg — AB (ref 27–31.2)
MCHC: 33.3 g/dL (ref 31.8–35.4)
MCV: 96.8 fL (ref 80–97)
MPV: 9.2 fL (ref 0–99.8)
POC Granulocyte: 5.2 (ref 2–6.9)
POC LYMPH PERCENT: 31.3 %L (ref 10–50)
Platelet Count, POC: 293 10*3/uL (ref 142–424)
RBC: 4.1 M/uL — AB (ref 4.69–6.13)
RDW, POC: 12.9 %
WBC: 8 10*3/uL (ref 4.6–10.2)

## 2014-01-31 NOTE — Addendum Note (Signed)
Addended by: Earlene Plater on: 01/31/2014 04:30 PM   Modules accepted: Orders

## 2014-01-31 NOTE — Progress Notes (Signed)
Pt came in for lab  only 

## 2014-02-03 LAB — LIPID PANEL
CHOLESTEROL TOTAL: 199 mg/dL (ref 100–199)
Chol/HDL Ratio: 2.5 ratio units (ref 0.0–5.0)
HDL: 81 mg/dL (ref >39)
LDL CALC: 99 mg/dL (ref 0–99)
TRIGLYCERIDES: 95 mg/dL (ref 0–149)
VLDL Cholesterol Cal: 19 mg/dL (ref 5–40)

## 2014-02-03 LAB — BMP8+EGFR
BUN / CREAT RATIO: 9 — AB (ref 10–22)
BUN: 10 mg/dL (ref 8–27)
CO2: 25 mmol/L (ref 18–29)
CREATININE: 1.08 mg/dL (ref 0.76–1.27)
Calcium: 10 mg/dL (ref 8.6–10.2)
Chloride: 88 mmol/L — ABNORMAL LOW (ref 97–108)
GFR calc non Af Amer: 69 mL/min/{1.73_m2} (ref >59)
GFR, EST AFRICAN AMERICAN: 80 mL/min/{1.73_m2} (ref >59)
Glucose: 94 mg/dL (ref 65–99)
Potassium: 5.3 mmol/L — ABNORMAL HIGH (ref 3.5–5.2)
SODIUM: 128 mmol/L — AB (ref 134–144)

## 2014-02-03 LAB — NMR, LIPOPROFILE

## 2014-02-03 LAB — HEPATIC FUNCTION PANEL
ALBUMIN: 4.6 g/dL (ref 3.5–4.8)
ALK PHOS: 77 IU/L (ref 39–117)
ALT: 21 IU/L (ref 0–44)
AST: 34 IU/L (ref 0–40)
BILIRUBIN TOTAL: 0.4 mg/dL (ref 0.0–1.2)
Bilirubin, Direct: 0.17 mg/dL (ref 0.00–0.40)
Total Protein: 7.5 g/dL (ref 6.0–8.5)

## 2014-02-03 LAB — VITAMIN D 25 HYDROXY (VIT D DEFICIENCY, FRACTURES): Vit D, 25-Hydroxy: 43.2 ng/mL (ref 30.0–100.0)

## 2014-02-04 ENCOUNTER — Other Ambulatory Visit: Payer: Self-pay | Admitting: Family Medicine

## 2014-02-25 ENCOUNTER — Telehealth: Payer: Self-pay | Admitting: Internal Medicine

## 2014-02-25 NOTE — Telephone Encounter (Signed)
Patient states "His defibrillator has been beeping for 3-4 days.  He does have a latitude transmitter but prefers to come in for an office visit. "  Office visit scheduled for 02/26/14 @ 11:30am with Dr. Rayann Heman.

## 2014-02-25 NOTE — Telephone Encounter (Signed)
Called to let us know that his device started beeping Saturday. Beeps about every 4 hours  No other symptoms.

## 2014-02-25 NOTE — Telephone Encounter (Signed)
Follow up       ICD is beeping----please advise

## 2014-02-26 ENCOUNTER — Telehealth: Payer: Self-pay | Admitting: Internal Medicine

## 2014-02-26 ENCOUNTER — Ambulatory Visit (INDEPENDENT_AMBULATORY_CARE_PROVIDER_SITE_OTHER): Payer: Medicare Other | Admitting: Internal Medicine

## 2014-02-26 ENCOUNTER — Encounter: Payer: Self-pay | Admitting: *Deleted

## 2014-02-26 ENCOUNTER — Encounter: Payer: Self-pay | Admitting: Internal Medicine

## 2014-02-26 VITALS — BP 140/72 | HR 61 | Ht 68.0 in | Wt 141.6 lb

## 2014-02-26 DIAGNOSIS — I4729 Other ventricular tachycardia: Secondary | ICD-10-CM

## 2014-02-26 DIAGNOSIS — I428 Other cardiomyopathies: Secondary | ICD-10-CM

## 2014-02-26 DIAGNOSIS — I472 Ventricular tachycardia, unspecified: Secondary | ICD-10-CM

## 2014-02-26 DIAGNOSIS — F17200 Nicotine dependence, unspecified, uncomplicated: Secondary | ICD-10-CM

## 2014-02-26 DIAGNOSIS — I5022 Chronic systolic (congestive) heart failure: Secondary | ICD-10-CM

## 2014-02-26 NOTE — Patient Instructions (Addendum)
Generator change   See instruction sheet  Your physician recommends that you schedule a follow-up appointment 7-10 days after 03/06/14 for wound check in the device clinic  Your physician recommends that you return for lab work today  Your physician has requested that you have an echocardiogram. Echocardiography is a painless test that uses sound waves to create images of your heart. It provides your doctor with information about the size and shape of your heart and how well your heart's chambers and valves are working. This procedure takes approximately one hour. There are no restrictions for this procedure.

## 2014-02-26 NOTE — Progress Notes (Signed)
PCP:  Redge Gainer, MD Primary Cardiologist:  Dr Percival Spanish  The patient presents today for routine electrophysiology followup.  Since last being seen in our clinic, the patient reports doing very well.  He remains active.  Today, he denies symptoms of palpitations, chest pain, shortness of breath, orthopnea, PND, lower extremity edema, dizziness, presyncope, syncope, or neurologic sequela.  Unfortunately, he continues to smoke.  He is not ready to quit.  The patient feels that he is tolerating medications without difficulties and is otherwise without complaint today.   Past Medical History  Diagnosis Date  . Nonischemic cardiomyopathy     EF was 20% (60% last echo 2012)  . Tobacco abuse   . Carotid artery disease   . EtOH dependence   . Peptic ulcer disease   . Dyslipidemia   . CAD (coronary artery disease)     Nonobstructive cath 2003  . Hypertension   . Hyperlipidemia   . Insomnia   . Depression   . Pacemaker     and defibrillator  . Arthritis   . COPD (chronic obstructive pulmonary disease)    Past Surgical History  Procedure Laterality Date  . Ulcer surgery    . Biventricular defibrillator implantation      [employer]  . Coronary stent placement      Current Outpatient Prescriptions  Medication Sig Dispense Refill  . ALPRAZolam (XANAX) 1 MG tablet Take 1 mg by mouth 2 (two) times daily as needed for anxiety.      Marland Kitchen amLODipine (NORVASC) 5 MG tablet TAKE ONE TABLET BY MOUTH ONCE DAILY  90 tablet  0  . aspirin 81 MG tablet Take 81 mg by mouth daily.      . cetirizine (ZYRTEC) 10 MG tablet Take 1 tablet (10 mg total) by mouth daily. For allergies      . metoprolol (LOPRESSOR) 100 MG tablet TAKE ONE TABLET BY MOUTH TWICE DAILY FOR HIGH BLOOD PRESSURE CONTROL  180 tablet  0  . omeprazole (PRILOSEC) 20 MG capsule TAKE ONE CAPSULE BY MOUTH TWICE DAILY FOR  ACID  REFLUX  180 capsule  1  . pravastatin (PRAVACHOL) 40 MG tablet TAKE ONE TABLET BY MOUTH AT BEDTIME  90 tablet  3   . lisinopril (PRINIVIL,ZESTRIL) 40 MG tablet Take 1 tablet (40 mg total) by mouth daily. For high blood pressure control  90 tablet  3   No current facility-administered medications for this visit.    Allergies  Allergen Reactions  . Sudafed [Pseudoephedrine Hcl] Other (See Comments)    Unknown  . Avelox [Moxifloxacin Hcl In Nacl]     History   Social History  . Marital Status: Divorced    Spouse Name: N/A    Number of Children: N/A  . Years of Education: N/A   Occupational History  . Not on file.   Social History Main Topics  . Smoking status: Light Tobacco Smoker -- 0.25 packs/day for 45 years    Types: Cigarettes  . Smokeless tobacco: Former Systems developer    Types: [employer] date: 06/20/1989     Comment: he is not interested in cessation  . Alcohol Use: No     Comment: drings 4-6 beers per day  . Drug Use: No     Comment: stopped using marijuana  . Sexual Activity: Not on file   Other Topics Concern  . Not on file   Social History Narrative  . No narrative on file    Physical Exam: Danley Danker  Vitals:   02/26/14 1201  BP: 140/72  Pulse: 61  Height: 5\' 8"  (1.727 m)  Weight: 141 lb 9.6 oz (64.229 kg)    GEN- The patient is well appearing, alert and oriented x 3 today.   Head- normocephalic, atraumatic Eyes-  Sclera clear, conjunctiva pink Ears- hearing intact Oropharynx- clear Neck- supple,  Lungs- Clear to ausculation bilaterally, normal work of breathing Chest- ICD pocket is well healed Heart- Regular rate and rhythm, no murmurs, rubs or gallops, PMI not laterally displaced GI- soft, NT, ND, + BS Extremities- no clubbing, cyanosis, or edema  ICD interrogation- reviewed in detail today,  See PACEART report  Assessment and Plan:  1. Nonischemic CM- responded to CRT with normalization of his EF Today we discussed options of CRT-P vs CRT-D.  He is clear that he would prefer to keep a CRT-D device. I will obtain an echo at this time. He has an LV-1 LV lead  and therefore will have to keep a [employer] device at time of gen change. Risk, benefits, and alteratives to this procedure were discussed at length with the patient today who wishes to proceed. No recent episodes of NSVT  2. Hypertensive cardiovascular disease Stable No change required today  3. Tobacco Cessation is advised He is not ready to quit

## 2014-02-26 NOTE — Telephone Encounter (Signed)
Spoke with the patient and let him know he is getting an echo and pre-op labs and to still go tomorrow

## 2014-02-26 NOTE — Telephone Encounter (Signed)
New message     Pt said he is due to have an xray and lab work soon but he says he had this at [facility] about 1wk ago.  Will he still need to have it----can we get the results?

## 2014-02-27 ENCOUNTER — Encounter: Payer: Self-pay | Admitting: Internal Medicine

## 2014-02-27 ENCOUNTER — Other Ambulatory Visit: Payer: Self-pay

## 2014-02-27 ENCOUNTER — Other Ambulatory Visit (INDEPENDENT_AMBULATORY_CARE_PROVIDER_SITE_OTHER): Payer: Medicare Other

## 2014-02-27 DIAGNOSIS — I428 Other cardiomyopathies: Secondary | ICD-10-CM

## 2014-02-27 DIAGNOSIS — I5022 Chronic systolic (congestive) heart failure: Secondary | ICD-10-CM

## 2014-02-28 ENCOUNTER — Encounter (HOSPITAL_COMMUNITY): Payer: Self-pay | Admitting: Pharmacy Technician

## 2014-02-28 LAB — MDC_IDC_ENUM_SESS_TYPE_INCLINIC
Brady Statistic RV Percent Paced: 100 %
HighPow Impedance: 47 Ohm
HighPow Impedance: 48 Ohm
Lead Channel Impedance Value: 615 Ohm
Lead Channel Impedance Value: 708 Ohm
Lead Channel Pacing Threshold Amplitude: 0.4 V
Lead Channel Pacing Threshold Amplitude: 0.4 V
Lead Channel Pacing Threshold Amplitude: 0.8 V
Lead Channel Pacing Threshold Amplitude: 1 V
Lead Channel Pacing Threshold Amplitude: 1 V
Lead Channel Pacing Threshold Amplitude: 1.4 V
Lead Channel Pacing Threshold Amplitude: 1.4 V
Lead Channel Pacing Threshold Amplitude: 1.6 V
Lead Channel Pacing Threshold Pulse Width: 0.4 ms
Lead Channel Pacing Threshold Pulse Width: 0.4 ms
Lead Channel Pacing Threshold Pulse Width: 0.4 ms
Lead Channel Pacing Threshold Pulse Width: 0.4 ms
Lead Channel Pacing Threshold Pulse Width: 0.4 ms
Lead Channel Pacing Threshold Pulse Width: 0.8 ms
Lead Channel Pacing Threshold Pulse Width: 0.8 ms
Lead Channel Pacing Threshold Pulse Width: 0.8 ms
Lead Channel Sensing Intrinsic Amplitude: 21.6 mV
Lead Channel Setting Pacing Amplitude: 2.4 V
Lead Channel Setting Pacing Pulse Width: 0.4 ms
MDC IDC MSMT BATTERY VOLTAGE: 2.6 V
MDC IDC MSMT LEADCHNL LV IMPEDANCE VALUE: 798 Ohm
MDC IDC MSMT LEADCHNL LV PACING THRESHOLD AMPLITUDE: 1.4 V
MDC IDC MSMT LEADCHNL LV PACING THRESHOLD PULSEWIDTH: 0.8 ms
MDC IDC MSMT LEADCHNL LV SENSING INTR AMPL: 20.3 mV
MDC IDC MSMT LEADCHNL RA PACING THRESHOLD AMPLITUDE: 0.8 V
MDC IDC MSMT LEADCHNL RA PACING THRESHOLD PULSEWIDTH: 0.4 ms
MDC IDC MSMT LEADCHNL RA SENSING INTR AMPL: 3.5 mV
MDC IDC MSMT LEADCHNL RV PACING THRESHOLD AMPLITUDE: 0.6 V
MDC IDC MSMT LEADCHNL RV PACING THRESHOLD AMPLITUDE: 0.6 V
MDC IDC MSMT LEADCHNL RV PACING THRESHOLD PULSEWIDTH: 0.4 ms
MDC IDC MSMT LEADCHNL RV PACING THRESHOLD PULSEWIDTH: 0.4 ms
MDC IDC PG SERIAL: 561795
MDC IDC SESS DTM: 20150909040000
MDC IDC SET LEADCHNL LV PACING AMPLITUDE: 2.6 V
MDC IDC SET LEADCHNL LV PACING PULSEWIDTH: 0.8 ms
MDC IDC SET LEADCHNL RA PACING AMPLITUDE: 2 V
MDC IDC STAT BRADY RA PERCENT PACED: 0 %
Zone Setting Detection Interval: 285 ms
Zone Setting Detection Interval: 363 ms

## 2014-03-05 ENCOUNTER — Ambulatory Visit: Payer: Medicare Other | Admitting: Family Medicine

## 2014-03-05 DIAGNOSIS — I428 Other cardiomyopathies: Secondary | ICD-10-CM | POA: Diagnosis not present

## 2014-03-05 DIAGNOSIS — I509 Heart failure, unspecified: Secondary | ICD-10-CM | POA: Diagnosis not present

## 2014-03-05 DIAGNOSIS — Z4502 Encounter for adjustment and management of automatic implantable cardiac defibrillator: Secondary | ICD-10-CM | POA: Diagnosis present

## 2014-03-05 DIAGNOSIS — I110 Hypertensive heart disease with heart failure: Secondary | ICD-10-CM | POA: Diagnosis not present

## 2014-03-05 DIAGNOSIS — F17200 Nicotine dependence, unspecified, uncomplicated: Secondary | ICD-10-CM | POA: Diagnosis not present

## 2014-03-05 DIAGNOSIS — R6889 Other general symptoms and signs: Secondary | ICD-10-CM | POA: Diagnosis not present

## 2014-03-05 DIAGNOSIS — F1020 Alcohol dependence, uncomplicated: Secondary | ICD-10-CM | POA: Diagnosis not present

## 2014-03-05 DIAGNOSIS — F3289 Other specified depressive episodes: Secondary | ICD-10-CM | POA: Diagnosis not present

## 2014-03-05 DIAGNOSIS — Z7982 Long term (current) use of aspirin: Secondary | ICD-10-CM | POA: Diagnosis not present

## 2014-03-05 DIAGNOSIS — M129 Arthropathy, unspecified: Secondary | ICD-10-CM | POA: Diagnosis not present

## 2014-03-05 DIAGNOSIS — E785 Hyperlipidemia, unspecified: Secondary | ICD-10-CM | POA: Diagnosis not present

## 2014-03-05 DIAGNOSIS — F329 Major depressive disorder, single episode, unspecified: Secondary | ICD-10-CM | POA: Diagnosis not present

## 2014-03-05 DIAGNOSIS — J449 Chronic obstructive pulmonary disease, unspecified: Secondary | ICD-10-CM | POA: Diagnosis not present

## 2014-03-05 DIAGNOSIS — I2510 Atherosclerotic heart disease of native coronary artery without angina pectoris: Secondary | ICD-10-CM | POA: Diagnosis not present

## 2014-03-05 DIAGNOSIS — G4700 Insomnia, unspecified: Secondary | ICD-10-CM | POA: Diagnosis not present

## 2014-03-05 MED ORDER — CHLORHEXIDINE GLUCONATE 4 % EX LIQD
60.0000 mL | Freq: Once | CUTANEOUS | Status: DC
  Filled 2014-03-05: qty 60

## 2014-03-05 MED ORDER — SODIUM CHLORIDE 0.9 % IR SOLN
80.0000 mg | Status: DC
  Filled 2014-03-05: qty 2

## 2014-03-05 MED ORDER — CEFAZOLIN SODIUM-DEXTROSE 2-3 GM-% IV SOLR: 2.0000 g | INTRAVENOUS | Status: DC

## 2014-03-06 ENCOUNTER — Ambulatory Visit (HOSPITAL_COMMUNITY)
Admission: RE | Admit: 2014-03-06 | Discharge: 2014-03-06 | Disposition: A | Discharge status: Home / Self Care | Payer: Medicare Other | Source: Ambulatory Visit | Attending: Internal Medicine | Admitting: Internal Medicine

## 2014-03-06 ENCOUNTER — Encounter (HOSPITAL_COMMUNITY)
Admission: RE | Disposition: A | Discharge status: Home / Self Care | Payer: Self-pay | Source: Ambulatory Visit | Attending: Internal Medicine

## 2014-03-06 DIAGNOSIS — I472 Ventricular tachycardia, unspecified: Secondary | ICD-10-CM

## 2014-03-06 DIAGNOSIS — J449 Chronic obstructive pulmonary disease, unspecified: Secondary | ICD-10-CM | POA: Insufficient documentation

## 2014-03-06 DIAGNOSIS — Z4502 Encounter for adjustment and management of automatic implantable cardiac defibrillator: Secondary | ICD-10-CM | POA: Diagnosis not present

## 2014-03-06 DIAGNOSIS — I5022 Chronic systolic (congestive) heart failure: Secondary | ICD-10-CM | POA: Diagnosis present

## 2014-03-06 DIAGNOSIS — I2510 Atherosclerotic heart disease of native coronary artery without angina pectoris: Secondary | ICD-10-CM | POA: Insufficient documentation

## 2014-03-06 DIAGNOSIS — F17200 Nicotine dependence, unspecified, uncomplicated: Secondary | ICD-10-CM | POA: Insufficient documentation

## 2014-03-06 DIAGNOSIS — I428 Other cardiomyopathies: Secondary | ICD-10-CM | POA: Diagnosis present

## 2014-03-06 DIAGNOSIS — I4729 Other ventricular tachycardia: Secondary | ICD-10-CM

## 2014-03-06 DIAGNOSIS — Z7982 Long term (current) use of aspirin: Secondary | ICD-10-CM | POA: Insufficient documentation

## 2014-03-06 DIAGNOSIS — I110 Hypertensive heart disease with heart failure: Secondary | ICD-10-CM | POA: Insufficient documentation

## 2014-03-06 DIAGNOSIS — I509 Heart failure, unspecified: Secondary | ICD-10-CM | POA: Insufficient documentation

## 2014-03-06 DIAGNOSIS — M129 Arthropathy, unspecified: Secondary | ICD-10-CM | POA: Insufficient documentation

## 2014-03-06 DIAGNOSIS — F3289 Other specified depressive episodes: Secondary | ICD-10-CM | POA: Insufficient documentation

## 2014-03-06 DIAGNOSIS — J4489 Other specified chronic obstructive pulmonary disease: Secondary | ICD-10-CM | POA: Insufficient documentation

## 2014-03-06 DIAGNOSIS — G4700 Insomnia, unspecified: Secondary | ICD-10-CM | POA: Insufficient documentation

## 2014-03-06 DIAGNOSIS — F1020 Alcohol dependence, uncomplicated: Secondary | ICD-10-CM | POA: Insufficient documentation

## 2014-03-06 DIAGNOSIS — F329 Major depressive disorder, single episode, unspecified: Secondary | ICD-10-CM | POA: Insufficient documentation

## 2014-03-06 DIAGNOSIS — R6889 Other general symptoms and signs: Secondary | ICD-10-CM | POA: Insufficient documentation

## 2014-03-06 DIAGNOSIS — E785 Hyperlipidemia, unspecified: Secondary | ICD-10-CM | POA: Insufficient documentation

## 2014-03-06 HISTORY — PX: BIV ICD GENERTAOR CHANGE OUT: SHX5745

## 2014-03-06 LAB — CBC
HCT: 36.2 % — ABNORMAL LOW (ref 39.0–52.0)
HEMOGLOBIN: 12.6 g/dL — AB (ref 13.0–17.0)
MCH: 32.8 pg (ref 26.0–34.0)
MCHC: 34.8 g/dL (ref 30.0–36.0)
MCV: 94.3 fL (ref 78.0–100.0)
Platelets: 284 10*3/uL (ref 150–400)
RBC: 3.84 MIL/uL — AB (ref 4.22–5.81)
RDW: 12.5 % (ref 11.5–15.5)
WBC: 5.7 10*3/uL (ref 4.0–10.5)

## 2014-03-06 LAB — BASIC METABOLIC PANEL
Anion gap: 11 (ref 5–15)
BUN: 12 mg/dL (ref 6–23)
CALCIUM: 9.6 mg/dL (ref 8.4–10.5)
CO2: 27 meq/L (ref 19–32)
Chloride: 95 mEq/L — ABNORMAL LOW (ref 96–112)
Creatinine, Ser: 0.93 mg/dL (ref 0.50–1.35)
GFR calc Af Amer: >90 mL/min (ref >90)
GFR calc non Af Amer: 83 mL/min — ABNORMAL LOW (ref >90)
GLUCOSE: 89 mg/dL (ref 70–99)
Potassium: 5.1 mEq/L (ref 3.7–5.3)
SODIUM: 133 meq/L — AB (ref 137–147)

## 2014-03-06 LAB — SURGICAL PCR SCREEN
MRSA, PCR: NEGATIVE
Staphylococcus aureus: NEGATIVE

## 2014-03-06 SURGERY — BIV ICD GENERTAOR CHANGE OUT
Anesthesia: LOCAL

## 2014-03-06 MED ORDER — SODIUM CHLORIDE 0.9 % IV SOLN
INTRAVENOUS | Status: DC
  Administered 2014-03-06: 07:00:00 via INTRAVENOUS

## 2014-03-06 MED ORDER — SODIUM CHLORIDE 0.9 % IV SOLN: 250.0000 mL | INTRAVENOUS | Status: DC | PRN

## 2014-03-06 MED ORDER — MIDAZOLAM HCL 5 MG/5ML IJ SOLN
INTRAMUSCULAR | Status: AC
  Filled 2014-03-06: qty 5

## 2014-03-06 MED ORDER — LIDOCAINE HCL (PF) 1 % IJ SOLN
INTRAMUSCULAR | Status: AC
  Filled 2014-03-06: qty 30

## 2014-03-06 MED ORDER — ONDANSETRON HCL 4 MG/2ML IJ SOLN: 4.0000 mg | Freq: Four times a day (QID) | INTRAMUSCULAR | Status: DC | PRN

## 2014-03-06 MED ORDER — LIDOCAINE HCL (PF) 1 % IJ SOLN
INTRAMUSCULAR | Status: AC
  Filled 2014-03-06: qty 60

## 2014-03-06 MED ORDER — SODIUM CHLORIDE 0.9 % IJ SOLN: 3.0000 mL | INTRAMUSCULAR | Status: DC | PRN

## 2014-03-06 MED ORDER — CEFAZOLIN SODIUM-DEXTROSE 2-3 GM-% IV SOLR
INTRAVENOUS | Status: AC
  Filled 2014-03-06: qty 50

## 2014-03-06 MED ORDER — FENTANYL CITRATE 0.05 MG/ML IJ SOLN
INTRAMUSCULAR | Status: AC
  Filled 2014-03-06: qty 2

## 2014-03-06 MED ORDER — SODIUM CHLORIDE 0.9 % IJ SOLN: 3.0000 mL | Freq: Two times a day (BID) | INTRAMUSCULAR | Status: DC

## 2014-03-06 MED ORDER — ACETAMINOPHEN 325 MG PO TABS
325.0000 mg | ORAL_TABLET | ORAL | Status: DC | PRN
  Filled 2014-03-06: qty 2

## 2014-03-06 MED ORDER — HYDROCODONE-ACETAMINOPHEN 5-325 MG PO TABS: 1.0000 | ORAL_TABLET | ORAL | Status: DC | PRN

## 2014-03-06 MED ORDER — MUPIROCIN 2 % EX OINT
TOPICAL_OINTMENT | Freq: Two times a day (BID) | CUTANEOUS | Status: DC
  Administered 2014-03-06: 1 via NASAL
  Filled 2014-03-06: qty 22

## 2014-03-06 MED ORDER — MUPIROCIN 2 % EX OINT
TOPICAL_OINTMENT | CUTANEOUS | Status: AC
  Filled 2014-03-06: qty 22

## 2014-03-06 NOTE — Interval H&P Note (Signed)
ICD Criteria  Current LVEF:60% ;Obtained < 1 month ago.  NYHA Functional Classification: Class I  Heart Failure History:  Yes, Duration of heart failure since onset is > 9 months  Non-Ischemic Dilated Cardiomyopathy History:  Yes, timeframe is > 9 months  Atrial Fibrillation/Atrial Flutter:  No.  Ventricular Tachycardia History:  Yes, Hemodynamic status unknown. VT Type:  NSVT.  Cardiac Arrest History:  No  History of Syndromes with Risk of Sudden Death:  No.  Previous ICD:  Yes, ICD Type:  CRT-D, Reason for ICD:  Primary prevention.  10%  Electrophysiology Study: No.  Prior MI: No.  PPM: No.  OSA:  No  Patient Life Expectancy of >=1 year: Yes.  Anticoagulation Therapy:  Patient is NOT on anticoagulation therapy.   Beta Blocker Therapy:  Yes.   Ace Inhibitor/ARB Therapy:  Yes.History and Physical Interval Note:  03/06/2014 7:49 AM  [name]  has presented today for surgery, with the diagnosis of EOL  The various methods of treatment have been discussed with the patient and family. After consideration of risks, benefits and other options for treatment, the patient has consented to  Procedure(s): BIV ICD [place] (N/A) as a surgical intervention .  The patient's history has been reviewed, patient examined, no change in status, stable for surgery.  I have reviewed the patient's chart and labs.  Questions were answered to the patient's satisfaction.     [name]

## 2014-03-06 NOTE — Op Note (Signed)
SURGEON:  [name], MD      PREPROCEDURE DIAGNOSES:   1. Nonischemic cardiomyopathy.   2. New York Heart Association class II, heart failure chronically.   3. LBBB  4. Sustained ventricular tachycardia with prior ICD therapy received  5. ICD at [facility] battery status       POSTPROCEDURE DIAGNOSES:   1. Nonischemic cardiomyopathy.   2. New York Heart Association class II, heart failure chronically.   3. LBBB  4. Sustained ventricular tachycardia with prior ICD therapy received  5. ICD at [facility] battery status    PROCEDURES:    1.  ICD pulse generator replacement   2. Skin pocket revision     INTRODUCTION:  [name] is a 73 y.o. male an ischemic CM (EF 60%--> previously 10% prior to CRT), NYHA Class II CHF, prior VT with appropriate ICD therapy at 171 bpm, and prior BiVICD implantation who presents today for ICD pulse generator replacement for ERI battery status.  He has a Guidant LV 1 lead and therefore a [employer] device is required.     DESCRIPTION OF PROCEDURE:  Informed written consent was obtained and the patient was brought to the electrophysiology lab in the fasting state.  The patient required no sedation for the procedure today.  The patient's left chest was prepped and draped in the usual sterile fashion by the EP lab staff.  The skin overlying the left deltopectoral region was infiltrated with lidocaine for local analgesia.  A 5-cm incision was made over the existing ICD pocket.  Electrocautery was used to assure hemostasis.  The device was exposed and removed from the pocket. A single silk stitch was identified and removed which previously secured the device within the pocket. The device was disconnected from the leads.  The leads were examined thoroughly and their integrity confirmed to be intact.   The right atrial lead was confirmed to be a Guidant model [id number]  (serial # [id number]) lead implanted 08/30/2002.  The right ventricular lead was confirmed to be a Guidant  model 0158 (serial number [id number]) right ventricular defibrillator lead implanted on the same date as the atrial lead. The left ventricular lead was confirmed to be a Guidant model [id number]  (serial number [id number]) left ventricular lead  implanted on the same date as the atrial lead..   Atrial lead P-waves measured 4.3 mV with an impedance of 619 ohms and a threshold of 0.9 volts at 0.4 milliseconds.  The right ventricular lead R-wave measured 25 mV with impedance of 575 ohms and a threshold of 0.6 volts at 0.4 milliseconds.  Left ventricular lead impedance was 751 Ohms with a threshold of 1.8V @0 .8 sec.  All three leads were then connected to a [employer] CRT-D model N164 (SN [id number]) BiV ICD. The pocket was revised to accomodate this new device.   The pocket was  irrigated with copious gentamicin solution.  The defibrillator was placed into the pocket.  The pocket was then closed in 2 layers with 2.0 Vicryl suture  for the subcutaneous and subcuticular layers. EBL<34ml. Steri-Strips and a  sterile dressing were then applied.   DFT testing was not performed today.  There were no early apparent complications.     CONCLUSIONS:   1. Nonischemic cardiomyopathy with chronic New York Heart Association class II heart failure, prior VT, and good response to CRT previously.   2. BiV ICD at elective replacement indicator- Though his EF has recovered, appropriate use criteria are clear that replacement  of his device with a BiV ICD is appropriate.  3. Successful BiV ICD generator change with a Camera operator CRT-D  4. No early apparent complications.   Jeneen Rinks Neeley Sedivy,MD 03/06/2014 12:15 PM

## 2014-03-06 NOTE — Interval H&P Note (Signed)
History and Physical Interval Note:  03/06/2014 7:41 AM  [name]  has presented today for surgery, with the diagnosis of EOL  The various methods of treatment have been discussed with the patient and family. After consideration of risks, benefits and other options for treatment, the patient has consented to  Procedure(s): BIV ICD [place] (N/A) as a surgical intervention .  The patient's history has been reviewed, patient examined, no change in status, stable for surgery.  I have reviewed the patient's chart and labs.  Questions were answered to the patient's satisfaction.     [name]

## 2014-03-06 NOTE — H&P (View-Only) (Signed)
PCP:  Redge Gainer, MD Primary Cardiologist:  Dr Percival Spanish  The patient presents today for routine electrophysiology followup.  Since last being seen in our clinic, the patient reports doing very well.  He remains active.  Today, he denies symptoms of palpitations, chest pain, shortness of breath, orthopnea, PND, lower extremity edema, dizziness, presyncope, syncope, or neurologic sequela.  Unfortunately, he continues to smoke.  He is not ready to quit.  The patient feels that he is tolerating medications without difficulties and is otherwise without complaint today.   Past Medical History  Diagnosis Date  . Nonischemic cardiomyopathy     EF was 20% (60% last echo 2012)  . Tobacco abuse   . Carotid artery disease   . EtOH dependence   . Peptic ulcer disease   . Dyslipidemia   . CAD (coronary artery disease)     Nonobstructive cath 2003  . Hypertension   . Hyperlipidemia   . Insomnia   . Depression   . Pacemaker     and defibrillator  . Arthritis   . COPD (chronic obstructive pulmonary disease)    Past Surgical History  Procedure Laterality Date  . Ulcer surgery    . Biventricular defibrillator implantation      [employer]  . Coronary stent placement      Current Outpatient Prescriptions  Medication Sig Dispense Refill  . ALPRAZolam (XANAX) 1 MG tablet Take 1 mg by mouth 2 (two) times daily as needed for anxiety.      Marland Kitchen amLODipine (NORVASC) 5 MG tablet TAKE ONE TABLET BY MOUTH ONCE DAILY  90 tablet  0  . aspirin 81 MG tablet Take 81 mg by mouth daily.      . cetirizine (ZYRTEC) 10 MG tablet Take 1 tablet (10 mg total) by mouth daily. For allergies      . metoprolol (LOPRESSOR) 100 MG tablet TAKE ONE TABLET BY MOUTH TWICE DAILY FOR HIGH BLOOD PRESSURE CONTROL  180 tablet  0  . omeprazole (PRILOSEC) 20 MG capsule TAKE ONE CAPSULE BY MOUTH TWICE DAILY FOR  ACID  REFLUX  180 capsule  1  . pravastatin (PRAVACHOL) 40 MG tablet TAKE ONE TABLET BY MOUTH AT BEDTIME  90 tablet  3   . lisinopril (PRINIVIL,ZESTRIL) 40 MG tablet Take 1 tablet (40 mg total) by mouth daily. For high blood pressure control  90 tablet  3   No current facility-administered medications for this visit.    Allergies  Allergen Reactions  . Sudafed [Pseudoephedrine Hcl] Other (See Comments)    Unknown  . Avelox [Moxifloxacin Hcl In Nacl]     History   Social History  . Marital Status: Divorced    Spouse Name: N/A    Number of Children: N/A  . Years of Education: N/A   Occupational History  . Not on file.   Social History Main Topics  . Smoking status: Light Tobacco Smoker -- 0.25 packs/day for 45 years    Types: Cigarettes  . Smokeless tobacco: Former Systems developer    Types: [place] date: 06/20/1989     Comment: he is not interested in cessation  . Alcohol Use: No     Comment: drings 4-6 beers per day  . Drug Use: No     Comment: stopped using marijuana  . Sexual Activity: Not on file   Other Topics Concern  . Not on file   Social History Narrative  . No narrative on file    Physical Exam: Danley Danker  Vitals:   02/26/14 1201  BP: 140/72  Pulse: 61  Height: 5\' 8"  (1.727 m)  Weight: 141 lb 9.6 oz (64.229 kg)    GEN- The patient is well appearing, alert and oriented x 3 today.   Head- normocephalic, atraumatic Eyes-  Sclera clear, conjunctiva pink Ears- hearing intact Oropharynx- clear Neck- supple,  Lungs- Clear to ausculation bilaterally, normal work of breathing Chest- ICD pocket is well healed Heart- Regular rate and rhythm, no murmurs, rubs or gallops, PMI not laterally displaced GI- soft, NT, ND, + BS Extremities- no clubbing, cyanosis, or edema  ICD interrogation- reviewed in detail today,  See PACEART report  Assessment and Plan:  1. Nonischemic CM- responded to CRT with normalization of his EF Today we discussed options of CRT-P vs CRT-D.  He is clear that he would prefer to keep a CRT-D device. I will obtain an echo at this time. He has an LV-1 LV lead  and therefore will have to keep a [employer] device at time of gen change. Risk, benefits, and alteratives to this procedure were discussed at length with the patient today who wishes to proceed. No recent episodes of NSVT  2. Hypertensive cardiovascular disease Stable No change required today  3. Tobacco Cessation is advised He is not ready to quit

## 2014-03-06 NOTE — Discharge Instructions (Signed)
Pacemaker Battery Change, Care After °Refer to this sheet in the next few weeks. These instructions provide you with information on caring for yourself after your procedure. Your health care provider may also give you more specific instructions. Your treatment has been planned according to current medical practices, but problems sometimes occur. Call your health care provider if you have any problems or questions after your procedure. °WHAT TO EXPECT AFTER THE PROCEDURE °After your procedure, it is typical to have the following sensations: °· Soreness at the pacemaker site. °HOME CARE INSTRUCTIONS  °· Keep the incision clean and dry. °· Unless advised otherwise, you may shower beginning 48 hours after your procedure. °· For the first week after the replacement, avoid stretching motions that pull at the incision site, and avoid heavy exercise with the arm that is on the same side as the incision. °· Take medicines only as directed by your health care provider. °· Keep all follow-up visits as directed by your health care provider. °SEEK MEDICAL CARE IF:  °· You have pain at the incision site that is not relieved by over-the-counter or prescription medicine. °· There is drainage or pus from the incision site. °· There is swelling larger than a lime at the incision site. °· You develop red streaking that extends above or below the incision site. °· You feel brief, intermittent palpitations, light-headedness, or any symptoms that you feel might be related to your heart. °SEEK IMMEDIATE MEDICAL CARE IF:  °· You experience chest pain that is different than the pain at the pacemaker site. °· You experience shortness of breath. °· You have palpitations or irregular heartbeat. °· You have light-headedness that does not go away quickly. °· You faint. °· You have pain that gets worse and is not relieved by medicine. °Document Released: 03/27/2013 Document Revised: 10/21/2013 Document Reviewed: 03/27/2013 °ExitCare® Patient  Information ©2015 [employer]. This information is not intended to replace advice given to you by your health care provider. Make sure you discuss any questions you have with your health care provider. ° °

## 2014-03-07 ENCOUNTER — Encounter: Payer: Self-pay | Admitting: Internal Medicine

## 2014-03-13 ENCOUNTER — Other Ambulatory Visit: Payer: Self-pay | Admitting: Family Medicine

## 2014-03-19 ENCOUNTER — Other Ambulatory Visit: Payer: Self-pay | Admitting: Internal Medicine

## 2014-03-19 ENCOUNTER — Ambulatory Visit (INDEPENDENT_AMBULATORY_CARE_PROVIDER_SITE_OTHER): Payer: Medicare Other | Admitting: *Deleted

## 2014-03-19 DIAGNOSIS — I428 Other cardiomyopathies: Secondary | ICD-10-CM

## 2014-03-19 DIAGNOSIS — I5022 Chronic systolic (congestive) heart failure: Secondary | ICD-10-CM

## 2014-03-19 LAB — MDC_IDC_ENUM_SESS_TYPE_INCLINIC
Battery Remaining Longevity: 96 mo
Brady Statistic RA Percent Paced: 1 %
HighPow Impedance: 64 Ohm
Lead Channel Pacing Threshold Amplitude: 0.7 V
Lead Channel Pacing Threshold Amplitude: 1 V
Lead Channel Pacing Threshold Amplitude: 1.8 V
Lead Channel Pacing Threshold Pulse Width: 0.4 ms
Lead Channel Pacing Threshold Pulse Width: 0.8 ms
Lead Channel Sensing Intrinsic Amplitude: 24.4 mV
Lead Channel Setting Pacing Amplitude: 2 V
Lead Channel Setting Pacing Amplitude: 2.5 V
Lead Channel Setting Pacing Pulse Width: 0.8 ms
Lead Channel Setting Sensing Sensitivity: 0.6 mV
Lead Channel Setting Sensing Sensitivity: 1 mV
MDC IDC MSMT LEADCHNL LV IMPEDANCE VALUE: 713 Ohm
MDC IDC MSMT LEADCHNL RA IMPEDANCE VALUE: 634 Ohm
MDC IDC MSMT LEADCHNL RA PACING THRESHOLD PULSEWIDTH: 0.4 ms
MDC IDC MSMT LEADCHNL RA SENSING INTR AMPL: 3.3 mV
MDC IDC MSMT LEADCHNL RV IMPEDANCE VALUE: 614 Ohm
MDC IDC MSMT LEADCHNL RV SENSING INTR AMPL: 25 mV — AB
MDC IDC PG SERIAL: 106298
MDC IDC SESS DTM: 20150930040000
MDC IDC SET LEADCHNL LV PACING AMPLITUDE: 2.5 V
MDC IDC SET LEADCHNL RV PACING PULSEWIDTH: 0.4 ms
MDC IDC SET ZONE DETECTION INTERVAL: 286 ms
MDC IDC SET ZONE DETECTION INTERVAL: 364 ms
MDC IDC STAT BRADY RV PERCENT PACED: 100 %

## 2014-03-19 NOTE — Progress Notes (Signed)

## 2014-03-25 ENCOUNTER — Other Ambulatory Visit: Payer: Self-pay | Admitting: Family Medicine

## 2014-03-25 ENCOUNTER — Encounter: Payer: Self-pay | Admitting: Internal Medicine

## 2014-04-08 ENCOUNTER — Other Ambulatory Visit: Payer: Self-pay | Admitting: Family Medicine

## 2014-05-29 ENCOUNTER — Encounter (HOSPITAL_COMMUNITY): Payer: Self-pay | Admitting: Internal Medicine

## 2014-05-30 ENCOUNTER — Telehealth: Payer: Self-pay | Admitting: Family Medicine

## 2014-05-30 ENCOUNTER — Other Ambulatory Visit: Payer: Self-pay | Admitting: Family Medicine

## 2014-05-30 NOTE — Telephone Encounter (Signed)
Please review and advise.

## 2014-05-30 NOTE — Telephone Encounter (Signed)
Last seen and filled on 01/30/14. If approved, call into [facility]

## 2014-06-02 ENCOUNTER — Other Ambulatory Visit: Payer: Self-pay | Admitting: Family Medicine

## 2014-06-03 ENCOUNTER — Other Ambulatory Visit: Payer: Self-pay | Admitting: Family Medicine

## 2014-06-03 MED ORDER — ALPRAZOLAM 1 MG PO TABS: 1.0000 mg | ORAL_TABLET | Freq: Two times a day (BID) | ORAL | Status: DC | PRN

## 2014-06-03 NOTE — Telephone Encounter (Signed)
Rx to front for pt pick up

## 2014-06-17 ENCOUNTER — Encounter: Payer: Self-pay | Admitting: Family Medicine

## 2014-06-17 ENCOUNTER — Ambulatory Visit (INDEPENDENT_AMBULATORY_CARE_PROVIDER_SITE_OTHER): Payer: Medicare Other | Admitting: Family Medicine

## 2014-06-17 VITALS — BP 132/68 | HR 64 | Temp 97.0°F | Ht 68.0 in | Wt 141.0 lb

## 2014-06-17 DIAGNOSIS — M1990 Unspecified osteoarthritis, unspecified site: Secondary | ICD-10-CM

## 2014-06-17 DIAGNOSIS — I10 Essential (primary) hypertension: Secondary | ICD-10-CM

## 2014-06-17 DIAGNOSIS — F411 Generalized anxiety disorder: Secondary | ICD-10-CM

## 2014-06-17 MED ORDER — MELOXICAM 15 MG PO TABS: 15.0000 mg | ORAL_TABLET | Freq: Every day | ORAL | Status: DC

## 2014-06-17 MED ORDER — AMLODIPINE BESYLATE 5 MG PO TABS: 5.0000 mg | ORAL_TABLET | Freq: Every day | ORAL | Status: DC

## 2014-06-17 MED ORDER — ALPRAZOLAM 1 MG PO TABS: 1.0000 mg | ORAL_TABLET | Freq: Two times a day (BID) | ORAL | Status: DC | PRN

## 2014-06-17 MED ORDER — METOPROLOL TARTRATE 100 MG PO TABS: 100.0000 mg | ORAL_TABLET | Freq: Two times a day (BID) | ORAL | Status: DC

## 2014-06-17 NOTE — Progress Notes (Signed)
   Subjective:    Patient ID: [name], male    DOB: [birth year], 73 y.o.   MRN: [id number]  HPI Patient is here for follow up. He has had some cornea surgery right eye.  He has been having left shoulder pain.    Review of Systems  Constitutional: Negative for fever.  HENT: Negative for ear pain.   Eyes: Negative for discharge.  Respiratory: Negative for cough.   Cardiovascular: Negative for chest pain.  Gastrointestinal: Negative for abdominal distention.  Endocrine: Negative for polyuria.  Genitourinary: Negative for difficulty urinating.  Musculoskeletal: Negative for gait problem and neck pain.  Skin: Negative for color change and rash.  Neurological: Negative for speech difficulty and headaches.  Psychiatric/Behavioral: Negative for agitation.       Objective:    BP 132/68 mmHg  Pulse 64  Temp(Src) 97 F (36.1 C) (Oral)  Ht 5\' 8"  (1.727 m)  Wt 141 lb (63.957 kg)  BMI 21.44 kg/m2 Physical Exam  Constitutional: He is oriented to person, place, and time. He appears well-developed and well-nourished.  HENT:  Head: Normocephalic and atraumatic.  Mouth/Throat: Oropharynx is clear and moist.  Eyes: Pupils are equal, round, and reactive to light.  Neck: Normal range of motion. Neck supple.  Cardiovascular: Normal rate and regular rhythm.   No murmur heard. Pulmonary/Chest: Effort normal and breath sounds normal.  Abdominal: Soft. Bowel sounds are normal. There is no tenderness.  Neurological: He is alert and oriented to person, place, and time.  Skin: Skin is warm and dry.  Psychiatric: He has a normal mood and affect.          Assessment & Plan:     ICD-9-CM ICD-10-CM   1. Anxiety state 300.00 F41.1 amLODipine (NORVASC) 5 MG tablet     metoprolol (LOPRESSOR) 100 MG tablet     ALPRAZolam (XANAX) 1 MG tablet  2. Essential hypertension 401.9 I10 amLODipine (NORVASC) 5 MG tablet     metoprolol (LOPRESSOR) 100 MG tablet     ALPRAZolam (XANAX) 1 MG tablet    3. Arthritis - Meloxicam 15mg  one po qd  No Follow-up on file.  Lysbeth Penner FNP

## 2014-06-24 ENCOUNTER — Emergency Department: Payer: Self-pay | Admitting: Emergency Medicine

## 2014-06-24 DIAGNOSIS — Y904 Blood alcohol level of 80-99 mg/100 ml: Secondary | ICD-10-CM | POA: Diagnosis not present

## 2014-06-24 DIAGNOSIS — Z720 Tobacco use: Secondary | ICD-10-CM | POA: Diagnosis not present

## 2014-06-24 DIAGNOSIS — E871 Hypo-osmolality and hyponatremia: Secondary | ICD-10-CM | POA: Diagnosis not present

## 2014-06-24 DIAGNOSIS — F329 Major depressive disorder, single episode, unspecified: Secondary | ICD-10-CM | POA: Diagnosis not present

## 2014-06-24 DIAGNOSIS — F10220 Alcohol dependence with intoxication, uncomplicated: Secondary | ICD-10-CM | POA: Diagnosis not present

## 2014-06-24 DIAGNOSIS — F1010 Alcohol abuse, uncomplicated: Secondary | ICD-10-CM | POA: Diagnosis not present

## 2014-06-24 LAB — CBC
HCT: 38.3 % — ABNORMAL LOW (ref 40.0–52.0)
HGB: 12.9 g/dL — ABNORMAL LOW (ref 13.0–18.0)
MCH: 32.2 pg (ref 26.0–34.0)
MCHC: 33.6 g/dL (ref 32.0–36.0)
MCV: 96 fL (ref 80–100)
PLATELETS: 299 10*3/uL (ref 150–440)
RBC: 4 10*6/uL — AB (ref 4.40–5.90)
RDW: 12.6 % (ref 11.5–14.5)
WBC: 8.6 10*3/uL (ref 3.8–10.6)

## 2014-06-24 LAB — COMPREHENSIVE METABOLIC PANEL
ALBUMIN: 4.3 g/dL (ref 3.4–5.0)
ANION GAP: 6 — AB (ref 7–16)
Alkaline Phosphatase: 83 U/L
BILIRUBIN TOTAL: 0.6 mg/dL (ref 0.2–1.0)
BUN: 11 mg/dL (ref 7–18)
CHLORIDE: 88 mmol/L — AB (ref 98–107)
Calcium, Total: 9.2 mg/dL (ref 8.5–10.1)
Co2: 28 mmol/L (ref 21–32)
Creatinine: 1.06 mg/dL (ref 0.60–1.30)
EGFR (Non-African Amer.): >60
Glucose: 91 mg/dL (ref 65–99)
Osmolality: 245 (ref 275–301)
POTASSIUM: 4.6 mmol/L (ref 3.5–5.1)
SGOT(AST): 41 U/L — ABNORMAL HIGH (ref 15–37)
SGPT (ALT): 40 U/L
Sodium: 122 mmol/L — ABNORMAL LOW (ref 136–145)
Total Protein: 8.1 g/dL (ref 6.4–8.2)

## 2014-06-24 LAB — URINALYSIS, COMPLETE
BACTERIA: NONE SEEN
BLOOD: NEGATIVE
Bilirubin,UR: NEGATIVE
Glucose,UR: NEGATIVE mg/dL (ref 0–75)
KETONE: NEGATIVE
Leukocyte Esterase: NEGATIVE
Nitrite: NEGATIVE
Ph: 6 (ref 4.5–8.0)
Protein: NEGATIVE
RBC,UR: NONE SEEN /HPF (ref 0–5)
SPECIFIC GRAVITY: 1.002 (ref 1.003–1.030)
Squamous Epithelial: NONE SEEN
WBC UR: NONE SEEN /HPF (ref 0–5)

## 2014-06-24 LAB — DRUG SCREEN, URINE
AMPHETAMINES, UR SCREEN: NEGATIVE (ref ?–1000)
Barbiturates, Ur Screen: NEGATIVE (ref ?–200)
Benzodiazepine, Ur Scrn: POSITIVE (ref ?–200)
COCAINE METABOLITE, UR ~~LOC~~: NEGATIVE (ref ?–300)
Cannabinoid 50 Ng, Ur ~~LOC~~: POSITIVE (ref ?–50)
MDMA (Ecstasy)Ur Screen: NEGATIVE (ref ?–500)
METHADONE, UR SCREEN: NEGATIVE (ref ?–300)
OPIATE, UR SCREEN: NEGATIVE (ref ?–300)
Phencyclidine (PCP) Ur S: NEGATIVE (ref ?–25)
Tricyclic, Ur Screen: NEGATIVE (ref ?–1000)

## 2014-06-24 LAB — TSH: Thyroid Stimulating Horm: 1.66 u[IU]/mL

## 2014-06-24 LAB — ACETAMINOPHEN LEVEL: Acetaminophen: <2 ug/mL

## 2014-06-24 LAB — ETHANOL: ETHANOL LVL: 84 mg/dL

## 2014-06-24 LAB — SALICYLATE LEVEL: Salicylates, Serum: <1.7 mg/dL

## 2014-06-27 ENCOUNTER — Encounter: Payer: Self-pay | Admitting: Internal Medicine

## 2014-07-03 ENCOUNTER — Emergency Department: Payer: Self-pay | Admitting: Student

## 2014-07-03 DIAGNOSIS — Z79899 Other long term (current) drug therapy: Secondary | ICD-10-CM | POA: Diagnosis not present

## 2014-07-03 DIAGNOSIS — F1010 Alcohol abuse, uncomplicated: Secondary | ICD-10-CM | POA: Diagnosis not present

## 2014-07-03 DIAGNOSIS — Y900 Blood alcohol level of less than 20 mg/100 ml: Secondary | ICD-10-CM | POA: Diagnosis not present

## 2014-07-03 DIAGNOSIS — Z720 Tobacco use: Secondary | ICD-10-CM | POA: Diagnosis not present

## 2014-07-03 DIAGNOSIS — F10239 Alcohol dependence with withdrawal, unspecified: Secondary | ICD-10-CM | POA: Diagnosis not present

## 2014-07-03 DIAGNOSIS — I10 Essential (primary) hypertension: Secondary | ICD-10-CM | POA: Diagnosis not present

## 2014-07-03 DIAGNOSIS — Z7982 Long term (current) use of aspirin: Secondary | ICD-10-CM | POA: Diagnosis not present

## 2014-07-03 LAB — URINALYSIS, COMPLETE
BLOOD: NEGATIVE
Bacteria: NONE SEEN
Bilirubin,UR: NEGATIVE
Glucose,UR: NEGATIVE mg/dL (ref 0–75)
KETONE: NEGATIVE
Leukocyte Esterase: NEGATIVE
NITRITE: NEGATIVE
PH: 6 (ref 4.5–8.0)
Protein: NEGATIVE
RBC,UR: <1 /HPF (ref 0–5)
SPECIFIC GRAVITY: 1.009 (ref 1.003–1.030)
Squamous Epithelial: NONE SEEN

## 2014-07-03 LAB — DRUG SCREEN, URINE
Amphetamines, Ur Screen: NEGATIVE (ref ?–1000)
BARBITURATES, UR SCREEN: NEGATIVE (ref ?–200)
BENZODIAZEPINE, UR SCRN: POSITIVE (ref ?–200)
CANNABINOID 50 NG, UR ~~LOC~~: POSITIVE (ref ?–50)
Cocaine Metabolite,Ur ~~LOC~~: NEGATIVE (ref ?–300)
MDMA (Ecstasy)Ur Screen: NEGATIVE (ref ?–500)
METHADONE, UR SCREEN: NEGATIVE (ref ?–300)
OPIATE, UR SCREEN: NEGATIVE (ref ?–300)
PHENCYCLIDINE (PCP) UR S: NEGATIVE (ref ?–25)
Tricyclic, Ur Screen: NEGATIVE (ref ?–1000)

## 2014-07-03 LAB — CBC
HCT: 40.5 % (ref 40.0–52.0)
HGB: 13.1 g/dL (ref 13.0–18.0)
MCH: 31.2 pg (ref 26.0–34.0)
MCHC: 32.2 g/dL (ref 32.0–36.0)
MCV: 97 fL (ref 80–100)
Platelet: 309 10*3/uL (ref 150–440)
RBC: 4.18 10*6/uL — ABNORMAL LOW (ref 4.40–5.90)
RDW: 12.8 % (ref 11.5–14.5)
WBC: 9.4 10*3/uL (ref 3.8–10.6)

## 2014-07-03 LAB — COMPREHENSIVE METABOLIC PANEL
ALBUMIN: 4.3 g/dL (ref 3.4–5.0)
ALK PHOS: 92 U/L
ALT: 57 U/L
Anion Gap: 7 (ref 7–16)
BUN: 14 mg/dL (ref 7–18)
Bilirubin,Total: 0.7 mg/dL (ref 0.2–1.0)
CALCIUM: 9.4 mg/dL (ref 8.5–10.1)
CREATININE: 1.28 mg/dL (ref 0.60–1.30)
Chloride: 89 mmol/L — ABNORMAL LOW (ref 98–107)
Co2: 27 mmol/L (ref 21–32)
EGFR (African American): >60
EGFR (Non-African Amer.): 59 — ABNORMAL LOW
GLUCOSE: 134 mg/dL — AB (ref 65–99)
OSMOLALITY: 250 (ref 275–301)
Potassium: 5 mmol/L (ref 3.5–5.1)
SGOT(AST): 56 U/L — ABNORMAL HIGH (ref 15–37)
SODIUM: 123 mmol/L — AB (ref 136–145)
Total Protein: 8.5 g/dL — ABNORMAL HIGH (ref 6.4–8.2)

## 2014-07-03 LAB — ACETAMINOPHEN LEVEL: Acetaminophen: <2 ug/mL

## 2014-07-03 LAB — TSH: Thyroid Stimulating Horm: 1.63 u[IU]/mL

## 2014-07-03 LAB — SALICYLATE LEVEL: Salicylates, Serum: <1.7 mg/dL

## 2014-07-03 LAB — ETHANOL: Ethanol: <3 mg/dL

## 2014-07-04 ENCOUNTER — Other Ambulatory Visit: Payer: Self-pay | Admitting: Family Medicine

## 2014-07-25 ENCOUNTER — Encounter: Payer: Self-pay | Admitting: Internal Medicine

## 2014-07-25 ENCOUNTER — Ambulatory Visit (INDEPENDENT_AMBULATORY_CARE_PROVIDER_SITE_OTHER): Payer: Medicare Other | Admitting: Internal Medicine

## 2014-07-25 VITALS — BP 144/78 | HR 58 | Ht 68.0 in | Wt 137.1 lb

## 2014-07-25 DIAGNOSIS — I5022 Chronic systolic (congestive) heart failure: Secondary | ICD-10-CM

## 2014-07-25 DIAGNOSIS — F17200 Nicotine dependence, unspecified, uncomplicated: Secondary | ICD-10-CM

## 2014-07-25 DIAGNOSIS — Z720 Tobacco use: Secondary | ICD-10-CM | POA: Diagnosis not present

## 2014-07-25 DIAGNOSIS — I428 Other cardiomyopathies: Secondary | ICD-10-CM

## 2014-07-25 DIAGNOSIS — I429 Cardiomyopathy, unspecified: Secondary | ICD-10-CM

## 2014-07-25 LAB — MDC_IDC_ENUM_SESS_TYPE_INCLINIC
Date Time Interrogation Session: 20160205050000
HighPow Impedance: 67 Ohm
Lead Channel Pacing Threshold Pulse Width: 0.4 ms
Lead Channel Sensing Intrinsic Amplitude: 25 mV
Lead Channel Setting Pacing Amplitude: 2 V
Lead Channel Setting Pacing Amplitude: 2.5 V
Lead Channel Setting Pacing Amplitude: 2.8 V
Lead Channel Setting Sensing Sensitivity: 0.6 mV
MDC IDC MSMT LEADCHNL LV IMPEDANCE VALUE: 771 Ohm
MDC IDC MSMT LEADCHNL LV PACING THRESHOLD AMPLITUDE: 1.8 V
MDC IDC MSMT LEADCHNL LV PACING THRESHOLD PULSEWIDTH: 0.8 ms
MDC IDC MSMT LEADCHNL LV SENSING INTR AMPL: 24 mV
MDC IDC MSMT LEADCHNL RA IMPEDANCE VALUE: 651 Ohm
MDC IDC MSMT LEADCHNL RA PACING THRESHOLD AMPLITUDE: 0.9 V
MDC IDC MSMT LEADCHNL RA PACING THRESHOLD PULSEWIDTH: 0.4 ms
MDC IDC MSMT LEADCHNL RA SENSING INTR AMPL: 5.9 mV
MDC IDC MSMT LEADCHNL RV IMPEDANCE VALUE: 630 Ohm
MDC IDC MSMT LEADCHNL RV PACING THRESHOLD AMPLITUDE: 0.7 V
MDC IDC PG SERIAL: 106298
MDC IDC SET LEADCHNL LV PACING PULSEWIDTH: 0.8 ms
MDC IDC SET LEADCHNL LV SENSING SENSITIVITY: 1 mV
MDC IDC SET LEADCHNL RV PACING PULSEWIDTH: 0.4 ms
MDC IDC SET ZONE DETECTION INTERVAL: 286 ms
MDC IDC SET ZONE DETECTION INTERVAL: 364 ms

## 2014-07-25 NOTE — Patient Instructions (Signed)
Continue all current medications. Remote check 10-27-14 Your physician wants you to follow up in:  1 year.  You will receive a reminder letter in the mail one-two months in advance.  If you don't receive a letter, please call our office to schedule the follow up appointment - [place]

## 2014-07-25 NOTE — Progress Notes (Signed)
Electrophysiology Office Note   Date:  07/25/2014   ID:  Yarel, Rushlow 1941/06/04, MRN [id number]  PCP:  Lysbeth Penner, FNP  Cardiologist:  Dr Percival Spanish Primary Electrophysiologist: [name], MD    Chief Complaint  Patient presents with  . Shortness of Breath     History of Present Illness: [name] is a 74 y.o. male who presents today for electrophysiology evaluation.   He has done well since his recent generator change.  He denies procedure related complications.  Denies fevers or chills.   Chf is very stable. BP is controlled.  He continues to smoke and is not ready to qui.t Today, he denies symptoms of palpitations, chest pain, shortness of breath, orthopnea, PND, lower extremity edema, claudication, dizziness, presyncope, syncope, bleeding, or neurologic sequela. The patient is tolerating medications without difficulties and is otherwise without complaint today.    Past Medical History  Diagnosis Date  . Nonischemic cardiomyopathy     EF was 20% (60% last echo 2012)  . Tobacco abuse   . Carotid artery disease   . EtOH dependence   . Peptic ulcer disease   . Dyslipidemia   . CAD (coronary artery disease)     Nonobstructive cath 2003  . Hypertension   . Hyperlipidemia   . Insomnia   . Depression   . Pacemaker     and defibrillator  . Arthritis   . COPD (chronic obstructive pulmonary disease)    Past Surgical History  Procedure Laterality Date  . Ulcer surgery    . Biventricular defibrillator implantation      [employer]  . Coronary stent placement    . Biv icd genertaor change out N/A 03/06/2014    [employer] Gen change by Dr Rayann Heman Beckie Salts CRT-D with LV1 header)     Current Outpatient Prescriptions  Medication Sig Dispense Refill  . ALPRAZolam (XANAX) 1 MG tablet Take 1 tablet (1 mg total) by mouth 2 (two) times daily as needed for anxiety. 60 tablet 3  . amLODipine (NORVASC) 5 MG tablet Take 1 tablet (5 mg total) by  mouth daily. 90 tablet 3  . aspirin 81 MG tablet Take 81 mg by mouth daily.    . cetirizine (ZYRTEC) 10 MG tablet Take 10 mg by mouth daily as needed for allergies.    . meloxicam (MOBIC) 15 MG tablet Take 1 tablet (15 mg total) by mouth daily. 30 tablet 11  . metoprolol (LOPRESSOR) 100 MG tablet Take 1 tablet (100 mg total) by mouth 2 (two) times daily. For high blood pressure control 180 tablet 3  . omeprazole (PRILOSEC) 20 MG capsule Take 20 mg by mouth 2 (two) times daily as needed (for acid reflux).    . pravastatin (PRAVACHOL) 40 MG tablet Take 40 mg by mouth at bedtime.    . pravastatin (PRAVACHOL) 40 MG tablet TAKE ONE TABLET BY MOUTH ONCE DAILY AT BEDTIME 90 tablet 0   No current facility-administered medications for this visit.    Allergies:   Sudafed and Avelox   Social History:  The patient  reports that he has been smoking Cigarettes.  He has a 11.25 pack-year smoking history. He quit smokeless tobacco use about 25 years ago. His smokeless tobacco use included Chew. He reports that he does not drink alcohol or use illicit drugs.   Family History:  The patient's family history includes Colon polyps in his sister.    ROS:  Please see the history of present illness.  All other systems are reviewed and negative.    PHYSICAL EXAM: VS:  BP 144/78 mmHg  Pulse 58  Ht 5\' 8"  (1.727 m)  Wt 137 lb 1.9 oz (62.197 kg)  BMI 20.85 kg/m2 , BMI Body mass index is 20.85 kg/(m^2). GEN: Well nourished, well developed, in no acute distress HEENT: normal Neck: no JVD, carotid bruits, or masses Cardiac: RRR; no murmurs, rubs, or gallops,no edema  Respiratory:  clear to auscultation bilaterally, normal work of breathing GI: soft, nontender, nondistended, + BS MS: no deformity or atrophy Skin: warm and dry, device pocket is well healed Neuro:  Strength and sensation are intact Psych: euthymic mood, full affect  Device interrogation is reviewed today in detail.  See PaceArt for  details.   Recent Labs: 10/08/2013: TSH 1.300 01/31/2014: ALT 21 03/06/2014: BUN 12; Creatinine 0.93; Hemoglobin 12.6*; Platelets 284; Potassium 5.1; Sodium 133*    Lipid Panel     Component Value Date/Time   CHOL CANCELED 01/31/2014 1618   TRIG CANCELED 01/31/2014 1618   TRIG 95 01/31/2014 1618   HDL CANCELED 01/31/2014 1618   HDL 81 01/31/2014 1618   CHOLHDL 2.5 01/31/2014 1618   LDLCALC 99 01/31/2014 1618     Wt Readings from Last 3 Encounters:  07/25/14 137 lb 1.9 oz (62.197 kg)  06/17/14 141 lb (63.957 kg)  03/06/14 142 lb (64.411 kg)     ASSESSMENT AND PLAN:  1. Nonischemic CM- responded to CRT with normalization of his EF Doing well s/p recent gen change Normal BiV ICD function See Pace Art report No changes today  2. Hypertensive cardiovascular disease Stable No change required today  3. Tobacco Cessation is advised He is not ready to quit  Will follow device remotely with [facility]    Current medicines are reviewed at length with the patient today.   The patient does not have concerns regarding his medicines.  The following changes were made today:  none   Follow-up: return to see me in 1 year  Signed, [name], MD  07/25/2014 1:19 PM     [address] [place] [id number] [phone] (office) [phone] (fax)

## 2014-09-08 ENCOUNTER — Other Ambulatory Visit: Payer: Self-pay | Admitting: Family Medicine

## 2014-10-02 ENCOUNTER — Other Ambulatory Visit: Payer: Self-pay | Admitting: Family Medicine

## 2014-10-16 ENCOUNTER — Other Ambulatory Visit: Payer: Self-pay | Admitting: Family Medicine

## 2014-10-18 NOTE — Telephone Encounter (Signed)
Last filled 09/15/14, last seen by [facility] 06/18/15. Pt uses [facility]

## 2014-10-19 NOTE — Consult Note (Signed)
PATIENT NAME:  [name], BLASIUS MR#:  [id number] DATE OF BIRTH:  1940/12/03  DATE OF CONSULTATION:  07/03/2014  REFERRING PHYSICIAN:   CONSULTING PHYSICIAN:  Gonzella Lex, MD  IDENTIFYING INFORMATION AND REASON FOR CONSULTATION: A 74 year old man who came voluntarily to the Emergency Room.   CHIEF COMPLAINT: "I guess it's for detox."   HISTORY OF PRESENT ILLNESS: Information obtained from the patient and the chart. The patient was brought in voluntarily to the Emergency Room by his son-in-law with a request that he get "detox" from alcohol. He reports that he has been drinking a probably about 6 beers a day. This has been an increase over a prior baseline. The patient feels that it is causing him problems that he has become more socially isolated and is not pursuing enjoyable activities. Mood is a little bit down but not persistently depressed. Still does enjoy being around his family and other activities when he is active. He has some chronic difficulty with sleep. Appetite is slightly decreased. Totally denies any suicidal ideation. Denies homicidal ideation. Denies any psychotic symptoms. The patient is not abusing any other drugs and has been taking his prescription medicine for his blood pressure. He denies having any history of seizures or delirium tremens. He is living by himself. Finds the isolation somewhat stressful.   PAST PSYCHIATRIC HISTORY: No history of other psychiatric problems. No history of treatment for depression or anxiety. No psychiatric hospitalizations. No suicide attempts. He has been to detoxification treatment in the past and has also attended AA groups in the past with some success. Has been able to maintain sobriety for significant periods of time.   PAST MEDICAL HISTORY: High blood pressure. No history known of heart attacks or strokes.   FAMILY HISTORY: Positive for other people with alcohol problems.   SOCIAL HISTORY: Living by himself, but has family next door  and nearby that he enjoys spending time with.   CURRENT MEDICATIONS: He recalls metoprolol; thinks there may be another high blood pressure medicine as well. Denies taking any other current prescription medicine.   ALLERGIES: AVELOX AND SUDAFED.   REVIEW OF SYSTEMS: Denies suicidal or homicidal ideation. Denies current tremor. Denies feeling sick to his stomach. No specific medical complaints currently.   MENTAL STATUS EXAMINATION: Neatly dressed and groomed gentleman who looks older than his stated age, cooperative with the interview. Good eye contact. Normal psychomotor activity. No sign of tremor or unsteadiness on his feet. Speech is normal in rate, tone and volume. Affect is a little bit blunted, not tearful. Mood is stated as being okay. Thoughts are lucid without loosening of associations. Denies auditory or visual hallucinations. Denies any suicidal or homicidal ideation. He can repeat 3 words immediately, remembers 2 of them at 3 minutes. Alert and oriented x4. Judgment and insight appears intact and appropriate.   VITAL SIGNS: Blood pressure 137/84, respirations 14, pulse 67, temperature 97.4.   LABORATORY RESULTS: TSH normal at 1.6. Salicylates and acetaminophen negative. Alcohol level is also negative. Slightly increased AST at 56. Low sodium of 123, elevated glucose 134. CBC unremarkable. Urinalysis unremarkable. Drug screen is positive for cannabis and benzodiazepines.   ASSESSMENT: This is a 74 year old man with alcohol abuse. He was denying the use of other drugs but may be also using cannabis as well. No history of seizures, delirium tremens or other complications from withdrawal. No suicidality or evidence of acute dangerousness. The patient does not meet criteria for requiring inpatient hospitalization for detoxification.   TREATMENT PLAN:  Counseling done with the patient about substance use including motivational interviewing and helping him to identify clear reasons for sobriety  and ways that he can achieve that. Suggest that he get involved in Alcoholics Anonymous. Suggest that we give him information about local providers of substance abuse treatment. The case discussed with Emergency Room doctor. The patient can be released from the Emergency Room. No prescriptions needed.   DIAGNOSIS, PRINCIPAL AND PRIMARY:  AXIS I: Alcohol abuse, moderate.   SECONDARY DIAGNOSES: AXIS I: No further.  AXIS II: No diagnosis.  AXIS III: High blood pressure neck includes thinks    ____________________________ Gonzella Lex, MD jtc:ST D: 07/03/2014 23:55:23 ET T: 07/04/2014 00:22:41 ET JOB#: [id number]  cc: Gonzella Lex, MD, <Dictator> Gonzella Lex MD ELECTRONICALLY SIGNED 07/30/2014 17:18

## 2014-10-20 ENCOUNTER — Ambulatory Visit: Payer: Self-pay | Admitting: Family Medicine

## 2014-10-20 NOTE — Telephone Encounter (Signed)
Please call in xanax with 1 refills 

## 2014-10-20 NOTE — Telephone Encounter (Signed)
rx called into pharmacy

## 2014-10-27 ENCOUNTER — Ambulatory Visit (INDEPENDENT_AMBULATORY_CARE_PROVIDER_SITE_OTHER): Payer: Medicare Other | Admitting: *Deleted

## 2014-10-27 DIAGNOSIS — I428 Other cardiomyopathies: Secondary | ICD-10-CM

## 2014-10-27 DIAGNOSIS — I429 Cardiomyopathy, unspecified: Secondary | ICD-10-CM

## 2014-10-27 NOTE — Progress Notes (Signed)
Remote ICD transmission.   

## 2014-10-28 ENCOUNTER — Ambulatory Visit: Payer: Self-pay | Admitting: Family Medicine

## 2014-10-31 LAB — CUP PACEART REMOTE DEVICE CHECK
Battery Remaining Percentage: 100 %
Date Time Interrogation Session: 20160509043100
HIGH POWER IMPEDANCE MEASURED VALUE: 59 Ohm
Lead Channel Impedance Value: 617 Ohm
Lead Channel Impedance Value: 674 Ohm
Lead Channel Pacing Threshold Amplitude: 0.9 V
Lead Channel Pacing Threshold Pulse Width: 0.4 ms
Lead Channel Setting Pacing Amplitude: 2 V
Lead Channel Setting Pacing Amplitude: 2.8 V
Lead Channel Setting Pacing Pulse Width: 0.4 ms
Lead Channel Setting Pacing Pulse Width: 0.8 ms
Lead Channel Setting Sensing Sensitivity: 0.6 mV
Lead Channel Setting Sensing Sensitivity: 1 mV
MDC IDC MSMT BATTERY REMAINING LONGEVITY: 96 mo
MDC IDC MSMT LEADCHNL LV PACING THRESHOLD AMPLITUDE: 1.8 V
MDC IDC MSMT LEADCHNL LV PACING THRESHOLD PULSEWIDTH: 0.8 ms
MDC IDC MSMT LEADCHNL RV IMPEDANCE VALUE: 580 Ohm
MDC IDC MSMT LEADCHNL RV PACING THRESHOLD AMPLITUDE: 0.7 V
MDC IDC MSMT LEADCHNL RV PACING THRESHOLD PULSEWIDTH: 0.4 ms
MDC IDC PG SERIAL: 106298
MDC IDC SET LEADCHNL RV PACING AMPLITUDE: 2.5 V
MDC IDC STAT BRADY RA PERCENT PACED: 0 %
MDC IDC STAT BRADY RV PERCENT PACED: 100 %
Zone Setting Detection Interval: 286 ms
Zone Setting Detection Interval: 364 ms

## 2014-11-03 ENCOUNTER — Ambulatory Visit (INDEPENDENT_AMBULATORY_CARE_PROVIDER_SITE_OTHER): Payer: Medicare Other | Admitting: Family

## 2014-11-03 ENCOUNTER — Encounter: Payer: Self-pay | Admitting: Family

## 2014-11-03 VITALS — BP 172/81 | HR 59 | Temp 97.0°F | Ht 68.0 in | Wt 136.6 lb

## 2014-11-03 DIAGNOSIS — R58 Hemorrhage, not elsewhere classified: Secondary | ICD-10-CM

## 2014-11-03 DIAGNOSIS — F411 Generalized anxiety disorder: Secondary | ICD-10-CM | POA: Diagnosis not present

## 2014-11-03 DIAGNOSIS — I25119 Atherosclerotic heart disease of native coronary artery with unspecified angina pectoris: Secondary | ICD-10-CM

## 2014-11-03 DIAGNOSIS — Z720 Tobacco use: Secondary | ICD-10-CM

## 2014-11-03 DIAGNOSIS — K219 Gastro-esophageal reflux disease without esophagitis: Secondary | ICD-10-CM | POA: Insufficient documentation

## 2014-11-03 DIAGNOSIS — Z125 Encounter for screening for malignant neoplasm of prostate: Secondary | ICD-10-CM | POA: Diagnosis not present

## 2014-11-03 DIAGNOSIS — E785 Hyperlipidemia, unspecified: Secondary | ICD-10-CM

## 2014-11-03 DIAGNOSIS — J309 Allergic rhinitis, unspecified: Secondary | ICD-10-CM | POA: Diagnosis not present

## 2014-11-03 DIAGNOSIS — I10 Essential (primary) hypertension: Secondary | ICD-10-CM | POA: Insufficient documentation

## 2014-11-03 DIAGNOSIS — F17200 Nicotine dependence, unspecified, uncomplicated: Secondary | ICD-10-CM

## 2014-11-03 LAB — POCT CBC
GRANULOCYTE PERCENT: 66.9 % (ref 37–80)
HCT, POC: 40.8 % — AB (ref 43.5–53.7)
HEMOGLOBIN: 12.8 g/dL — AB (ref 14.1–18.1)
LYMPH, POC: 2.1 (ref 0.6–3.4)
MCH, POC: 30.3 pg (ref 27–31.2)
MCHC: 31.4 g/dL — AB (ref 31.8–35.4)
MCV: 96.5 fL (ref 80–97)
MPV: 7.5 fL (ref 0–99.8)
PLATELET COUNT, POC: 339 10*3/uL (ref 142–424)
POC Granulocyte: 5.6 (ref 2–6.9)
POC LYMPH PERCENT: 24.7 %L (ref 10–50)
RBC: 4.23 M/uL — AB (ref 4.69–6.13)
RDW, POC: 12.9 %
WBC: 8.4 10*3/uL (ref 4.6–10.2)

## 2014-11-03 MED ORDER — LISINOPRIL 20 MG PO TABS: 20.0000 mg | ORAL_TABLET | Freq: Every day | ORAL | Status: DC

## 2014-11-03 NOTE — Patient Instructions (Signed)

## 2014-11-03 NOTE — Progress Notes (Signed)
 Subjective:    Patient ID: [name], male    DOB: 09/03/1940, 73 y.o.   MRN: [id number]  Hypertension This is a chronic problem. The current episode started more than 1 year ago. The problem has been waxing and waning since onset. The problem is uncontrolled. Associated symptoms include anxiety. Pertinent negatives include no chest pain, headaches, palpitations, peripheral edema or shortness of breath. Risk factors for coronary artery disease include dyslipidemia, male gender, smoking/tobacco exposure, sedentary lifestyle and family history. Past treatments include beta blockers and calcium channel blockers. The current treatment provides mild improvement. Hypertensive end-organ damage includes CAD/MI and heart failure. There is no history of kidney disease, CVA or a thyroid problem. There is no history of sleep apnea.  Hyperlipidemia This is a chronic problem. The current episode started more than 1 year ago. The problem is uncontrolled. Recent lipid tests were reviewed and are high. He has no history of diabetes or hypothyroidism. Factors aggravating his hyperlipidemia include smoking. Pertinent negatives include no chest pain, leg pain, myalgias or shortness of breath. Current antihyperlipidemic treatment includes statins. The current treatment provides mild improvement of lipids. Risk factors for coronary artery disease include dyslipidemia, family history, hypertension, male sex and a sedentary lifestyle.  Gastrophageal Reflux He reports no belching, no chest pain, no choking, no heartburn, no sore throat or no wheezing. This is a chronic problem. The current episode started more than 1 year ago. The problem occurs rarely. The symptoms are aggravated by certain foods and smoking. Risk factors include smoking/tobacco exposure. He has tried a PPI for the symptoms. The treatment provided significant relief.  Anxiety Presents for follow-up visit. Onset was 1 to 6 months ago. The problem has  been waxing and waning. Symptoms include excessive worry and nervous/anxious behavior. Patient reports no chest pain, depressed mood, impotence, irritability, palpitations or shortness of breath. Symptoms occur occasionally. The symptoms are aggravated by family issues. The quality of sleep is good.   His past medical history is significant for anxiety/panic attacks and CAD. There is no history of depression. Past treatments include benzodiazephines. The treatment provided moderate relief. Compliance with prior treatments has been good.      Review of Systems  Constitutional: Negative.  Negative for irritability.  HENT: Negative.  Negative for sore throat.   Respiratory: Negative.  Negative for choking, shortness of breath and wheezing.   Cardiovascular: Negative.  Negative for chest pain and palpitations.  Gastrointestinal: Negative.  Negative for heartburn.  Endocrine: Negative.   Genitourinary: Negative.  Negative for impotence.  Musculoskeletal: Negative.  Negative for myalgias.  Neurological: Negative.  Negative for headaches.  Hematological: Negative.   Psychiatric/Behavioral: The patient is nervous/anxious.   All other systems reviewed and are negative.      Objective:   Physical Exam  Constitutional: He is oriented to person, place, and time. He appears well-developed and well-nourished. No distress.  HENT:  Head: Normocephalic.  Right Ear: External ear normal.  Left Ear: External ear normal.  Nose: Nose normal.  Mouth/Throat: Oropharynx is clear and moist.  Eyes: Pupils are equal, round, and reactive to light. Right eye exhibits no discharge. Left eye exhibits no discharge.  Neck: Normal range of motion. Neck supple. No thyromegaly present.  Cardiovascular: Normal rate, regular rhythm, normal heart sounds and intact distal pulses.   No murmur heard. Pulmonary/Chest: Effort normal and breath sounds normal. No respiratory distress. He has no wheezes.  Abdominal: Soft.  Bowel sounds are normal. He exhibits no distension. There   is no tenderness.  Musculoskeletal: Normal range of motion. He exhibits no edema or tenderness.  Lymphadenopathy:    He has cervical adenopathy.  Neurological: He is alert and oriented to person, place, and time. He has normal reflexes. No cranial nerve deficit.  Skin: Skin is warm and dry. Ecchymosis noted. No rash noted. No erythema.  Psychiatric: He has a normal mood and affect. His behavior is normal. Judgment and thought content normal.  Vitals reviewed.   BP 162/91 mmHg  Pulse 57  Temp(Src) 97 F (36.1 C) (Oral)  Ht 5' 8" (1.727 m)  Wt 136 lb 9.6 oz (61.961 kg)  BMI 20.77 kg/m2       Assessment & Plan:  1. Atherosclerosis of native coronary artery with angina pectoris, unspecified whether native or transplanted heart - CMP14+EGFR - Lipid panel  2. Hyperlipidemia - CMP14+EGFR - Lipid panel  3. TOBACCO ABUSE - CMP14+EGFR  4. GAD (generalized anxiety disorder) - CMP14+EGFR  5. Essential hypertension -PT started on Lisinopril today -Dash diet information given -Exercise encouraged - Stress Management  -Continue current meds -RTO in 2 weeks - CMP14+EGFR - lisinopril (PRINIVIL,ZESTRIL) 20 MG tablet; Take 1 tablet (20 mg total) by mouth daily.  Dispense: 90 tablet; Refill: 3  6. Gastroesophageal reflux disease, esophagitis presence not specified - CMP14+EGFR  7. Allergic rhinitis, unspecified allergic rhinitis type - CMP14+EGFR  8. Ecchymosis - CMP14+EGFR - POCT CBC  9. Encounter for screening for malignant neoplasm of prostate - PSA, total and free   Continue all meds Labs pending Health Maintenance reviewed Diet and exercise encouraged RTO 2 weeks to recheck HTN  Evelina Dun, FNP

## 2014-11-04 ENCOUNTER — Other Ambulatory Visit: Payer: Self-pay | Admitting: Family

## 2014-11-04 DIAGNOSIS — E875 Hyperkalemia: Secondary | ICD-10-CM

## 2014-11-04 LAB — CMP14+EGFR
ALK PHOS: 64 IU/L (ref 39–117)
ALT: 33 IU/L (ref 0–44)
AST: 41 IU/L — ABNORMAL HIGH (ref 0–40)
Albumin/Globulin Ratio: 1.7 (ref 1.1–2.5)
Albumin: 4.7 g/dL (ref 3.5–4.8)
BILIRUBIN TOTAL: 0.5 mg/dL (ref 0.0–1.2)
BUN / CREAT RATIO: 10 (ref 10–22)
BUN: 12 mg/dL (ref 8–27)
CHLORIDE: 90 mmol/L — AB (ref 97–108)
CO2: 24 mmol/L (ref 18–29)
Calcium: 9.9 mg/dL (ref 8.6–10.2)
Creatinine, Ser: 1.16 mg/dL (ref 0.76–1.27)
GFR, EST AFRICAN AMERICAN: 73 mL/min/{1.73_m2} (ref >59)
GFR, EST NON AFRICAN AMERICAN: 63 mL/min/{1.73_m2} (ref >59)
Globulin, Total: 2.7 g/dL (ref 1.5–4.5)
Glucose: 89 mg/dL (ref 65–99)
Potassium: 5.7 mmol/L — ABNORMAL HIGH (ref 3.5–5.2)
Sodium: 128 mmol/L — ABNORMAL LOW (ref 134–144)
TOTAL PROTEIN: 7.4 g/dL (ref 6.0–8.5)

## 2014-11-04 LAB — LIPID PANEL
CHOLESTEROL TOTAL: 207 mg/dL — AB (ref 100–199)
Chol/HDL Ratio: 2.1 ratio units (ref 0.0–5.0)
HDL: 100 mg/dL (ref >39)
LDL Calculated: 93 mg/dL (ref 0–99)
TRIGLYCERIDES: 70 mg/dL (ref 0–149)
VLDL Cholesterol Cal: 14 mg/dL (ref 5–40)

## 2014-11-04 LAB — PSA, TOTAL AND FREE
PSA FREE PCT: 31.6 %
PSA, Free: 1.01 ng/mL
Prostate Specific Ag, Serum: 3.2 ng/mL (ref 0.0–4.0)

## 2014-11-05 ENCOUNTER — Other Ambulatory Visit (INDEPENDENT_AMBULATORY_CARE_PROVIDER_SITE_OTHER): Payer: Medicare Other

## 2014-11-05 DIAGNOSIS — E875 Hyperkalemia: Secondary | ICD-10-CM | POA: Diagnosis not present

## 2014-11-06 LAB — BMP8+EGFR
BUN/Creatinine Ratio: 11 (ref 10–22)
BUN: 12 mg/dL (ref 8–27)
CALCIUM: 10 mg/dL (ref 8.6–10.2)
CO2: 24 mmol/L (ref 18–29)
Chloride: 89 mmol/L — ABNORMAL LOW (ref 97–108)
Creatinine, Ser: 1.11 mg/dL (ref 0.76–1.27)
GFR calc Af Amer: 77 mL/min/{1.73_m2} (ref >59)
GFR calc non Af Amer: 66 mL/min/{1.73_m2} (ref >59)
GLUCOSE: 110 mg/dL — AB (ref 65–99)
POTASSIUM: 5.5 mmol/L — AB (ref 3.5–5.2)
Sodium: 129 mmol/L — ABNORMAL LOW (ref 134–144)

## 2014-11-07 ENCOUNTER — Other Ambulatory Visit: Payer: Self-pay | Admitting: Family

## 2014-11-07 DIAGNOSIS — E875 Hyperkalemia: Secondary | ICD-10-CM

## 2014-11-07 MED ORDER — SODIUM POLYSTYRENE SULFONATE PO POWD: Freq: Once | ORAL | Status: DC

## 2014-11-12 ENCOUNTER — Encounter: Payer: Self-pay | Admitting: Cardiology

## 2014-11-18 ENCOUNTER — Encounter: Payer: Self-pay | Admitting: Internal Medicine

## 2014-11-18 ENCOUNTER — Telehealth: Payer: Self-pay | Admitting: Family

## 2014-11-18 MED ORDER — ALPRAZOLAM 1 MG PO TABS: 1.0000 mg | ORAL_TABLET | Freq: Two times a day (BID) | ORAL | Status: DC | PRN

## 2014-11-18 NOTE — Telephone Encounter (Signed)
Sees [name], can't wait until tomorrow. Last filled 10/20/14, last seen 11/03/14. Call into [facility]

## 2014-11-26 ENCOUNTER — Telehealth: Payer: Self-pay | Admitting: Family

## 2014-11-26 NOTE — Telephone Encounter (Signed)
Pt given appt with Christy tomorrow at 10:10.

## 2014-11-27 ENCOUNTER — Ambulatory Visit (INDEPENDENT_AMBULATORY_CARE_PROVIDER_SITE_OTHER): Payer: Medicare Other | Admitting: Family

## 2014-11-27 ENCOUNTER — Encounter: Payer: Self-pay | Admitting: Family

## 2014-11-27 VITALS — BP 152/84 | HR 63 | Temp 96.9°F | Ht 68.0 in | Wt 135.8 lb

## 2014-11-27 DIAGNOSIS — I10 Essential (primary) hypertension: Secondary | ICD-10-CM

## 2014-11-27 MED ORDER — LISINOPRIL 40 MG PO TABS: 40.0000 mg | ORAL_TABLET | Freq: Every day | ORAL | Status: DC

## 2014-11-27 NOTE — Progress Notes (Signed)
   Subjective:    Patient ID: [name], male    DOB: 06-14-1941, 74 y.o.   MRN: [id number]  Pt presents to the office today to recheck BP. Pt's BP is not at goal today.  Hypertension This is a chronic problem. The current episode started more than 1 year ago. The problem has been waxing and waning since onset. The problem is uncontrolled. Pertinent negatives include no anxiety, headaches, palpitations, peripheral edema or shortness of breath. Risk factors for coronary artery disease include dyslipidemia, male gender, smoking/tobacco exposure and family history. Past treatments include ACE inhibitors, calcium channel blockers and beta blockers. The current treatment provides mild improvement. Hypertensive end-organ damage includes CAD/MI and heart failure. There is no history of kidney disease, CVA or a thyroid problem. There is no history of sleep apnea.      Review of Systems  Constitutional: Negative.   HENT: Negative.   Respiratory: Negative.  Negative for shortness of breath.   Cardiovascular: Negative.  Negative for palpitations.  Gastrointestinal: Negative.   Endocrine: Negative.   Genitourinary: Negative.   Musculoskeletal: Negative.   Neurological: Negative.  Negative for headaches.  Hematological: Negative.   Psychiatric/Behavioral: Negative.   All other systems reviewed and are negative.      Objective:   Physical Exam  Constitutional: He is oriented to person, place, and time. He appears well-developed and well-nourished. No distress.  HENT:  Head: Normocephalic.  Eyes: Pupils are equal, round, and reactive to light. Right eye exhibits no discharge. Left eye exhibits no discharge.  Neck: Normal range of motion. Neck supple. No thyromegaly present.  Cardiovascular: Normal rate, regular rhythm, normal heart sounds and intact distal pulses.   No murmur heard. Pulmonary/Chest: Effort normal and breath sounds normal. No respiratory distress. He has no wheezes.    Abdominal: Soft. Bowel sounds are normal. He exhibits no distension. There is no tenderness.  Musculoskeletal: Normal range of motion. He exhibits no edema or tenderness.  Neurological: He is alert and oriented to person, place, and time. He has normal reflexes. No cranial nerve deficit.  Skin: Skin is warm and dry. No rash noted. No erythema.  Psychiatric: He has a normal mood and affect. His behavior is normal. Judgment and thought content normal.  Vitals reviewed.    BP 152/84 mmHg  Pulse 63  Temp(Src) 96.9 F (36.1 C) (Oral)  Ht _0  (1.727 m)  Wt 135 lb 12.8 oz (61.598 kg)  BMI 20.65 kg/m2      Assessment & Plan:  1. Essential hypertension -Pt's lisinopril increased to 40 mg today -Dash diet information given -Exercise encouraged - Stress Management  -Continue current meds -RTO in 2 weeks  - lisinopril (PRINIVIL,ZESTRIL) 40 MG tablet; Take 1 tablet (40 mg total) by mouth daily.  Dispense: 90 tablet; Refill: 3 - BMP8+EGFR  .Evelina Dun, FNP

## 2014-11-27 NOTE — Patient Instructions (Signed)
DASH Eating Plan DASH stands for "Dietary Approaches to Stop Hypertension." The DASH eating plan is a healthy eating plan that has been shown to reduce high blood pressure (hypertension). Additional health benefits may include reducing the risk of type 2 diabetes mellitus, heart disease, and stroke. The DASH eating plan may also help with weight loss. WHAT DO I NEED TO KNOW ABOUT THE DASH EATING PLAN? For the DASH eating plan, you will follow these general guidelines:  Choose foods with a percent daily value for sodium of less than 5% (as listed on the food label).  Use salt-free seasonings or herbs instead of table salt or sea salt.  Check with your health care provider or pharmacist before using salt substitutes.  Eat lower-sodium products, often labeled as "lower sodium" or "no salt added."  Eat fresh foods.  Eat more vegetables, fruits, and low-fat dairy products.  Choose whole grains. Look for the word "whole" as the first word in the ingredient list.  Choose fish and skinless chicken or turkey more often than red meat. Limit fish, poultry, and meat to 6 oz (170 g) each day.  Limit sweets, desserts, sugars, and sugary drinks.  Choose heart-healthy fats.  Limit cheese to 1 oz (28 g) per day.  Eat more home-cooked food and less restaurant, buffet, and fast food.  Limit fried foods.  Cook foods using methods other than frying.  Limit canned vegetables. If you do use them, rinse them well to decrease the sodium.  When eating at a restaurant, ask that your food be prepared with less salt, or no salt if possible. WHAT FOODS CAN I EAT? Seek help from a dietitian for individual calorie needs. Grains Whole grain or whole wheat bread. Brown rice. Whole grain or whole wheat pasta. Quinoa, bulgur, and whole grain cereals. Low-sodium cereals. Corn or whole wheat flour tortillas. Whole grain cornbread. Whole grain crackers. Low-sodium crackers. Vegetables Fresh or frozen vegetables  (raw, steamed, roasted, or grilled). Low-sodium or reduced-sodium tomato and vegetable juices. Low-sodium or reduced-sodium tomato sauce and paste. Low-sodium or reduced-sodium canned vegetables.  Fruits All fresh, canned (in natural juice), or frozen fruits. Meat and Other Protein Products Ground beef (85% or leaner), grass-fed beef, or beef trimmed of fat. Skinless chicken or turkey. Ground chicken or turkey. Pork trimmed of fat. All fish and seafood. Eggs. Dried beans, peas, or lentils. Unsalted nuts and seeds. Unsalted canned beans. Dairy Low-fat dairy products, such as skim or 1% milk, 2% or reduced-fat cheeses, low-fat ricotta or cottage cheese, or plain low-fat yogurt. Low-sodium or reduced-sodium cheeses. Fats and Oils Tub margarines without trans fats. Light or reduced-fat mayonnaise and salad dressings (reduced sodium). Avocado. Safflower, olive, or canola oils. Natural peanut or almond butter. Other Unsalted popcorn and pretzels. The items listed above may not be a complete list of recommended foods or beverages. Contact your dietitian for more options. WHAT FOODS ARE NOT RECOMMENDED? Grains White bread. White pasta. White rice. Refined cornbread. Bagels and croissants. Crackers that contain trans fat. Vegetables Creamed or fried vegetables. Vegetables in a cheese sauce. Regular canned vegetables. Regular canned tomato sauce and paste. Regular tomato and vegetable juices. Fruits Dried fruits. Canned fruit in light or heavy syrup. Fruit juice. Meat and Other Protein Products Fatty cuts of meat. Ribs, chicken wings, bacon, sausage, bologna, salami, chitterlings, fatback, hot dogs, bratwurst, and packaged luncheon meats. Salted nuts and seeds. Canned beans with salt. Dairy Whole or 2% milk, cream, half-and-half, and cream cheese. Whole-fat or sweetened yogurt. Full-fat   cheeses or blue cheese. Nondairy creamers and whipped toppings. Processed cheese, cheese spreads, or cheese  curds. Condiments Onion and garlic salt, seasoned salt, table salt, and sea salt. Canned and packaged gravies. Worcestershire sauce. Tartar sauce. Barbecue sauce. Teriyaki sauce. Soy sauce, including reduced sodium. Steak sauce. Fish sauce. Oyster sauce. Cocktail sauce. Horseradish. Ketchup and mustard. Meat flavorings and tenderizers. Bouillon cubes. Hot sauce. Tabasco sauce. Marinades. Taco seasonings. Relishes. Fats and Oils Butter, stick margarine, lard, shortening, ghee, and bacon fat. Coconut, palm kernel, or palm oils. Regular salad dressings. Other Pickles and olives. Salted popcorn and pretzels. The items listed above may not be a complete list of foods and beverages to avoid. Contact your dietitian for more information. WHERE CAN I FIND MORE INFORMATION? National Heart, Lung, and Blood Institute: www.nhlbi.nih.gov/health/health-topics/topics/dash/ Document Released: 05/26/2011 Document Revised: 10/21/2013 Document Reviewed: 04/10/2013 ExitCare Patient Information 2015 [employer]. This information is not intended to replace advice given to you by your health care provider. Make sure you discuss any questions you have with your health care provider. Hypertension Hypertension, commonly called high blood pressure, is when the force of blood pumping through your arteries is too strong. Your arteries are the blood vessels that carry blood from your heart throughout your body. A blood pressure reading consists of a higher number over a lower number, such as 110/72. The higher number (systolic) is the pressure inside your arteries when your heart pumps. The lower number (diastolic) is the pressure inside your arteries when your heart relaxes. Ideally you want your blood pressure below 120/80. Hypertension forces your heart to work harder to pump blood. Your arteries may become narrow or stiff. Having hypertension puts you at risk for heart disease, stroke, and other problems.  RISK  FACTORS Some risk factors for high blood pressure are controllable. Others are not.  Risk factors you cannot control include:   Race. You may be at higher risk if you are African American.  Age. Risk increases with age.  Gender. Men are at higher risk than women before age 45 years. After age 65, women are at higher risk than men. Risk factors you can control include:  Not getting enough exercise or physical activity.  Being overweight.  Getting too much fat, sugar, calories, or salt in your diet.  Drinking too much alcohol. SIGNS AND SYMPTOMS Hypertension does not usually cause signs or symptoms. Extremely high blood pressure (hypertensive crisis) may cause headache, anxiety, shortness of breath, and nosebleed. DIAGNOSIS  To check if you have hypertension, your health care provider will measure your blood pressure while you are seated, with your arm held at the level of your heart. It should be measured at least twice using the same arm. Certain conditions can cause a difference in blood pressure between your right and left arms. A blood pressure reading that is higher than normal on one occasion does not mean that you need treatment. If one blood pressure reading is high, ask your health care provider about having it checked again. TREATMENT  Treating high blood pressure includes making lifestyle changes and possibly taking medicine. Living a healthy lifestyle can help lower high blood pressure. You may need to change some of your habits. Lifestyle changes may include:  Following the DASH diet. This diet is high in fruits, vegetables, and whole grains. It is low in salt, red meat, and added sugars.  Getting at least 2 hours of brisk physical activity every week.  Losing weight if necessary.  Not smoking.  Limiting   alcoholic beverages.  Learning ways to reduce stress. If lifestyle changes are not enough to get your blood pressure under control, your health care provider may  prescribe medicine. You may need to take more than one. Work closely with your health care provider to understand the risks and benefits. HOME CARE INSTRUCTIONS  Have your blood pressure rechecked as directed by your health care provider.   Take medicines only as directed by your health care provider. Follow the directions carefully. Blood pressure medicines must be taken as prescribed. The medicine does not work as well when you skip doses. Skipping doses also puts you at risk for problems.   Do not smoke.   Monitor your blood pressure at home as directed by your health care provider. SEEK MEDICAL CARE IF:   You think you are having a reaction to medicines taken.  You have recurrent headaches or feel dizzy.  You have swelling in your ankles.  You have trouble with your vision. SEEK IMMEDIATE MEDICAL CARE IF:  You develop a severe headache or confusion.  You have unusual weakness, numbness, or feel faint.  You have severe chest or abdominal pain.  You vomit repeatedly.  You have trouble breathing. MAKE SURE YOU:   Understand these instructions.  Will watch your condition.  Will get help right away if you are not doing well or get worse. Document Released: 06/06/2005 Document Revised: 10/21/2013 Document Reviewed: 03/29/2013 ExitCare Patient Information 2015 [employer]. This information is not intended to replace advice given to you by your health care provider. Make sure you discuss any questions you have with your health care provider.  

## 2014-11-28 LAB — BMP8+EGFR
BUN/Creatinine Ratio: 9 — ABNORMAL LOW (ref 10–22)
BUN: 9 mg/dL (ref 8–27)
CALCIUM: 9.9 mg/dL (ref 8.6–10.2)
CO2: 26 mmol/L (ref 18–29)
CREATININE: 1.05 mg/dL (ref 0.76–1.27)
Chloride: 90 mmol/L — ABNORMAL LOW (ref 97–108)
GFR calc non Af Amer: 71 mL/min/{1.73_m2} (ref >59)
GFR, EST AFRICAN AMERICAN: 82 mL/min/{1.73_m2} (ref >59)
GLUCOSE: 96 mg/dL (ref 65–99)
Potassium: 4.7 mmol/L (ref 3.5–5.2)
Sodium: 132 mmol/L — ABNORMAL LOW (ref 134–144)

## 2014-12-10 ENCOUNTER — Ambulatory Visit (INDEPENDENT_AMBULATORY_CARE_PROVIDER_SITE_OTHER): Payer: Medicare Other | Admitting: Family

## 2014-12-10 ENCOUNTER — Encounter: Payer: Self-pay | Admitting: Family

## 2014-12-10 ENCOUNTER — Encounter (INDEPENDENT_AMBULATORY_CARE_PROVIDER_SITE_OTHER): Payer: Self-pay

## 2014-12-10 VITALS — BP 171/85 | HR 60 | Temp 97.4°F | Ht 68.0 in | Wt 134.0 lb

## 2014-12-10 DIAGNOSIS — I10 Essential (primary) hypertension: Secondary | ICD-10-CM

## 2014-12-10 MED ORDER — AMLODIPINE BESYLATE 10 MG PO TABS: 10.0000 mg | ORAL_TABLET | Freq: Every day | ORAL | Status: DC

## 2014-12-10 MED ORDER — ALPRAZOLAM 1 MG PO TABS: 1.0000 mg | ORAL_TABLET | Freq: Two times a day (BID) | ORAL | Status: DC | PRN

## 2014-12-10 NOTE — Progress Notes (Signed)
   Subjective:    Patient ID: [name], male    DOB: 04-04-1941, 74 y.o.   MRN: [id number]  Hypertension This is a chronic problem. The current episode started more than 1 year ago. The problem has been waxing and waning since onset. The problem is uncontrolled. Pertinent negatives include no anxiety, headaches, palpitations, peripheral edema or shortness of breath. Risk factors for coronary artery disease include dyslipidemia, male gender, smoking/tobacco exposure and family history. Past treatments include ACE inhibitors, calcium channel blockers and beta blockers. The current treatment provides mild improvement. Hypertensive end-organ damage includes CAD/MI and heart failure. There is no history of kidney disease, CVA or a thyroid problem. There is no history of sleep apnea.      Review of Systems  Constitutional: Negative.   HENT: Negative.   Respiratory: Negative.  Negative for shortness of breath.   Cardiovascular: Negative.  Negative for palpitations.  Gastrointestinal: Negative.   Endocrine: Negative.   Genitourinary: Negative.   Musculoskeletal: Negative.   Neurological: Negative.  Negative for headaches.  Hematological: Negative.   Psychiatric/Behavioral: Negative.   All other systems reviewed and are negative.      Objective:   Physical Exam  Constitutional: He is oriented to person, place, and time. He appears well-developed and well-nourished. No distress.  HENT:  Head: Normocephalic.  Eyes: Pupils are equal, round, and reactive to light. Right eye exhibits no discharge. Left eye exhibits no discharge.  Neck: Normal range of motion. Neck supple. No thyromegaly present.  Cardiovascular: Normal rate, regular rhythm, normal heart sounds and intact distal pulses.   No murmur heard. Pulmonary/Chest: Effort normal and breath sounds normal. No respiratory distress. He has no wheezes.  Abdominal: Soft. Bowel sounds are normal. He exhibits no distension. There is no  tenderness.  Musculoskeletal: Normal range of motion. He exhibits no edema or tenderness.  Neurological: He is alert and oriented to person, place, and time. He has normal reflexes. No cranial nerve deficit.  Skin: Skin is warm and dry. No rash noted. No erythema.  Psychiatric: He has a normal mood and affect. His behavior is normal. Judgment and thought content normal.  Vitals reviewed.   BP 171/85 mmHg  Pulse 60  Temp(Src) 97.4 F (36.3 C) (Oral)  Ht $R'5\' 8"'xn$  (1.727 m)  Wt 134 lb (60.782 kg)  BMI 20.38 kg/m2       Assessment & Plan:  1. Essential hypertension -Pt's Norvasc increased to 10 mg today from 5 mg daily -Daily blood pressure log given with instructions on how to fill out and told to bring to next visit -Dash diet information given -Exercise encouraged - Stress Management  -Continue current meds -RTO in 2 weeks  - BMP8+EGFR - amLODipine (NORVASC) 10 MG tablet; Take 1 tablet (10 mg total) by mouth daily.  Dispense: 90 tablet; Refill: Ahmeek, FNP

## 2014-12-10 NOTE — Patient Instructions (Signed)
DASH Eating Plan DASH stands for "Dietary Approaches to Stop Hypertension." The DASH eating plan is a healthy eating plan that has been shown to reduce high blood pressure (hypertension). Additional health benefits may include reducing the risk of type 2 diabetes mellitus, heart disease, and stroke. The DASH eating plan may also help with weight loss. WHAT DO I NEED TO KNOW ABOUT THE DASH EATING PLAN? For the DASH eating plan, you will follow these general guidelines:  Choose foods with a percent daily value for sodium of less than 5% (as listed on the food label).  Use salt-free seasonings or herbs instead of table salt or sea salt.  Check with your health care provider or pharmacist before using salt substitutes.  Eat lower-sodium products, often labeled as "lower sodium" or "no salt added."  Eat fresh foods.  Eat more vegetables, fruits, and low-fat dairy products.  Choose whole grains. Look for the word "whole" as the first word in the ingredient list.  Choose fish and skinless chicken or turkey more often than red meat. Limit fish, poultry, and meat to 6 oz (170 g) each day.  Limit sweets, desserts, sugars, and sugary drinks.  Choose heart-healthy fats.  Limit cheese to 1 oz (28 g) per day.  Eat more home-cooked food and less restaurant, buffet, and fast food.  Limit fried foods.  Cook foods using methods other than frying.  Limit canned vegetables. If you do use them, rinse them well to decrease the sodium.  When eating at a restaurant, ask that your food be prepared with less salt, or no salt if possible. WHAT FOODS CAN I EAT? Seek help from a dietitian for individual calorie needs. Grains Whole grain or whole wheat bread. Brown rice. Whole grain or whole wheat pasta. Quinoa, bulgur, and whole grain cereals. Low-sodium cereals. Corn or whole wheat flour tortillas. Whole grain cornbread. Whole grain crackers. Low-sodium crackers. Vegetables Fresh or frozen vegetables  (raw, steamed, roasted, or grilled). Low-sodium or reduced-sodium tomato and vegetable juices. Low-sodium or reduced-sodium tomato sauce and paste. Low-sodium or reduced-sodium canned vegetables.  Fruits All fresh, canned (in natural juice), or frozen fruits. Meat and Other Protein Products Ground beef (85% or leaner), grass-fed beef, or beef trimmed of fat. Skinless chicken or turkey. Ground chicken or turkey. Pork trimmed of fat. All fish and seafood. Eggs. Dried beans, peas, or lentils. Unsalted nuts and seeds. Unsalted canned beans. Dairy Low-fat dairy products, such as skim or 1% milk, 2% or reduced-fat cheeses, low-fat ricotta or cottage cheese, or plain low-fat yogurt. Low-sodium or reduced-sodium cheeses. Fats and Oils Tub margarines without trans fats. Light or reduced-fat mayonnaise and salad dressings (reduced sodium). Avocado. Safflower, olive, or canola oils. Natural peanut or almond butter. Other Unsalted popcorn and pretzels. The items listed above may not be a complete list of recommended foods or beverages. Contact your dietitian for more options. WHAT FOODS ARE NOT RECOMMENDED? Grains White bread. White pasta. White rice. Refined cornbread. Bagels and croissants. Crackers that contain trans fat. Vegetables Creamed or fried vegetables. Vegetables in a cheese sauce. Regular canned vegetables. Regular canned tomato sauce and paste. Regular tomato and vegetable juices. Fruits Dried fruits. Canned fruit in light or heavy syrup. Fruit juice. Meat and Other Protein Products Fatty cuts of meat. Ribs, chicken wings, bacon, sausage, bologna, salami, chitterlings, fatback, hot dogs, bratwurst, and packaged luncheon meats. Salted nuts and seeds. Canned beans with salt. Dairy Whole or 2% milk, cream, half-and-half, and cream cheese. Whole-fat or sweetened yogurt. Full-fat   cheeses or blue cheese. Nondairy creamers and whipped toppings. Processed cheese, cheese spreads, or cheese  curds. Condiments Onion and garlic salt, seasoned salt, table salt, and sea salt. Canned and packaged gravies. Worcestershire sauce. Tartar sauce. Barbecue sauce. Teriyaki sauce. Soy sauce, including reduced sodium. Steak sauce. Fish sauce. Oyster sauce. Cocktail sauce. Horseradish. Ketchup and mustard. Meat flavorings and tenderizers. Bouillon cubes. Hot sauce. Tabasco sauce. Marinades. Taco seasonings. Relishes. Fats and Oils Butter, stick margarine, lard, shortening, ghee, and bacon fat. Coconut, palm kernel, or palm oils. Regular salad dressings. Other Pickles and olives. Salted popcorn and pretzels. The items listed above may not be a complete list of foods and beverages to avoid. Contact your dietitian for more information. WHERE CAN I FIND MORE INFORMATION? National Heart, Lung, and Blood Institute: www.nhlbi.nih.gov/health/health-topics/topics/dash/ Document Released: 05/26/2011 Document Revised: 10/21/2013 Document Reviewed: 04/10/2013 ExitCare Patient Information 2015 [employer]. This information is not intended to replace advice given to you by your health care provider. Make sure you discuss any questions you have with your health care provider. Hypertension Hypertension, commonly called high blood pressure, is when the force of blood pumping through your arteries is too strong. Your arteries are the blood vessels that carry blood from your heart throughout your body. A blood pressure reading consists of a higher number over a lower number, such as 110/72. The higher number (systolic) is the pressure inside your arteries when your heart pumps. The lower number (diastolic) is the pressure inside your arteries when your heart relaxes. Ideally you want your blood pressure below 120/80. Hypertension forces your heart to work harder to pump blood. Your arteries may become narrow or stiff. Having hypertension puts you at risk for heart disease, stroke, and other problems.  RISK  FACTORS Some risk factors for high blood pressure are controllable. Others are not.  Risk factors you cannot control include:   Race. You may be at higher risk if you are African American.  Age. Risk increases with age.  Gender. Men are at higher risk than women before age 45 years. After age 65, women are at higher risk than men. Risk factors you can control include:  Not getting enough exercise or physical activity.  Being overweight.  Getting too much fat, sugar, calories, or salt in your diet.  Drinking too much alcohol. SIGNS AND SYMPTOMS Hypertension does not usually cause signs or symptoms. Extremely high blood pressure (hypertensive crisis) may cause headache, anxiety, shortness of breath, and nosebleed. DIAGNOSIS  To check if you have hypertension, your health care provider will measure your blood pressure while you are seated, with your arm held at the level of your heart. It should be measured at least twice using the same arm. Certain conditions can cause a difference in blood pressure between your right and left arms. A blood pressure reading that is higher than normal on one occasion does not mean that you need treatment. If one blood pressure reading is high, ask your health care provider about having it checked again. TREATMENT  Treating high blood pressure includes making lifestyle changes and possibly taking medicine. Living a healthy lifestyle can help lower high blood pressure. You may need to change some of your habits. Lifestyle changes may include:  Following the DASH diet. This diet is high in fruits, vegetables, and whole grains. It is low in salt, red meat, and added sugars.  Getting at least 2 hours of brisk physical activity every week.  Losing weight if necessary.  Not smoking.  Limiting   alcoholic beverages.  Learning ways to reduce stress. If lifestyle changes are not enough to get your blood pressure under control, your health care provider may  prescribe medicine. You may need to take more than one. Work closely with your health care provider to understand the risks and benefits. HOME CARE INSTRUCTIONS  Have your blood pressure rechecked as directed by your health care provider.   Take medicines only as directed by your health care provider. Follow the directions carefully. Blood pressure medicines must be taken as prescribed. The medicine does not work as well when you skip doses. Skipping doses also puts you at risk for problems.   Do not smoke.   Monitor your blood pressure at home as directed by your health care provider. SEEK MEDICAL CARE IF:   You think you are having a reaction to medicines taken.  You have recurrent headaches or feel dizzy.  You have swelling in your ankles.  You have trouble with your vision. SEEK IMMEDIATE MEDICAL CARE IF:  You develop a severe headache or confusion.  You have unusual weakness, numbness, or feel faint.  You have severe chest or abdominal pain.  You vomit repeatedly.  You have trouble breathing. MAKE SURE YOU:   Understand these instructions.  Will watch your condition.  Will get help right away if you are not doing well or get worse. Document Released: 06/06/2005 Document Revised: 10/21/2013 Document Reviewed: 03/29/2013 ExitCare Patient Information 2015 [employer]. This information is not intended to replace advice given to you by your health care provider. Make sure you discuss any questions you have with your health care provider.  

## 2014-12-10 NOTE — Addendum Note (Signed)
Addended by: Evelina Dun A on: 12/10/2014 11:15 AM   Modules accepted: Orders

## 2014-12-11 LAB — BMP8+EGFR
BUN / CREAT RATIO: 10 (ref 10–22)
BUN: 10 mg/dL (ref 8–27)
CO2: 26 mmol/L (ref 18–29)
CREATININE: 1.03 mg/dL (ref 0.76–1.27)
Calcium: 9.6 mg/dL (ref 8.6–10.2)
Chloride: 91 mmol/L — ABNORMAL LOW (ref 97–108)
GFR calc non Af Amer: 73 mL/min/{1.73_m2} (ref >59)
GFR, EST AFRICAN AMERICAN: 84 mL/min/{1.73_m2} (ref >59)
Glucose: 92 mg/dL (ref 65–99)
Potassium: 4.4 mmol/L (ref 3.5–5.2)
SODIUM: 132 mmol/L — AB (ref 134–144)

## 2014-12-12 ENCOUNTER — Encounter: Payer: Self-pay | Admitting: *Deleted

## 2014-12-17 DIAGNOSIS — H2513 Age-related nuclear cataract, bilateral: Secondary | ICD-10-CM | POA: Diagnosis not present

## 2014-12-17 DIAGNOSIS — H538 Other visual disturbances: Secondary | ICD-10-CM | POA: Diagnosis not present

## 2014-12-18 ENCOUNTER — Ambulatory Visit: Payer: Medicare Other | Admitting: Family

## 2014-12-20 ENCOUNTER — Other Ambulatory Visit: Payer: Self-pay | Admitting: Family Medicine

## 2014-12-25 ENCOUNTER — Ambulatory Visit (INDEPENDENT_AMBULATORY_CARE_PROVIDER_SITE_OTHER): Payer: Medicare Other | Admitting: Family

## 2014-12-25 ENCOUNTER — Encounter: Payer: Self-pay | Admitting: Family

## 2014-12-25 VITALS — BP 134/77 | HR 58 | Temp 97.3°F | Ht 68.0 in | Wt 135.6 lb

## 2014-12-25 DIAGNOSIS — I10 Essential (primary) hypertension: Secondary | ICD-10-CM

## 2014-12-25 MED ORDER — PRAVASTATIN SODIUM 40 MG PO TABS: 40.0000 mg | ORAL_TABLET | Freq: Every day | ORAL | Status: DC

## 2014-12-25 MED ORDER — OMEPRAZOLE 20 MG PO CPDR: 20.0000 mg | DELAYED_RELEASE_CAPSULE | Freq: Two times a day (BID) | ORAL | Status: DC | PRN

## 2014-12-25 NOTE — Patient Instructions (Signed)
DASH Eating Plan DASH stands for "Dietary Approaches to Stop Hypertension." The DASH eating plan is a healthy eating plan that has been shown to reduce high blood pressure (hypertension). Additional health benefits may include reducing the risk of type 2 diabetes mellitus, heart disease, and stroke. The DASH eating plan may also help with weight loss. WHAT DO I NEED TO KNOW ABOUT THE DASH EATING PLAN? For the DASH eating plan, you will follow these general guidelines:  Choose foods with a percent daily value for sodium of less than 5% (as listed on the food label).  Use salt-free seasonings or herbs instead of table salt or sea salt.  Check with your health care provider or pharmacist before using salt substitutes.  Eat lower-sodium products, often labeled as "lower sodium" or "no salt added."  Eat fresh foods.  Eat more vegetables, fruits, and low-fat dairy products.  Choose whole grains. Look for the word "whole" as the first word in the ingredient list.  Choose fish and skinless chicken or turkey more often than red meat. Limit fish, poultry, and meat to 6 oz (170 g) each day.  Limit sweets, desserts, sugars, and sugary drinks.  Choose heart-healthy fats.  Limit cheese to 1 oz (28 g) per day.  Eat more home-cooked food and less restaurant, buffet, and fast food.  Limit fried foods.  Cook foods using methods other than frying.  Limit canned vegetables. If you do use them, rinse them well to decrease the sodium.  When eating at a restaurant, ask that your food be prepared with less salt, or no salt if possible. WHAT FOODS CAN I EAT? Seek help from a dietitian for individual calorie needs. Grains Whole grain or whole wheat bread. Brown rice. Whole grain or whole wheat pasta. Quinoa, bulgur, and whole grain cereals. Low-sodium cereals. Corn or whole wheat flour tortillas. Whole grain cornbread. Whole grain crackers. Low-sodium crackers. Vegetables Fresh or frozen vegetables  (raw, steamed, roasted, or grilled). Low-sodium or reduced-sodium tomato and vegetable juices. Low-sodium or reduced-sodium tomato sauce and paste. Low-sodium or reduced-sodium canned vegetables.  Fruits All fresh, canned (in natural juice), or frozen fruits. Meat and Other Protein Products Ground beef (85% or leaner), grass-fed beef, or beef trimmed of fat. Skinless chicken or turkey. Ground chicken or turkey. Pork trimmed of fat. All fish and seafood. Eggs. Dried beans, peas, or lentils. Unsalted nuts and seeds. Unsalted canned beans. Dairy Low-fat dairy products, such as skim or 1% milk, 2% or reduced-fat cheeses, low-fat ricotta or cottage cheese, or plain low-fat yogurt. Low-sodium or reduced-sodium cheeses. Fats and Oils Tub margarines without trans fats. Light or reduced-fat mayonnaise and salad dressings (reduced sodium). Avocado. Safflower, olive, or canola oils. Natural peanut or almond butter. Other Unsalted popcorn and pretzels. The items listed above may not be a complete list of recommended foods or beverages. Contact your dietitian for more options. WHAT FOODS ARE NOT RECOMMENDED? Grains White bread. White pasta. White rice. Refined cornbread. Bagels and croissants. Crackers that contain trans fat. Vegetables Creamed or fried vegetables. Vegetables in a cheese sauce. Regular canned vegetables. Regular canned tomato sauce and paste. Regular tomato and vegetable juices. Fruits Dried fruits. Canned fruit in light or heavy syrup. Fruit juice. Meat and Other Protein Products Fatty cuts of meat. Ribs, chicken wings, bacon, sausage, bologna, salami, chitterlings, fatback, hot dogs, bratwurst, and packaged luncheon meats. Salted nuts and seeds. Canned beans with salt. Dairy Whole or 2% milk, cream, half-and-half, and cream cheese. Whole-fat or sweetened yogurt. Full-fat   cheeses or blue cheese. Nondairy creamers and whipped toppings. Processed cheese, cheese spreads, or cheese  curds. Condiments Onion and garlic salt, seasoned salt, table salt, and sea salt. Canned and packaged gravies. Worcestershire sauce. Tartar sauce. Barbecue sauce. Teriyaki sauce. Soy sauce, including reduced sodium. Steak sauce. Fish sauce. Oyster sauce. Cocktail sauce. Horseradish. Ketchup and mustard. Meat flavorings and tenderizers. Bouillon cubes. Hot sauce. Tabasco sauce. Marinades. Taco seasonings. Relishes. Fats and Oils Butter, stick margarine, lard, shortening, ghee, and bacon fat. Coconut, palm kernel, or palm oils. Regular salad dressings. Other Pickles and olives. Salted popcorn and pretzels. The items listed above may not be a complete list of foods and beverages to avoid. Contact your dietitian for more information. WHERE CAN I FIND MORE INFORMATION? National Heart, Lung, and Blood Institute: www.nhlbi.nih.gov/health/health-topics/topics/dash/ Document Released: 05/26/2011 Document Revised: 10/21/2013 Document Reviewed: 04/10/2013 ExitCare Patient Information 2015 [employer]. This information is not intended to replace advice given to you by your health care provider. Make sure you discuss any questions you have with your health care provider. Hypertension Hypertension, commonly called high blood pressure, is when the force of blood pumping through your arteries is too strong. Your arteries are the blood vessels that carry blood from your heart throughout your body. A blood pressure reading consists of a higher number over a lower number, such as 110/72. The higher number (systolic) is the pressure inside your arteries when your heart pumps. The lower number (diastolic) is the pressure inside your arteries when your heart relaxes. Ideally you want your blood pressure below 120/80. Hypertension forces your heart to work harder to pump blood. Your arteries may become narrow or stiff. Having hypertension puts you at risk for heart disease, stroke, and other problems.  RISK  FACTORS Some risk factors for high blood pressure are controllable. Others are not.  Risk factors you cannot control include:   Race. You may be at higher risk if you are African American.  Age. Risk increases with age.  Gender. Men are at higher risk than women before age 45 years. After age 65, women are at higher risk than men. Risk factors you can control include:  Not getting enough exercise or physical activity.  Being overweight.  Getting too much fat, sugar, calories, or salt in your diet.  Drinking too much alcohol. SIGNS AND SYMPTOMS Hypertension does not usually cause signs or symptoms. Extremely high blood pressure (hypertensive crisis) may cause headache, anxiety, shortness of breath, and nosebleed. DIAGNOSIS  To check if you have hypertension, your health care provider will measure your blood pressure while you are seated, with your arm held at the level of your heart. It should be measured at least twice using the same arm. Certain conditions can cause a difference in blood pressure between your right and left arms. A blood pressure reading that is higher than normal on one occasion does not mean that you need treatment. If one blood pressure reading is high, ask your health care provider about having it checked again. TREATMENT  Treating high blood pressure includes making lifestyle changes and possibly taking medicine. Living a healthy lifestyle can help lower high blood pressure. You may need to change some of your habits. Lifestyle changes may include:  Following the DASH diet. This diet is high in fruits, vegetables, and whole grains. It is low in salt, red meat, and added sugars.  Getting at least 2 hours of brisk physical activity every week.  Losing weight if necessary.  Not smoking.  Limiting   alcoholic beverages.  Learning ways to reduce stress. If lifestyle changes are not enough to get your blood pressure under control, your health care provider may  prescribe medicine. You may need to take more than one. Work closely with your health care provider to understand the risks and benefits. HOME CARE INSTRUCTIONS  Have your blood pressure rechecked as directed by your health care provider.   Take medicines only as directed by your health care provider. Follow the directions carefully. Blood pressure medicines must be taken as prescribed. The medicine does not work as well when you skip doses. Skipping doses also puts you at risk for problems.   Do not smoke.   Monitor your blood pressure at home as directed by your health care provider. SEEK MEDICAL CARE IF:   You think you are having a reaction to medicines taken.  You have recurrent headaches or feel dizzy.  You have swelling in your ankles.  You have trouble with your vision. SEEK IMMEDIATE MEDICAL CARE IF:  You develop a severe headache or confusion.  You have unusual weakness, numbness, or feel faint.  You have severe chest or abdominal pain.  You vomit repeatedly.  You have trouble breathing. MAKE SURE YOU:   Understand these instructions.  Will watch your condition.  Will get help right away if you are not doing well or get worse. Document Released: 06/06/2005 Document Revised: 10/21/2013 Document Reviewed: 03/29/2013 ExitCare Patient Information 2015 [employer]. This information is not intended to replace advice given to you by your health care provider. Make sure you discuss any questions you have with your health care provider.  

## 2014-12-25 NOTE — Progress Notes (Signed)
   Subjective:    Patient ID: [name], male    DOB: [birth year], 74 y.o.   MRN: [id number]  Pt presents to the office today to recheck HTN. Pt's BP is at goal today! Hypertension This is a chronic problem. The current episode started more than 1 year ago. The problem has been resolved since onset. The problem is controlled. Pertinent negatives include no anxiety, headaches, palpitations, peripheral edema or shortness of breath. Risk factors for coronary artery disease include dyslipidemia, male gender, smoking/tobacco exposure and family history. Past treatments include ACE inhibitors, calcium channel blockers and beta blockers. The current treatment provides mild improvement. Hypertensive end-organ damage includes CAD/MI and heart failure. There is no history of kidney disease, CVA or a thyroid problem. There is no history of sleep apnea.      Review of Systems  Constitutional: Negative.   HENT: Negative.   Respiratory: Negative.  Negative for shortness of breath.   Cardiovascular: Negative.  Negative for palpitations.  Gastrointestinal: Negative.   Endocrine: Negative.   Genitourinary: Negative.   Musculoskeletal: Negative.   Neurological: Negative.  Negative for headaches.  Hematological: Negative.   Psychiatric/Behavioral: Negative.   All other systems reviewed and are negative.      Objective:   Physical Exam  Constitutional: He is oriented to person, place, and time. He appears well-developed and well-nourished. No distress.  HENT:  Head: Normocephalic.  Right Ear: External ear normal.  Left Ear: External ear normal.  Nose: Nose normal.  Mouth/Throat: Oropharynx is clear and moist.  Eyes: Pupils are equal, round, and reactive to light. Right eye exhibits no discharge. Left eye exhibits no discharge.  Neck: Normal range of motion. Neck supple. No thyromegaly present.  Cardiovascular: Normal rate, regular rhythm, normal heart sounds and intact distal pulses.   No  murmur heard. Pulmonary/Chest: Effort normal and breath sounds normal. No respiratory distress. He has no wheezes.  Abdominal: Soft. Bowel sounds are normal. He exhibits no distension. There is no tenderness.  Musculoskeletal: Normal range of motion. He exhibits no edema or tenderness.  Neurological: He is alert and oriented to person, place, and time. He has normal reflexes. No cranial nerve deficit.  Skin: Skin is warm and dry. No rash noted. No erythema.  Psychiatric: He has a normal mood and affect. His behavior is normal. Judgment and thought content normal.  Vitals reviewed.     BP 134/77 mmHg  Pulse 58  Temp(Src) 97.3 F (36.3 C) (Oral)  Ht _0  (1.727 m)  Wt 135 lb 9.6 oz (61.508 kg)  BMI 20.62 kg/m2     Assessment & Plan:  1. Essential hypertension -Daily blood pressure log given with instructions on how to fill out and told to bring to next visit -Dash diet information given -Exercise encouraged - Stress Management  -Continue current meds -RTO in 6 months - BMP8+EGFR   Continue all meds Labs pending Health Maintenance reviewed Diet and exercise encouraged RTO 6 months  Evelina Dun, FNP

## 2014-12-26 LAB — BMP8+EGFR
BUN/Creatinine Ratio: 13 (ref 10–22)
BUN: 12 mg/dL (ref 8–27)
CHLORIDE: 88 mmol/L — AB (ref 97–108)
CO2: 24 mmol/L (ref 18–29)
Calcium: 9.9 mg/dL (ref 8.6–10.2)
Creatinine, Ser: 0.96 mg/dL (ref 0.76–1.27)
GFR calc non Af Amer: 79 mL/min/{1.73_m2} (ref >59)
GFR, EST AFRICAN AMERICAN: 92 mL/min/{1.73_m2} (ref >59)
GLUCOSE: 133 mg/dL — AB (ref 65–99)
POTASSIUM: 5.2 mmol/L (ref 3.5–5.2)
Sodium: 127 mmol/L — ABNORMAL LOW (ref 134–144)

## 2014-12-29 ENCOUNTER — Other Ambulatory Visit: Payer: Self-pay | Admitting: *Deleted

## 2014-12-29 ENCOUNTER — Other Ambulatory Visit: Payer: Self-pay | Admitting: Family

## 2014-12-29 DIAGNOSIS — E871 Hypo-osmolality and hyponatremia: Secondary | ICD-10-CM

## 2015-03-17 ENCOUNTER — Other Ambulatory Visit: Payer: Self-pay | Admitting: Family

## 2015-03-17 MED ORDER — ALPRAZOLAM 1 MG PO TABS: 1.0000 mg | ORAL_TABLET | Freq: Two times a day (BID) | ORAL | Status: DC | PRN

## 2015-03-17 NOTE — Telephone Encounter (Signed)
Christys pt, last seen 12/25/14, last filled 02/09/15, route to pool, call in at [facility]

## 2015-03-18 NOTE — Telephone Encounter (Signed)
rx called into pharmacy

## 2015-05-18 ENCOUNTER — Telehealth: Payer: Self-pay | Admitting: Family

## 2015-05-18 MED ORDER — ALPRAZOLAM 1 MG PO TABS
1.0000 mg | ORAL_TABLET | Freq: Two times a day (BID) | ORAL | Status: DC | PRN
Start: 2015-05-18 — End: 2015-06-18

## 2015-05-18 NOTE — Telephone Encounter (Signed)
Patient aware and rx called to pharmacy  

## 2015-05-18 NOTE — Telephone Encounter (Signed)
This refill has already been sent. Please call in

## 2015-06-18 ENCOUNTER — Ambulatory Visit (INDEPENDENT_AMBULATORY_CARE_PROVIDER_SITE_OTHER): Payer: Medicare Other | Admitting: Family

## 2015-06-18 ENCOUNTER — Encounter: Payer: Self-pay | Admitting: Family

## 2015-06-18 VITALS — BP 140/77 | HR 70 | Temp 96.9°F | Ht 68.0 in | Wt 137.4 lb

## 2015-06-18 DIAGNOSIS — G4700 Insomnia, unspecified: Secondary | ICD-10-CM | POA: Diagnosis not present

## 2015-06-18 DIAGNOSIS — J309 Allergic rhinitis, unspecified: Secondary | ICD-10-CM | POA: Diagnosis not present

## 2015-06-18 DIAGNOSIS — F411 Generalized anxiety disorder: Secondary | ICD-10-CM

## 2015-06-18 DIAGNOSIS — K219 Gastro-esophageal reflux disease without esophagitis: Secondary | ICD-10-CM | POA: Diagnosis not present

## 2015-06-18 DIAGNOSIS — E785 Hyperlipidemia, unspecified: Secondary | ICD-10-CM

## 2015-06-18 DIAGNOSIS — I25119 Atherosclerotic heart disease of native coronary artery with unspecified angina pectoris: Secondary | ICD-10-CM

## 2015-06-18 DIAGNOSIS — F17200 Nicotine dependence, unspecified, uncomplicated: Secondary | ICD-10-CM | POA: Diagnosis not present

## 2015-06-18 DIAGNOSIS — I10 Essential (primary) hypertension: Secondary | ICD-10-CM

## 2015-06-18 MED ORDER — ALPRAZOLAM 1 MG PO TABS: 1.0000 mg | ORAL_TABLET | Freq: Two times a day (BID) | ORAL | Status: DC | PRN

## 2015-06-18 NOTE — Progress Notes (Signed)
Subjective:    Patient ID: [name], male    DOB: 1940-11-22, 74 y.o.   MRN: [id number]  Pt presents to the office today for chronic follow up.  Hypertension This is a chronic problem. The current episode started more than 1 year ago. The problem has been resolved since onset. The problem is controlled. Associated symptoms include anxiety. Pertinent negatives include no chest pain, headaches, palpitations, peripheral edema or shortness of breath. Risk factors for coronary artery disease include dyslipidemia, male gender, smoking/tobacco exposure, sedentary lifestyle and family history. Past treatments include beta blockers and calcium channel blockers. The current treatment provides mild improvement. Hypertensive end-organ damage includes CAD/MI and heart failure. There is no history of kidney disease, CVA or a thyroid problem. There is no history of sleep apnea.  Hyperlipidemia This is a chronic problem. The current episode started more than 1 year ago. The problem is controlled. Recent lipid tests were reviewed and are normal. He has no history of diabetes or hypothyroidism. Factors aggravating his hyperlipidemia include smoking. Pertinent negatives include no chest pain, leg pain, myalgias or shortness of breath. Current antihyperlipidemic treatment includes statins. The current treatment provides mild improvement of lipids. Risk factors for coronary artery disease include dyslipidemia, family history, hypertension, male sex and a sedentary lifestyle.  Gastroesophageal Reflux He reports no belching, no chest pain, no choking, no heartburn, no sore throat or no wheezing. This is a chronic problem. The current episode started more than 1 year ago. The problem occurs rarely. The symptoms are aggravated by certain foods and smoking. Risk factors include smoking/tobacco exposure. He has tried a PPI for the symptoms. The treatment provided significant relief.  Anxiety Presents for follow-up  visit. Onset was 1 to 6 months ago. The problem has been waxing and waning. Symptoms include excessive worry and nervous/anxious behavior. Patient reports no chest pain, depressed mood, impotence, irritability, palpitations or shortness of breath. Symptoms occur occasionally. The symptoms are aggravated by family issues. The quality of sleep is good.   His past medical history is significant for anxiety/panic attacks and CAD. There is no history of depression. Past treatments include benzodiazephines. The treatment provided moderate relief. Compliance with prior treatments has been good.      Review of Systems  Constitutional: Negative.  Negative for irritability.  HENT: Negative.  Negative for sore throat.   Respiratory: Negative.  Negative for choking, shortness of breath and wheezing.   Cardiovascular: Negative.  Negative for chest pain and palpitations.  Gastrointestinal: Negative.  Negative for heartburn.  Endocrine: Negative.   Genitourinary: Negative.  Negative for impotence.  Musculoskeletal: Negative.  Negative for myalgias.  Neurological: Negative.  Negative for headaches.  Hematological: Negative.   Psychiatric/Behavioral: The patient is nervous/anxious.   All other systems reviewed and are negative.      Objective:   Physical Exam  Constitutional: He is oriented to person, place, and time. He appears well-developed and well-nourished. No distress.  HENT:  Head: Normocephalic.  Right Ear: External ear normal.  Left Ear: External ear normal.  Nose: Nose normal.  Mouth/Throat: Oropharynx is clear and moist.  Eyes: Pupils are equal, round, and reactive to light. Right eye exhibits no discharge. Left eye exhibits no discharge.  Neck: Normal range of motion. Neck supple. No thyromegaly present.  Cardiovascular: Normal rate, regular rhythm, normal heart sounds and intact distal pulses.   No murmur heard. Pulmonary/Chest: Effort normal and breath sounds normal. No respiratory  distress. He has no wheezes.  Abdominal: Soft.  Bowel sounds are normal. He exhibits no distension. There is no tenderness.  Musculoskeletal: Normal range of motion. He exhibits no edema or tenderness.  Neurological: He is alert and oriented to person, place, and time. He has normal reflexes. No cranial nerve deficit.  Skin: Skin is warm and dry. No rash noted. No erythema.  Psychiatric: He has a normal mood and affect. His behavior is normal. Judgment and thought content normal.  Vitals reviewed.     BP 140/77 mmHg  Pulse 70  Temp(Src) 96.9 F (36.1 C) (Oral)  Ht _0  (1.727 m)  Wt 137 lb 6.4 oz (62.324 kg)  BMI 20.90 kg/m2     Assessment & Plan:  1. Essential hypertension - CMP14+EGFR  2. Atherosclerosis of native coronary artery with angina pectoris, unspecified whether native or transplanted heart ([place]) - CMP14+EGFR  3. Allergic rhinitis, unspecified allergic rhinitis type - CMP14+EGFR  4. Gastroesophageal reflux disease, esophagitis presence not specified - CMP14+EGFR  5. TOBACCO ABUSE - CMP14+EGFR  6. GAD (generalized anxiety disorder) - CMP14+EGFR  7. Hyperlipidemia - CMP14+EGFR - Lipid panel  8. Insomnia - CMP14+EGFR   Continue all meds Labs pending Health Maintenance reviewed Diet and exercise encouraged RTO 6 months  Evelina Dun, FNP

## 2015-06-18 NOTE — Patient Instructions (Signed)

## 2015-06-19 ENCOUNTER — Other Ambulatory Visit: Payer: Self-pay | Admitting: Family

## 2015-06-19 DIAGNOSIS — E875 Hyperkalemia: Secondary | ICD-10-CM

## 2015-06-19 LAB — CMP14+EGFR
ALBUMIN: 4.5 g/dL (ref 3.5–4.8)
ALK PHOS: 71 IU/L (ref 39–117)
ALT: 21 IU/L (ref 0–44)
AST: 35 IU/L (ref 0–40)
Albumin/Globulin Ratio: 1.7 (ref 1.1–2.5)
BUN / CREAT RATIO: 12 (ref 10–22)
BUN: 12 mg/dL (ref 8–27)
Bilirubin Total: 0.3 mg/dL (ref 0.0–1.2)
CHLORIDE: 90 mmol/L — AB (ref 96–106)
CO2: 24 mmol/L (ref 18–29)
Calcium: 9.9 mg/dL (ref 8.6–10.2)
Creatinine, Ser: 1.01 mg/dL (ref 0.76–1.27)
GFR calc non Af Amer: 74 mL/min/{1.73_m2} (ref >59)
GFR, EST AFRICAN AMERICAN: 86 mL/min/{1.73_m2} (ref >59)
GLOBULIN, TOTAL: 2.7 g/dL (ref 1.5–4.5)
GLUCOSE: 91 mg/dL (ref 65–99)
Potassium: 5.7 mmol/L — ABNORMAL HIGH (ref 3.5–5.2)
SODIUM: 129 mmol/L — AB (ref 134–144)
TOTAL PROTEIN: 7.2 g/dL (ref 6.0–8.5)

## 2015-06-19 LAB — LIPID PANEL
CHOLESTEROL TOTAL: 224 mg/dL — AB (ref 100–199)
Chol/HDL Ratio: 2.2 ratio units (ref 0.0–5.0)
HDL: 102 mg/dL (ref >39)
LDL CALC: 109 mg/dL — AB (ref 0–99)
Triglycerides: 63 mg/dL (ref 0–149)
VLDL Cholesterol Cal: 13 mg/dL (ref 5–40)

## 2015-06-23 ENCOUNTER — Other Ambulatory Visit (INDEPENDENT_AMBULATORY_CARE_PROVIDER_SITE_OTHER): Payer: Medicare Other

## 2015-06-23 DIAGNOSIS — E875 Hyperkalemia: Secondary | ICD-10-CM

## 2015-06-24 LAB — BMP8+EGFR
BUN/Creatinine Ratio: 10 (ref 10–22)
BUN: 11 mg/dL (ref 8–27)
CO2: 22 mmol/L (ref 18–29)
CREATININE: 1.07 mg/dL (ref 0.76–1.27)
Calcium: 9.6 mg/dL (ref 8.6–10.2)
Chloride: 89 mmol/L — ABNORMAL LOW (ref 96–106)
GFR calc Af Amer: 80 mL/min/{1.73_m2} (ref >59)
GFR calc non Af Amer: 69 mL/min/{1.73_m2} (ref >59)
GLUCOSE: 89 mg/dL (ref 65–99)
Potassium: 5.1 mmol/L (ref 3.5–5.2)
Sodium: 128 mmol/L — ABNORMAL LOW (ref 134–144)

## 2015-08-07 ENCOUNTER — Ambulatory Visit (INDEPENDENT_AMBULATORY_CARE_PROVIDER_SITE_OTHER): Payer: Medicare Other | Admitting: Family

## 2015-08-07 ENCOUNTER — Encounter: Payer: Self-pay | Admitting: Family

## 2015-08-07 VITALS — BP 147/77 | HR 61 | Temp 96.8°F | Ht 68.0 in | Wt 133.8 lb

## 2015-08-07 DIAGNOSIS — L259 Unspecified contact dermatitis, unspecified cause: Secondary | ICD-10-CM

## 2015-08-07 MED ORDER — METHYLPREDNISOLONE ACETATE 80 MG/ML IJ SUSP
80.0000 mg | Freq: Once | INTRAMUSCULAR | Status: AC
  Administered 2015-08-07: 80 mg via INTRAMUSCULAR

## 2015-08-07 MED ORDER — PREDNISONE 10 MG (21) PO TBPK: 10.0000 mg | ORAL_TABLET | Freq: Every day | ORAL | Status: DC

## 2015-08-07 MED ORDER — TRIAMCINOLONE ACETONIDE 0.5 % EX OINT: 1.0000 "application " | TOPICAL_OINTMENT | Freq: Two times a day (BID) | CUTANEOUS | Status: DC

## 2015-08-07 NOTE — Patient Instructions (Signed)
Contact Dermatitis Dermatitis is redness, soreness, and swelling (inflammation) of the skin. Contact dermatitis is a reaction to certain substances that touch the skin. There are two types of contact dermatitis:   Irritant contact dermatitis. This type is caused by something that irritates your skin, such as dry hands from washing them too much. This type does not require previous exposure to the substance for a reaction to occur. This type is more common.  Allergic contact dermatitis. This type is caused by a substance that you are allergic to, such as a nickel allergy or poison ivy. This type only occurs if you have been exposed to the substance (allergen) before. Upon a repeat exposure, your body reacts to the substance. This type is less common. CAUSES  Many different substances can cause contact dermatitis. Irritant contact dermatitis is most commonly caused by exposure to:   Makeup.   Soaps.   Detergents.   Bleaches.   Acids.   Metal salts, such as nickel.  Allergic contact dermatitis is most commonly caused by exposure to:   Poisonous plants.   Chemicals.   Jewelry.   Latex.   Medicines.   Preservatives in products, such as clothing.  RISK FACTORS This condition is more likely to develop in:   People who have jobs that expose them to irritants or allergens.  People who have certain medical conditions, such as asthma or eczema.  SYMPTOMS  Symptoms of this condition may occur anywhere on your body where the irritant has touched you or is touched by you. Symptoms include:  Dryness or flaking.   Redness.   Cracks.   Itching.   Pain or a burning feeling.   Blisters.  Drainage of small amounts of blood or clear fluid from skin cracks. With allergic contact dermatitis, there may also be swelling in areas such as the eyelids, mouth, or genitals.  DIAGNOSIS  This condition is diagnosed with a medical history and physical exam. A patch skin test  may be performed to help determine the cause. If the condition is related to your job, you may need to see an occupational medicine specialist. TREATMENT Treatment for this condition includes figuring out what caused the reaction and protecting your skin from further contact. Treatment may also include:   Steroid creams or ointments. Oral steroid medicines may be needed in more severe cases.  Antibiotics or antibacterial ointments, if a skin infection is present.  Antihistamine lotion or an antihistamine taken by mouth to ease itching.  A bandage (dressing). HOME CARE INSTRUCTIONS Skin Care  Moisturize your skin as needed.   Apply cool compresses to the affected areas.  Try taking a bath with:  Epsom salts. Follow the instructions on the packaging. You can get these at your local pharmacy or grocery store.  Baking soda. Pour a small amount into the bath as directed by your health care provider.  Colloidal oatmeal. Follow the instructions on the packaging. You can get this at your local pharmacy or grocery store.  Try applying baking soda paste to your skin. Stir water into baking soda until it reaches a paste-like consistency.  Do not scratch your skin.  Bathe less frequently, such as every other day.  Bathe in lukewarm water. Avoid using hot water. Medicines  Take or apply over-the-counter and prescription medicines only as told by your health care provider.   If you were prescribed an antibiotic medicine, take or apply your antibiotic as told by your health care provider. Do not stop using the   antibiotic even if your condition starts to improve. General Instructions  Keep all follow-up visits as told by your health care provider. This is important.  Avoid the substance that caused your reaction. If you do not know what caused it, keep a journal to try to track what caused it. Write down:  What you eat.  What cosmetic products you use.  What you drink.  What  you wear in the affected area. This includes jewelry.  If you were given a dressing, take care of it as told by your health care provider. This includes when to change and remove it. SEEK MEDICAL CARE IF:   Your condition does not improve with treatment.  Your condition gets worse.  You have signs of infection such as swelling, tenderness, redness, soreness, or warmth in the affected area.  You have a fever.  You have new symptoms. SEEK IMMEDIATE MEDICAL CARE IF:   You have a severe headache, neck pain, or neck stiffness.  You vomit.  You feel very sleepy.  You notice red streaks coming from the affected area.  Your bone or joint underneath the affected area becomes painful after the skin has healed.  The affected area turns darker.  You have difficulty breathing.   This information is not intended to replace advice given to you by your health care provider. Make sure you discuss any questions you have with your health care provider.   Document Released: 06/03/2000 Document Revised: 02/25/2015 Document Reviewed: 10/22/2014 Elsevier Interactive Patient Education 2016 [employer]  

## 2015-08-07 NOTE — Progress Notes (Addendum)
   Subjective:    Patient ID: [name], male    DOB: 06-Dec-1940, 75 y.o.   MRN: [id number]   Rash This is a new problem. The current episode started 1 to 4 weeks ago. The problem is unchanged. The affected locations include the neck, left arm and right arm. The rash is characterized by dryness, redness and swelling. He was exposed to nothing. Pertinent negatives include no congestion, cough, diarrhea, fever or sore throat. Past treatments include anti-itch cream. The treatment provided mild relief.      Review of Systems  Constitutional: Negative.  Negative for fever.  HENT: Negative.  Negative for congestion and sore throat.   Respiratory: Negative.  Negative for cough.   Cardiovascular: Negative.   Gastrointestinal: Negative.  Negative for diarrhea.  Endocrine: Negative.   Genitourinary: Negative.   Musculoskeletal: Negative.   Skin: Positive for rash.  Neurological: Negative.   Hematological: Negative.   Psychiatric/Behavioral: Negative.   All other systems reviewed and are negative.      Objective:   Physical Exam  Constitutional: He is oriented to person, place, and time. He appears well-developed and well-nourished. No distress.  HENT:  Head: Normocephalic.  Eyes: Pupils are equal, round, and reactive to light. Right eye exhibits no discharge. Left eye exhibits no discharge.  Neck: Normal range of motion. Neck supple. No thyromegaly present.  Cardiovascular: Normal rate, regular rhythm, normal heart sounds and intact distal pulses.   No murmur heard. Pulmonary/Chest: Effort normal and breath sounds normal. No respiratory distress. He has no wheezes.  Abdominal: Soft. Bowel sounds are normal. He exhibits no distension. There is no tenderness.  Musculoskeletal: Normal range of motion. He exhibits no edema or tenderness.  Neurological: He is alert and oriented to person, place, and time. He has normal reflexes. No cranial nerve deficit.  Skin: Skin is warm and dry.  No rash noted. There is erythema.  Erythemas rash on bilateral lower arms (just below sleeve line) and neck (just above collar of shirt).   Psychiatric: He has a normal mood and affect. His behavior is normal. Judgment and thought content normal.  Vitals reviewed.     BP 147/77 mmHg  Pulse 61  Temp(Src) 96.8 F (36 C) (Oral)  Ht 5\' 8"  (1.727 m)  Wt 133 lb 12.8 oz (60.691 kg)  BMI 20.35 kg/m2     Assessment & Plan:  1. Contact dermatitis Do not scratch -Avoid allergens when possible -Cool compresses  -Pt told to keep journey of possible allergens- I do not believe this is related to soap or detergents since the rash is outlined outside of shirt -RTO in 2 weks - methylPREDNISolone acetate (DEPO-MEDROL) injection 80 mg; Inject 1 mL (80 mg total) into the muscle once. - triamcinolone ointment (KENALOG) 0.5 %; Apply 1 application topically 2 (two) times daily.  Dispense: 60 g; Refill: 1 - predniSONE (STERAPRED UNI-PAK 21 TAB) 10 MG (21) TBPK tablet; Take 1 tablet (10 mg total) by mouth daily. As directed x 6 days  Dispense: 21 tablet; Refill: 0  Evelina Dun, FNP

## 2015-08-19 ENCOUNTER — Encounter: Payer: Self-pay | Admitting: *Deleted

## 2015-08-22 ENCOUNTER — Other Ambulatory Visit: Payer: Self-pay | Admitting: Family Medicine

## 2015-09-24 ENCOUNTER — Encounter: Payer: Self-pay | Admitting: Family

## 2015-09-24 ENCOUNTER — Ambulatory Visit (INDEPENDENT_AMBULATORY_CARE_PROVIDER_SITE_OTHER): Payer: Medicare Other | Admitting: Family

## 2015-09-24 VITALS — BP 136/84 | HR 74 | Temp 97.0°F | Ht 68.0 in | Wt 131.2 lb

## 2015-09-24 DIAGNOSIS — W57XXXA Bitten or stung by nonvenomous insect and other nonvenomous arthropods, initial encounter: Secondary | ICD-10-CM

## 2015-09-24 DIAGNOSIS — Z23 Encounter for immunization: Secondary | ICD-10-CM | POA: Diagnosis not present

## 2015-09-24 DIAGNOSIS — T148 Other injury of unspecified body region: Secondary | ICD-10-CM

## 2015-09-24 MED ORDER — TRIAMCINOLONE ACETONIDE 0.5 % EX OINT: 1.0000 "application " | TOPICAL_OINTMENT | Freq: Two times a day (BID) | CUTANEOUS | Status: DC

## 2015-09-24 MED ORDER — DOXYCYCLINE HYCLATE 100 MG PO TABS: 100.0000 mg | ORAL_TABLET | Freq: Two times a day (BID) | ORAL | Status: DC

## 2015-09-24 NOTE — Patient Instructions (Signed)
Insect Bite Mosquitoes, flies, fleas, bedbugs, and many other insects can bite. Insect bites are different from insect stings. A sting is when poison (venom) is injected into the skin. Insect bites can cause pain or itching for a few days, but they are usually not serious. Some insects can spread diseases to people through a bite. SYMPTOMS  Symptoms of an insect bite include:  Itching or pain in the bite area.  Redness and swelling in the bite area.  An open wound (skin ulcer). In many cases, symptoms last for 2-4 days.  DIAGNOSIS  This condition is usually diagnosed based on symptoms and a physical exam. TREATMENT  Treatment is usually not needed for an insect bite. Symptoms often go away on their own. Your health care provider may recommend creams or lotions to help reduce itching. Antibiotic medicines may be prescribed if the bite becomes infected. A tetanus shot may be given in some cases. If you develop an allergic reaction to an insect bite, your health care provider will prescribe medicines to treat the reaction (antihistamines). This is rare. HOME CARE INSTRUCTIONS  Do not scratch the bite area.  Keep the bite area clean and dry. Wash the bite area daily with soap and water as told by your health care provider.  If directed, applyice to the bite area.  Put ice in a plastic bag.  Place a towel between your skin and the bag.  Leave the ice on for 20 minutes, 2-3 times per day.  To help reduce itching and swelling, try applying a baking soda paste, cortisone cream, or calamine lotion to the bite area as told by your health care provider.  Apply or take over-the-counter and prescription medicines only as told by your health care provider.  If you were prescribed an antibiotic medicine, use it as told by your health care provider. Do not stop using the antibiotic even if your condition improves.  Keep all follow-up visits as told by your health care provider. This is  important. PREVENTION   Use insect repellent. The best insect repellents contain:  DEET, picaridin, oil of lemon eucalyptus (OLE), or IR3535.  Higher amounts of an active ingredient.  When you are outdoors, wear clothing that covers your arms and legs.  Avoid opening windows that do not have window screens. SEEK MEDICAL CARE IF:  You have increased redness, swelling, or pain in the bite area.  You have a fever. SEEK IMMEDIATE MEDICAL CARE IF:   You have joint pain.   You have fluid, blood, or pus coming from the bite area.  You have a headache or neck pain.  You have unusual weakness.  You have a rash.  You have chest pain or shortness of breath.  You have abdominal pain, nausea, or vomiting.  You feel unusually tired or sleepy.   This information is not intended to replace advice given to you by your health care provider. Make sure you discuss any questions you have with your health care provider.   Document Released: 07/14/2004 Document Revised: 02/25/2015 Document Reviewed: 10/22/2014 Elsevier Interactive Patient Education 2016 [employer] Inc.  

## 2015-09-24 NOTE — Addendum Note (Signed)
Addended by: Shelbie Ammons on: 09/24/2015 12:23 PM   Modules accepted: Orders

## 2015-09-24 NOTE — Progress Notes (Signed)
   Subjective:    Patient ID: [name], male    DOB: [birth year], 75 y.o.   MRN: [id number]  Rash This is a recurrent problem. The current episode started more than 1 month ago. The problem has been waxing and waning since onset. The affected locations include the back. The rash is characterized by dryness, itchiness and redness. He was exposed to nothing. Pertinent negatives include no congestion, cough, fever, joint pain, shortness of breath or sore throat. Past treatments include anti-itch cream, oral steroids and topical steroids. The treatment provided mild relief. There is no history of allergies.      Review of Systems  Constitutional: Negative.  Negative for fever.  HENT: Negative.  Negative for congestion and sore throat.   Respiratory: Negative.  Negative for cough and shortness of breath.   Cardiovascular: Negative.   Gastrointestinal: Negative.   Endocrine: Negative.   Genitourinary: Negative.   Musculoskeletal: Negative.  Negative for joint pain.  Skin: Positive for rash.  Neurological: Negative.   Hematological: Negative.   Psychiatric/Behavioral: Negative.   All other systems reviewed and are negative.      Objective:   Physical Exam  Constitutional: He is oriented to person, place, and time. He appears well-developed and well-nourished. No distress.  HENT:  Head: Normocephalic.  Eyes: Pupils are equal, round, and reactive to light. Right eye exhibits no discharge. Left eye exhibits no discharge.  Neck: Normal range of motion. Neck supple. No thyromegaly present.  Cardiovascular: Normal rate, regular rhythm, normal heart sounds and intact distal pulses.   No murmur heard. Pulmonary/Chest: Effort normal and breath sounds normal. No respiratory distress. He has no wheezes.  Musculoskeletal: Normal range of motion. He exhibits no edema or tenderness.  Neurological: He is alert and oriented to person, place, and time.  Skin: Skin is warm and dry. No rash  noted. No erythema.  Psychiatric: He has a normal mood and affect. His behavior is normal. Judgment and thought content normal.  Vitals reviewed.   BP 136/84 mmHg  Pulse 74  Temp(Src) 97 F (36.1 C) (Oral)  Ht 5\' 8"  (1.727 m)  Wt 131 lb 3.2 oz (59.512 kg)  BMI 19.95 kg/m2       Assessment & Plan:  1. Insect bite -Keep clean and dry -Do not scratch or pick at -If redness or warmth increases RTO -RTO prn  - doxycycline (VIBRA-TABS) 100 MG tablet; Take 1 tablet (100 mg total) by mouth 2 (two) times daily.  Dispense: 20 tablet; Refill: 0 - triamcinolone ointment (KENALOG) 0.5 %; Apply 1 application topically 2 (two) times daily.  Dispense: 60 g; Refill: [place], FNP

## 2015-10-06 ENCOUNTER — Other Ambulatory Visit: Payer: Self-pay | Admitting: Family

## 2015-10-16 ENCOUNTER — Encounter: Payer: Self-pay | Admitting: Family Medicine

## 2015-10-16 ENCOUNTER — Ambulatory Visit (INDEPENDENT_AMBULATORY_CARE_PROVIDER_SITE_OTHER): Payer: Medicare Other | Admitting: Family Medicine

## 2015-10-16 VITALS — BP 123/71 | HR 61 | Temp 97.3°F | Ht 68.0 in | Wt 131.0 lb

## 2015-10-16 DIAGNOSIS — Z01818 Encounter for other preprocedural examination: Secondary | ICD-10-CM | POA: Diagnosis not present

## 2015-10-16 LAB — CBC WITH DIFFERENTIAL/PLATELET
BASOS: 0 %
Basophils Absolute: 0.1 10*3/uL (ref 0.0–0.2)
EOS (ABSOLUTE): 0.1 10*3/uL (ref 0.0–0.4)
EOS: 1 %
Hematocrit: 34.5 % — ABNORMAL LOW (ref 37.5–51.0)
Hemoglobin: 11.6 g/dL — ABNORMAL LOW (ref 12.6–17.7)
IMMATURE GRANS (ABS): 0 10*3/uL (ref 0.0–0.1)
IMMATURE GRANULOCYTES: 0 %
Lymphocytes Absolute: 1.6 10*3/uL (ref 0.7–3.1)
Lymphs: 12 %
MCH: 31.2 pg (ref 26.6–33.0)
MCHC: 33.6 g/dL (ref 31.5–35.7)
MCV: 93 fL (ref 79–97)
Monocytes Absolute: 1.6 10*3/uL — ABNORMAL HIGH (ref 0.1–0.9)
Monocytes: 13 %
NEUTROS PCT: 74 %
Neutrophils Absolute: 9.2 10*3/uL — ABNORMAL HIGH (ref 1.4–7.0)
PLATELETS: 369 10*3/uL (ref 150–379)
RBC: 3.72 x10E6/uL — ABNORMAL LOW (ref 4.14–5.80)
RDW: 13.6 % (ref 12.3–15.4)
WBC: 12.6 10*3/uL — ABNORMAL HIGH (ref 3.4–10.8)

## 2015-10-16 LAB — BMP8+EGFR
BUN/Creatinine Ratio: 14 (ref 10–24)
BUN: 14 mg/dL (ref 8–27)
CALCIUM: 9.6 mg/dL (ref 8.6–10.2)
CO2: 23 mmol/L (ref 18–29)
CREATININE: 0.97 mg/dL (ref 0.76–1.27)
Chloride: 88 mmol/L — ABNORMAL LOW (ref 96–106)
GFR, EST AFRICAN AMERICAN: 90 mL/min/{1.73_m2} (ref >59)
GFR, EST NON AFRICAN AMERICAN: 78 mL/min/{1.73_m2} (ref >59)
GLUCOSE: 98 mg/dL (ref 65–99)
POTASSIUM: 4.7 mmol/L (ref 3.5–5.2)
Sodium: 128 mmol/L — ABNORMAL LOW (ref 134–144)

## 2015-10-16 NOTE — Progress Notes (Signed)
BP 123/71 mmHg  Pulse 61  Temp(Src) 97.3 F (36.3 C) (Oral)  Ht 5' 8" (1.727 m)  Wt 131 lb (59.421 kg)  BMI 19.92 kg/m2   Subjective:    Patient ID: [name], male    DOB: 1941/06/08, 75 y.o.   MRN: [id number]  HPI: [name] is a 75 y.o. male presenting on 10/16/2015 for Surgical Clearance   HPI Surgical clearance Patient is coming in today for surgical clearance for cataract surgery which is a low risk minor surgery. We are going to run a blood count and chem panel just to make sure his levels are good. He denies any shortness of breath or chest pain or shortness of breath with ambulation. He is a current smoker and smokes about a quarter of a pack a day. He denies any chest pain, shortness of breath, headaches, abdominal complaints, diarrhea, nausea, vomiting, or joint issues. He is also going to see his cardiologist this coming week to have his pacemaker looked at to make sure it is working well. He regularly gets his pacemaker interrogated.  Relevant past medical, surgical, family and social history reviewed and updated as indicated. Interim medical history since our last visit reviewed. Allergies and medications reviewed and updated.  Review of Systems  Constitutional: Negative for fever.  HENT: Negative for ear discharge and ear pain.   Eyes: Negative for discharge and visual disturbance.  Respiratory: Negative for cough, shortness of breath and wheezing.   Cardiovascular: Negative for chest pain, palpitations and leg swelling.  Gastrointestinal: Negative for abdominal pain, diarrhea and constipation.  Genitourinary: Negative for difficulty urinating.  Musculoskeletal: Negative for back pain and gait problem.  Skin: Negative for rash.  Neurological: Negative for dizziness, syncope, light-headedness and headaches.  All other systems reviewed and are negative.   Per HPI unless specifically indicated above     Medication List       This list is  accurate as of: 10/16/15 11:55 AM.  Always use your most recent med list.               ALPRAZolam 1 MG tablet  Commonly known as:  XANAX  Take 1 tablet (1 mg total) by mouth 2 (two) times daily as needed. for anxiety     amLODipine 10 MG tablet  Commonly known as:  NORVASC  Take 1 tablet (10 mg total) by mouth daily.     aspirin 81 MG tablet  Take 81 mg by mouth daily.     cetirizine 10 MG tablet  Commonly known as:  ZYRTEC  Take 10 mg by mouth daily as needed for allergies.     doxycycline 100 MG tablet  Commonly known as:  VIBRA-TABS  Take 1 tablet (100 mg total) by mouth 2 (two) times daily.     lisinopril 40 MG tablet  Commonly known as:  PRINIVIL,ZESTRIL  Take 1 tablet (40 mg total) by mouth daily.     metoprolol 100 MG tablet  Commonly known as:  LOPRESSOR  TAKE ONE TABLET BY MOUTH TWICE DAILY FOR HIGH BLOOD PRESSURE CONTROL     omeprazole 20 MG capsule  Commonly known as:  PRILOSEC  Take 1 capsule (20 mg total) by mouth 2 (two) times daily as needed (for acid reflux).     pravastatin 40 MG tablet  Commonly known as:  PRAVACHOL  TAKE ONE TABLET BY MOUTH ONCE DAILY WITH BREAKFAST     triamcinolone ointment 0.5 %  Commonly known as:  KENALOG  Apply 1 application topically 2 (two) times daily.           Objective:    BP 123/71 mmHg  Pulse 61  Temp(Src) 97.3 F (36.3 C) (Oral)  Ht 5' 8" (1.727 m)  Wt 131 lb (59.421 kg)  BMI 19.92 kg/m2  Wt Readings from Last 3 Encounters:  10/16/15 131 lb (59.421 kg)  09/24/15 131 lb 3.2 oz (59.512 kg)  08/07/15 133 lb 12.8 oz (60.691 kg)    Physical Exam  Constitutional: He is oriented to person, place, and time. He appears well-developed and well-nourished. No distress.  HENT:  Right Ear: External ear normal.  Left Ear: External ear normal.  Nose: Nose normal.  Mouth/Throat: Oropharynx is clear and moist. No oropharyngeal exudate.  Eyes: Conjunctivae and EOM are normal. Pupils are equal, round, and reactive to  light. Right eye exhibits no discharge. No scleral icterus.  Neck: Neck supple. No thyromegaly present.  Cardiovascular: Normal rate, regular rhythm, normal heart sounds and intact distal pulses.   No murmur heard. Pulmonary/Chest: Effort normal and breath sounds normal. No respiratory distress. He has no wheezes.  Musculoskeletal: Normal range of motion. He exhibits no edema.  Lymphadenopathy:    He has no cervical adenopathy.  Neurological: He is alert and oriented to person, place, and time. Coordination normal.  Skin: Skin is warm and dry. No rash noted. He is not diaphoretic.  Psychiatric: He has a normal mood and affect. His behavior is normal.  Vitals reviewed.     Assessment & Plan:   Problem List Items Addressed This Visit    None    Visit Diagnoses    Preoperative clearance    -  Primary    Relevant Orders    BMP8+EGFR    CBC with Differential/Platelet       Follow up plan: Return if symptoms worsen or fail to improve.  Counseling provided for all of the vaccine components Orders Placed This Encounter  Procedures  . BMP8+EGFR  . CBC with Differential/Platelet    Caryl Pina, MD [facility] 10/16/2015, 11:55 AM

## 2015-10-21 ENCOUNTER — Ambulatory Visit (INDEPENDENT_AMBULATORY_CARE_PROVIDER_SITE_OTHER): Payer: Medicare Other | Admitting: Internal Medicine

## 2015-10-21 ENCOUNTER — Encounter: Payer: Self-pay | Admitting: Internal Medicine

## 2015-10-21 VITALS — BP 120/60 | HR 67 | Ht 68.0 in | Wt 133.6 lb

## 2015-10-21 DIAGNOSIS — I5022 Chronic systolic (congestive) heart failure: Secondary | ICD-10-CM | POA: Diagnosis not present

## 2015-10-21 LAB — CUP PACEART INCLINIC DEVICE CHECK
Brady Statistic RV Percent Paced: 100 %
Date Time Interrogation Session: 20170503040000
HighPow Impedance: 63 Ohm
Implantable Lead Implant Date: 20040312
Implantable Lead Implant Date: 20040312
Implantable Lead Model: 4513
Implantable Lead Serial Number: 407264
Lead Channel Impedance Value: 613 Ohm
Lead Channel Pacing Threshold Amplitude: 0.8 V
Lead Channel Pacing Threshold Amplitude: 2 V
Lead Channel Sensing Intrinsic Amplitude: 4.2 mV
Lead Channel Setting Pacing Amplitude: 2.5 V
Lead Channel Setting Pacing Amplitude: 2.8 V
Lead Channel Setting Pacing Pulse Width: 0.4 ms
Lead Channel Setting Pacing Pulse Width: 0.8 ms
MDC IDC LEAD IMPLANT DT: 20040312
MDC IDC LEAD LOCATION: 753858
MDC IDC LEAD LOCATION: 753859
MDC IDC LEAD LOCATION: 753860
MDC IDC LEAD MODEL: 158
MDC IDC LEAD SERIAL: 129158
MDC IDC LEAD SERIAL: 209446
MDC IDC MSMT LEADCHNL LV IMPEDANCE VALUE: 723 Ohm
MDC IDC MSMT LEADCHNL LV PACING THRESHOLD PULSEWIDTH: 0.8 ms
MDC IDC MSMT LEADCHNL LV SENSING INTR AMPL: 23.5 mV
MDC IDC MSMT LEADCHNL RA IMPEDANCE VALUE: 592 Ohm
MDC IDC MSMT LEADCHNL RA PACING THRESHOLD PULSEWIDTH: 0.4 ms
MDC IDC MSMT LEADCHNL RV PACING THRESHOLD AMPLITUDE: 0.6 V
MDC IDC MSMT LEADCHNL RV PACING THRESHOLD PULSEWIDTH: 0.4 ms
MDC IDC MSMT LEADCHNL RV SENSING INTR AMPL: 25 mV — AB
MDC IDC SET LEADCHNL LV SENSING SENSITIVITY: 1 mV
MDC IDC SET LEADCHNL RA PACING AMPLITUDE: 2 V
MDC IDC SET LEADCHNL RV SENSING SENSITIVITY: 0.6 mV
MDC IDC STAT BRADY RA PERCENT PACED: 1 % — AB
Pulse Gen Serial Number: 106298

## 2015-10-21 NOTE — Patient Instructions (Signed)
Medication Instructions:  Your physician recommends that you continue on your current medications as directed. Please refer to the Current Medication list given to you today.   Labwork: None ordered   Testing/Procedures: None ordered   Follow-Up: Your physician wants you to follow-up in: 12 months with Dr Rayann Heman Dennis Bast will receive a reminder letter in the mail two months in advance. If you don't receive a letter, please call our office to schedule the follow-up appointment.  Remote monitoring is used to monitor your  ICD from home. This monitoring reduces the number of office visits required to check your device to one time per year. It allows [place] to keep an eye on the functioning of your device to ensure it is working properly. You are scheduled for a device check from home on 01/20/16. You may send your transmission at any time that day. If you have a wireless device, the transmission will be sent automatically. After your physician reviews your transmission, you will receive a postcard with your next transmission date.    Any Other Special Instructions Will Be Listed Below (If Applicable).     If you need a refill on your cardiac medications before your next appointment, please call your pharmacy.

## 2015-10-21 NOTE — Progress Notes (Signed)
Electrophysiology Office Note   Date:  10/21/2015   ID:  [name], [name] 1940-11-04, MRN [id number]  PCP:  Evelina Dun, FNP  Cardiologist:  Dr Percival Spanish Primary Electrophysiologist: [name], MD    Chief Complaint  Patient presents with  . Congestive Heart Failure     History of Present Illness: [name] is a 75 y.o. male who presents today for electrophysiology evaluation.   He has done well since his last visit.  Chf is very stable. BP is controlled.  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  He is quite passionate about this. Today, he denies symptoms of palpitations, chest pain, shortness of breath, orthopnea, PND, lower extremity edema, claudication, dizziness, presyncope, syncope, bleeding, or neurologic sequela. The patient is tolerating medications without difficulties and is otherwise without complaint today.    Past Medical History  Diagnosis Date  . Nonischemic cardiomyopathy ([place])     EF was 20% (60% last echo 2012)  . Tobacco abuse   . Carotid artery disease ([place])   . EtOH dependence ([place])   . Peptic ulcer disease   . Dyslipidemia   . CAD (coronary artery disease)     Nonobstructive cath 2003  . Hypertension   . Hyperlipidemia   . Insomnia   . Depression   . Pacemaker     and defibrillator  . Arthritis   . COPD (chronic obstructive pulmonary disease) [facility])    Past Surgical History  Procedure Laterality Date  . Ulcer surgery    . Biventricular defibrillator implantation      [employer]  . Coronary stent placement    . Biv icd genertaor change out N/A 03/06/2014    [employer] Gen change by Dr Rayann Heman Beckie Salts CRT-D with LV1 header)     Current Outpatient Prescriptions  Medication Sig Dispense Refill  . ALPRAZolam (XANAX) 1 MG tablet Take 1 tablet (1 mg total) by mouth 2 (two) times daily as needed. for anxiety 60 tablet 3  . amLODipine (NORVASC) 10 MG tablet Take 1 tablet (10 mg total) by mouth daily. 90  tablet 3  . aspirin 81 MG tablet Take 81 mg by mouth daily.    . cetirizine (ZYRTEC) 10 MG tablet Take 10 mg by mouth daily as needed for allergies.    Marland Kitchen doxycycline (VIBRA-TABS) 100 MG tablet Take 1 tablet (100 mg total) by mouth 2 (two) times daily. 20 tablet 0  . lisinopril (PRINIVIL,ZESTRIL) 40 MG tablet Take 1 tablet (40 mg total) by mouth daily. 90 tablet 3  . metoprolol (LOPRESSOR) 100 MG tablet TAKE ONE TABLET BY MOUTH TWICE DAILY FOR HIGH BLOOD PRESSURE CONTROL 180 tablet 1  . omeprazole (PRILOSEC) 20 MG capsule Take 1 capsule (20 mg total) by mouth 2 (two) times daily as needed (for acid reflux). 180 capsule 2  . pravastatin (PRAVACHOL) 40 MG tablet TAKE ONE TABLET BY MOUTH ONCE DAILY WITH BREAKFAST 90 tablet 0  . triamcinolone ointment (KENALOG) 0.5 % Apply 1 application topically 2 (two) times daily. 60 g 1   No current facility-administered medications for this visit.    Allergies:   Sudafed and Avelox   Social History:  The patient  reports that he has been smoking Cigarettes.  He has a 11.25 pack-year smoking history. He quit smokeless tobacco use about 26 years ago. His smokeless tobacco use included Chew. He reports that he does not drink alcohol or use illicit drugs.   Family History:  The patient's family  history includes Colon polyps in his sister.    ROS:  Please see the history of present illness.   All other systems are reviewed and negative.    PHYSICAL EXAM: VS:  BP 120/60 mmHg  Pulse 67  Ht 5\' 8"  (1.727 m)  Wt 133 lb 9.6 oz (60.601 kg)  BMI 20.32 kg/m2 , BMI Body mass index is 20.32 kg/(m^2). GEN: Well nourished, well developed, in no acute distress HEENT: normal Neck: no JVD, carotid bruits, or masses Cardiac: RRR; no murmurs, rubs, or gallops,no edema  Respiratory:  clear to auscultation bilaterally, normal work of breathing GI: soft, nontender, nondistended, + BS MS: no deformity or atrophy Skin: warm and dry, device pocket is well healed Neuro:   Strength and sensation are intact Psych: euthymic mood, full affect  Device interrogation is reviewed today in detail.  See PaceArt for details.   Recent Labs: 11/03/2014: Hemoglobin 12.8* 06/18/2015: ALT 21 10/16/2015: BUN 14; Creatinine, Ser 0.97; Platelets 369; Potassium 4.7; Sodium 128*    Lipid Panel     Component Value Date/Time   CHOL 224* 06/18/2015 0943   CHOL CANCELED 01/31/2014 1618   TRIG 63 06/18/2015 0943   TRIG CANCELED 01/31/2014 1618   HDL 102 06/18/2015 0943   HDL CANCELED 01/31/2014 1618   CHOLHDL 2.2 06/18/2015 0943   LDLCALC 109* 06/18/2015 0943    ekg today reveals sinus with BiV pacing  Wt Readings from Last 3 Encounters:  10/21/15 133 lb 9.6 oz (60.601 kg)  10/16/15 131 lb (59.421 kg)  09/24/15 131 lb 3.2 oz (59.512 kg)     ASSESSMENT AND PLAN:  1. Nonischemic CM- responded to CRT with normalization of his EF Normal BiV ICD function See Pace Art report No changes today  2. Hypertensive cardiovascular disease Stable No change required today   Will follow device remotely with [place] Return to see EP NP in 1 year   Current medicines are reviewed at length with the patient today.   The patient does not have concerns regarding his medicines.  The following changes were made today:  none   Signed, [name], MD  10/21/2015 10:59 AM     [facility] [address] [place] [address] [phone] (office) [phone] (fax)

## 2015-10-30 DIAGNOSIS — H52223 Regular astigmatism, bilateral: Secondary | ICD-10-CM | POA: Diagnosis not present

## 2015-10-30 DIAGNOSIS — H25813 Combined forms of age-related cataract, bilateral: Secondary | ICD-10-CM | POA: Diagnosis not present

## 2015-10-30 DIAGNOSIS — H5203 Hypermetropia, bilateral: Secondary | ICD-10-CM | POA: Diagnosis not present

## 2015-10-30 DIAGNOSIS — H35341 Macular cyst, hole, or pseudohole, right eye: Secondary | ICD-10-CM | POA: Diagnosis not present

## 2015-11-05 ENCOUNTER — Encounter: Payer: Self-pay | Admitting: Family

## 2015-11-05 ENCOUNTER — Ambulatory Visit (INDEPENDENT_AMBULATORY_CARE_PROVIDER_SITE_OTHER): Payer: Medicare Other | Admitting: Family

## 2015-11-05 VITALS — BP 127/70 | HR 60 | Temp 96.6°F | Ht 68.0 in | Wt 135.0 lb

## 2015-11-05 DIAGNOSIS — L259 Unspecified contact dermatitis, unspecified cause: Secondary | ICD-10-CM

## 2015-11-05 MED ORDER — CLOBETASOL PROP EMOLLIENT BASE 0.05 % EX CREA: TOPICAL_CREAM | CUTANEOUS | Status: DC

## 2015-11-05 NOTE — Patient Instructions (Signed)
Contact Dermatitis Dermatitis is redness, soreness, and swelling (inflammation) of the skin. Contact dermatitis is a reaction to certain substances that touch the skin. There are two types of contact dermatitis:   Irritant contact dermatitis. This type is caused by something that irritates your skin, such as dry hands from washing them too much. This type does not require previous exposure to the substance for a reaction to occur. This type is more common.  Allergic contact dermatitis. This type is caused by a substance that you are allergic to, such as a nickel allergy or poison ivy. This type only occurs if you have been exposed to the substance (allergen) before. Upon a repeat exposure, your body reacts to the substance. This type is less common. CAUSES  Many different substances can cause contact dermatitis. Irritant contact dermatitis is most commonly caused by exposure to:   Makeup.   Soaps.   Detergents.   Bleaches.   Acids.   Metal salts, such as nickel.  Allergic contact dermatitis is most commonly caused by exposure to:   Poisonous plants.   Chemicals.   Jewelry.   Latex.   Medicines.   Preservatives in products, such as clothing.  RISK FACTORS This condition is more likely to develop in:   People who have jobs that expose them to irritants or allergens.  People who have certain medical conditions, such as asthma or eczema.  SYMPTOMS  Symptoms of this condition may occur anywhere on your body where the irritant has touched you or is touched by you. Symptoms include:  Dryness or flaking.   Redness.   Cracks.   Itching.   Pain or a burning feeling.   Blisters.  Drainage of small amounts of blood or clear fluid from skin cracks. With allergic contact dermatitis, there may also be swelling in areas such as the eyelids, mouth, or genitals.  DIAGNOSIS  This condition is diagnosed with a medical history and physical exam. A patch skin test  may be performed to help determine the cause. If the condition is related to your job, you may need to see an occupational medicine specialist. TREATMENT Treatment for this condition includes figuring out what caused the reaction and protecting your skin from further contact. Treatment may also include:   Steroid creams or ointments. Oral steroid medicines may be needed in more severe cases.  Antibiotics or antibacterial ointments, if a skin infection is present.  Antihistamine lotion or an antihistamine taken by mouth to ease itching.  A bandage (dressing). HOME CARE INSTRUCTIONS Skin Care  Moisturize your skin as needed.   Apply cool compresses to the affected areas.  Try taking a bath with:  Epsom salts. Follow the instructions on the packaging. You can get these at your local pharmacy or grocery store.  Baking soda. Pour a small amount into the bath as directed by your health care provider.  Colloidal oatmeal. Follow the instructions on the packaging. You can get this at your local pharmacy or grocery store.  Try applying baking soda paste to your skin. Stir water into baking soda until it reaches a paste-like consistency.  Do not scratch your skin.  Bathe less frequently, such as every other day.  Bathe in lukewarm water. Avoid using hot water. Medicines  Take or apply over-the-counter and prescription medicines only as told by your health care provider.   If you were prescribed an antibiotic medicine, take or apply your antibiotic as told by your health care provider. Do not stop using the   antibiotic even if your condition starts to improve. General Instructions  Keep all follow-up visits as told by your health care provider. This is important.  Avoid the substance that caused your reaction. If you do not know what caused it, keep a journal to try to track what caused it. Write down:  What you eat.  What cosmetic products you use.  What you drink.  What  you wear in the affected area. This includes jewelry.  If you were given a dressing, take care of it as told by your health care provider. This includes when to change and remove it. SEEK MEDICAL CARE IF:   Your condition does not improve with treatment.  Your condition gets worse.  You have signs of infection such as swelling, tenderness, redness, soreness, or warmth in the affected area.  You have a fever.  You have new symptoms. SEEK IMMEDIATE MEDICAL CARE IF:   You have a severe headache, neck pain, or neck stiffness.  You vomit.  You feel very sleepy.  You notice red streaks coming from the affected area.  Your bone or joint underneath the affected area becomes painful after the skin has healed.  The affected area turns darker.  You have difficulty breathing.   This information is not intended to replace advice given to you by your health care provider. Make sure you discuss any questions you have with your health care provider.   Document Released: 06/03/2000 Document Revised: 02/25/2015 Document Reviewed: 10/22/2014 Elsevier Interactive Patient Education 2016 [employer]  

## 2015-11-05 NOTE — Progress Notes (Signed)
   Subjective:    Patient ID: [name], male    DOB: [birth year], 75 y.o.   MRN: [id number]  Rash This is a recurrent problem. The current episode started more than 1 month ago. The problem is unchanged. The affected locations include the chest, back and left arm. The rash is characterized by itchiness, dryness, redness and scaling. He was exposed to nothing. Pertinent negatives include no congestion, cough, fatigue, joint pain, shortness of breath or sore throat. Past treatments include anti-itch cream and topical steroids. The treatment provided mild relief.      Review of Systems  Constitutional: Negative for fatigue.  HENT: Negative for congestion and sore throat.   Respiratory: Negative for cough and shortness of breath.   Musculoskeletal: Negative for joint pain.  Skin: Positive for rash.  All other systems reviewed and are negative.      Objective:   Physical Exam  Constitutional: He is oriented to person, place, and time. He appears well-developed and well-nourished.  Cardiovascular: Normal rate and normal heart sounds.   Pulmonary/Chest: Effort normal and breath sounds normal.  Abdominal: Soft. Bowel sounds are normal. He exhibits no distension. There is no tenderness.  Musculoskeletal: Normal range of motion. He exhibits no edema.  Neurological: He is alert and oriented to person, place, and time.  Skin: Skin is warm and dry. Rash noted. There is erythema.  Generalized erythemas rash on back, chest, and bilateral arms.   Psychiatric: He has a normal mood and affect. His behavior is normal. Judgment and thought content normal.   BP 127/70 mmHg  Pulse 60  Temp(Src) 96.6 F (35.9 C) (Oral)  Ht 5\' 8"  (1.727 m)  Wt 135 lb (61.236 kg)  BMI 20.53 kg/m2        Assessment & Plan:  1. Contact dermatitis -Do not scratch -Cool compresses -Referral to dermatology since this is recurrent -RTO prn  - Ambulatory referral to Dermatology - Clobetasol Prop Emollient  Base (CLOBETASOL PROPIONATE E) 0.05 % emollient cream; Apply twice a day to rash  Dispense: 60 g; Refill: [place], FNP

## 2015-11-05 NOTE — Addendum Note (Signed)
Addended by: Evelina Dun A on: 11/05/2015 08:50 AM   Modules accepted: Orders

## 2015-11-11 ENCOUNTER — Encounter: Payer: Self-pay | Admitting: Cardiology

## 2015-11-11 ENCOUNTER — Ambulatory Visit (INDEPENDENT_AMBULATORY_CARE_PROVIDER_SITE_OTHER): Payer: Medicare Other | Admitting: Cardiology

## 2015-11-11 VITALS — BP 133/75 | HR 56 | Ht 68.0 in | Wt 135.0 lb

## 2015-11-11 DIAGNOSIS — I420 Dilated cardiomyopathy: Secondary | ICD-10-CM

## 2015-11-11 NOTE — Patient Instructions (Signed)
Medication Instructions:  The current medical regimen is effective;  continue present plan and medications.  Follow-Up: Follow up in 2 years with Dr. Hochrein.  You will receive a letter in the mail 2 months before you are due.  Please call us when you receive this letter to schedule your follow up appointment.  If you need a refill on your cardiac medications before your next appointment, please call your pharmacy.  Thank you for choosing [facility]!!     

## 2015-11-11 NOTE — Progress Notes (Signed)
HPI The patient has not seen me in a couple of years.  He has seen Dr. Rayann Heman for device follow up.  Since I last saw him he has had no cardiovascular complaints.  The patient denies any new symptoms such as chest discomfort, neck or arm discomfort. There has been no new shortness of breath, PND or orthopnea. There have been no reported palpitations, presyncope or syncope.  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 He he continues to smoke cigarettes.    Allergies  Allergen Reactions  . Sudafed [Pseudoephedrine Hcl] Other (See Comments)    Unknown  . Avelox [Moxifloxacin Hcl In Nacl] Other (See Comments)    Unknown     Current Outpatient Prescriptions  Medication Sig Dispense Refill  . ALPRAZolam (XANAX) 1 MG tablet Take 1 tablet (1 mg total) by mouth 2 (two) times daily as needed. for anxiety 60 tablet 3  . amLODipine (NORVASC) 10 MG tablet Take 1 tablet (10 mg total) by mouth daily. 90 tablet 3  . aspirin 81 MG tablet Take 81 mg by mouth daily.    . cetirizine (ZYRTEC) 10 MG tablet Take 10 mg by mouth daily as needed for allergies.    . Clobetasol Prop Emollient Base (CLOBETASOL PROPIONATE E) 0.05 % emollient cream Apply twice a day to rash 60 g 1  . doxycycline (VIBRA-TABS) 100 MG tablet Take 1 tablet (100 mg total) by mouth 2 (two) times daily. 20 tablet 0  . lisinopril (PRINIVIL,ZESTRIL) 40 MG tablet Take 1 tablet (40 mg total) by mouth daily. 90 tablet 3  . metoprolol (LOPRESSOR) 100 MG tablet TAKE ONE TABLET BY MOUTH TWICE DAILY FOR HIGH BLOOD PRESSURE CONTROL 180 tablet 1  . omeprazole (PRILOSEC) 20 MG capsule Take 1 capsule (20 mg total) by mouth 2 (two) times daily as needed (for acid reflux). 180 capsule 2  . pravastatin (PRAVACHOL) 40 MG tablet TAKE ONE TABLET BY MOUTH ONCE DAILY WITH BREAKFAST 90 tablet 0   No current facility-administered medications for this visit.    Past Medical History  Diagnosis Date  . Nonischemic cardiomyopathy  ([place])     EF was 20% (60% last echo 2012)  . Tobacco abuse   . Carotid artery disease ([place])   . EtOH dependence ([place])   . Peptic ulcer disease   . Dyslipidemia   . CAD (coronary artery disease)     Nonobstructive cath 2003  . Hypertension   . Hyperlipidemia   . Insomnia   . Depression   . Pacemaker     and defibrillator  . Arthritis   . COPD (chronic obstructive pulmonary disease) [facility])     Past Surgical History  Procedure Laterality Date  . Ulcer surgery    . Biventricular defibrillator implantation      [employer]  . Coronary stent placement    . Biv icd genertaor change out N/A 03/06/2014    [employer] Gen change by Dr Rayann Heman Beckie Salts CRT-D with LV1 header)    ROS: As stated in the HPI and negative for all other systems.  PHYSICAL EXAM BP 133/75 mmHg  Pulse 56  Ht 5\' 8"  (1.727 m)  Wt 135 lb (61.236 kg)  BMI 20.53 kg/m2 GENERAL:  Well appearing NECK:  No jugular venous distention, waveform within normal limits, carotid upstroke brisk and symmetric, no bruits, no thyromegaly LUNGS:  Clear to auscultation bilaterally BACK:  No CVA tenderness,  CHEST:  ICD pocket HEART:  PMI not displaced or sustained,S1 and S2 within normal limits, no S3, no S4, no clicks, no rubs, no murmurs ABD:  Flat, positive bowel sounds normal in frequency in pitch, no bruits, no rebound, no guarding, no midline pulsatile mass, no hepatomegaly, no splenomegaly EXT:  2 plus pulses throughout, no edema, no cyanosis no clubbing SKIN:  No rashes no nodules    ASSESSMENT AND PLAN  VENTRICULAR TACHYCARDIA:  He is up to date with ICD follow up.  CARDIOMOPATHY:  His EF was improved still at the last echo in 2012.   He has no new symptoms.  He will continue the medicines as listed.  TOBACCO ABUSE:   He still cannot quit smoking we have talked about this repeatedly including today.   DYSLIPIDEMIA:  This is followed closely by his primary provider.

## 2015-12-06 ENCOUNTER — Other Ambulatory Visit: Payer: Self-pay | Admitting: Family

## 2015-12-07 DIAGNOSIS — H25811 Combined forms of age-related cataract, right eye: Secondary | ICD-10-CM | POA: Diagnosis not present

## 2015-12-07 DIAGNOSIS — H25813 Combined forms of age-related cataract, bilateral: Secondary | ICD-10-CM | POA: Diagnosis not present

## 2015-12-07 DIAGNOSIS — H18452 Nodular corneal degeneration, left eye: Secondary | ICD-10-CM | POA: Diagnosis not present

## 2015-12-07 DIAGNOSIS — H35341 Macular cyst, hole, or pseudohole, right eye: Secondary | ICD-10-CM | POA: Diagnosis not present

## 2015-12-07 DIAGNOSIS — Z9889 Other specified postprocedural states: Secondary | ICD-10-CM | POA: Diagnosis not present

## 2015-12-08 ENCOUNTER — Encounter (HOSPITAL_COMMUNITY)
Admission: RE | Admit: 2015-12-08 | Discharge: 2015-12-08 | Disposition: A | Discharge status: Home / Self Care | Payer: Medicare Other | Source: Ambulatory Visit | Attending: Ophthalmology | Admitting: Ophthalmology

## 2015-12-08 ENCOUNTER — Encounter (HOSPITAL_COMMUNITY): Payer: Self-pay

## 2015-12-08 DIAGNOSIS — Z8711 Personal history of peptic ulcer disease: Secondary | ICD-10-CM | POA: Diagnosis not present

## 2015-12-08 DIAGNOSIS — J449 Chronic obstructive pulmonary disease, unspecified: Secondary | ICD-10-CM | POA: Diagnosis not present

## 2015-12-08 DIAGNOSIS — F17210 Nicotine dependence, cigarettes, uncomplicated: Secondary | ICD-10-CM | POA: Diagnosis not present

## 2015-12-08 DIAGNOSIS — I509 Heart failure, unspecified: Secondary | ICD-10-CM | POA: Diagnosis not present

## 2015-12-08 DIAGNOSIS — Z955 Presence of coronary angioplasty implant and graft: Secondary | ICD-10-CM | POA: Diagnosis not present

## 2015-12-08 DIAGNOSIS — H25811 Combined forms of age-related cataract, right eye: Secondary | ICD-10-CM | POA: Diagnosis not present

## 2015-12-08 DIAGNOSIS — K219 Gastro-esophageal reflux disease without esophagitis: Secondary | ICD-10-CM | POA: Diagnosis not present

## 2015-12-08 DIAGNOSIS — I2510 Atherosclerotic heart disease of native coronary artery without angina pectoris: Secondary | ICD-10-CM | POA: Diagnosis not present

## 2015-12-08 DIAGNOSIS — I110 Hypertensive heart disease with heart failure: Secondary | ICD-10-CM | POA: Diagnosis not present

## 2015-12-08 DIAGNOSIS — I739 Peripheral vascular disease, unspecified: Secondary | ICD-10-CM | POA: Diagnosis not present

## 2015-12-08 DIAGNOSIS — Z950 Presence of cardiac pacemaker: Secondary | ICD-10-CM | POA: Diagnosis not present

## 2015-12-08 DIAGNOSIS — F329 Major depressive disorder, single episode, unspecified: Secondary | ICD-10-CM | POA: Diagnosis not present

## 2015-12-08 LAB — CBC
HCT: 34.4 % — ABNORMAL LOW (ref 39.0–52.0)
Hemoglobin: 12.2 g/dL — ABNORMAL LOW (ref 13.0–17.0)
MCH: 33.3 pg (ref 26.0–34.0)
MCHC: 35.5 g/dL (ref 30.0–36.0)
MCV: 94 fL (ref 78.0–100.0)
PLATELETS: 362 10*3/uL (ref 150–400)
RBC: 3.66 MIL/uL — AB (ref 4.22–5.81)
RDW: 12.9 % (ref 11.5–15.5)
WBC: 8.3 10*3/uL (ref 4.0–10.5)

## 2015-12-08 LAB — BASIC METABOLIC PANEL
Anion gap: 8 (ref 5–15)
BUN: 16 mg/dL (ref 6–20)
CO2: 26 mmol/L (ref 22–32)
Calcium: 9.2 mg/dL (ref 8.9–10.3)
Chloride: 94 mmol/L — ABNORMAL LOW (ref 101–111)
Creatinine, Ser: 1.05 mg/dL (ref 0.61–1.24)
GFR calc Af Amer: >60 mL/min (ref >60)
GLUCOSE: 96 mg/dL (ref 65–99)
POTASSIUM: 4.4 mmol/L (ref 3.5–5.1)
Sodium: 128 mmol/L — ABNORMAL LOW (ref 135–145)

## 2015-12-08 NOTE — Patient Instructions (Signed)
Your procedure is scheduled on:  12/10/2015               Report to [facility] at  6:30   AM.  Call this number if you have problems the morning of surgery: [phone]   Remember:   Do not eat or drink :After Midnight.    Take these medicines the morning of surgery with A SIP OF WATER:         Amlodipine, lisinopril, metoprolol and prilosec   Do not wear jewelry, make-up or nail polish.  Do not wear lotions, powders, or perfumes. You may wear deodorant.  Do not bring valuables to the hospital.  Contacts, dentures or bridgework may not be worn into surgery.  Patients discharged the day of surgery will not be allowed to drive home.  Name and phone number of your driver:    @[email or web address] @ Cataract Surgery  A cataract is a clouding of the lens of the eye. When a lens becomes cloudy, vision is reduced based on the degree and nature of the clouding. Surgery may be needed to improve vision. Surgery removes the cloudy lens and usually replaces it with a substitute lens (intraocular lens, IOL). LET YOUR EYE DOCTOR KNOW ABOUT:  Allergies to food or medicine.   Medicines taken including herbs, eyedrops, over-the-counter medicines, and creams.   Use of steroids (by mouth or creams).   Previous problems with anesthetics or numbing medicine.   History of bleeding problems or blood clots.   Previous surgery.   Other health problems, including diabetes and kidney problems.   Possibility of pregnancy, if this applies.  RISKS AND COMPLICATIONS  Infection.   Inflammation of the eyeball (endophthalmitis) that can spread to both eyes (sympathetic ophthalmia).   Poor wound healing.   If an IOL is inserted, it can later fall out of proper position. This is very uncommon.   Clouding of the part of your eye that holds an IOL in place. This is called an "after-cataract." These are uncommon, but easily treated.  BEFORE THE PROCEDURE  Do not eat or drink anything except small amounts of  water for 8 to 12 before your surgery, or as directed by your caregiver.   Unless you are told otherwise, continue any eyedrops you have been prescribed.   Talk to your primary caregiver about all other medicines that you take (both prescription and non-prescription). In some cases, you may need to stop or change medicines near the time of your surgery. This is most important if you are taking blood-thinning medicine.Do not stop medicines unless you are told to do so.   Arrange for someone to drive you to and from the procedure.   Do not put contact lenses in either eye on the day of your surgery.  PROCEDURE There is more than one method for safely removing a cataract. Your doctor can explain the differences and help determine which is best for you. Phacoemulsification surgery is the most common form of cataract surgery.  An injection is given behind the eye or eyedrops are given to make this a painless procedure.   A small cut (incision) is made on the edge of the clear, dome-shaped surface that covers the front of the eye (cornea).   A tiny probe is painlessly inserted into the eye. This device gives off ultrasound waves that soften and break up the cloudy center of the lens. This makes it easier for the cloudy lens to be removed by suction.   An  IOL may be implanted.   The normal lens of the eye is covered by a clear capsule. Part of that capsule is intentionally left in the eye to support the IOL.   Your surgeon may or may not use stitches to close the incision.  There are other forms of cataract surgery that require a larger incision and stiches to close the eye. This approach is taken in cases where the doctor feels that the cataract cannot be easily removed using phacoemulsification. AFTER THE PROCEDURE  When an IOL is implanted, it does not need care. It becomes a permanent part of your eye and cannot be seen or felt.   Your doctor will schedule follow-up exams to check on your  progress.   Review your other medicines with your doctor to see which can be resumed after surgery.   Use eyedrops or take medicine as prescribed by your doctor.  Document Released: 05/26/2011 Document Reviewed: 05/23/2011 [facility] Patient Information 2012 [place].  .Cataract Surgery Care After Refer to this sheet in the next few weeks. These instructions provide you with information on caring for yourself after your procedure. Your caregiver may also give you more specific instructions. Your treatment has been planned according to current medical practices, but problems sometimes occur. Call your caregiver if you have any problems or questions after your procedure.  HOME CARE INSTRUCTIONS   Avoid strenuous activities as directed by your caregiver.   Ask your caregiver when you can resume driving.   Use eyedrops or other medicines to help healing and control pressure inside your eye as directed by your caregiver.   Only take over-the-counter or prescription medicines for pain, discomfort, or fever as directed by your caregiver.   Do not to touch or rub your eyes.   You may be instructed to use a protective shield during the first few days and nights after surgery. If not, wear sunglasses to protect your eyes. This is to protect the eye from pressure or from being accidentally bumped.   Keep the area around your eye clean and dry. Avoid swimming or allowing water to hit you directly in the face while showering. Keep soap and shampoo out of your eyes.   Do not bend or lift heavy objects. Bending increases pressure in the eye. You can walk, climb stairs, and do light household chores.   Do not put a contact lens into the eye that had surgery until your caregiver says it is okay to do so.   Ask your doctor when you can return to work. This will depend on the kind of work that you do. If you work in a dusty environment, you may be advised to wear protective eyewear for a period of  time.   Ask your caregiver when it will be safe to engage in sexual activity.   Continue with your regular eye exams as directed by your caregiver.  What to expect:  It is normal to feel itching and mild discomfort for a few days after cataract surgery. Some fluid discharge is also common, and your eye may be sensitive to light and touch.   After 1 to 2 days, even moderate discomfort should disappear. In most cases, healing will take about 6 weeks.   If you received an intraocular lens (IOL), you may notice that colors are very bright or have a blue tinge. Also, if you have been in bright sunlight, everything may appear reddish for a few hours. If you see these color tinges,  it is because your lens is clear and no longer cloudy. Within a few months after receiving an IOL, these extra colors should go away. When you have healed, you will probably need new glasses.  SEEK MEDICAL CARE IF:   You have increased bruising around your eye.   You have discomfort not helped by medicine.  SEEK IMMEDIATE MEDICAL CARE IF:   You have a fever.   You have a worsening or sudden vision loss.   You have redness, swelling, or increasing pain in the eye.   You have a thick discharge from the eye that had surgery.  MAKE SURE YOU:  Understand these instructions.   Will watch your condition.   Will get help right away if you are not doing well or get worse.  Document Released: 12/24/2004 Document Revised: 05/26/2011 Document Reviewed: 01/28/2011 [facility] Patient Information 2012 [place].    Monitored Anesthesia Care  Monitored anesthesia care is an anesthesia service for a medical procedure. Anesthesia is the loss of the ability to feel pain. It is produced by medications called anesthetics. It may affect a small area of your body (local anesthesia), a large area of your body (regional anesthesia), or your entire body (general anesthesia). The need for monitored anesthesia care depends your  procedure, your condition, and the potential need for regional or general anesthesia. It is often provided during procedures where:   General anesthesia may be needed if there are complications. This is because you need special care when you are under general anesthesia.   You will be under local or regional anesthesia. This is so that you are able to have higher levels of anesthesia if needed.   You will receive calming medications (sedatives). This is especially the case if sedatives are given to put you in a semi-conscious state of relaxation (deep sedation). This is because the amount of sedative needed to produce this state can be hard to predict. Too much of a sedative can produce general anesthesia. Monitored anesthesia care is performed by one or more caregivers who have special training in all types of anesthesia. You will need to meet with these caregivers before your procedure. During this meeting, they will ask you about your medical history. They will also give you instructions to follow. (For example, you will need to stop eating and drinking before your procedure. You may also need to stop or change medications you are taking.) During your procedure, your caregivers will stay with you. They will:   Watch your condition. This includes watching you blood pressure, breathing, and level of pain.   Diagnose and treat problems that occur.   Give medications if they are needed. These may include calming medications (sedatives) and anesthetics.   Make sure you are comfortable.  Having monitored anesthesia care does not necessarily mean that you will be under anesthesia. It does mean that your caregivers will be able to manage anesthesia if you need it or if it occurs. It also means that you will be able to have a different type of anesthesia than you are having if you need it. When your procedure is complete, your caregivers will continue to watch your condition. They will make sure any  medications wear off before you are allowed to go home.  Document Released: 03/02/2005 Document Revised: 10/01/2012 Document Reviewed: 07/18/2012 [facility] Patient Information 2014 [place], [place].

## 2015-12-08 NOTE — Progress Notes (Signed)
   12/08/15 1402  OBSTRUCTIVE SLEEP APNEA  Have you ever been diagnosed with sleep apnea through a sleep study? No  Do you snore loudly (loud enough to be heard through closed doors)?  1  Has anyone observed you stop breathing during your sleep? 1  Do you have, or are you being treated for high blood pressure? 1  BMI more than 35 kg/m2? 0  Age > 50 (1-yes) 1  Neck circumference greater than:Male 16 inches or larger, Male 17inches or larger? 0  Male Gender (Yes=1) 1  Obstructive Sleep Apnea Score 5  Score 5 or greater  Results sent to PCP

## 2015-12-09 MED ORDER — LIDOCAINE HCL 3.5 % OP GEL
OPHTHALMIC | Status: AC
  Filled 2015-12-09: qty 1

## 2015-12-09 MED ORDER — LIDOCAINE HCL (PF) 1 % IJ SOLN
INTRAMUSCULAR | Status: AC
  Filled 2015-12-09: qty 2

## 2015-12-09 MED ORDER — PHENYLEPHRINE HCL 2.5 % OP SOLN
OPHTHALMIC | Status: AC
  Filled 2015-12-09: qty 15

## 2015-12-09 MED ORDER — NEOMYCIN-POLYMYXIN-DEXAMETH 3.5-10000-0.1 OP SUSP
OPHTHALMIC | Status: AC
  Filled 2015-12-09: qty 5

## 2015-12-09 MED ORDER — CYCLOPENTOLATE-PHENYLEPHRINE OP SOLN OPTIME - NO CHARGE
OPHTHALMIC | Status: AC
  Filled 2015-12-09: qty 2

## 2015-12-09 MED ORDER — TETRACAINE HCL 0.5 % OP SOLN
OPHTHALMIC | Status: AC
  Filled 2015-12-09: qty 4

## 2015-12-10 ENCOUNTER — Ambulatory Visit (HOSPITAL_COMMUNITY): Payer: Medicare Other | Admitting: Anesthesiology

## 2015-12-10 ENCOUNTER — Encounter (HOSPITAL_COMMUNITY): Payer: Self-pay | Admitting: *Deleted

## 2015-12-10 ENCOUNTER — Ambulatory Visit (HOSPITAL_COMMUNITY)
Admission: RE | Admit: 2015-12-10 | Discharge: 2015-12-10 | Disposition: A | Discharge status: Home / Self Care | Payer: Medicare Other | Source: Ambulatory Visit | Attending: Ophthalmology | Admitting: Ophthalmology

## 2015-12-10 ENCOUNTER — Encounter (HOSPITAL_COMMUNITY)
Admission: RE | Disposition: A | Discharge status: Home / Self Care | Payer: Self-pay | Source: Ambulatory Visit | Attending: Ophthalmology

## 2015-12-10 DIAGNOSIS — H25811 Combined forms of age-related cataract, right eye: Secondary | ICD-10-CM | POA: Insufficient documentation

## 2015-12-10 DIAGNOSIS — I509 Heart failure, unspecified: Secondary | ICD-10-CM | POA: Diagnosis not present

## 2015-12-10 DIAGNOSIS — Z8711 Personal history of peptic ulcer disease: Secondary | ICD-10-CM | POA: Insufficient documentation

## 2015-12-10 DIAGNOSIS — Z955 Presence of coronary angioplasty implant and graft: Secondary | ICD-10-CM | POA: Diagnosis not present

## 2015-12-10 DIAGNOSIS — F329 Major depressive disorder, single episode, unspecified: Secondary | ICD-10-CM | POA: Diagnosis not present

## 2015-12-10 DIAGNOSIS — I2510 Atherosclerotic heart disease of native coronary artery without angina pectoris: Secondary | ICD-10-CM | POA: Diagnosis not present

## 2015-12-10 DIAGNOSIS — H2511 Age-related nuclear cataract, right eye: Secondary | ICD-10-CM | POA: Diagnosis not present

## 2015-12-10 DIAGNOSIS — K219 Gastro-esophageal reflux disease without esophagitis: Secondary | ICD-10-CM | POA: Insufficient documentation

## 2015-12-10 DIAGNOSIS — J449 Chronic obstructive pulmonary disease, unspecified: Secondary | ICD-10-CM | POA: Insufficient documentation

## 2015-12-10 DIAGNOSIS — I110 Hypertensive heart disease with heart failure: Secondary | ICD-10-CM | POA: Diagnosis not present

## 2015-12-10 DIAGNOSIS — H269 Unspecified cataract: Secondary | ICD-10-CM | POA: Diagnosis not present

## 2015-12-10 DIAGNOSIS — I739 Peripheral vascular disease, unspecified: Secondary | ICD-10-CM | POA: Insufficient documentation

## 2015-12-10 DIAGNOSIS — Z950 Presence of cardiac pacemaker: Secondary | ICD-10-CM | POA: Diagnosis not present

## 2015-12-10 DIAGNOSIS — F17210 Nicotine dependence, cigarettes, uncomplicated: Secondary | ICD-10-CM | POA: Diagnosis not present

## 2015-12-10 HISTORY — PX: CATARACT EXTRACTION W/PHACO: SHX586

## 2015-12-10 SURGERY — PHACOEMULSIFICATION, CATARACT, WITH IOL INSERTION
Anesthesia: Monitor Anesthesia Care | Site: Eye | Laterality: Right

## 2015-12-10 MED ORDER — MIDAZOLAM HCL 2 MG/2ML IJ SOLN
1.0000 mg | INTRAMUSCULAR | Status: DC | PRN
Start: 2015-12-10 — End: 2015-12-10
  Administered 2015-12-10 (×2): 2 mg via INTRAVENOUS

## 2015-12-10 MED ORDER — FENTANYL CITRATE (PF) 100 MCG/2ML IJ SOLN
INTRAMUSCULAR | Status: AC
  Filled 2015-12-10: qty 2

## 2015-12-10 MED ORDER — CYCLOPENTOLATE-PHENYLEPHRINE 0.2-1 % OP SOLN
1.0000 [drp] | OPHTHALMIC | Status: AC
  Administered 2015-12-10 (×3): 1 [drp] via OPHTHALMIC

## 2015-12-10 MED ORDER — EPINEPHRINE HCL 1 MG/ML IJ SOLN
INTRAOCULAR | Status: DC | PRN
  Administered 2015-12-10: 500 mL

## 2015-12-10 MED ORDER — PHENYLEPHRINE HCL 2.5 % OP SOLN
1.0000 [drp] | OPHTHALMIC | Status: AC
  Administered 2015-12-10 (×3): 1 [drp] via OPHTHALMIC

## 2015-12-10 MED ORDER — NEOMYCIN-POLYMYXIN-DEXAMETH 3.5-10000-0.1 OP SUSP
OPHTHALMIC | Status: DC | PRN
  Administered 2015-12-10: 2 [drp] via OPHTHALMIC

## 2015-12-10 MED ORDER — LIDOCAINE HCL (PF) 1 % IJ SOLN
INTRAMUSCULAR | Status: DC | PRN
  Administered 2015-12-10: .5 mL

## 2015-12-10 MED ORDER — POVIDONE-IODINE 5 % OP SOLN
OPHTHALMIC | Status: DC | PRN
  Administered 2015-12-10: 1 via OPHTHALMIC

## 2015-12-10 MED ORDER — EPINEPHRINE HCL 1 MG/ML IJ SOLN
INTRAMUSCULAR | Status: AC
  Filled 2015-12-10: qty 1

## 2015-12-10 MED ORDER — TETRACAINE HCL 0.5 % OP SOLN
1.0000 [drp] | OPHTHALMIC | Status: AC
  Administered 2015-12-10 (×3): 1 [drp] via OPHTHALMIC

## 2015-12-10 MED ORDER — MIDAZOLAM HCL 2 MG/2ML IJ SOLN
INTRAMUSCULAR | Status: AC
  Filled 2015-12-10: qty 2

## 2015-12-10 MED ORDER — PROVISC 10 MG/ML IO SOLN
INTRAOCULAR | Status: DC | PRN
  Administered 2015-12-10: 0.85 mL via INTRAOCULAR

## 2015-12-10 MED ORDER — FENTANYL CITRATE (PF) 100 MCG/2ML IJ SOLN
25.0000 ug | INTRAMUSCULAR | Status: AC | PRN
  Administered 2015-12-10 (×2): 25 ug via INTRAVENOUS

## 2015-12-10 MED ORDER — BSS IO SOLN
INTRAOCULAR | Status: DC | PRN
  Administered 2015-12-10: 15 mL via INTRAOCULAR

## 2015-12-10 MED ORDER — LIDOCAINE 3.5 % OP GEL OPTIME - NO CHARGE
OPHTHALMIC | Status: DC | PRN
  Administered 2015-12-10: 2 [drp] via OPHTHALMIC

## 2015-12-10 MED ORDER — LACTATED RINGERS IV SOLN
INTRAVENOUS | Status: DC
  Administered 2015-12-10: 07:00:00 via INTRAVENOUS

## 2015-12-10 SURGICAL SUPPLY — 25 items
CAPSULAR TENSION RING-AMO (OPHTHALMIC RELATED) IMPLANT
CLOTH BEACON ORANGE TIMEOUT ST (SAFETY) ×2 IMPLANT
EYE SHIELD UNIVERSAL CLEAR (GAUZE/BANDAGES/DRESSINGS) ×2 IMPLANT
GLOVE BIOGEL PI IND STRL 6.5 (GLOVE) IMPLANT
GLOVE BIOGEL PI IND STRL 7.0 (GLOVE) IMPLANT
GLOVE BIOGEL PI IND STRL 7.5 (GLOVE) IMPLANT
GLOVE BIOGEL PI INDICATOR 6.5 (GLOVE) ×4
GLOVE BIOGEL PI INDICATOR 7.0 (GLOVE)
GLOVE BIOGEL PI INDICATOR 7.5 (GLOVE)
GLOVE EXAM NITRILE LRG STRL (GLOVE) IMPLANT
GLOVE EXAM NITRILE MD LF STRL (GLOVE) IMPLANT
KIT VITRECTOMY (OPHTHALMIC RELATED) IMPLANT
PAD ARMBOARD 7.5X6 YLW CONV (MISCELLANEOUS) ×2 IMPLANT
PROC W NO LENS (INTRAOCULAR LENS)
PROC W SPEC LENS (INTRAOCULAR LENS)
PROCESS W NO LENS (INTRAOCULAR LENS) IMPLANT
PROCESS W SPEC LENS (INTRAOCULAR LENS) IMPLANT
RETRACTOR IRIS SIGHTPATH (OPHTHALMIC RELATED) IMPLANT
RING MALYGIN (MISCELLANEOUS) IMPLANT
SIGHTPATH CAT PROC W REG LENS (Ophthalmic Related) ×3 IMPLANT
SYRINGE LUER LOK 1CC (MISCELLANEOUS) ×2 IMPLANT
TAPE SURG TRANSPORE 1 IN (GAUZE/BANDAGES/DRESSINGS) IMPLANT
TAPE SURGICAL TRANSPORE 1 IN (GAUZE/BANDAGES/DRESSINGS) ×2
VISCOELASTIC ADDITIONAL (OPHTHALMIC RELATED) IMPLANT
WATER STERILE IRR 250ML POUR (IV SOLUTION) ×2 IMPLANT

## 2015-12-10 NOTE — Anesthesia Postprocedure Evaluation (Signed)
Anesthesia Post Note  Patient: [name]  Procedure(s) Performed: Procedure(s) (LRB): CATARACT EXTRACTION PHACO AND INTRAOCULAR LENS PLACEMENT RIGHT EYE; CDE: 19.30 (Right)  Patient location during evaluation: Short Stay Anesthesia Type: MAC Level of consciousness: awake and alert, oriented and patient cooperative Pain management: pain level controlled Vital Signs Assessment: post-procedure vital signs reviewed and stable Respiratory status: spontaneous breathing Cardiovascular status: blood pressure returned to baseline and stable Postop Assessment: no signs of nausea or vomiting Anesthetic complications: no    Last Vitals:  Filed Vitals:   12/10/15 0810 12/10/15 0815  BP: 144/83 146/83  Resp: 15 21    Last Pain: There were no vitals filed for this visit.               Kyrollos Cordell J

## 2015-12-10 NOTE — Anesthesia Preprocedure Evaluation (Signed)
Anesthesia Evaluation  Patient identified by MRN, date of birth, ID band Patient awake    Reviewed: Allergy & Precautions, NPO status , Patient's Chart, lab work & pertinent test results, reviewed documented beta blocker date and time   Airway Mallampati: II  TM Distance: >3 FB     Dental  (+) Edentulous Upper   Pulmonary COPD, Current Smoker,    breath sounds clear to auscultation       Cardiovascular hypertension, Pt. on home beta blockers and Pt. on medications + CAD, + Cardiac Stents, + Peripheral Vascular Disease and +CHF  + pacemaker  Rhythm:Regular Rate:Normal     Neuro/Psych PSYCHIATRIC DISORDERS Depression    GI/Hepatic PUD, GERD  ,(+)     substance abuse  alcohol use,   Endo/Other    Renal/GU      Musculoskeletal   Abdominal   Peds  Hematology   Anesthesia Other Findings   Reproductive/Obstetrics                             Anesthesia Physical Anesthesia Plan  ASA: IV  Anesthesia Plan: MAC   Post-op Pain Management:    Induction: Intravenous  Airway Management Planned: Nasal Cannula  Additional Equipment:   Intra-op Plan:   Post-operative Plan:   Informed Consent: I have reviewed the patients History and Physical, chart, labs and discussed the procedure including the risks, benefits and alternatives for the proposed anesthesia with the patient or authorized representative who has indicated his/her understanding and acceptance.     Plan Discussed with:   Anesthesia Plan Comments:         Anesthesia Quick Evaluation

## 2015-12-10 NOTE — Op Note (Signed)
Date of Admission: 12/10/2015  Date of Surgery: 12/10/2015   Pre-Op Dx: Cataract Right Eye  Post-Op Dx: Senile Combined Cataract Right  Eye,  Dx Code [id number]  Surgeon: Tonny Branch, M.D.  Assistants: None  Anesthesia: Topical with MAC  Indications: Painless, progressive loss of vision with compromise of daily activities.  Surgery: Cataract Extraction with Intraocular lens Implant Right Eye  Discription: The patient had dilating drops and viscous lidocaine placed into the Right eye in the pre-op holding area. After transfer to the operating room, a time out was performed. The patient was then prepped and draped. Beginning with a 71 degree blade a paracentesis port was made at the surgeon's 2 o'clock position. The anterior chamber was then filled with 1% non-preserved lidocaine. This was followed by filling the anterior chamber with Provisc.  A 2.49mm keratome blade was used to make a clear corneal incision at the temporal limbus.  A bent cystatome needle was used to create a continuous tear capsulotomy. Hydrodissection was performed with balanced salt solution on a Fine canula. The lens nucleus was then removed using the phacoemulsification handpiece. Residual cortex was removed with the I&A handpiece. During cortical removal zonular weakness was noted superiorly for about 4 clock hours. The anterior chamber and capsular bag were refilled with Provisc. A posterior chamber intraocular lens was placed into the capsular bag with it's injector. The implant was positioned with the Kuglan hook.  It was oriented to support the zonule weakness.  The Provisc was then removed from the anterior chamber and capsular bag with the I&A handpiece. Stromal hydration of the main incision and paracentesis port was performed with BSS on a Fine canula. The wounds were tested for leak which was negative. The patient tolerated the procedure well. There were no operative complications. The patient was then transferred to the  recovery room in stable condition.  Complications: None  Specimen: None  EBL: None  Prosthetic device: Hoya iSert 250, power 21.0 D, SN [id number].

## 2015-12-10 NOTE — Transfer of Care (Signed)
Immediate Anesthesia Transfer of Care Note  Patient: [name]  Procedure(s) Performed: Procedure(s): CATARACT EXTRACTION PHACO AND INTRAOCULAR LENS PLACEMENT RIGHT EYE; CDE: 19.30 (Right)  Patient Location: PACU  Anesthesia Type:MAC  Level of Consciousness: awake, alert , oriented and patient cooperative  Airway & Oxygen Therapy: Patient Spontanous Breathing  Post-op Assessment: Report given to RN, Post -op Vital signs reviewed and stable and Patient moving all extremities  Post vital signs: Reviewed and stable  Last Vitals:  Filed Vitals:   12/10/15 0810 12/10/15 0815  BP: 144/83 146/83  Resp: 15 21    Last Pain: There were no vitals filed for this visit.    Patients Stated Pain Goal: 8 ([id number])  Complications: No apparent anesthesia complications

## 2015-12-10 NOTE — H&P (Signed)
I have reviewed the H&P, the patient was re-examined, and I have identified no interval changes in medical condition and plan of care since the history and physical of record  

## 2015-12-11 ENCOUNTER — Encounter (HOSPITAL_COMMUNITY): Payer: Self-pay | Admitting: Ophthalmology

## 2015-12-14 ENCOUNTER — Other Ambulatory Visit: Payer: Self-pay | Admitting: Family

## 2015-12-15 ENCOUNTER — Telehealth: Payer: Self-pay | Admitting: Family

## 2015-12-15 NOTE — Telephone Encounter (Signed)
Last filled 06/18/15 with 3 RF. Route to pool to call in at [facility]

## 2015-12-15 NOTE — Telephone Encounter (Signed)
rx called into pharmacy

## 2015-12-16 ENCOUNTER — Telehealth: Payer: Self-pay | Admitting: Family

## 2015-12-16 NOTE — Telephone Encounter (Signed)
Spoke with pt and advised we were returning his call about his xanax and pt states he got it yesterday.

## 2015-12-16 NOTE — Telephone Encounter (Signed)
Done yesterday. Pt does not have voicemail

## 2016-01-04 DIAGNOSIS — Z961 Presence of intraocular lens: Secondary | ICD-10-CM | POA: Diagnosis not present

## 2016-01-04 DIAGNOSIS — H25812 Combined forms of age-related cataract, left eye: Secondary | ICD-10-CM | POA: Diagnosis not present

## 2016-01-08 ENCOUNTER — Encounter: Payer: Self-pay | Admitting: Nurse Practitioner

## 2016-01-08 ENCOUNTER — Ambulatory Visit (INDEPENDENT_AMBULATORY_CARE_PROVIDER_SITE_OTHER): Payer: Medicare Other | Admitting: Nurse Practitioner

## 2016-01-08 VITALS — BP 130/73 | HR 68 | Temp 97.1°F | Ht 68.0 in | Wt 136.0 lb

## 2016-01-08 DIAGNOSIS — B351 Tinea unguium: Secondary | ICD-10-CM

## 2016-01-08 MED ORDER — EFINACONAZOLE 10 % EX SOLN: CUTANEOUS | Status: DC

## 2016-01-08 NOTE — Patient Instructions (Signed)
Nail Ringworm A fungal infection of the nail (tinea unguium/onychomycosis) is common. It is common as the visible part of the nail is composed of dead cells which have no blood supply to help prevent infection. It occurs because fungi are everywhere and will pick any opportunity to grow on any dead material. Because nails are very slow growing they require up to 2 years of treatment with anti-fungal medications. The entire nail back to the base is infected. This includes approximately  of the nail which you cannot see. If your caregiver has prescribed a medication by mouth, take it every day and as directed. No progress will be seen for at least 6 to 9 months. Do not be disappointed! Because fungi live on dead cells with little or no exposure to blood supply, medication delivery to the infection is slow; thus the cure is slow. It is also why you can observe no progress in the first 6 months. The nail becoming cured is the base of the nail, as it has the blood supply. Topical medication such as creams and ointments are usually not effective. Important in successful treatment of nail fungus is closely following the medication regimen that your doctor prescribes. Sometimes you and your caregiver may elect to speed up this process by surgical removal of all the nails. Even this may still require 6 to 9 months of additional oral medications. See your caregiver as directed. Remember there will be no visible improvement for at least 6 months. See your caregiver sooner if other signs of infection (redness and swelling) develop.   This information is not intended to replace advice given to you by your health care provider. Make sure you discuss any questions you have with your health care provider.   Document Released: 06/03/2000 Document Revised: 10/21/2014 Document Reviewed: 12/08/2014 Elsevier Interactive Patient Education 2016 [employer]  

## 2016-01-08 NOTE — Progress Notes (Signed)
   Subjective:    Patient ID: [name], male    DOB: 03-12-1941, 75 y.o.   MRN: [id number]  HPI Patient comes in today c/o nails on fingers coming off and toenails hurt. His toes hurt at nail bed as well as tips of toes. Hurts to walk. This started about 7 months ago. He soaked his feet in epsom salt and that made a world of difference.He takes a multi vitamin everyday.     Review of Systems  Constitutional: Negative.   Respiratory: Negative.   Cardiovascular: Negative.   Genitourinary: Negative.   Neurological: Negative.   Psychiatric/Behavioral: Negative.   All other systems reviewed and are negative.      Objective:   Physical Exam  Constitutional: He is oriented to person, place, and time. He appears well-developed and well-nourished.  Cardiovascular: Normal rate.   Pulmonary/Chest: Effort normal and breath sounds normal.  Neurological: He is alert and oriented to person, place, and time.  Skin: Skin is warm.  Finger nails are pulling away from nail beds- thick with longitudinal grooves. Yellowish brown discoloration. Toe nails are thick- pain with pressing on right toenail  Psychiatric: He has a normal mood and affect. His behavior is normal. Judgment and thought content normal.    BP 130/73 mmHg  Pulse 68  Temp(Src) 97.1 F (36.2 C) (Oral)  Ht 5\' 8"  (1.727 m)  Wt 136 lb (61.689 kg)  BMI 20.68 kg/m2       Assessment & Plan:   1. Onychomycosis    Meds ordered this encounter  Medications  . Efinaconazole 10 % SOLN    Sig: Apply daily to nails    Dispense:  8 mL    Refill:  3    Order Specific Question:  Supervising Provider    Answer:  Evette Doffing, CAROL L [[id number]]   Do not pick at nails Continue to soak feet in epsom salt if helps RTO prn  Mary-Margaret Hassell Done, FNP

## 2016-01-09 ENCOUNTER — Other Ambulatory Visit: Payer: Self-pay | Admitting: Family

## 2016-01-11 ENCOUNTER — Encounter (HOSPITAL_COMMUNITY)
Admission: RE | Admit: 2016-01-11 | Discharge: 2016-01-11 | Disposition: A | Discharge status: Home / Self Care | Payer: Medicare Other | Source: Ambulatory Visit | Attending: Ophthalmology | Admitting: Ophthalmology

## 2016-01-11 ENCOUNTER — Encounter (HOSPITAL_COMMUNITY): Payer: Self-pay

## 2016-01-12 ENCOUNTER — Other Ambulatory Visit: Payer: Self-pay | Admitting: Family

## 2016-01-13 NOTE — Telephone Encounter (Signed)
Please forward this to [facility] for her approval

## 2016-01-14 ENCOUNTER — Encounter (HOSPITAL_COMMUNITY): Payer: Self-pay | Admitting: *Deleted

## 2016-01-14 ENCOUNTER — Ambulatory Visit (HOSPITAL_COMMUNITY)
Admission: RE | Admit: 2016-01-14 | Discharge: 2016-01-14 | Disposition: A | Discharge status: Home / Self Care | Payer: Medicare Other | Source: Ambulatory Visit | Attending: Ophthalmology | Admitting: Ophthalmology

## 2016-01-14 ENCOUNTER — Ambulatory Visit (HOSPITAL_COMMUNITY): Payer: Medicare Other | Admitting: Anesthesiology

## 2016-01-14 ENCOUNTER — Encounter (HOSPITAL_COMMUNITY)
Admission: RE | Disposition: A | Discharge status: Home / Self Care | Payer: Self-pay | Source: Ambulatory Visit | Attending: Ophthalmology

## 2016-01-14 DIAGNOSIS — H2512 Age-related nuclear cataract, left eye: Secondary | ICD-10-CM | POA: Diagnosis not present

## 2016-01-14 DIAGNOSIS — I110 Hypertensive heart disease with heart failure: Secondary | ICD-10-CM | POA: Diagnosis not present

## 2016-01-14 DIAGNOSIS — I2510 Atherosclerotic heart disease of native coronary artery without angina pectoris: Secondary | ICD-10-CM | POA: Insufficient documentation

## 2016-01-14 DIAGNOSIS — F329 Major depressive disorder, single episode, unspecified: Secondary | ICD-10-CM | POA: Diagnosis not present

## 2016-01-14 DIAGNOSIS — Z8711 Personal history of peptic ulcer disease: Secondary | ICD-10-CM | POA: Insufficient documentation

## 2016-01-14 DIAGNOSIS — I509 Heart failure, unspecified: Secondary | ICD-10-CM | POA: Insufficient documentation

## 2016-01-14 DIAGNOSIS — Z950 Presence of cardiac pacemaker: Secondary | ICD-10-CM | POA: Diagnosis not present

## 2016-01-14 DIAGNOSIS — I739 Peripheral vascular disease, unspecified: Secondary | ICD-10-CM | POA: Insufficient documentation

## 2016-01-14 DIAGNOSIS — J449 Chronic obstructive pulmonary disease, unspecified: Secondary | ICD-10-CM | POA: Diagnosis not present

## 2016-01-14 DIAGNOSIS — H25812 Combined forms of age-related cataract, left eye: Secondary | ICD-10-CM | POA: Diagnosis not present

## 2016-01-14 DIAGNOSIS — Z955 Presence of coronary angioplasty implant and graft: Secondary | ICD-10-CM | POA: Insufficient documentation

## 2016-01-14 DIAGNOSIS — H269 Unspecified cataract: Secondary | ICD-10-CM | POA: Diagnosis not present

## 2016-01-14 DIAGNOSIS — F17200 Nicotine dependence, unspecified, uncomplicated: Secondary | ICD-10-CM | POA: Insufficient documentation

## 2016-01-14 DIAGNOSIS — K219 Gastro-esophageal reflux disease without esophagitis: Secondary | ICD-10-CM | POA: Diagnosis not present

## 2016-01-14 HISTORY — PX: CATARACT EXTRACTION W/PHACO: SHX586

## 2016-01-14 SURGERY — PHACOEMULSIFICATION, CATARACT, WITH IOL INSERTION
Anesthesia: Monitor Anesthesia Care | Site: Eye | Laterality: Left

## 2016-01-14 MED ORDER — MIDAZOLAM HCL 2 MG/2ML IJ SOLN
1.0000 mg | INTRAMUSCULAR | Status: DC | PRN
  Administered 2016-01-14: 2 mg via INTRAVENOUS

## 2016-01-14 MED ORDER — TETRACAINE HCL 0.5 % OP SOLN
1.0000 [drp] | OPHTHALMIC | Status: AC
  Administered 2016-01-14 (×3): 1 [drp] via OPHTHALMIC

## 2016-01-14 MED ORDER — EPINEPHRINE HCL 1 MG/ML IJ SOLN
INTRAMUSCULAR | Status: AC
Start: 2016-01-14 — End: 2016-01-14
  Filled 2016-01-14: qty 1

## 2016-01-14 MED ORDER — EPINEPHRINE HCL 1 MG/ML IJ SOLN
INTRAMUSCULAR | Status: DC | PRN
  Administered 2016-01-14: 500 mL

## 2016-01-14 MED ORDER — CYCLOPENTOLATE-PHENYLEPHRINE 0.2-1 % OP SOLN
1.0000 [drp] | OPHTHALMIC | Status: AC
Start: 2016-01-14 — End: 2016-01-14
  Administered 2016-01-14 (×3): 1 [drp] via OPHTHALMIC

## 2016-01-14 MED ORDER — LACTATED RINGERS IV SOLN
INTRAVENOUS | Status: DC
  Administered 2016-01-14: 08:00:00 via INTRAVENOUS

## 2016-01-14 MED ORDER — POVIDONE-IODINE 5 % OP SOLN
OPHTHALMIC | Status: DC | PRN
  Administered 2016-01-14: 1 via OPHTHALMIC

## 2016-01-14 MED ORDER — NEOMYCIN-POLYMYXIN-DEXAMETH 3.5-10000-0.1 OP SUSP
OPHTHALMIC | Status: DC | PRN
  Administered 2016-01-14: 2 [drp] via OPHTHALMIC

## 2016-01-14 MED ORDER — LIDOCAINE HCL 3.5 % OP GEL
1.0000 "application " | Freq: Once | OPHTHALMIC | Status: AC
  Administered 2016-01-14: 1 via OPHTHALMIC

## 2016-01-14 MED ORDER — PHENYLEPHRINE HCL 2.5 % OP SOLN
1.0000 [drp] | OPHTHALMIC | Status: AC
  Administered 2016-01-14 (×3): 1 [drp] via OPHTHALMIC

## 2016-01-14 MED ORDER — FENTANYL CITRATE (PF) 100 MCG/2ML IJ SOLN
25.0000 ug | INTRAMUSCULAR | Status: AC | PRN
  Administered 2016-01-14 (×2): 25 ug via INTRAVENOUS

## 2016-01-14 MED ORDER — FENTANYL CITRATE (PF) 100 MCG/2ML IJ SOLN
INTRAMUSCULAR | Status: AC
  Filled 2016-01-14: qty 2

## 2016-01-14 MED ORDER — PROVISC 10 MG/ML IO SOLN
INTRAOCULAR | Status: DC | PRN
  Administered 2016-01-14: 0.85 mL via INTRAOCULAR

## 2016-01-14 MED ORDER — BSS IO SOLN
INTRAOCULAR | Status: DC | PRN
  Administered 2016-01-14: 15 mL via INTRAOCULAR

## 2016-01-14 MED ORDER — LIDOCAINE HCL (PF) 1 % IJ SOLN
INTRAOCULAR | Status: DC | PRN
  Administered 2016-01-14: .7 mL via OPHTHALMIC

## 2016-01-14 MED ORDER — EPINEPHRINE HCL 1 MG/ML IJ SOLN
INTRAMUSCULAR | Status: AC
  Filled 2016-01-14: qty 1

## 2016-01-14 MED ORDER — MIDAZOLAM HCL 2 MG/2ML IJ SOLN
INTRAMUSCULAR | Status: AC
  Filled 2016-01-14: qty 2

## 2016-01-14 SURGICAL SUPPLY — 25 items
CAPSULAR TENSION RING-AMO (OPHTHALMIC RELATED) IMPLANT
CLOTH BEACON ORANGE TIMEOUT ST (SAFETY) ×2 IMPLANT
EYE SHIELD UNIVERSAL CLEAR (GAUZE/BANDAGES/DRESSINGS) ×2 IMPLANT
GLOVE BIOGEL PI IND STRL 6.5 (GLOVE) IMPLANT
GLOVE BIOGEL PI IND STRL 7.0 (GLOVE) IMPLANT
GLOVE BIOGEL PI IND STRL 7.5 (GLOVE) IMPLANT
GLOVE BIOGEL PI INDICATOR 6.5 (GLOVE) ×2
GLOVE BIOGEL PI INDICATOR 7.0 (GLOVE)
GLOVE BIOGEL PI INDICATOR 7.5 (GLOVE)
GLOVE EXAM NITRILE LRG STRL (GLOVE) IMPLANT
GLOVE EXAM NITRILE MD LF STRL (GLOVE) ×2 IMPLANT
KIT VITRECTOMY (OPHTHALMIC RELATED) IMPLANT
PAD ARMBOARD 7.5X6 YLW CONV (MISCELLANEOUS) ×2 IMPLANT
PROC W NO LENS (INTRAOCULAR LENS)
PROC W SPEC LENS (INTRAOCULAR LENS)
PROCESS W NO LENS (INTRAOCULAR LENS) IMPLANT
PROCESS W SPEC LENS (INTRAOCULAR LENS) IMPLANT
RETRACTOR IRIS SIGHTPATH (OPHTHALMIC RELATED) IMPLANT
RING MALYGIN (MISCELLANEOUS) IMPLANT
SIGHTPATH CAT PROC W REG LENS (Ophthalmic Related) ×3 IMPLANT
SYRINGE LUER LOK 1CC (MISCELLANEOUS) ×2 IMPLANT
TAPE SURG TRANSPORE 1 IN (GAUZE/BANDAGES/DRESSINGS) IMPLANT
TAPE SURGICAL TRANSPORE 1 IN (GAUZE/BANDAGES/DRESSINGS) ×2
VISCOELASTIC ADDITIONAL (OPHTHALMIC RELATED) IMPLANT
WATER STERILE IRR 250ML POUR (IV SOLUTION) ×2 IMPLANT

## 2016-01-14 NOTE — Consult Note (Signed)
I have reviewed the H&P, the patient was re-examined, and I have identified no interval changes in medical condition and plan of care since the history and physical of record  

## 2016-01-14 NOTE — Transfer of Care (Signed)
Immediate Anesthesia Transfer of Care Note  Patient: [name]  Procedure(s) Performed: Procedure(s): CATARACT EXTRACTION PHACO AND INTRAOCULAR LENS PLACEMENT LEFT EYE; CDE:  8.03 (Left)  Patient Location: Short Stay  Anesthesia Type:MAC  Level of Consciousness: awake, alert , oriented and patient cooperative  Airway & Oxygen Therapy: Patient Spontanous Breathing  Post-op Assessment: Report given to RN, Post -op Vital signs reviewed and stable and Patient moving all extremities  Post vital signs: Reviewed and stable  Last Vitals:  Vitals:   01/14/16 0750 01/14/16 0753  Pulse:  68  Temp: 36.4 C     Last Pain: There were no vitals filed for this visit.    Patients Stated Pain Goal: 5 ([id number])  Complications: No apparent anesthesia complications

## 2016-01-14 NOTE — Anesthesia Preprocedure Evaluation (Signed)
Anesthesia Evaluation  Patient identified by MRN, date of birth, ID band Patient awake    Reviewed: Allergy & Precautions, NPO status , Patient's Chart, lab work & pertinent test results, reviewed documented beta blocker date and time   Airway Mallampati: II  TM Distance: >3 FB     Dental  (+) Edentulous Upper   Pulmonary COPD, Current Smoker,    breath sounds clear to auscultation       Cardiovascular hypertension, Pt. on home beta blockers and Pt. on medications + CAD, + Cardiac Stents, + Peripheral Vascular Disease and +CHF  + pacemaker  Rhythm:Regular Rate:Normal     Neuro/Psych PSYCHIATRIC DISORDERS Depression    GI/Hepatic PUD, GERD  ,(+)     substance abuse  alcohol use,   Endo/Other    Renal/GU      Musculoskeletal   Abdominal   Peds  Hematology   Anesthesia Other Findings   Reproductive/Obstetrics                             Anesthesia Physical Anesthesia Plan  ASA: IV  Anesthesia Plan: MAC   Post-op Pain Management:    Induction: Intravenous  Airway Management Planned: Nasal Cannula  Additional Equipment:   Intra-op Plan:   Post-operative Plan:   Informed Consent: I have reviewed the patients History and Physical, chart, labs and discussed the procedure including the risks, benefits and alternatives for the proposed anesthesia with the patient or authorized representative who has indicated his/her understanding and acceptance.     Plan Discussed with:   Anesthesia Plan Comments:         Anesthesia Quick Evaluation

## 2016-01-14 NOTE — Op Note (Signed)
Date of Admission: 01/14/2016  Date of Surgery: 01/14/2016   Pre-Op Dx: Cataract Left Eye  Post-Op Dx: Senile Combined Cataract Left  Eye,  Dx Code [id number]  Surgeon: Tonny Branch, M.D.  Assistants: None  Anesthesia: Topical with MAC  Indications: Painless, progressive loss of vision with compromise of daily activities.  Surgery: Cataract Extraction with Intraocular lens Implant Left Eye  Discription: The patient had dilating drops and viscous lidocaine placed into the Left eye in the pre-op holding area. After transfer to the operating room, a time out was performed. The patient was then prepped and draped. Beginning with a 48 degree blade a paracentesis port was made at the surgeon's 2 o'clock position. The anterior chamber was then filled with 1% non-preserved lidocaine with epinepherine. This was followed by filling the anterior chamber with Provisc.  A 2.43mm keratome blade was used to make a clear corneal incision at the temporal limbus.  A bent cystatome needle was used to create a continuous tear capsulotomy. Hydrodissection was performed with balanced salt solution on a Fine canula. The lens nucleus was then removed using the phacoemulsification handpiece. Residual cortex was removed with the I&A handpiece. The anterior chamber and capsular bag were refilled with Provisc. A posterior chamber intraocular lens was placed into the capsular bag with it's injector. The implant was positioned with the Kuglan hook. The Provisc was then removed from the anterior chamber and capsular bag with the I&A handpiece. Stromal hydration of the main incision and paracentesis port was performed with BSS on a Fine canula. The wounds were tested for leak which was negative. The patient tolerated the procedure well. There were no operative complications. The patient was then transferred to the recovery room in stable condition.  Complications: None  Specimen: None  EBL: None  Prosthetic device: Hoya iSert  250, power 21.0 D, SN [id number].

## 2016-01-14 NOTE — Anesthesia Postprocedure Evaluation (Signed)
Anesthesia Post Note  Patient: [name]  Procedure(s) Performed: Procedure(s) (LRB): CATARACT EXTRACTION PHACO AND INTRAOCULAR LENS PLACEMENT LEFT EYE; CDE:  8.03 (Left)  Patient location during evaluation: PACU Anesthesia Type: MAC Level of consciousness: awake and alert, oriented and patient cooperative Pain management: pain level controlled Vital Signs Assessment: post-procedure vital signs reviewed and stable Respiratory status: spontaneous breathing, nonlabored ventilation and respiratory function stable Cardiovascular status: blood pressure returned to baseline and stable Postop Assessment: no signs of nausea or vomiting Anesthetic complications: no    Last Vitals:  Vitals:   01/14/16 0750 01/14/16 0753  Pulse:  68  Temp: 36.4 C     Last Pain: There were no vitals filed for this visit.               Addis Bennie J

## 2016-01-14 NOTE — Discharge Instructions (Signed)
General Anesthesia, Adult, Care After Refer to this sheet in the next few weeks. These instructions provide you with information on caring for yourself after your procedure. Your health care provider may also give you more specific instructions. Your treatment has been planned according to current medical practices, but problems sometimes occur. Call your health care provider if you have any problems or questions after your procedure. WHAT TO EXPECT AFTER THE PROCEDURE After the procedure, it is typical to experience:  Sleepiness.  Nausea and vomiting. HOME CARE INSTRUCTIONS  For the first 24 hours after general anesthesia:  Have a responsible person with you.  Do not drive a car. If you are alone, do not take public transportation.  Do not drink alcohol.  Do not take medicine that has not been prescribed by your health care provider.  Do not sign important papers or make important decisions.  You may resume a normal diet and activities as directed by your health care provider.  Change bandages (dressings) as directed.  If you have questions or problems that seem related to general anesthesia, call the hospital and ask for the anesthetist or anesthesiologist on call. SEEK MEDICAL CARE IF:  You have nausea and vomiting that continue the day after anesthesia.  You develop a rash. SEEK IMMEDIATE MEDICAL CARE IF:   You have difficulty breathing.  You have chest pain.  You have any allergic problems.   This information is not intended to replace advice given to you by your health care provider. Make sure you discuss any questions you have with your health care provider.   Document Released: 09/12/2000 Document Revised: 06/27/2014 Document Reviewed: 10/05/2011 Elsevier Interactive Patient Education 2016 [place] Anesthesia, Adult General anesthesia is a sleep-like state of non-feeling produced by medicines (anesthetics). General anesthesia prevents you from being  alert and feeling pain during a medical procedure. Your caregiver may recommend general anesthesia if your procedure:  Is long.  Is painful or uncomfortable.  Would be frightening to see or hear.  Requires you to be still.  Affects your breathing.  Causes significant blood loss. LET YOUR CAREGIVER KNOW ABOUT:  Allergies to food or medicine.  Medicines taken, including vitamins, herbs, eyedrops, over-the-counter medicines, and creams.  Use of steroids (by mouth or creams).  Previous problems with anesthetics or numbing medicines, including problems experienced by relatives.  History of bleeding problems or blood clots.  Previous surgeries and types of anesthetics received.  Possibility of pregnancy, if this applies.  Use of cigarettes, alcohol, or illegal drugs.  Any health condition(s), especially diabetes, sleep apnea, and high blood pressure. RISKS AND COMPLICATIONS General anesthesia rarely causes complications. However, if complications do occur, they can be life threatening. Complications include:  A lung infection.  A stroke.  A heart attack.  Waking up during the procedure. When this occurs, the patient may be unable to move and communicate that he or she is awake. The patient may feel severe pain. Older adults and adults with serious medical problems are more likely to have complications than adults who are young and healthy. Some complications can be prevented by answering all of your caregiver's questions thoroughly and by following all pre-procedure instructions. It is important to tell your caregiver if any of the pre-procedure instructions, especially those related to diet, were not followed. Any food or liquid in the stomach can cause problems when you are under general anesthesia. BEFORE THE PROCEDURE  Ask your caregiver if you will have to spend the night at  the hospital. If you will not have to spend the night, arrange to have an adult drive you and stay  with you for 24 hours.  Follow your caregiver's instructions if you are taking dietary supplements or medicines. Your caregiver may tell you to stop taking them or to reduce your dosage.  Do not smoke for as long as possible before your procedure. If possible, stop smoking 3-6 weeks before the procedure.  Do not take new dietary supplements or medicines within 1 week of your procedure unless your caregiver approves them.  Do not eat within 8 hours of your procedure or as directed by your caregiver. Drink only clear liquids, such as water, black coffee (without milk or cream), and fruit juices (without pulp).  Do not drink within 3 hours of your procedure or as directed by your caregiver.  You may brush your teeth on the morning of the procedure, but make sure to spit out the toothpaste and water when finished. PROCEDURE  You will receive anesthetics through a mask, through an intravenous (IV) access tube, or through both. A doctor who specializes in anesthesia (anesthesiologist) or a nurse who specializes in anesthesia (nurse anesthetist) or both will stay with you throughout the procedure to make sure you remain unconscious. He or she will also watch your blood pressure, pulse, and oxygen levels to make sure that the anesthetics do not cause any problems. Once you are asleep, a breathing tube or mask may be used to help you breathe. AFTER THE PROCEDURE You will wake up after the procedure is complete. You may be in the room where the procedure was performed or in a recovery area. You may have a sore throat if a breathing tube was used. You may also feel:  Dizzy.  Weak.  Drowsy.  Confused.  Nauseous.  Cold. These are all normal responses and can be expected to last for up to 24 hours after the procedure is complete. A caregiver will tell you when you are ready to go home. This will usually be when you are fully awake and in stable condition.   This information is not intended to  replace advice given to you by your health care provider. Make sure you discuss any questions you have with your health care provider.   Document Released: 09/13/2007 Document Revised: 06/27/2014 Document Reviewed: 10/05/2011 Elsevier Interactive Patient Education [employer].

## 2016-01-15 ENCOUNTER — Other Ambulatory Visit: Payer: Self-pay | Admitting: Family

## 2016-01-15 NOTE — Telephone Encounter (Signed)
Please call in xanax with 1 refills 

## 2016-01-15 NOTE — Telephone Encounter (Signed)
rx called into pharmacy

## 2016-01-18 NOTE — Telephone Encounter (Signed)
Last filled 12/15/15, last seen 01/08/16. Christys pt. Route to pool, nurse call in at [facility]

## 2016-01-18 NOTE — Telephone Encounter (Signed)
Forward this Refill request to [facility] as she will be back tomorrow. Xanax

## 2016-01-18 NOTE — Telephone Encounter (Signed)
This was sent on 01/15/16

## 2016-01-18 NOTE — Telephone Encounter (Signed)
lmtcb

## 2016-01-20 ENCOUNTER — Ambulatory Visit (INDEPENDENT_AMBULATORY_CARE_PROVIDER_SITE_OTHER): Payer: Medicare Other | Admitting: *Deleted

## 2016-01-20 ENCOUNTER — Encounter (HOSPITAL_COMMUNITY): Payer: Self-pay | Admitting: Ophthalmology

## 2016-01-20 DIAGNOSIS — I420 Dilated cardiomyopathy: Secondary | ICD-10-CM | POA: Diagnosis not present

## 2016-01-20 NOTE — Progress Notes (Signed)
Remote ICD transmission.   

## 2016-01-26 ENCOUNTER — Other Ambulatory Visit: Payer: Self-pay | Admitting: Family

## 2016-01-26 DIAGNOSIS — I10 Essential (primary) hypertension: Secondary | ICD-10-CM

## 2016-01-27 ENCOUNTER — Encounter: Payer: Self-pay | Admitting: Cardiology

## 2016-02-09 LAB — CUP PACEART REMOTE DEVICE CHECK
Brady Statistic RA Percent Paced: 0 %
HighPow Impedance: 66 Ohm
Implantable Lead Implant Date: 20040312
Implantable Lead Location: 753858
Implantable Lead Location: 753859
Implantable Lead Location: 753860
Implantable Lead Model: 158
Implantable Lead Model: 4513
Implantable Lead Serial Number: 129158
Implantable Lead Serial Number: 209446
Implantable Lead Serial Number: 407264
Lead Channel Impedance Value: 627 Ohm
Lead Channel Impedance Value: 769 Ohm
Lead Channel Pacing Threshold Amplitude: 0.6 V
Lead Channel Pacing Threshold Amplitude: 2 V
Lead Channel Pacing Threshold Pulse Width: 0.4 ms
Lead Channel Setting Pacing Amplitude: 2 V
Lead Channel Setting Pacing Amplitude: 2.5 V
Lead Channel Setting Pacing Pulse Width: 0.8 ms
Lead Channel Setting Sensing Sensitivity: 1 mV
MDC IDC LEAD IMPLANT DT: 20040312
MDC IDC LEAD IMPLANT DT: 20040312
MDC IDC MSMT BATTERY REMAINING LONGEVITY: 96 mo
MDC IDC MSMT BATTERY REMAINING PERCENTAGE: 100 %
MDC IDC MSMT LEADCHNL LV PACING THRESHOLD PULSEWIDTH: 0.8 ms
MDC IDC MSMT LEADCHNL RA IMPEDANCE VALUE: 658 Ohm
MDC IDC MSMT LEADCHNL RA PACING THRESHOLD AMPLITUDE: 0.8 V
MDC IDC MSMT LEADCHNL RA PACING THRESHOLD PULSEWIDTH: 0.4 ms
MDC IDC SESS DTM: 20170802043100
MDC IDC SET LEADCHNL LV PACING AMPLITUDE: 2.8 V
MDC IDC SET LEADCHNL RV PACING PULSEWIDTH: 0.4 ms
MDC IDC SET LEADCHNL RV SENSING SENSITIVITY: 0.6 mV
MDC IDC STAT BRADY RV PERCENT PACED: 100 %
Pulse Gen Serial Number: 106298

## 2016-02-12 ENCOUNTER — Encounter: Payer: Self-pay | Admitting: Cardiology

## 2016-02-18 DIAGNOSIS — H5203 Hypermetropia, bilateral: Secondary | ICD-10-CM | POA: Diagnosis not present

## 2016-02-18 DIAGNOSIS — H524 Presbyopia: Secondary | ICD-10-CM | POA: Diagnosis not present

## 2016-02-18 DIAGNOSIS — H52223 Regular astigmatism, bilateral: Secondary | ICD-10-CM | POA: Diagnosis not present

## 2016-02-18 DIAGNOSIS — Z961 Presence of intraocular lens: Secondary | ICD-10-CM | POA: Diagnosis not present

## 2016-02-27 ENCOUNTER — Other Ambulatory Visit: Payer: Self-pay | Admitting: Family

## 2016-03-12 ENCOUNTER — Other Ambulatory Visit: Payer: Self-pay | Admitting: Family

## 2016-03-13 ENCOUNTER — Other Ambulatory Visit: Payer: Self-pay | Admitting: Nurse Practitioner

## 2016-03-15 ENCOUNTER — Ambulatory Visit (INDEPENDENT_AMBULATORY_CARE_PROVIDER_SITE_OTHER): Payer: Medicare Other | Admitting: Family

## 2016-03-15 ENCOUNTER — Encounter: Payer: Self-pay | Admitting: Family

## 2016-03-15 VITALS — BP 138/84 | HR 67 | Temp 96.9°F | Ht 68.0 in | Wt 136.2 lb

## 2016-03-15 DIAGNOSIS — I10 Essential (primary) hypertension: Secondary | ICD-10-CM | POA: Diagnosis not present

## 2016-03-15 DIAGNOSIS — I5022 Chronic systolic (congestive) heart failure: Secondary | ICD-10-CM

## 2016-03-15 DIAGNOSIS — F411 Generalized anxiety disorder: Secondary | ICD-10-CM

## 2016-03-15 DIAGNOSIS — E785 Hyperlipidemia, unspecified: Secondary | ICD-10-CM | POA: Diagnosis not present

## 2016-03-15 DIAGNOSIS — F17200 Nicotine dependence, unspecified, uncomplicated: Secondary | ICD-10-CM | POA: Diagnosis not present

## 2016-03-15 DIAGNOSIS — I25119 Atherosclerotic heart disease of native coronary artery with unspecified angina pectoris: Secondary | ICD-10-CM | POA: Diagnosis not present

## 2016-03-15 DIAGNOSIS — Z23 Encounter for immunization: Secondary | ICD-10-CM

## 2016-03-15 DIAGNOSIS — G4700 Insomnia, unspecified: Secondary | ICD-10-CM

## 2016-03-15 DIAGNOSIS — K219 Gastro-esophageal reflux disease without esophagitis: Secondary | ICD-10-CM | POA: Diagnosis not present

## 2016-03-15 DIAGNOSIS — J309 Allergic rhinitis, unspecified: Secondary | ICD-10-CM | POA: Diagnosis not present

## 2016-03-15 MED ORDER — ALPRAZOLAM 1 MG PO TABS: 1.0000 mg | ORAL_TABLET | Freq: Two times a day (BID) | ORAL | 3 refills | Status: DC | PRN

## 2016-03-15 MED ORDER — ALPRAZOLAM 1 MG PO TABS
1.0000 mg | ORAL_TABLET | Freq: Two times a day (BID) | ORAL | 3 refills | Status: DC | PRN
Start: 2016-03-15 — End: 2016-07-12

## 2016-03-15 NOTE — Progress Notes (Signed)
Subjective:    Patient ID: [name], male    DOB: June 07, 1941, 75 y.o.   MRN: [id number]  Pt presents to the office today for chronic follow up. Pt is followed by Cardiologists every year for CAD and CHF that is stable. Hyperlipidemia  This is a chronic problem. The current episode started more than 1 year ago. The problem is controlled. Recent lipid tests were reviewed and are normal. He has no history of diabetes or hypothyroidism. Factors aggravating his hyperlipidemia include smoking. Pertinent negatives include no leg pain or shortness of breath. Current antihyperlipidemic treatment includes statins. The current treatment provides mild improvement of lipids. Risk factors for coronary artery disease include dyslipidemia, family history, hypertension, male sex and a sedentary lifestyle.  Hypertension  This is a chronic problem. The current episode started more than 1 year ago. The problem has been resolved since onset. The problem is controlled. Associated symptoms include anxiety. Pertinent negatives include no malaise/fatigue, palpitations, peripheral edema or shortness of breath. Risk factors for coronary artery disease include dyslipidemia, male gender, smoking/tobacco exposure, sedentary lifestyle and family history. Past treatments include beta blockers and calcium channel blockers. The current treatment provides mild improvement. Hypertensive end-organ damage includes CAD/MI and heart failure. There is no history of kidney disease, CVA or a thyroid problem. There is no history of sleep apnea.  Gastroesophageal Reflux  He reports no belching, no choking, no heartburn or no wheezing. This is a chronic problem. The current episode started more than 1 year ago. The problem occurs rarely. The symptoms are aggravated by certain foods and smoking. Risk factors include smoking/tobacco exposure. He has tried a PPI for the symptoms. The treatment provided significant relief.  Anxiety  Presents  for follow-up visit. Onset was 1 to 6 months ago. The problem has been waxing and waning. Symptoms include excessive worry, insomnia and nervous/anxious behavior. Patient reports no decreased concentration, depressed mood, impotence, irritability, palpitations or shortness of breath. Symptoms occur occasionally. The symptoms are aggravated by family issues. The quality of sleep is good.   His past medical history is significant for anxiety/panic attacks and CAD. There is no history of depression. Past treatments include benzodiazephines. The treatment provided moderate relief. Compliance with prior treatments has been good.  Insomnia  Primary symptoms: difficulty falling asleep, no premature morning awakening, no malaise/fatigue, no napping.  The current episode started more than one year. The onset quality is gradual. The problem has been resolved since onset. The symptoms are aggravated by tobacco, anxiety and alcohol. Past treatments include medication. The treatment provided moderate relief. PMH includes: hypertension, depression.      Review of Systems  Constitutional: Negative.  Negative for irritability and malaise/fatigue.  HENT: Negative.   Respiratory: Negative.  Negative for choking, shortness of breath and wheezing.   Cardiovascular: Negative.  Negative for palpitations.  Gastrointestinal: Negative.  Negative for heartburn.  Endocrine: Negative.   Genitourinary: Negative.  Negative for impotence.  Musculoskeletal: Negative.   Neurological: Negative.   Hematological: Negative.   Psychiatric/Behavioral: Positive for depression. Negative for decreased concentration. The patient is nervous/anxious and has insomnia.   All other systems reviewed and are negative.      Objective:   Physical Exam  Constitutional: He is oriented to person, place, and time. He appears well-developed and well-nourished. No distress.  HENT:  Head: Normocephalic.  Right Ear: External ear normal.  Left  Ear: External ear normal.  Nose: Nose normal.  Mouth/Throat: Oropharynx is clear and moist.  Eyes:  Pupils are equal, round, and reactive to light. Right eye exhibits no discharge. Left eye exhibits no discharge.  Neck: Normal range of motion. Neck supple. No thyromegaly present.  Cardiovascular: Normal rate, regular rhythm, normal heart sounds and intact distal pulses.   No murmur heard. Pulmonary/Chest: Effort normal and breath sounds normal. No respiratory distress. He has no wheezes.  Abdominal: Soft. Bowel sounds are normal. He exhibits no distension. There is no tenderness.  Musculoskeletal: Normal range of motion. He exhibits no edema or tenderness.  Neurological: He is alert and oriented to person, place, and time. He has normal reflexes. No cranial nerve deficit.  Skin: Skin is warm and dry. No rash noted. No erythema.  Psychiatric: He has a normal mood and affect. His behavior is normal. Judgment and thought content normal.  Vitals reviewed.     BP 138/84   Pulse 67   Temp (!) 96.9 F (36.1 C) (Oral)   Ht '5\' 8"'  (1.727 m)   Wt 136 lb 3.2 oz (61.8 kg)   BMI 20.71 kg/m      Assessment & Plan:  1. Essential hypertension - CMP14+EGFR  2. Gastroesophageal reflux disease, esophagitis presence not specified - CMP14+EGFR  3. GAD (generalized anxiety disorder) - CMP14+EGFR - ALPRAZolam (XANAX) 1 MG tablet; Take 1 tablet (1 mg total) by mouth 2 (two) times daily as needed.  Dispense: 60 tablet; Refill: 3  4. TOBACCO ABUSE - CMP14+EGFR  5. Hyperlipidemia - CMP14+EGFR - Lipid panel  6. Insomnia - CMP14+EGFR  7. Allergic rhinitis, unspecified allergic rhinitis type - CMP14+EGFR  8. Chronic systolic heart failure (HCC) - CMP14+EGFR  9. Atherosclerosis of native coronary artery with angina pectoris, unspecified whether native or transplanted heart ([place]) - CMP14+EGFR   Continue all meds Labs pending Health Maintenance reviewed Diet and exercise encouraged RTO 3  months  Evelina Dun, FNP

## 2016-03-15 NOTE — Patient Instructions (Signed)
Stress and Stress Management Stress is a normal reaction to life events. It is what you feel when life demands more than you are used to or more than you can handle. Some stress can be useful. For example, the stress reaction can help you catch the last bus of the day, study for a test, or meet a deadline at work. But stress that occurs too often or for too long can cause problems. It can affect your emotional health and interfere with relationships and normal daily activities. Too much stress can weaken your immune system and increase your risk for physical illness. If you already have a medical problem, stress can make it worse. CAUSES  All sorts of life events may cause stress. An event that causes stress for one person may not be stressful for another person. Major life events commonly cause stress. These may be positive or negative. Examples include losing your job, moving into a new home, getting married, having a baby, or losing a loved one. Less obvious life events may also cause stress, especially if they occur day after day or in combination. Examples include working long hours, driving in traffic, caring for children, being in debt, or being in a difficult relationship. SIGNS AND SYMPTOMS Stress may cause emotional symptoms including, the following:  Anxiety. This is feeling worried, afraid, on edge, overwhelmed, or out of control.  Anger. This is feeling irritated or impatient.  Depression. This is feeling sad, down, helpless, or guilty.  Difficulty focusing, remembering, or making decisions. Stress may cause physical symptoms, including the following:   Aches and pains. These may affect your head, neck, back, stomach, or other areas of your body.  Tight muscles or clenched jaw.  Low energy or trouble sleeping. Stress may cause unhealthy behaviors, including the following:   Eating to feel better (overeating) or skipping meals.  Sleeping too little, too much, or both.  Working  too much or putting off tasks (procrastination).  Smoking, drinking alcohol, or using drugs to feel better. DIAGNOSIS  Stress is diagnosed through an assessment by your health care provider. Your health care provider will ask questions about your symptoms and any stressful life events.Your health care provider will also ask about your medical history and may order blood tests or other tests. Certain medical conditions and medicine can cause physical symptoms similar to stress. Mental illness can cause emotional symptoms and unhealthy behaviors similar to stress. Your health care provider may refer you to a mental health professional for further evaluation.  TREATMENT  Stress management is the recommended treatment for stress.The goals of stress management are reducing stressful life events and coping with stress in healthy ways.  Techniques for reducing stressful life events include the following:  Stress identification. Self-monitor for stress and identify what causes stress for you. These skills may help you to avoid some stressful events.  Time management. Set your priorities, keep a calendar of events, and learn to say "no." These tools can help you avoid making too many commitments. Techniques for coping with stress include the following:  Rethinking the problem. Try to think realistically about stressful events rather than ignoring them or overreacting. Try to find the positives in a stressful situation rather than focusing on the negatives.  Exercise. Physical exercise can release both physical and emotional tension. The key is to find a form of exercise you enjoy and do it regularly.  Relaxation techniques. These relax the body and mind. Examples include yoga, meditation, tai chi, biofeedback, deep  breathing, progressive muscle relaxation, listening to music, being out in nature, journaling, and other hobbies. Again, the key is to find one or more that you enjoy and can do  regularly.  Healthy lifestyle. Eat a balanced diet, get plenty of sleep, and do not smoke. Avoid using alcohol or drugs to relax.  Strong support network. Spend time with family, friends, or other people you enjoy being around.Express your feelings and talk things over with someone you trust. Counseling or talktherapy with a mental health professional may be helpful if you are having difficulty managing stress on your own. Medicine is typically not recommended for the treatment of stress.Talk to your health care provider if you think you need medicine for symptoms of stress. HOME CARE INSTRUCTIONS  Keep all follow-up visits as directed by your health care provider.  Take all medicines as directed by your health care provider. SEEK MEDICAL CARE IF:  Your symptoms get worse or you start having new symptoms.  You feel overwhelmed by your problems and can no longer manage them on your own. SEEK IMMEDIATE MEDICAL CARE IF:  You feel like hurting yourself or someone else.   This information is not intended to replace advice given to you by your health care provider. Make sure you discuss any questions you have with your health care provider.   Document Released: 11/30/2000 Document Revised: 06/27/2014 Document Reviewed: 01/29/2013 Elsevier Interactive Patient Education 2016 Elsevier Inc[employer]  

## 2016-03-16 LAB — LIPID PANEL
CHOL/HDL RATIO: 2 ratio (ref 0.0–5.0)
Cholesterol, Total: 217 mg/dL — ABNORMAL HIGH (ref 100–199)
HDL: 109 mg/dL (ref >39)
LDL Calculated: 97 mg/dL (ref 0–99)
Triglycerides: 54 mg/dL (ref 0–149)
VLDL CHOLESTEROL CAL: 11 mg/dL (ref 5–40)

## 2016-03-16 LAB — CMP14+EGFR
A/G RATIO: 1.7 (ref 1.2–2.2)
ALBUMIN: 4.9 g/dL — AB (ref 3.5–4.8)
ALT: 27 IU/L (ref 0–44)
AST: 37 IU/L (ref 0–40)
Alkaline Phosphatase: 75 IU/L (ref 39–117)
BILIRUBIN TOTAL: 0.5 mg/dL (ref 0.0–1.2)
BUN / CREAT RATIO: 11 (ref 10–24)
BUN: 11 mg/dL (ref 8–27)
CALCIUM: 10.1 mg/dL (ref 8.6–10.2)
CHLORIDE: 89 mmol/L — AB (ref 96–106)
CO2: 23 mmol/L (ref 18–29)
Creatinine, Ser: 0.99 mg/dL (ref 0.76–1.27)
GFR, EST AFRICAN AMERICAN: 88 mL/min/{1.73_m2} (ref >59)
GFR, EST NON AFRICAN AMERICAN: 76 mL/min/{1.73_m2} (ref >59)
GLOBULIN, TOTAL: 2.9 g/dL (ref 1.5–4.5)
Glucose: 104 mg/dL — ABNORMAL HIGH (ref 65–99)
POTASSIUM: 5.3 mmol/L — AB (ref 3.5–5.2)
Sodium: 129 mmol/L — ABNORMAL LOW (ref 134–144)
TOTAL PROTEIN: 7.8 g/dL (ref 6.0–8.5)

## 2016-04-02 ENCOUNTER — Encounter: Payer: Self-pay | Admitting: Family Medicine

## 2016-04-02 ENCOUNTER — Ambulatory Visit (INDEPENDENT_AMBULATORY_CARE_PROVIDER_SITE_OTHER): Payer: Medicare Other | Admitting: Family Medicine

## 2016-04-02 VITALS — BP 133/77 | HR 67 | Temp 97.0°F | Ht 68.0 in | Wt 135.4 lb

## 2016-04-02 DIAGNOSIS — L03039 Cellulitis of unspecified toe: Secondary | ICD-10-CM | POA: Diagnosis not present

## 2016-04-02 MED ORDER — MOMETASONE FUROATE 0.1 % EX CREA: 1.0000 "application " | TOPICAL_CREAM | Freq: Two times a day (BID) | CUTANEOUS | 0 refills | Status: DC

## 2016-04-02 NOTE — Patient Instructions (Signed)
Great to see you!  I recommend using the steroid cream and over the counter lamisil twice daily for 2 weeks then coming back  Try foot soaks and then apply the cream twice daily

## 2016-04-02 NOTE — Progress Notes (Signed)
   HPI  Patient presents today with swollen red toes.  Patient explains that he's had some irritation and itching of the toes for about 2 months. He was started on a toenail lacquer for onychomycosis.  Over the last 2 weeks it seems to be getting worse with clear and white drainage specifically of the great toes and irritation and redness with mild swelling around the toenail plates on all 10 toes.  He denies any fever, chills, sweats.  He states that he has sweaty feet which she struggles with chronically.   PMH: Smoking status noted ROS: Per HPI  Objective: BP 133/77   Pulse 67   Temp 97 F (36.1 C) (Oral)   Ht 5\' 8"  (1.727 m)   Wt 135 lb 6 oz (61.4 kg)   BMI 20.58 kg/m  Gen: NAD, alert, cooperative with exam HEENT: NCAT CV: RRR, good S1/S2, no murmur Resp: CTABL, no wheezes, non-labored Neuro: Alert and oriented, No gross deficits Skin All 10 toes with erythema and mild swelling around the toenail plates, it's worse on the great toes, bilaterally he has been some minimal amount of white discharge at the toenail plates, erythema, but no concerning warmth. No signs of bacterial infection, fluctuance, induration, or warmth.  Assessment and plan:  # Chronic paronychia Treat with high potency steroid plus Lamisil twice daily, after Epsom salt soaks to help dry out the area. Recommended spending several hours a day bare footedto let his feet dry off RTC in 2 weeks. If no improvement see podiatry     Meds ordered this encounter  Medications  . mometasone (ELOCON) 0.1 % cream    Sig: Apply 1 application topically 2 (two) times daily.    Dispense:  45 g    Refill:  Hazen, MD [place] 04/02/2016, 10:24 AM

## 2016-04-09 ENCOUNTER — Other Ambulatory Visit: Payer: Self-pay | Admitting: Family

## 2016-04-20 ENCOUNTER — Ambulatory Visit (INDEPENDENT_AMBULATORY_CARE_PROVIDER_SITE_OTHER): Payer: Medicare Other | Admitting: *Deleted

## 2016-04-20 DIAGNOSIS — I420 Dilated cardiomyopathy: Secondary | ICD-10-CM | POA: Diagnosis not present

## 2016-04-20 NOTE — Progress Notes (Signed)
Remote ICD transmission.   

## 2016-04-24 ENCOUNTER — Other Ambulatory Visit: Payer: Self-pay | Admitting: Family Medicine

## 2016-04-24 DIAGNOSIS — I10 Essential (primary) hypertension: Secondary | ICD-10-CM

## 2016-04-27 ENCOUNTER — Encounter: Payer: Self-pay | Admitting: Cardiology

## 2016-05-11 ENCOUNTER — Encounter: Payer: Self-pay | Admitting: Cardiology

## 2016-05-16 ENCOUNTER — Ambulatory Visit (INDEPENDENT_AMBULATORY_CARE_PROVIDER_SITE_OTHER): Payer: Medicare Other | Admitting: Family

## 2016-05-16 ENCOUNTER — Encounter: Payer: Self-pay | Admitting: Family

## 2016-05-16 VITALS — BP 135/86 | HR 63 | Temp 96.9°F | Ht 68.0 in | Wt 134.0 lb

## 2016-05-16 DIAGNOSIS — B353 Tinea pedis: Secondary | ICD-10-CM | POA: Diagnosis not present

## 2016-05-16 MED ORDER — NAFTIFINE HCL 1 % EX GEL: CUTANEOUS | 1 refills | Status: DC

## 2016-05-16 NOTE — Patient Instructions (Signed)
Athlete's Foot Introduction Athlete's foot (tinea pedis) is a fungal infection of the skin on the feet. It often occurs on the skin that is between or underneath the toes. It can also occur on the soles of the feet. The infection can spread from person to person (is contagious). What are the causes? Athlete's foot is caused by a fungus. This fungus grows in warm, moist places. Most people get athlete's foot by sharing shower stalls, towels, and wet floors with someone who is infected. Not washing your feet or changing your socks often enough can contribute to athlete's foot. What increases the risk? This condition is more likely to develop in:  Men.  People who have a weak body defense system (immune system).  People who have diabetes.  People who use public showers, such as at a gym.  People who wear heavy-duty shoes, such as Environmental manager.  Seasons with warm, humid weather. What are the signs or symptoms? Symptoms of this condition include:  Itchy areas between the toes or on the soles of the feet.  White, flaky, or scaly areas between the toes or on the soles of the feet.  Very itchy small blisters between the toes or on the soles of the feet.  Small cuts on the skin. These cuts can become infected.  Thick or discolored toenails. How is this diagnosed? This condition is diagnosed with a medical history and physical exam. Your health care provider may also take a skin or toenail sample to be examined. How is this treated? Treatment for this condition includes antifungal medicines. These may be applied as powders, ointments, or creams. In severe cases, an oral antifungal medicine may be given. Follow these instructions at home:  Apply or take over-the-counter and prescription medicines only as told by your health care provider.  Keep all follow-up visits as told by your health care provider. This is important.  Do not scratch your feet.  Keep your feet  dry:  Wear cotton or wool socks. Change your socks every day or if they become wet.  Wear shoes that allow air to circulate, such as sandals or canvas tennis shoes.  Wash and dry your feet:  Every day or as told by your health care provider.  After exercising.  Including the area between your toes.  Do not share towels, nail clippers, or other personal items that touch your feet with others.  If you have diabetes, keep your blood sugar under control. How is this prevented?  Do not share towels.  Wear sandals in wet areas, such as locker rooms and shared showers.  Keep your feet dry:  Wear cotton or wool socks. Change your socks every day or if they become wet.  Wear shoes that allow air to circulate, such as sandals or canvas tennis shoes.  Wash and dry your feet after exercising. Pay attention to the area between your toes. Contact a health care provider if:  You have a fever.  You have swelling, soreness, warmth, or redness in your foot.  You are not getting better with treatment.  Your symptoms get worse.  You have new symptoms. This information is not intended to replace advice given to you by your health care provider. Make sure you discuss any questions you have with your health care provider. Document Released: 06/03/2000 Document Revised: 11/12/2015 Document Reviewed: 12/08/2014  2017 Elsevier

## 2016-05-16 NOTE — Progress Notes (Signed)
   Subjective:    Patient ID: [name], male    DOB: 06/30/1940, 75 y.o.   MRN: [id number]  HPI PT presents to the office today with bilateral great toe pain. Pt states he has a constant aching 6 out 10. Pt states the pain has improved, because last week he could only wear sandals and could not wear anything that touched his toes. Pt has tried soaking in epsom salt and rest with mild relief.     Review of Systems  Skin: Positive for rash.  All other systems reviewed and are negative.      Objective:   Physical Exam  Constitutional: He is oriented to person, place, and time. He appears well-developed and well-nourished.  Cardiovascular: Intact distal pulses.   Pulmonary/Chest: Effort normal.  Neurological: He is alert and oriented to person, place, and time.  Skin: Skin is warm and dry. Rash noted.  Erythemas fungus rash on bilateral toes.      Blood pressure 135/86, pulse 63, temperature (!) 96.9 F (36.1 C), temperature source Oral, height 5\' 8"  (1.727 m), weight 134 lb (60.8 kg).  s     Assessment & Plan:  1. Tinea pedis of both feet -Keep clean and dry -Do not scratch feet -Change socks daily -RTO if does not improve or becomes worse - Naftifine HCl 1 % GEL; Twice daily  for 4 weeks  Dispense: 180 g; Refill: Roby, FNP

## 2016-05-29 ENCOUNTER — Other Ambulatory Visit: Payer: Self-pay | Admitting: Family

## 2016-06-01 LAB — CUP PACEART REMOTE DEVICE CHECK
Battery Remaining Percentage: 100 %
HighPow Impedance: 63 Ohm
Implantable Lead Implant Date: 20040312
Implantable Lead Location: 753860
Implantable Lead Model: 158
Implantable Lead Model: 4087
Implantable Lead Model: 4513
Implantable Lead Serial Number: 209446
Implantable Lead Serial Number: 407264
Lead Channel Impedance Value: 633 Ohm
Lead Channel Pacing Threshold Pulse Width: 0.4 ms
Lead Channel Pacing Threshold Pulse Width: 0.8 ms
Lead Channel Setting Pacing Amplitude: 2 V
Lead Channel Setting Pacing Amplitude: 2.8 V
Lead Channel Setting Pacing Pulse Width: 0.4 ms
Lead Channel Setting Pacing Pulse Width: 0.8 ms
MDC IDC LEAD IMPLANT DT: 20040312
MDC IDC LEAD IMPLANT DT: 20040312
MDC IDC LEAD LOCATION: 753858
MDC IDC LEAD LOCATION: 753859
MDC IDC LEAD SERIAL: 129158
MDC IDC MSMT BATTERY REMAINING LONGEVITY: 90 mo
MDC IDC MSMT LEADCHNL LV IMPEDANCE VALUE: 775 Ohm
MDC IDC MSMT LEADCHNL LV PACING THRESHOLD AMPLITUDE: 2 V
MDC IDC MSMT LEADCHNL RA IMPEDANCE VALUE: 652 Ohm
MDC IDC MSMT LEADCHNL RA PACING THRESHOLD AMPLITUDE: 0.8 V
MDC IDC MSMT LEADCHNL RV PACING THRESHOLD AMPLITUDE: 0.6 V
MDC IDC MSMT LEADCHNL RV PACING THRESHOLD PULSEWIDTH: 0.4 ms
MDC IDC PG IMPLANT DT: 20150917
MDC IDC PG SERIAL: 106298
MDC IDC SESS DTM: 20171101043100
MDC IDC SET LEADCHNL LV SENSING SENSITIVITY: 1 mV
MDC IDC SET LEADCHNL RV PACING AMPLITUDE: 2.5 V
MDC IDC SET LEADCHNL RV SENSING SENSITIVITY: 0.6 mV
MDC IDC STAT BRADY RA PERCENT PACED: 0 %
MDC IDC STAT BRADY RV PERCENT PACED: 100 %

## 2016-06-11 ENCOUNTER — Other Ambulatory Visit: Payer: Self-pay | Admitting: Family

## 2016-06-30 ENCOUNTER — Ambulatory Visit (INDEPENDENT_AMBULATORY_CARE_PROVIDER_SITE_OTHER): Payer: Medicare Other | Admitting: Pediatrics

## 2016-06-30 ENCOUNTER — Ambulatory Visit (INDEPENDENT_AMBULATORY_CARE_PROVIDER_SITE_OTHER): Payer: Medicare Other

## 2016-06-30 ENCOUNTER — Encounter: Payer: Self-pay | Admitting: Pediatrics

## 2016-06-30 VITALS — BP 119/68 | HR 67 | Temp 97.3°F | Resp 18 | Ht 68.0 in | Wt 136.6 lb

## 2016-06-30 DIAGNOSIS — B351 Tinea unguium: Secondary | ICD-10-CM

## 2016-06-30 DIAGNOSIS — J181 Lobar pneumonia, unspecified organism: Secondary | ICD-10-CM | POA: Diagnosis not present

## 2016-06-30 DIAGNOSIS — B001 Herpesviral vesicular dermatitis: Secondary | ICD-10-CM

## 2016-06-30 DIAGNOSIS — R059 Cough, unspecified: Secondary | ICD-10-CM

## 2016-06-30 DIAGNOSIS — R05 Cough: Secondary | ICD-10-CM

## 2016-06-30 DIAGNOSIS — J189 Pneumonia, unspecified organism: Secondary | ICD-10-CM

## 2016-06-30 MED ORDER — DOXYCYCLINE HYCLATE 100 MG PO TABS: 100.0000 mg | ORAL_TABLET | Freq: Two times a day (BID) | ORAL | 0 refills | Status: DC

## 2016-06-30 MED ORDER — VALACYCLOVIR HCL 1 G PO TABS: 1000.0000 mg | ORAL_TABLET | Freq: Two times a day (BID) | ORAL | 0 refills | Status: DC

## 2016-06-30 NOTE — Progress Notes (Signed)
  Subjective:   Patient ID: ALEXSANDER STAIB, male    DOB: [birth year], 76 y.o.   MRN: [id number] CC: Cough (3-4 days); Chest Congestion; and No Appetite  HPI: BRANSYN ZUNICH is a 76 y.o. male presenting for Cough (3-4 days); Chest Congestion; and No Appetite  Started 4 days ago Wheezing some Coughing a lot Lots of nasal congestion today No SOB No fevers  Feet have been bothering him, not able to cut toenails anymore with thickened nails, great tor L foot hurting  Non-healing cold sore for past few months  Relevant past medical, surgical, family and social history reviewed. Allergies and medications reviewed and updated. History  Smoking Status  . Light Tobacco Smoker  . Packs/day: 0.25  . Years: 45.00  . Types: Cigarettes  Smokeless Tobacco  . Former Systems developer  . Types: Chew  . Quit date: 06/20/1989    Comment: he is not interested in cessation   ROS: Per HPI   Objective:    BP 119/68   Pulse 67   Temp 97.3 F (36.3 C) (Oral)   Resp 18   Ht 5\' 8"  (1.727 m)   Wt 136 lb 9.6 oz (62 kg)   SpO2 97%   BMI 20.77 kg/m   Wt Readings from Last 3 Encounters:  06/30/16 136 lb 9.6 oz (62 kg)  05/16/16 134 lb (60.8 kg)  04/02/16 135 lb 6 oz (61.4 kg)    Gen: NAD, alert, cooperative with exam, NCAT EYES: EOMI, no conjunctival injection, or no icterus ENT:  TMs pearly gray b/l, OP without erythema, R lower lip with shallow erosion, scant green discharge on surface LYMPH: no cervical LAD CV: NRRR, normal S1/S2, no murmur, distal pulses 2+ b/l Resp: rhonchi present lower lobes, no wheezes, normal WOB Abd: +BS, soft, NTND.  Ext: No edema, warm Neuro: Alert and oriented MSK: normal muscle bulk Skin: thickened yellow toenails throughout, L great toe TTP around nail, no redness or swelling,   Assessment & Plan:  Reyhan was seen today for cough, chest congestion and no appetite.  Diagnoses and all orders for this visit:  Cough CXR without discrete infiltrate, given exam and  symptoms will treat as below for CAP -     DG Chest 2 View; Future  Community acquired pneumonia of right lower lobe of lung (HCC) -     doxycycline (VIBRA-TABS) 100 MG tablet; Take 1 tablet (100 mg total) by mouth 2 (two) times daily.  Herpes labialis Treat with below, if not improving let me know, will refer to ENT for biopsy -     valACYclovir (VALTREX) 1000 MG tablet; Take 1 tablet (1,000 mg total) by mouth 2 (two) times daily.  Onychomycosis Not able to cut toenails to fit in shoes he wants to wear, rubbing skin on toes -     Ambulatory referral to Podiatry   Follow up plan: prn Assunta Found, MD [place]

## 2016-07-01 DIAGNOSIS — B351 Tinea unguium: Secondary | ICD-10-CM | POA: Insufficient documentation

## 2016-07-12 ENCOUNTER — Other Ambulatory Visit: Payer: Self-pay | Admitting: Family

## 2016-07-12 DIAGNOSIS — F411 Generalized anxiety disorder: Secondary | ICD-10-CM

## 2016-07-13 NOTE — Telephone Encounter (Signed)
Patient last seen in office on 06-30-16. Rx last filled on 06-12-16. Please advise

## 2016-07-14 ENCOUNTER — Other Ambulatory Visit: Payer: Self-pay | Admitting: Family

## 2016-07-14 DIAGNOSIS — F411 Generalized anxiety disorder: Secondary | ICD-10-CM

## 2016-07-14 NOTE — Telephone Encounter (Signed)
Refill called to [facility] 

## 2016-07-15 MED ORDER — ALPRAZOLAM 1 MG PO TABS: 1.0000 mg | ORAL_TABLET | Freq: Two times a day (BID) | ORAL | 4 refills | Status: DC | PRN

## 2016-07-15 NOTE — Addendum Note (Signed)
Addended by: Evelina Dun A on: 07/15/2016 10:56 AM   Modules accepted: Orders

## 2016-07-15 NOTE — Telephone Encounter (Signed)
Xanax refill called to [facility].

## 2016-07-15 NOTE — Telephone Encounter (Signed)
Forward to PCP.

## 2016-07-20 ENCOUNTER — Ambulatory Visit (INDEPENDENT_AMBULATORY_CARE_PROVIDER_SITE_OTHER): Payer: Medicare Other | Admitting: *Deleted

## 2016-07-20 ENCOUNTER — Ambulatory Visit: Payer: Medicare Other | Admitting: Pediatrics

## 2016-07-20 DIAGNOSIS — I428 Other cardiomyopathies: Secondary | ICD-10-CM

## 2016-07-20 NOTE — Progress Notes (Signed)
Remote ICD transmission.   

## 2016-07-21 ENCOUNTER — Encounter: Payer: Self-pay | Admitting: Cardiology

## 2016-07-21 DIAGNOSIS — B351 Tinea unguium: Secondary | ICD-10-CM | POA: Diagnosis not present

## 2016-07-21 DIAGNOSIS — M79676 Pain in unspecified toe(s): Secondary | ICD-10-CM | POA: Diagnosis not present

## 2016-07-22 ENCOUNTER — Ambulatory Visit: Payer: Medicare Other | Admitting: Pediatrics

## 2016-07-27 ENCOUNTER — Encounter: Payer: Self-pay | Admitting: Pediatrics

## 2016-07-27 ENCOUNTER — Ambulatory Visit (INDEPENDENT_AMBULATORY_CARE_PROVIDER_SITE_OTHER): Payer: Medicare Other | Admitting: Pediatrics

## 2016-07-27 ENCOUNTER — Ambulatory Visit (INDEPENDENT_AMBULATORY_CARE_PROVIDER_SITE_OTHER): Payer: Medicare Other

## 2016-07-27 VITALS — BP 124/69 | HR 52 | Temp 96.7°F | Ht 68.0 in | Wt 136.0 lb

## 2016-07-27 DIAGNOSIS — F17200 Nicotine dependence, unspecified, uncomplicated: Secondary | ICD-10-CM

## 2016-07-27 DIAGNOSIS — I700 Atherosclerosis of aorta: Secondary | ICD-10-CM

## 2016-07-27 DIAGNOSIS — J189 Pneumonia, unspecified organism: Secondary | ICD-10-CM

## 2016-07-27 DIAGNOSIS — F411 Generalized anxiety disorder: Secondary | ICD-10-CM

## 2016-07-27 DIAGNOSIS — J181 Lobar pneumonia, unspecified organism: Secondary | ICD-10-CM | POA: Diagnosis not present

## 2016-07-27 DIAGNOSIS — I10 Essential (primary) hypertension: Secondary | ICD-10-CM

## 2016-07-27 DIAGNOSIS — R634 Abnormal weight loss: Secondary | ICD-10-CM

## 2016-07-27 NOTE — Progress Notes (Unsigned)
  Subjective:   Patient ID: DAYVON LARUSSA, male    DOB: 1941-05-21, 76 y.o.   MRN: [id number] CC: 4 week follow up  HPI: [name] is a 76 y.o. male presenting for 4 week follow up CAP  Breathing back to normal Feeling well Took apprx 4 days of coughing, feeling ill before back to normal Taking meds daily No CP, no SOB Rarely feels dizzy or lightheaded when standing up  Smoking 3-4 cig a day Interested in quitting, chantix didn't work in the past   Relevant past medical, surgical, family and social history reviewed. Allergies and medications reviewed and updated. History  Smoking Status  . Light Tobacco Smoker  . Packs/day: 0.25  . Years: 45.00  . Types: Cigarettes  Smokeless Tobacco  . Former Systems developer  . Types: Chew  . Quit date: 06/20/1989    Comment: he is not interested in cessation   ROS: Per HPI   Objective:    There were no vitals taken for this visit.  Wt Readings from Last 3 Encounters:  07/27/16 136 lb (61.7 kg)  06/30/16 136 lb 9.6 oz (62 kg)  05/16/16 134 lb (60.8 kg)    *** Gen: NAD, alert, cooperative with exam, NCAT EYES: EOMI, no conjunctival injection, or no icterus ENT:  TMs pearly gray b/l, OP without erythema LYMPH: no cervical LAD CV: NRRR, normal S1/S2, no murmur, distal pulses 2+ b/l Resp: CTABL, no wheezes, normal WOB Abd: +BS, soft, NTND. no guarding or organomegaly Ext: No edema, warm Neuro: Alert and oriented, strength equal b/l UE and LE, coordination grossly normal MSK: normal muscle bulk  Assessment & Plan:  Diagnoses and all orders for this visit:  Essential hypertension  Community acquired pneumonia of right lower lobe of lung ([place]) -     DG Chest 2 View  Thoracic aorta atherosclerosis ([place])  TOBACCO ABUSE   Follow up plan: No Follow-up on file. Assunta Found, MD [place]

## 2016-07-27 NOTE — Progress Notes (Signed)
  Subjective:   Patient ID: [name], male    DOB: 09/23/1940, 76 y.o.   MRN: [id number] CC: 4 week recheck  HPI: [name] is a 76 y.o. male presenting for 4 week recheck  Took about 4 days to get better after that Appetite is good Says he doesn't eat like he should, sometimes cant come up with what he wants to eat Says he doesn't cook now, if he was around his relatives who cook a lot he would eat more Thinks he has been losing weight Enjoying metal detecting at times in [place]   No chest pain, no SOB  Occasional lightheadedness when he stands up, hasnt happened in a long time  Continues to smoke daily, a few cig Interested in quitting, says it has been hard, tried chantix in past "made him crazy"   Relevant past medical, surgical, family and social history reviewed. Allergies and medications reviewed and updated. History  Smoking Status  . Light Tobacco Smoker  . Packs/day: 0.25  . Years: 45.00  . Types: Cigarettes  Smokeless Tobacco  . Former Systems developer  . Types: Chew  . Quit date: 06/20/1989    Comment: he is not interested in cessation   ROS: Per HPI   Objective:    BP 124/69   Pulse (!) 52   Temp (!) 96.7 F (35.9 C) (Oral)   Ht 5\' 8"  (1.727 m)   Wt 136 lb (61.7 kg)   BMI 20.68 kg/m   Wt Readings from Last 3 Encounters:  07/27/16 136 lb (61.7 kg)  06/30/16 136 lb 9.6 oz (62 kg)  05/16/16 134 lb (60.8 kg)    Gen: NAD, alert, cooperative with exam, NCAT EYES: EOMI, no conjunctival injection, or no icterus ENT: OP without erythema LYMPH: no cervical LAD CV: RR, bradycardic, normal S1/S2, no murmur Resp: CTABL, no wheezes, normal WOB Ext: No edema, warm Neuro: Alert and oriented MSK: normal muscle bulk  Assessment & Plan:  Tre was seen today for 4 week recheck.  Diagnoses and all orders for this visit:  Community acquired pneumonia of right lower lobe of lung ([place]) Symptoms resolved -     DG Chest 2 View; Future  Essential  hypertension Adequate control, cont current meds  TOBACCO ABUSE Cont to encourage cessation, pt open to cessation, not ready yet  GAD (generalized anxiety disorder) Stable, Has refills of xanax from PCP, due for f/u in 3 months  Weight decrease Appetite fine, not cooking regularly, sometimes not interested in foods that are around, thinks he should weigh more Weight has been stable past few years  Follow up plan: Return in about 3 months (around 10/24/2016) for with [facility] for follow up. Assunta Found, MD [place]

## 2016-08-02 LAB — CUP PACEART REMOTE DEVICE CHECK
Battery Remaining Longevity: 90 mo
Brady Statistic RV Percent Paced: 100 %
Date Time Interrogation Session: 20180131053100
HighPow Impedance: 63 Ohm
Implantable Lead Implant Date: 20040312
Implantable Lead Implant Date: 20040312
Implantable Lead Implant Date: 20040312
Implantable Lead Location: 753858
Implantable Lead Location: 753859
Implantable Lead Model: 4087
Implantable Pulse Generator Implant Date: 20150917
Lead Channel Impedance Value: 792 Ohm
Lead Channel Pacing Threshold Amplitude: 0.6 V
Lead Channel Pacing Threshold Amplitude: 2 V
Lead Channel Pacing Threshold Pulse Width: 0.4 ms
Lead Channel Pacing Threshold Pulse Width: 0.8 ms
Lead Channel Setting Pacing Amplitude: 2.5 V
Lead Channel Setting Pacing Amplitude: 2.8 V
Lead Channel Setting Pacing Pulse Width: 0.8 ms
Lead Channel Setting Sensing Sensitivity: 0.6 mV
MDC IDC LEAD LOCATION: 753860
MDC IDC LEAD SERIAL: 129158
MDC IDC LEAD SERIAL: 209446
MDC IDC LEAD SERIAL: 407264
MDC IDC MSMT BATTERY REMAINING PERCENTAGE: 100 %
MDC IDC MSMT LEADCHNL RA IMPEDANCE VALUE: 605 Ohm
MDC IDC MSMT LEADCHNL RA PACING THRESHOLD AMPLITUDE: 0.8 V
MDC IDC MSMT LEADCHNL RA PACING THRESHOLD PULSEWIDTH: 0.4 ms
MDC IDC MSMT LEADCHNL RV IMPEDANCE VALUE: 626 Ohm
MDC IDC PG SERIAL: 106298
MDC IDC SET LEADCHNL LV SENSING SENSITIVITY: 1 mV
MDC IDC SET LEADCHNL RA PACING AMPLITUDE: 2 V
MDC IDC SET LEADCHNL RV PACING PULSEWIDTH: 0.4 ms
MDC IDC STAT BRADY RA PERCENT PACED: 0 %

## 2016-08-04 ENCOUNTER — Encounter: Payer: Self-pay | Admitting: Cardiology

## 2016-08-27 ENCOUNTER — Other Ambulatory Visit: Payer: Self-pay | Admitting: Family

## 2016-09-03 ENCOUNTER — Other Ambulatory Visit: Payer: Self-pay | Admitting: Family

## 2016-10-09 ENCOUNTER — Other Ambulatory Visit: Payer: Self-pay | Admitting: Family

## 2016-10-10 NOTE — Progress Notes (Signed)
Electrophysiology Office Note Date: 10/18/2016  ID:  [name], [name] July 18, 1940, MRN [id number]  PCP: Evelina Dun, FNP Primary Cardiologist: Hochrein Electrophysiologist: Allred  CC: Routine ICD follow-up  [name] is a 76 y.o. male seen today for Dr Rayann Heman.  He presents today for routine electrophysiology followup.  Since last being seen in our clinic, the patient reports doing reasonably well. He has recently had his teeth pulled and is waiting for dentures next week. Because of this, he hasn't been eating as well at home. He denies chest pain, palpitations, dyspnea, PND, orthopnea, nausea, vomiting, dizziness, syncope, edema, weight gain, or early satiety.  He has not had ICD shocks.   Device History: BSX CRTD implanted 2004 for NICM, CHF; gen change 2015 History of appropriate therapy: No History of AAD therapy: No   Past Medical History:  Diagnosis Date  . Arthritis   . CAD (coronary artery disease)    Nonobstructive cath 2003  . Carotid artery disease ([place])   . COPD (chronic obstructive pulmonary disease) ([place])   . Depression   . Dyslipidemia   . EtOH dependence ([place])   . Hyperlipidemia   . Hypertension   . Insomnia   . Nonischemic cardiomyopathy ([place])    EF was 20% (60% last echo 2012)  . Pacemaker    and defibrillator  . Peptic ulcer disease   . Tobacco abuse    Past Surgical History:  Procedure Laterality Date  . BIV ICD GENERTAOR CHANGE OUT N/A 03/06/2014   [employer] Gen change by Dr Rayann Heman Beckie Salts CRT-D with LV1 header)  . biventricular defibrillator implantation     [employer] and pacemaker  . CATARACT EXTRACTION W/PHACO Right 12/10/2015   Procedure: CATARACT EXTRACTION PHACO AND INTRAOCULAR LENS PLACEMENT RIGHT EYE; CDE: 19.30;  Surgeon: Tonny Branch, MD;  Location: [facility];  Service: Ophthalmology;  Laterality: Right;  . CATARACT EXTRACTION W/PHACO Left 01/14/2016   Procedure: CATARACT EXTRACTION PHACO AND INTRAOCULAR LENS  PLACEMENT LEFT EYE; CDE:  8.03;  Surgeon: Tonny Branch, MD;  Location: [facility];  Service: Ophthalmology;  Laterality: Left;  . CORONARY STENT PLACEMENT    . Ulcer surgery     repair of stomach ulcer    Current Outpatient Prescriptions  Medication Sig Dispense Refill  . ALPRAZolam (XANAX) 1 MG tablet Take 1 tablet (1 mg total) by mouth 2 (two) times daily as needed. 60 tablet 4  . amLODipine (NORVASC) 10 MG tablet TAKE ONE TABLET BY MOUTH ONCE DAILY 90 tablet 0  . aspirin 81 MG tablet Take 81 mg by mouth daily.    . cetirizine (ZYRTEC) 10 MG tablet Take 10 mg by mouth daily as needed for allergies.    . Clobetasol Prop Emollient Base (CLOBETASOL PROPIONATE E) 0.05 % emollient cream Apply twice a day to rash 60 g 1  . Efinaconazole 10 % SOLN Apply daily to nails 8 mL 3  . lisinopril (PRINIVIL,ZESTRIL) 40 MG tablet TAKE ONE TABLET BY MOUTH ONCE DAILY 90 tablet 1  . metoprolol (LOPRESSOR) 100 MG tablet TAKE ONE TABLET BY MOUTH TWICE DAILY FOR HIGH BLOOD PRESSURE CONTROL 180 tablet 0  . mometasone (ELOCON) 0.1 % cream Apply 1 application topically 2 (two) times daily. 45 g 0  . Naftifine HCl 1 % GEL Twice daily  for 4 weeks 180 g 1  . omeprazole (PRILOSEC) 20 MG capsule TAKE ONE CAPSULE BY MOUTH TWICE DAILY AS NEEDED FOR ACID REFLUX 180 capsule 0  . pravastatin (PRAVACHOL) 40  MG tablet TAKE ONE TABLET BY MOUTH ONCE DAILY WITH BREAKFAST 90 tablet 0  . valACYclovir (VALTREX) 1000 MG tablet Take 1 tablet (1,000 mg total) by mouth 2 (two) times daily. 20 tablet 0   No current facility-administered medications for this visit.     Allergies:   Sudafed [pseudoephedrine hcl]; Avelox [moxifloxacin hcl in nacl]; and Prednisone   Social History: Social History   Social History  . Marital status: Divorced    Spouse name: N/A  . Number of children: N/A  . Years of education: N/A   Occupational History  . Not on file.   Social History Main Topics  . Smoking status: Light Tobacco Smoker     Packs/day: 0.25    Years: 45.00    Types: Cigarettes  . Smokeless tobacco: Former Systems developer    Types: [place] date: 06/20/1989     Comment: he is not interested in cessation  . Alcohol use 3.6 oz/week    6 Cans of beer per week     Comment: drings 4-6 beers per day  . Drug use: No     Comment: stopped using marijuana- last used 12/04/2015  . Sexual activity: No   Other Topics Concern  . Not on file   Social History Narrative  . No narrative on file    Family History: Family History  Problem Relation Age of Onset  . Colon polyps Sister     Review of Systems: All other systems reviewed and are otherwise negative except as noted above.   Physical Exam: VS:  BP 140/66   Pulse (!) 55   Ht 5\' 8"  (1.727 m)   Wt 133 lb (60.3 kg)   SpO2 98%   BMI 20.22 kg/m  , BMI Body mass index is 20.22 kg/m.  GEN- The patient is elderly appearing, alert and oriented x 3 today.   HEENT: normocephalic, atraumatic; sclera clear, conjunctiva pink; hearing intact; oropharynx clear; neck supple  Lungs- Clear to ausculation bilaterally, normal work of breathing.  No wheezes, rales, rhonchi Heart- Regular rate and rhythm, no murmurs, rubs or gallops  GI- soft, non-tender, non-distended, bowel sounds present  Extremities- no clubbing, cyanosis, or edema  MS- no significant deformity or atrophy Skin- warm and dry, no rash or lesion; ICD pocket well healed Psych- euthymic mood, full affect Neuro- strength and sensation are intact  ICD interrogation- reviewed in detail today,  See PACEART report  EKG:  EKG is ordered today. The ekg ordered today shows sinus rhythm with V pacing   Recent Labs: 12/08/2015: Hemoglobin 12.2; Platelets 362 03/15/2016: ALT 27; BUN 11; Creatinine, Ser 0.99; Potassium 5.3; Sodium 129   Wt Readings from Last 3 Encounters:  10/18/16 133 lb (60.3 kg)  07/27/16 136 lb (61.7 kg)  06/30/16 136 lb 9.6 oz (62 kg)     Other studies Reviewed: Additional studies/ records  that were reviewed today include:Dr Allred and Dr Hochrein's office notes  Assessment and Plan:  1.  Chronic systolic dysfunction euvolemic today Stable on an appropriate medical regimen Normal ICD function See Pace Art report No changes today EF normalized post CRT BMET today   2.  Hypertensive cardiovascular disease Stable No change required today  Current medicines are reviewed at length with the patient today.   The patient does not have concerns regarding his medicines.  The following changes were made today:  none  Labs/ tests ordered today include: BMET Orders Placed This Encounter  Procedures  . Basic  Metabolic Panel (BMET)  . CUP PACEART [place]  . EKG 12-Lead     Disposition:   Follow up with [facility], Dr Rayann Heman 1 year     Signed, Chanetta Marshall, NP 10/18/2016 8:50 AM  [facility] [address] [place] [address] [phone] (office) [phone] (fax)

## 2016-10-18 ENCOUNTER — Encounter: Payer: Self-pay | Admitting: Nurse Practitioner

## 2016-10-18 ENCOUNTER — Ambulatory Visit (INDEPENDENT_AMBULATORY_CARE_PROVIDER_SITE_OTHER): Payer: Medicare Other | Admitting: Nurse Practitioner

## 2016-10-18 VITALS — BP 140/66 | HR 55 | Ht 68.0 in | Wt 133.0 lb

## 2016-10-18 DIAGNOSIS — I119 Hypertensive heart disease without heart failure: Secondary | ICD-10-CM | POA: Diagnosis not present

## 2016-10-18 DIAGNOSIS — I428 Other cardiomyopathies: Secondary | ICD-10-CM

## 2016-10-18 LAB — CUP PACEART INCLINIC DEVICE CHECK
Implantable Lead Implant Date: 20040312
Implantable Lead Implant Date: 20040312
Implantable Lead Implant Date: 20040312
Implantable Lead Location: 753858
Implantable Lead Location: 753859
Implantable Lead Location: 753860
Implantable Lead Model: 4087
Implantable Lead Serial Number: 129158
Implantable Pulse Generator Implant Date: 20150917
MDC IDC LEAD SERIAL: 209446
MDC IDC LEAD SERIAL: 407264
MDC IDC PG SERIAL: 106298
MDC IDC SESS DTM: 20180501083926

## 2016-10-18 LAB — BASIC METABOLIC PANEL
BUN / CREAT RATIO: 10 (ref 10–24)
BUN: 11 mg/dL (ref 8–27)
CHLORIDE: 85 mmol/L — AB (ref 96–106)
CO2: 22 mmol/L (ref 18–29)
Calcium: 9.8 mg/dL (ref 8.6–10.2)
Creatinine, Ser: 1.14 mg/dL (ref 0.76–1.27)
GFR, EST AFRICAN AMERICAN: 73 mL/min/{1.73_m2} (ref >59)
GFR, EST NON AFRICAN AMERICAN: 63 mL/min/{1.73_m2} (ref >59)
Glucose: 87 mg/dL (ref 65–99)
POTASSIUM: 4.9 mmol/L (ref 3.5–5.2)
Sodium: 125 mmol/L — ABNORMAL LOW (ref 134–144)

## 2016-10-18 NOTE — Patient Instructions (Signed)
Medication Instructions:  None Ordered   Labwork: Your physician recommends that you return for lab work today for BMET  Testing/Procedures: Remote monitoring is used to monitor your Pacemaker from home. This monitoring reduces the number of office visits required to check your device to one time per year. It allows [place] to keep an eye on the functioning of your device to ensure it is working properly. You are scheduled for a device check from home on 01/17/2017. You may send your transmission at any time that day. If you have a wireless device, the transmission will be sent automatically. After your physician reviews your transmission, you will receive a postcard with your next transmission date.    Follow-Up: Your physician wants you to follow-up in: 1 year with Dr. Rayann Heman.  You will receive a reminder letter in the mail two months in advance. If you don't receive a letter, please call our office to schedule the follow-up appointment.   Any Other Special Instructions Will Be Listed Below (If Applicable).     If you need a refill on your cardiac medications before your next appointment, please call your pharmacy.

## 2016-10-21 ENCOUNTER — Encounter: Payer: Self-pay | Admitting: Nurse Practitioner

## 2016-10-21 ENCOUNTER — Ambulatory Visit (INDEPENDENT_AMBULATORY_CARE_PROVIDER_SITE_OTHER): Payer: Medicare Other | Admitting: Family

## 2016-10-21 ENCOUNTER — Encounter: Payer: Self-pay | Admitting: Family

## 2016-10-21 VITALS — BP 137/81 | HR 61 | Temp 97.2°F | Ht 68.0 in | Wt 130.0 lb

## 2016-10-21 DIAGNOSIS — M79641 Pain in right hand: Secondary | ICD-10-CM | POA: Diagnosis not present

## 2016-10-21 DIAGNOSIS — M7989 Other specified soft tissue disorders: Secondary | ICD-10-CM | POA: Diagnosis not present

## 2016-10-21 NOTE — Patient Instructions (Signed)
Hand Pain Many things can cause hand pain. Some common causes are:  An injury.  Repeating the same movement with your hand over and over (overuse).  Osteoporosis.  Arthritis.  Lumps in the tendons or joints of the hand and wrist (ganglion cysts).  Infection. Follow these instructions at home: Pay attention to any changes in your symptoms. Take these actions to help with your discomfort:  If directed, put ice on the affected area:  Put ice in a plastic bag.  Place a towel between your skin and the bag.  Leave the ice on for 15-20 minutes, 3?4 times a day for 2 days.  Take over-the-counter and prescription medicines only as told by your health care provider.  Minimize stress on your hands and wrists as much as possible.  Take breaks from repetitive activity often.  Do stretches as told by your health care provider.  Do not do activities that make your pain worse. Contact a health care provider if:  Your pain does not get better after a few days of self-care.  Your pain gets worse.  Your pain affects your ability to do your daily activities. Get help right away if:  Your hand becomes warm, red, or swollen.  Your hand is numb or tingling.  Your hand is extremely swollen or deformed.  Your hand or fingers turn white or blue.  You cannot move your hand, wrist, or fingers. This information is not intended to replace advice given to you by your health care provider. Make sure you discuss any questions you have with your health care provider. Document Released: 07/03/2015 Document Revised: 11/12/2015 Document Reviewed: 07/02/2014 Elsevier Interactive Patient Education  2017 [employer].

## 2016-10-21 NOTE — Progress Notes (Signed)
   Subjective:    Patient ID: [name], male    DOB: Mar 01, 1941, 76 y.o.   MRN: [id number]   HPI Pt presents to the office today with left hand swelling and pain that started 2-3 days ago that has become worse. He reports constant pain 6-7 out 10. Pt has not taken anything for the pain. Warm water and movement make the pain worse. Denies any trauma or injury and states his palms itch. Pt had a BMP drawn on 10/18/16 that was stable.     Review of Systems  Musculoskeletal: Positive for arthralgias.  All other systems reviewed and are negative.      Objective:   Physical Exam  Constitutional: He is oriented to person, place, and time. He appears well-developed and well-nourished. No distress.  HENT:  Head: Normocephalic.  Eyes: Pupils are equal, round, and reactive to light. Right eye exhibits no discharge. Left eye exhibits no discharge.  Neck: Normal range of motion. Neck supple. No thyromegaly present.  Cardiovascular: Normal rate, regular rhythm, normal heart sounds and intact distal pulses.   No murmur heard. Pulmonary/Chest: Effort normal and breath sounds normal. No respiratory distress. He has no wheezes.  Abdominal: Soft. Bowel sounds are normal. He exhibits no distension. There is no tenderness.  Musculoskeletal: Normal range of motion. He exhibits edema (trace in left hand). He exhibits no tenderness.  Neurological: He is alert and oriented to person, place, and time.  Skin: Skin is warm and dry. No rash noted. No erythema.  Psychiatric: He has a normal mood and affect. His behavior is normal. Judgment and thought content normal.  Vitals reviewed.   BP 137/81   Pulse 61   Temp 97.2 F (36.2 C) (Oral)   Ht 5\' 8"  (1.727 m)   Wt 130 lb (59 kg)   BMI 19.77 kg/m        Assessment & Plan:  1. Swelling of right hand  2. Pain of right hand   Take tylenol prn  Keep elevated I am not sure if this is some type of dermitis/reaction to something he has  touched? Pt complaining of itching and swelling, Pt to take benadryl  Avoid chemicals or irritants RTO prn   Evelina Dun, FNP

## 2016-10-22 ENCOUNTER — Other Ambulatory Visit: Payer: Self-pay | Admitting: Family

## 2016-10-22 DIAGNOSIS — I10 Essential (primary) hypertension: Secondary | ICD-10-CM

## 2016-10-25 DIAGNOSIS — L989 Disorder of the skin and subcutaneous tissue, unspecified: Secondary | ICD-10-CM | POA: Diagnosis not present

## 2016-10-25 DIAGNOSIS — B353 Tinea pedis: Secondary | ICD-10-CM | POA: Diagnosis not present

## 2016-11-09 ENCOUNTER — Encounter: Payer: Self-pay | Admitting: Family Medicine

## 2016-11-09 ENCOUNTER — Ambulatory Visit (INDEPENDENT_AMBULATORY_CARE_PROVIDER_SITE_OTHER): Payer: Medicare Other | Admitting: Family Medicine

## 2016-11-09 VITALS — BP 135/76 | HR 57 | Temp 97.0°F | Ht 68.0 in | Wt 132.0 lb

## 2016-11-09 DIAGNOSIS — L309 Dermatitis, unspecified: Secondary | ICD-10-CM

## 2016-11-09 MED ORDER — BETAMETHASONE SOD PHOS & ACET 6 (3-3) MG/ML IJ SUSP
6.0000 mg | Freq: Once | INTRAMUSCULAR | Status: AC
  Administered 2016-11-09: 6 mg via INTRAMUSCULAR

## 2016-11-09 MED ORDER — PREDNISONE 10 MG PO TABS: ORAL_TABLET | ORAL | 0 refills | Status: DC

## 2016-11-09 NOTE — Patient Instructions (Signed)
Take the pills as directed starting with 5 a day for 2 days and cutting by 1 pill every other day until finished.  Cetaphil is a lotion you should use regularly after your shower and at least once more through the day. Significant all over your skin due to excessive dryness. However, do not use it within one hour of applying the medicated creams as it will dilute them.  It was a pleasure to see you today.  Best regards, Claretta Fraise M.D.

## 2016-11-09 NOTE — Progress Notes (Signed)
Subjective:  Patient ID: [name], male    DOB: 11-10-1940  Age: 76 y.o. MRN: [id number]  CC: Rash (pt here today c/o rash on both arms that is very itchy and he also has one spot on his chest )   HPI [name] presents for Highly pruritic rash lasting a month. This is a new problem for this provider. He has been seen by others in the past. Primarily located on the arms and legs. The patient brings in some mometasone and triamcinolone that he's been using on it. He has some listed medications including clobetasol below that he has not been using. The patient has not been able to avoid scratching it just itches so badly. No known exposures. His daughter has a history of psoriasis. No associated symptoms such as fever chills sweats, or other signs of infection including cough runny nose. No respiratory distress or chest pain.  History [name] has a past medical history of Arthritis; CAD (coronary artery disease); Carotid artery disease ([place]); COPD (chronic obstructive pulmonary disease) ([place]); Depression; Dyslipidemia; EtOH dependence ([place]); Hyperlipidemia; Hypertension; Insomnia; Nonischemic cardiomyopathy ([place]); Pacemaker; Peptic ulcer disease; and Tobacco abuse.   He has a past surgical history that includes Ulcer surgery; biventricular defibrillator implantation; Coronary stent placement; Biv icd genertaor change out (N/A, 03/06/2014); Cataract extraction w/PHACO (Right, 12/10/2015); and Cataract extraction w/PHACO (Left, 01/14/2016).   His family history includes Colon polyps in his sister.He reports that he has been smoking Cigarettes.  He has a 11.25 pack-year smoking history. He quit smokeless tobacco use about 27 years ago. His smokeless tobacco use included Chew. He reports that he drinks about 3.6 oz of alcohol per week . He reports that he does not use drugs.  Current Outpatient Prescriptions on File Prior to Visit  Medication Sig Dispense Refill  . ALPRAZolam (XANAX) 1 MG  tablet Take 1 tablet (1 mg total) by mouth 2 (two) times daily as needed. 60 tablet 4  . amLODipine (NORVASC) 10 MG tablet TAKE ONE TABLET BY MOUTH ONCE DAILY 90 tablet 0  . aspirin 81 MG tablet Take 81 mg by mouth daily.    . cetirizine (ZYRTEC) 10 MG tablet Take 10 mg by mouth daily as needed for allergies.    Marland Kitchen lisinopril (PRINIVIL,ZESTRIL) 40 MG tablet TAKE ONE TABLET BY MOUTH ONCE DAILY 90 tablet 1  . metoprolol (LOPRESSOR) 100 MG tablet TAKE ONE TABLET BY MOUTH TWICE DAILY FOR HIGH BLOOD PRESSURE CONTROL 180 tablet 0  . mometasone (ELOCON) 0.1 % cream Apply 1 application topically 2 (two) times daily. 45 g 0  . omeprazole (PRILOSEC) 20 MG capsule TAKE ONE CAPSULE BY MOUTH TWICE DAILY AS NEEDED FOR ACID REFLUX 180 capsule 0  . pravastatin (PRAVACHOL) 40 MG tablet TAKE ONE TABLET BY MOUTH ONCE DAILY WITH BREAKFAST 90 tablet 0  . valACYclovir (VALTREX) 1000 MG tablet Take 1 tablet (1,000 mg total) by mouth 2 (two) times daily. 20 tablet 0   No current facility-administered medications on file prior to visit.     ROS Review of Systems  Constitutional: Negative for chills, diaphoresis and fever.  HENT: Negative.   Eyes: Negative.   Respiratory: Negative for cough and shortness of breath.   Cardiovascular: Negative for chest pain.  Musculoskeletal: Negative for arthralgias and joint swelling.  Skin: Positive for rash.       See history of present illness  Psychiatric/Behavioral: Negative for agitation, confusion and dysphoric mood.    Objective:  BP 135/76  Pulse (!) 57   Temp 97 F (36.1 C) (Oral)   Ht 5\' 8"  (1.727 m)   Wt 132 lb (59.9 kg)   BMI 20.07 kg/m   Physical Exam  Constitutional: He is oriented to person, place, and time. He appears well-developed and well-nourished. No distress.  HENT:  Head: Normocephalic and atraumatic.  Eyes: Conjunctivae and EOM are normal. Pupils are equal, round, and reactive to light.  Neck: Neck supple.  Pulmonary/Chest: Effort normal.  He has no wheezes.  Abdominal: Soft.  Musculoskeletal: Normal range of motion. He exhibits no edema.  Neurological: He is alert and oriented to person, place, and time. No cranial nerve deficit. Coordination normal.  Skin: Skin is warm and dry. Rash noted. There is erythema.  There is marked erythema covering the dorsal forearms and the shins of both legs. There is a 3 x 5 cm erythematous region at the left of midline of the chest. There is a fairly thick silvery scale on the chest lesion. The legs have marked excoriations with crusts where he strong blood from the scratching. The arms are lichenified with erythema. The skin overall is very dry    Assessment & Plan:   [name] was seen today for rash.  Diagnoses and all orders for this visit:  Eczema, unspecified type -     betamethasone acetate-betamethasone sodium phosphate (CELESTONE) injection 6 mg; Inject 1 mL (6 mg total) into the muscle once.  Other orders -     predniSONE (DELTASONE) 10 MG tablet; Take 5 daily for 2 days followed by 4,3,2 and 1 for 2 days each.   I have discontinued [name] Clobetasol Prop Emollient Base, Efinaconazole, and Naftifine HCl. I am also having him start on predniSONE. Additionally, I am having him maintain his aspirin, cetirizine, omeprazole, mometasone, valACYclovir, ALPRAZolam, metoprolol tartrate, amLODipine, pravastatin, and lisinopril. We will continue to administer betamethasone acetate-betamethasone sodium phosphate.  Meds ordered this encounter  Medications  . betamethasone acetate-betamethasone sodium phosphate (CELESTONE) injection 6 mg  . predniSONE (DELTASONE) 10 MG tablet    Sig: Take 5 daily for 2 days followed by 4,3,2 and 1 for 2 days each.    Dispense:  30 tablet    Refill:  0    Continue your mometasone on all affected areas.  Follow-up: Return if symptoms worsen or fail to improve.  Claretta Fraise, M.D.

## 2016-12-08 ENCOUNTER — Other Ambulatory Visit: Payer: Self-pay | Admitting: Family

## 2016-12-08 DIAGNOSIS — F411 Generalized anxiety disorder: Secondary | ICD-10-CM

## 2016-12-08 NOTE — Telephone Encounter (Signed)
Last filled 11/10/16, last seen for GAd 07/27/16. Route to pool

## 2016-12-08 NOTE — Telephone Encounter (Signed)
Refill called to [facility] 

## 2016-12-15 ENCOUNTER — Other Ambulatory Visit: Payer: Self-pay | Admitting: Family

## 2016-12-16 ENCOUNTER — Other Ambulatory Visit: Payer: Self-pay | Admitting: Family

## 2016-12-23 ENCOUNTER — Encounter: Payer: Self-pay | Admitting: Family

## 2016-12-23 ENCOUNTER — Ambulatory Visit (INDEPENDENT_AMBULATORY_CARE_PROVIDER_SITE_OTHER): Payer: Medicare Other | Admitting: Family

## 2016-12-23 VITALS — BP 131/73 | HR 61 | Temp 97.0°F | Ht 68.0 in | Wt 133.4 lb

## 2016-12-23 DIAGNOSIS — L409 Psoriasis, unspecified: Secondary | ICD-10-CM

## 2016-12-23 DIAGNOSIS — B352 Tinea manuum: Secondary | ICD-10-CM

## 2016-12-23 DIAGNOSIS — B353 Tinea pedis: Secondary | ICD-10-CM

## 2016-12-23 DIAGNOSIS — L089 Local infection of the skin and subcutaneous tissue, unspecified: Secondary | ICD-10-CM | POA: Diagnosis not present

## 2016-12-23 MED ORDER — CALCIPOTRIENE 0.005 % EX CREA: TOPICAL_CREAM | Freq: Two times a day (BID) | CUTANEOUS | 0 refills | Status: DC

## 2016-12-23 MED ORDER — KETOCONAZOLE 2 % EX CREA: 1.0000 "application " | TOPICAL_CREAM | Freq: Every day | CUTANEOUS | 0 refills | Status: DC

## 2016-12-23 MED ORDER — TERBINAFINE HCL 250 MG PO TABS: 250.0000 mg | ORAL_TABLET | Freq: Every day | ORAL | 1 refills | Status: DC

## 2016-12-23 MED ORDER — CEPHALEXIN 500 MG PO CAPS: 500.0000 mg | ORAL_CAPSULE | Freq: Four times a day (QID) | ORAL | 0 refills | Status: AC

## 2016-12-23 NOTE — Progress Notes (Signed)
   Subjective:    Patient ID: [name], male    DOB: 12/16/1940, 76 y.o.   MRN: [id number]  HPI PT presents to the office today with recurrent rash. Pt has a rash on bilateral hands and feet that is itches and his skin is peeling that started months ago that has become worse. PT has tried antifungal cream with mild relief.   Pt has erythemas scaly rash on bilateral lower legs, back, arms and chest. PT has psoriasis. He has been referred to dermatologists in the past, but has never followed up.   He reports the area on his chest about three weeks ago. He believes he may have been bite by something, but the area has become larger and more erythemas. Pt states the area is tender and painful and has clear discharge.    Review of Systems  Skin: Positive for rash.  All other systems reviewed and are negative.      Objective:   Physical Exam  Constitutional: He is oriented to person, place, and time. He appears well-developed and well-nourished. No distress.  HENT:  Head: Normocephalic.  Cardiovascular: Normal rate, regular rhythm, normal heart sounds and intact distal pulses.   No murmur heard. Pulmonary/Chest: Effort normal and breath sounds normal. No respiratory distress. He has no wheezes.  Abdominal: Soft. Bowel sounds are normal. He exhibits no distension. There is no tenderness.  Musculoskeletal: Normal range of motion. He exhibits no edema or tenderness.  Neurological: He is alert and oriented to person, place, and time.  Skin: Skin is warm and dry. Rash noted. There is erythema.  Erythemas fungal rash on bilateral hands and feet, skin peeling  Circular Erythemas scaly rash on lower legs, knees, arms, back, and chest  Psychiatric: He has a normal mood and affect. His behavior is normal. Judgment and thought content normal.  Vitals reviewed.     BP 131/73   Pulse 61   Temp (!) 97 F (36.1 C) (Oral)   Ht _0  (1.727 m)   Wt 133 lb 6.4 oz (60.5 kg)   BMI 20.28  kg/m      Assessment & Plan:  1. Psoriasis - calcipotriene (DOVONOX) 0.005 % cream; Apply topically 2 (two) times daily.  Dispense: 60 g; Refill: 0 - CMP14+EGFR - Ambulatory referral to Dermatology  2. Tinea pedis of both feet - ketoconazole (NIZORAL) 2 % cream; Apply 1 application topically daily.  Dispense: 15 g; Refill: 0 - CMP14+EGFR - Ambulatory referral to Dermatology  3. Tinea manuum - ketoconazole (NIZORAL) 2 % cream; Apply 1 application topically daily.  Dispense: 15 g; Refill: 0 - CMP14+EGFR - Ambulatory referral to Dermatology  4. Skin infection -Do not scratch Report any s/s of increase erythemas, discharge, or fever - cephALEXin (KEFLEX) 500 MG capsule; Take 1 capsule (500 mg total) by mouth 4 (four) times daily.  Dispense: 40 capsule; Refill: 0   PT needs to follow up with Derm!!! Do not scratch Good hand hygiene RTO prn   Evelina Dun, FNP

## 2016-12-23 NOTE — Patient Instructions (Signed)
Athlete's Foot Athlete's foot (tinea pedis) is a fungal infection of the skin on the feet. It often occurs on the skin that is between or underneath the toes. It can also occur on the soles of the feet. The infection can spread from person to person (is contagious). What are the causes? Athlete's foot is caused by a fungus. This fungus grows in warm, moist places. Most people get athlete's foot by sharing shower stalls, towels, and wet floors with someone who is infected. Not washing your feet or changing your socks often enough can contribute to athlete's foot. What increases the risk? This condition is more likely to develop in:  Men.  People who have a weak body defense system (immune system).  People who have diabetes.  People who use public showers, such as at a gym.  People who wear heavy-duty shoes, such as industrial or military shoes.  Seasons with warm, humid weather.  What are the signs or symptoms? Symptoms of this condition include:  Itchy areas between the toes or on the soles of the feet.  White, flaky, or scaly areas between the toes or on the soles of the feet.  Very itchy small blisters between the toes or on the soles of the feet.  Small cuts on the skin. These cuts can become infected.  Thick or discolored toenails.  How is this diagnosed? This condition is diagnosed with a medical history and physical exam. Your health care provider may also take a skin or toenail sample to be examined. How is this treated? Treatment for this condition includes antifungal medicines. These may be applied as powders, ointments, or creams. In severe cases, an oral antifungal medicine may be given. Follow these instructions at home:  Apply or take over-the-counter and prescription medicines only as told by your health care provider.  Keep all follow-up visits as told by your health care provider. This is important.  Do not scratch your feet.  Keep your feet dry: ? Wear  cotton or wool socks. Change your socks every day or if they become wet. ? Wear shoes that allow air to circulate, such as sandals or canvas tennis shoes.  Wash and dry your feet: ? Every day or as told by your health care provider. ? After exercising. ? Including the area between your toes.  Do not share towels, nail clippers, or other personal items that touch your feet with others.  If you have diabetes, keep your blood sugar under control. How is this prevented?  Do not share towels.  Wear sandals in wet areas, such as locker rooms and shared showers.  Keep your feet dry: ? Wear cotton or wool socks. Change your socks every day or if they become wet. ? Wear shoes that allow air to circulate, such as sandals or canvas tennis shoes.  Wash and dry your feet after exercising. Pay attention to the area between your toes. Contact a health care provider if:  You have a fever.  You have swelling, soreness, warmth, or redness in your foot.  You are not getting better with treatment.  Your symptoms get worse.  You have new symptoms. This information is not intended to replace advice given to you by your health care provider. Make sure you discuss any questions you have with your health care provider. Document Released: 06/03/2000 Document Revised: 11/12/2015 Document Reviewed: 12/08/2014 Elsevier Interactive Patient Education  2018 [employer]  

## 2016-12-24 LAB — CMP14+EGFR
A/G RATIO: 1.7 (ref 1.2–2.2)
ALBUMIN: 4.7 g/dL (ref 3.5–4.8)
ALT: 16 IU/L (ref 0–44)
AST: 29 IU/L (ref 0–40)
Alkaline Phosphatase: 77 IU/L (ref 39–117)
BILIRUBIN TOTAL: 0.4 mg/dL (ref 0.0–1.2)
BUN / CREAT RATIO: 14 (ref 10–24)
BUN: 18 mg/dL (ref 8–27)
CALCIUM: 9.9 mg/dL (ref 8.6–10.2)
CHLORIDE: 90 mmol/L — AB (ref 96–106)
CO2: 22 mmol/L (ref 20–29)
Creatinine, Ser: 1.31 mg/dL — ABNORMAL HIGH (ref 0.76–1.27)
GFR, EST AFRICAN AMERICAN: 62 mL/min/{1.73_m2} (ref >59)
GFR, EST NON AFRICAN AMERICAN: 54 mL/min/{1.73_m2} — AB (ref >59)
Globulin, Total: 2.7 g/dL (ref 1.5–4.5)
Glucose: 81 mg/dL (ref 65–99)
POTASSIUM: 4.7 mmol/L (ref 3.5–5.2)
Sodium: 129 mmol/L — ABNORMAL LOW (ref 134–144)
TOTAL PROTEIN: 7.4 g/dL (ref 6.0–8.5)

## 2017-01-02 DIAGNOSIS — L409 Psoriasis, unspecified: Secondary | ICD-10-CM | POA: Diagnosis not present

## 2017-01-02 DIAGNOSIS — B359 Dermatophytosis, unspecified: Secondary | ICD-10-CM | POA: Diagnosis not present

## 2017-01-09 ENCOUNTER — Telehealth: Payer: Self-pay | Admitting: Family

## 2017-01-09 NOTE — Telephone Encounter (Signed)
Faxed through epic

## 2017-01-17 ENCOUNTER — Other Ambulatory Visit: Payer: Self-pay | Admitting: Family

## 2017-01-17 ENCOUNTER — Ambulatory Visit (INDEPENDENT_AMBULATORY_CARE_PROVIDER_SITE_OTHER): Payer: Medicare Other | Admitting: *Deleted

## 2017-01-17 DIAGNOSIS — I428 Other cardiomyopathies: Secondary | ICD-10-CM | POA: Diagnosis not present

## 2017-01-17 NOTE — Progress Notes (Signed)
Remote ICD transmission.   

## 2017-01-20 ENCOUNTER — Encounter: Payer: Self-pay | Admitting: Cardiology

## 2017-02-13 ENCOUNTER — Encounter: Payer: Self-pay | Admitting: Cardiology

## 2017-02-21 LAB — CUP PACEART REMOTE DEVICE CHECK
Battery Remaining Longevity: 84 mo
Battery Remaining Percentage: 100 %
Brady Statistic RA Percent Paced: 0 %
Brady Statistic RV Percent Paced: 100 %
Date Time Interrogation Session: 20180731043100
HIGH POWER IMPEDANCE MEASURED VALUE: 65 Ohm
Implantable Lead Location: 753859
Implantable Lead Model: 158
Implantable Lead Model: 4087
Implantable Lead Model: 4513
Implantable Lead Serial Number: 129158
Implantable Lead Serial Number: 209446
Implantable Pulse Generator Implant Date: 20150917
Lead Channel Pacing Threshold Amplitude: 0.6 V
Lead Channel Pacing Threshold Amplitude: 0.9 V
Lead Channel Pacing Threshold Amplitude: 1.8 V
Lead Channel Pacing Threshold Pulse Width: 0.4 ms
Lead Channel Pacing Threshold Pulse Width: 0.8 ms
Lead Channel Setting Pacing Amplitude: 2 V
Lead Channel Setting Pacing Amplitude: 2.5 V
Lead Channel Setting Sensing Sensitivity: 0.6 mV
Lead Channel Setting Sensing Sensitivity: 1 mV
MDC IDC LEAD IMPLANT DT: 20040312
MDC IDC LEAD IMPLANT DT: 20040312
MDC IDC LEAD IMPLANT DT: 20040312
MDC IDC LEAD LOCATION: 753858
MDC IDC LEAD LOCATION: 753860
MDC IDC LEAD SERIAL: 407264
MDC IDC MSMT LEADCHNL LV IMPEDANCE VALUE: 836 Ohm
MDC IDC MSMT LEADCHNL RA IMPEDANCE VALUE: 598 Ohm
MDC IDC MSMT LEADCHNL RV IMPEDANCE VALUE: 645 Ohm
MDC IDC MSMT LEADCHNL RV PACING THRESHOLD PULSEWIDTH: 0.4 ms
MDC IDC SET LEADCHNL LV PACING AMPLITUDE: 2.8 V
MDC IDC SET LEADCHNL LV PACING PULSEWIDTH: 0.8 ms
MDC IDC SET LEADCHNL RV PACING PULSEWIDTH: 0.4 ms
Pulse Gen Serial Number: 106298

## 2017-02-25 ENCOUNTER — Other Ambulatory Visit: Payer: Self-pay | Admitting: Family

## 2017-02-28 ENCOUNTER — Other Ambulatory Visit: Payer: Self-pay | Admitting: Family

## 2017-03-08 DIAGNOSIS — H35372 Puckering of macula, left eye: Secondary | ICD-10-CM | POA: Diagnosis not present

## 2017-03-08 DIAGNOSIS — Z961 Presence of intraocular lens: Secondary | ICD-10-CM | POA: Diagnosis not present

## 2017-03-08 DIAGNOSIS — H26493 Other secondary cataract, bilateral: Secondary | ICD-10-CM | POA: Diagnosis not present

## 2017-03-08 DIAGNOSIS — H35341 Macular cyst, hole, or pseudohole, right eye: Secondary | ICD-10-CM | POA: Diagnosis not present

## 2017-03-09 ENCOUNTER — Other Ambulatory Visit: Payer: Self-pay | Admitting: Family

## 2017-03-09 DIAGNOSIS — F411 Generalized anxiety disorder: Secondary | ICD-10-CM

## 2017-03-09 NOTE — Telephone Encounter (Signed)
PT needs appt for chronic follow up (anxiety). Has been seen multiple times but has been acute issues. Please call medication in, but let him know he needs appt

## 2017-03-09 NOTE — Telephone Encounter (Signed)
Phoned in.

## 2017-03-09 NOTE — Telephone Encounter (Signed)
Last filled 02/08/2017

## 2017-03-20 ENCOUNTER — Other Ambulatory Visit: Payer: Self-pay | Admitting: Dermatology

## 2017-03-20 DIAGNOSIS — D492 Neoplasm of unspecified behavior of bone, soft tissue, and skin: Secondary | ICD-10-CM | POA: Diagnosis not present

## 2017-03-20 DIAGNOSIS — L308 Other specified dermatitis: Secondary | ICD-10-CM | POA: Diagnosis not present

## 2017-03-20 DIAGNOSIS — Z79899 Other long term (current) drug therapy: Secondary | ICD-10-CM | POA: Diagnosis not present

## 2017-03-20 DIAGNOSIS — L409 Psoriasis, unspecified: Secondary | ICD-10-CM | POA: Diagnosis not present

## 2017-03-20 DIAGNOSIS — L408 Other psoriasis: Secondary | ICD-10-CM | POA: Diagnosis not present

## 2017-04-11 ENCOUNTER — Other Ambulatory Visit: Payer: Self-pay | Admitting: Family

## 2017-04-17 ENCOUNTER — Other Ambulatory Visit: Payer: Self-pay | Admitting: Family

## 2017-04-18 ENCOUNTER — Ambulatory Visit (INDEPENDENT_AMBULATORY_CARE_PROVIDER_SITE_OTHER): Payer: Medicare Other | Admitting: *Deleted

## 2017-04-18 DIAGNOSIS — I428 Other cardiomyopathies: Secondary | ICD-10-CM | POA: Diagnosis not present

## 2017-04-18 NOTE — Progress Notes (Signed)
Remote ICD transmission.   

## 2017-04-18 NOTE — Telephone Encounter (Signed)
Last lipid 9/126/17

## 2017-04-21 ENCOUNTER — Other Ambulatory Visit: Payer: Self-pay | Admitting: Family

## 2017-04-21 LAB — CUP PACEART REMOTE DEVICE CHECK
Battery Remaining Percentage: 100 %
Brady Statistic RA Percent Paced: 0 %
Date Time Interrogation Session: 20181030043000
HighPow Impedance: 65 Ohm
Implantable Lead Location: 753860
Implantable Lead Model: 158
Implantable Lead Model: 4513
Implantable Lead Serial Number: 129158
Implantable Lead Serial Number: 209446
Implantable Lead Serial Number: 407264
Lead Channel Impedance Value: 621 Ohm
Lead Channel Impedance Value: 643 Ohm
Lead Channel Pacing Threshold Amplitude: 0.9 V
Lead Channel Pacing Threshold Pulse Width: 0.4 ms
Lead Channel Pacing Threshold Pulse Width: 0.8 ms
Lead Channel Setting Pacing Amplitude: 2 V
Lead Channel Setting Pacing Pulse Width: 0.4 ms
Lead Channel Setting Sensing Sensitivity: 1 mV
MDC IDC LEAD IMPLANT DT: 20040312
MDC IDC LEAD IMPLANT DT: 20040312
MDC IDC LEAD IMPLANT DT: 20040312
MDC IDC LEAD LOCATION: 753858
MDC IDC LEAD LOCATION: 753859
MDC IDC MSMT BATTERY REMAINING LONGEVITY: 84 mo
MDC IDC MSMT LEADCHNL LV IMPEDANCE VALUE: 869 Ohm
MDC IDC MSMT LEADCHNL LV PACING THRESHOLD AMPLITUDE: 1.8 V
MDC IDC MSMT LEADCHNL RV PACING THRESHOLD AMPLITUDE: 0.6 V
MDC IDC MSMT LEADCHNL RV PACING THRESHOLD PULSEWIDTH: 0.4 ms
MDC IDC PG IMPLANT DT: 20150917
MDC IDC SET LEADCHNL LV PACING AMPLITUDE: 2.8 V
MDC IDC SET LEADCHNL LV PACING PULSEWIDTH: 0.8 ms
MDC IDC SET LEADCHNL RV PACING AMPLITUDE: 2.5 V
MDC IDC SET LEADCHNL RV SENSING SENSITIVITY: 0.6 mV
MDC IDC STAT BRADY RV PERCENT PACED: 100 %
Pulse Gen Serial Number: 106298

## 2017-04-21 NOTE — Telephone Encounter (Signed)
Last lipid 02/2616

## 2017-04-23 ENCOUNTER — Other Ambulatory Visit: Payer: Self-pay | Admitting: Family

## 2017-04-23 DIAGNOSIS — I10 Essential (primary) hypertension: Secondary | ICD-10-CM

## 2017-04-24 NOTE — Telephone Encounter (Signed)
Patient aware of refills and scheduled for a follow up appointment.

## 2017-04-24 NOTE — Telephone Encounter (Signed)
Patient NTBS for follow up and lab work  

## 2017-04-26 ENCOUNTER — Encounter: Payer: Self-pay | Admitting: Cardiology

## 2017-04-28 ENCOUNTER — Other Ambulatory Visit: Payer: Self-pay | Admitting: *Deleted

## 2017-04-28 MED ORDER — PRAVASTATIN SODIUM 40 MG PO TABS: ORAL_TABLET | ORAL | 0 refills | Status: DC

## 2017-04-28 NOTE — Telephone Encounter (Signed)
Next OV 05/02/17

## 2017-05-02 ENCOUNTER — Encounter: Payer: Self-pay | Admitting: Family

## 2017-05-02 ENCOUNTER — Ambulatory Visit (INDEPENDENT_AMBULATORY_CARE_PROVIDER_SITE_OTHER): Payer: Medicare Other | Admitting: Family

## 2017-05-02 VITALS — BP 130/66 | HR 53 | Temp 97.1°F | Ht 68.0 in | Wt 133.8 lb

## 2017-05-02 DIAGNOSIS — I5022 Chronic systolic (congestive) heart failure: Secondary | ICD-10-CM | POA: Diagnosis not present

## 2017-05-02 DIAGNOSIS — F411 Generalized anxiety disorder: Secondary | ICD-10-CM | POA: Diagnosis not present

## 2017-05-02 DIAGNOSIS — E785 Hyperlipidemia, unspecified: Secondary | ICD-10-CM

## 2017-05-02 DIAGNOSIS — I428 Other cardiomyopathies: Secondary | ICD-10-CM

## 2017-05-02 DIAGNOSIS — I4729 Other ventricular tachycardia: Secondary | ICD-10-CM

## 2017-05-02 DIAGNOSIS — G4700 Insomnia, unspecified: Secondary | ICD-10-CM | POA: Diagnosis not present

## 2017-05-02 DIAGNOSIS — I472 Ventricular tachycardia: Secondary | ICD-10-CM | POA: Diagnosis not present

## 2017-05-02 DIAGNOSIS — K219 Gastro-esophageal reflux disease without esophagitis: Secondary | ICD-10-CM

## 2017-05-02 DIAGNOSIS — I700 Atherosclerosis of aorta: Secondary | ICD-10-CM | POA: Diagnosis not present

## 2017-05-02 DIAGNOSIS — L409 Psoriasis, unspecified: Secondary | ICD-10-CM | POA: Diagnosis not present

## 2017-05-02 DIAGNOSIS — I25119 Atherosclerotic heart disease of native coronary artery with unspecified angina pectoris: Secondary | ICD-10-CM | POA: Diagnosis not present

## 2017-05-02 DIAGNOSIS — F17200 Nicotine dependence, unspecified, uncomplicated: Secondary | ICD-10-CM

## 2017-05-02 DIAGNOSIS — I10 Essential (primary) hypertension: Secondary | ICD-10-CM | POA: Diagnosis not present

## 2017-05-02 MED ORDER — ALPRAZOLAM 1 MG PO TABS: 1.0000 mg | ORAL_TABLET | Freq: Two times a day (BID) | ORAL | 5 refills | Status: DC | PRN

## 2017-05-02 MED ORDER — PRAVASTATIN SODIUM 40 MG PO TABS: ORAL_TABLET | ORAL | 0 refills | Status: DC

## 2017-05-02 NOTE — Patient Instructions (Signed)
Psoriasis Psoriasis is a long-term (chronic) condition of skin inflammation. It occurs because your immune system causes skin cells to form too quickly. As a result, too many skin cells grow and create raised, red patches (plaques) that look silvery on your skin. Plaques may appear anywhere on your body. They can be any size or shape. Psoriasis can come and go. The condition varies from mild to very severe. It cannot be passed from one person to another (not contagious). What are the causes? The cause of psoriasis is not known, but certain factors can make the condition worse. These include:  Damage or trauma to the skin, such as cuts, scrapes, sunburn, and dryness.  Lack of sunlight.  Certain medicines.  Alcohol.  Tobacco use.  Stress.  Infections caused by bacteria or viruses.  What increases the risk? This condition is more likely to develop in:  People with a family history of psoriasis.  People who are Caucasian.  People who are between the ages of 15-30 and 50-60 years old.  What are the signs or symptoms? There are five different types of psoriasis. You can have more than one type of psoriasis during your life. Types are:  Plaque.  Guttate.  Inverse.  Pustular.  Erythrodermic.  Each type of psoriasis has different symptoms.  Plaque psoriasis symptoms include red, raised plaques with a silvery white coating (scale). These plaques may be itchy. Your nails may be pitted and crumbly or fall off.  Guttate psoriasis symptoms include small red spots that often show up on your trunk, arms, and legs. These spots may develop after you have been sick, especially with strep throat.  Inverse psoriasis symptoms include plaques in your underarm area, under your breasts, or on your genitals, groin, or buttocks.  Pustular psoriasis symptoms include pus-filled bumps that are painful, red, and swollen on the palms of your hands or the soles of your feet. You also may feel  exhausted, feverish, weak, or have no appetite.  Erythrodermic psoriasis symptoms include bright red skin that may look burned. You may have a fast heartbeat and a body temperature that is too high or too low. You may be itchy or in pain.  How is this diagnosed? Your health care provider may suspect psoriasis based on your symptoms and family history. Your health care provider will also do a physical exam. This may include a procedure to remove a tissue sample (biopsy) for testing. You may also be referred to a health care provider who specializes in skin diseases (dermatologist). How is this treated? There is no cure for this condition, but treatment can help manage it. Goals of treatment include:  Helping your skin heal.  Reducing itching and inflammation.  Slowing the growth of new skin cells.  Helping your immune system respond better to your skin.  Treatment varies, depending on the severity of your condition. Treatment may include:  Creams or ointments.  Ultraviolet ray exposure (light therapy). This may include natural sunlight or light therapy in a medical office.  Medicines (systemic therapy). These medicines can help your body better manage skin cell turnover and inflammation. They may be used along with light therapy or ointments. You may also get antibiotic medicines if you have an infection.  Follow these instructions at home: Skin Care  Moisturize your skin as needed. Only use moisturizers that have been approved by your health care provider.  Apply cool compresses to the affected areas.  Do not scratch your skin. Lifestyle   Do not   use tobacco products. This includes cigarettes, chewing tobacco, and e-cigarettes. If you need help quitting, ask your health care provider.  Drink little or no alcohol.  Try techniques for stress reduction, such as meditation or yoga.  Get exposure to the sun as told by your health care provider. Do not get sunburned.  Consider  joining a psoriasis support group. Medicines  Take or use over-the-counter and prescription medicines only as told by your health care provider.  If you were prescribed an antibiotic, take or use it as told by your health care provider. Do not stop taking the antibiotic even if your condition starts to improve. General instructions  Keep a journal to help track what triggers an outbreak. Try to avoid any triggers.  See a counselor or social worker if feelings of sadness, frustration, and hopelessness about your condition are interfering with your work and relationships.  Keep all follow-up visits as told by your health care provider. This is important. Contact a health care provider if:  Your pain gets worse.  You have increasing redness or warmth in the affected areas.  You have new or worsening pain or stiffness in your joints.  Your nails start to break easily or pull away from the nail bed.  You have a fever.  You feel depressed. This information is not intended to replace advice given to you by your health care provider. Make sure you discuss any questions you have with your health care provider. Document Released: 06/03/2000 Document Revised: 11/12/2015 Document Reviewed: 10/22/2014 Elsevier Interactive Patient Education  2018 Elsevier Inc.  

## 2017-05-02 NOTE — Progress Notes (Signed)
 Subjective:    Patient ID: [name], male    DOB: 10/30/1940, 76 y.o.   MRN: [id number]  Pt presents to the office today for chronic follow up. PT is followed by Cardiologists for CAD, CHF, Atherosclerosis, Cardiomyopathy, and Ventricular tachycardia every 2 years and his last visit was 11/11/15.  PT has pacemaker and is followed annually for this.    Pt is followed by Dermatologists for psoriasis and states this is improving since starting the medications.  Hypertension  This is a chronic problem. The current episode started more than 1 year ago. The problem has been resolved since onset. The problem is controlled. Associated symptoms include anxiety. Pertinent negatives include no malaise/fatigue, peripheral edema or shortness of breath. Risk factors for coronary artery disease include dyslipidemia, post-menopausal state and sedentary lifestyle. The current treatment provides moderate improvement. Hypertensive end-organ damage includes CAD/MI. There is no history of CVA or heart failure.  Hyperlipidemia  This is a chronic problem. The current episode started more than 1 year ago. The problem is controlled. Recent lipid tests were reviewed and are normal. Pertinent negatives include no shortness of breath. Current antihyperlipidemic treatment includes statins. The current treatment provides moderate improvement of lipids. Risk factors for coronary artery disease include dyslipidemia, male sex, a sedentary lifestyle and family history.  Depression         This is a chronic problem.  The current episode started more than 1 year ago.   The onset quality is gradual.   The problem occurs intermittently.  The problem has been waxing and waning since onset.  Associated symptoms include insomnia, irritable, decreased interest and sad.  Associated symptoms include no helplessness and no hopelessness.     The symptoms are aggravated by family issues.  Past medical history includes anxiety.   Anxiety    Presents for follow-up visit. Symptoms include excessive worry, insomnia and nervous/anxious behavior. Patient reports no depressed mood, irritability or shortness of breath. Symptoms occur occasionally. The severity of symptoms is mild.    Gastroesophageal Reflux  He complains of a hoarse voice. He reports no belching, no coughing or no heartburn. This is a chronic problem. The current episode started more than 1 year ago. The problem occurs rarely. The problem has been resolved. He has tried a PPI for the symptoms. The treatment provided moderate relief.  Insomnia  Primary symptoms: difficulty falling asleep, no malaise/fatigue.  The current episode started more than one year. The onset quality is gradual. The problem occurs intermittently. The problem has been waxing and waning since onset. The symptoms are aggravated by alcohol and anxiety. The treatment provided moderate relief. PMH includes: depression.      Review of Systems  Constitutional: Negative for irritability and malaise/fatigue.  HENT: Positive for hoarse voice.   Respiratory: Negative for cough and shortness of breath.   Gastrointestinal: Negative for heartburn.  Psychiatric/Behavioral: Positive for depression. The patient is nervous/anxious and has insomnia.   All other systems reviewed and are negative.      Objective:   Physical Exam  Constitutional: He is oriented to person, place, and time. He appears well-developed and well-nourished. He is irritable. No distress.  HENT:  Head: Normocephalic.  Right Ear: External ear normal.  Left Ear: External ear normal.  Nose: Nose normal.  Mouth/Throat: Oropharynx is clear and moist.  Eyes: Pupils are equal, round, and reactive to light. Right eye exhibits no discharge. Left eye exhibits no discharge.  Neck: Normal range of motion. Neck supple.   No thyromegaly present.  Cardiovascular: Normal rate, regular rhythm, normal heart sounds and intact distal pulses.  No murmur  heard. Pulmonary/Chest: Effort normal and breath sounds normal. No respiratory distress. He has no wheezes.  Abdominal: Soft. Bowel sounds are normal. He exhibits no distension. There is no tenderness.  Musculoskeletal: Normal range of motion. He exhibits no edema or tenderness.  Neurological: He is alert and oriented to person, place, and time.  Skin: Skin is warm and dry. Rash noted. There is erythema.  Erythemas plaque rash on elbows  Psychiatric: He has a normal mood and affect. His behavior is normal. Judgment and thought content normal.  Vitals reviewed.    BP 130/66   Pulse (!) 53   Temp (!) 97.1 F (36.2 C) (Oral)   Ht 5' 8" (1.727 m)   Wt 133 lb 12.8 oz (60.7 kg)   BMI 20.34 kg/m      Assessment & Plan:  1. Essential hypertension - CMP14+EGFR  2. Gastroesophageal reflux disease, esophagitis presence not specified - CMP14+EGFR  3. Hyperlipidemia, unspecified hyperlipidemia type - pravastatin (PRAVACHOL) 40 MG tablet; TAKE 1 TABLET BY MOUTH ONCE DAILY WITH BREAKFAST  Dispense: 90 tablet; Refill: 0 - CMP14+EGFR - Lipid panel  4. Insomnia, unspecified type - CMP14+EGFR  5. Atherosclerosis of native coronary artery with angina pectoris, unspecified whether native or transplanted heart (HCC) - CMP14+EGFR - Lipid panel  6. Chronic systolic heart failure (HCC) - CMP14+EGFR  7. Thoracic aorta atherosclerosis (HCC)  - CMP14+EGFR  8. TOBACCO ABUSE - CMP14+EGFR  9. GAD (generalized anxiety disorder) - CMP14+EGFR - ALPRAZolam (XANAX) 1 MG tablet; Take 1 tablet (1 mg total) 2 (two) times daily as needed by mouth.  Dispense: 60 tablet; Refill: 5  10. Nonischemic cardiomyopathy (HCC) - CMP14+EGFR  11. PAROXYSMAL VENTRICULAR TACHYCARDIA - CMP14+EGFR  12. Psoriasis   Continue all meds Labs pending Health Maintenance reviewed Diet and exercise encouraged RTO 6 months   Evelina Dun, FNP

## 2017-05-03 LAB — CMP14+EGFR
ALT: 14 IU/L (ref 0–44)
AST: 27 IU/L (ref 0–40)
Albumin/Globulin Ratio: 1.6 (ref 1.2–2.2)
Albumin: 4.6 g/dL (ref 3.5–4.8)
Alkaline Phosphatase: 83 IU/L (ref 39–117)
BUN/Creatinine Ratio: 12 (ref 10–24)
BUN: 14 mg/dL (ref 8–27)
Bilirubin Total: 0.3 mg/dL (ref 0.0–1.2)
CALCIUM: 9.6 mg/dL (ref 8.6–10.2)
CO2: 24 mmol/L (ref 20–29)
CREATININE: 1.19 mg/dL (ref 0.76–1.27)
Chloride: 90 mmol/L — ABNORMAL LOW (ref 96–106)
GFR calc Af Amer: 70 mL/min/{1.73_m2} (ref >59)
GFR, EST NON AFRICAN AMERICAN: 60 mL/min/{1.73_m2} (ref >59)
GLUCOSE: 108 mg/dL — AB (ref 65–99)
Globulin, Total: 2.8 g/dL (ref 1.5–4.5)
Potassium: 5.1 mmol/L (ref 3.5–5.2)
Sodium: 128 mmol/L — ABNORMAL LOW (ref 134–144)
Total Protein: 7.4 g/dL (ref 6.0–8.5)

## 2017-05-03 LAB — LIPID PANEL
CHOL/HDL RATIO: 2.5 ratio (ref 0.0–5.0)
Cholesterol, Total: 183 mg/dL (ref 100–199)
HDL: 72 mg/dL (ref >39)
LDL CALC: 94 mg/dL (ref 0–99)
TRIGLYCERIDES: 86 mg/dL (ref 0–149)
VLDL Cholesterol Cal: 17 mg/dL (ref 5–40)

## 2017-05-21 ENCOUNTER — Other Ambulatory Visit: Payer: Self-pay | Admitting: Family

## 2017-06-03 ENCOUNTER — Other Ambulatory Visit: Payer: Self-pay | Admitting: Family

## 2017-06-10 ENCOUNTER — Other Ambulatory Visit: Payer: Self-pay | Admitting: Family

## 2017-06-29 ENCOUNTER — Other Ambulatory Visit: Payer: Self-pay | Admitting: Family

## 2017-06-29 DIAGNOSIS — I10 Essential (primary) hypertension: Secondary | ICD-10-CM

## 2017-07-18 ENCOUNTER — Telehealth: Payer: Self-pay | Admitting: Cardiology

## 2017-07-18 ENCOUNTER — Encounter: Payer: Medicare Other | Admitting: *Deleted

## 2017-07-18 NOTE — Telephone Encounter (Signed)
Spoke with pt and reminded pt of remote transmission that is due today. Pt verbalized understanding.   

## 2017-07-21 ENCOUNTER — Encounter: Payer: Self-pay | Admitting: Cardiology

## 2017-08-03 DIAGNOSIS — R05 Cough: Secondary | ICD-10-CM | POA: Diagnosis not present

## 2017-08-03 DIAGNOSIS — M542 Cervicalgia: Secondary | ICD-10-CM | POA: Diagnosis not present

## 2017-08-03 DIAGNOSIS — R0982 Postnasal drip: Secondary | ICD-10-CM | POA: Diagnosis not present

## 2017-08-03 DIAGNOSIS — S2242XA Multiple fractures of ribs, left side, initial encounter for closed fracture: Secondary | ICD-10-CM | POA: Diagnosis not present

## 2017-08-03 DIAGNOSIS — J069 Acute upper respiratory infection, unspecified: Secondary | ICD-10-CM | POA: Diagnosis not present

## 2017-08-03 DIAGNOSIS — I10 Essential (primary) hypertension: Secondary | ICD-10-CM | POA: Diagnosis not present

## 2017-08-16 DIAGNOSIS — H35341 Macular cyst, hole, or pseudohole, right eye: Secondary | ICD-10-CM | POA: Diagnosis not present

## 2017-08-16 DIAGNOSIS — H35372 Puckering of macula, left eye: Secondary | ICD-10-CM | POA: Diagnosis not present

## 2017-08-16 DIAGNOSIS — Z961 Presence of intraocular lens: Secondary | ICD-10-CM | POA: Diagnosis not present

## 2017-08-16 DIAGNOSIS — H26493 Other secondary cataract, bilateral: Secondary | ICD-10-CM | POA: Diagnosis not present

## 2017-09-07 ENCOUNTER — Other Ambulatory Visit: Payer: Self-pay | Admitting: Family

## 2017-09-08 ENCOUNTER — Other Ambulatory Visit: Payer: Self-pay

## 2017-09-08 ENCOUNTER — Ambulatory Visit: Payer: Medicare Other | Admitting: Internal Medicine

## 2017-09-08 ENCOUNTER — Encounter: Payer: Self-pay | Admitting: Internal Medicine

## 2017-09-08 VITALS — BP 161/75 | HR 58 | Ht 68.0 in | Wt 143.0 lb

## 2017-09-08 DIAGNOSIS — I10 Essential (primary) hypertension: Secondary | ICD-10-CM

## 2017-09-08 DIAGNOSIS — I5022 Chronic systolic (congestive) heart failure: Secondary | ICD-10-CM | POA: Diagnosis not present

## 2017-09-08 NOTE — Progress Notes (Signed)
PCP: Sharion Balloon, FNP Primary Cardiologist:  Hochrein Primary EP: [name] is a 77 y.o. male who presents today for routine electrophysiology followup.  Since last being seen in our clinic, the patient reports doing very well.  Today, he denies symptoms of palpitations, chest pain, shortness of breath,  lower extremity edema, dizziness, presyncope, syncope, or ICD shocks.  The patient is otherwise without complaint today.   Past Medical History:  Diagnosis Date  . Arthritis   . CAD (coronary artery disease)    Nonobstructive cath 2003  . Carotid artery disease ([place])   . COPD (chronic obstructive pulmonary disease) ([place])   . Depression   . Dyslipidemia   . EtOH dependence ([place])   . Hyperlipidemia   . Hypertension   . Insomnia   . Nonischemic cardiomyopathy ([place])    EF was 20% (60% last echo 2012)  . Pacemaker    and defibrillator  . Peptic ulcer disease   . Tobacco abuse    Past Surgical History:  Procedure Laterality Date  . BIV ICD GENERTAOR CHANGE OUT N/A 03/06/2014   [employer] Gen change by Jorge Rayann Heman [name] CRT-D with LV1 header)  . biventricular defibrillator implantation     [employer] and pacemaker  . CATARACT EXTRACTION W/PHACO Right 12/10/2015   Procedure: CATARACT EXTRACTION PHACO AND INTRAOCULAR LENS PLACEMENT RIGHT EYE; CDE: 19.30;  Surgeon: Tonny Branch, MD;  Location: [facility];  Service: Ophthalmology;  Laterality: Right;  . CATARACT EXTRACTION W/PHACO Left 01/14/2016   Procedure: CATARACT EXTRACTION PHACO AND INTRAOCULAR LENS PLACEMENT LEFT EYE; CDE:  8.03;  Surgeon: Tonny Branch, MD;  Location: [facility];  Service: Ophthalmology;  Laterality: Left;  . CORONARY STENT PLACEMENT    . Ulcer surgery     repair of stomach ulcer    ROS- all systems are reviewed and negative except as per HPI above  Current Outpatient Medications  Medication Sig Dispense Refill  . acitretin (SORIATANE) 25 MG capsule     . ALPRAZolam (XANAX) 1 MG  tablet Take 1 tablet (1 mg total) 2 (two) times daily as needed by mouth. 60 tablet 5  . amLODipine (NORVASC) 10 MG tablet TAKE 1 TABLET BY MOUTH ONCE DAILY 90 tablet 1  . aspirin 81 MG tablet Take 81 mg by mouth daily.    Marland Kitchen augmented betamethasone dipropionate (DIPROLENE-AF) 0.05 % cream     . calcipotriene (DOVONOX) 0.005 % cream Apply topically 2 (two) times daily. 60 g 0  . cetirizine (ZYRTEC) 10 MG tablet Take 10 mg by mouth daily as needed for allergies.    Marland Kitchen ketoconazole (NIZORAL) 2 % cream Apply 1 application topically daily. 15 g 0  . lisinopril (PRINIVIL,ZESTRIL) 40 MG tablet TAKE 1 TABLET BY MOUTH ONCE DAILY 90 tablet 0  . metoprolol tartrate (LOPRESSOR) 100 MG tablet TAKE 1 TABLET BY MOUTH TWICE DAILY FOR HIGH BLOOD PRESSURE 180 tablet 0  . omeprazole (PRILOSEC) 20 MG capsule TAKE 1 CAPSULE BY MOUTH TWICE DAILY AS NEEDED FOR ACID REFLUX 180 capsule 0  . pravastatin (PRAVACHOL) 40 MG tablet TAKE 1 TABLET BY MOUTH ONCE DAILY WITH BREAKFAST 90 tablet 0   No current facility-administered medications for this visit.     Physical Exam: Vitals:   09/08/17 1420  BP: (!) 161/75  Pulse: (!) 58  SpO2: 100%  Weight: 143 lb (64.9 kg)  Height: 5\' 8"  (1.727 m)    GEN- The patient is well appearing, alert and oriented x 3  today.   Head- normocephalic, atraumatic Eyes-  Sclera clear, conjunctiva pink Ears- hearing intact Oropharynx- clear Lungs- Clear to ausculation bilaterally, normal work of breathing Chest- ICD pocket is well healed Heart- Regular rate and rhythm, no murmurs, rubs or gallops, PMI not laterally displaced GI- soft, NT, ND, + BS Extremities- no clubbing, cyanosis, or edema  ICD interrogation- reviewed in detail today,  See PACEART report  ekg tracing 10/18/16 reveals sinus with V pacing  Assessment and Plan:  1.  Chronic systolic dysfunction/ nonischemic CM euvolemic today Stable on an appropriate medical regimen Normal BiVICD function See Pace Art report No  changes today  2. HTN Elevated today He feels that his BP has been good at home.  Does not wish to make changes today  Will follow device remotely with [facility] Return to see me in 1 year Overdue to see Jorge Randa Ngo MD, [facility] 09/08/2017 2:42 PM

## 2017-09-08 NOTE — Patient Instructions (Addendum)
Medication Instructions:  Continue all current medications.  Labwork: none  Testing/Procedures: none  Follow-Up: Your physician wants you to follow up in:  1 year.  You will receive a reminder letter in the mail one-two months in advance.  If you don't receive a letter, please call our office to schedule the follow up appointment.  - Dr. Rayann Heman.   Any Other Special Instructions Will Be Listed Below (If Applicable). Remote monitoring is used to monitor your Pacemaker of ICD from home. This monitoring reduces the number of office visits required to check your device to one time per year. It allows [place] to keep an eye on the functioning of your device to ensure it is working properly. You are scheduled for a device check from home on 10/23/17. You may send your transmission at any time that day. If you have a wireless device, the transmission will be sent automatically. After your physician reviews your transmission, you will receive a postcard with your next transmission date.  If you need a refill on your cardiac medications before your next appointment, please call your pharmacy.  2 gm sodium diet

## 2017-09-09 ENCOUNTER — Other Ambulatory Visit: Payer: Self-pay | Admitting: Family

## 2017-09-12 LAB — CUP PACEART INCLINIC DEVICE CHECK
Date Time Interrogation Session: 20190322040000
HIGH POWER IMPEDANCE MEASURED VALUE: 62 Ohm
Implantable Lead Implant Date: 20040312
Implantable Lead Location: 753859
Implantable Lead Location: 753860
Implantable Lead Model: 158
Implantable Lead Model: 4513
Implantable Lead Serial Number: 129158
Lead Channel Impedance Value: 566 Ohm
Lead Channel Impedance Value: 589 Ohm
Lead Channel Impedance Value: 830 Ohm
Lead Channel Pacing Threshold Amplitude: 0.8 V
Lead Channel Pacing Threshold Pulse Width: 0.4 ms
Lead Channel Pacing Threshold Pulse Width: 0.4 ms
Lead Channel Pacing Threshold Pulse Width: 0.8 ms
Lead Channel Sensing Intrinsic Amplitude: 23.9 mV
Lead Channel Setting Pacing Amplitude: 2 V
Lead Channel Setting Pacing Pulse Width: 0.8 ms
Lead Channel Setting Sensing Sensitivity: 0.6 mV
MDC IDC LEAD IMPLANT DT: 20040312
MDC IDC LEAD IMPLANT DT: 20040312
MDC IDC LEAD LOCATION: 753858
MDC IDC LEAD SERIAL: 209446
MDC IDC LEAD SERIAL: 407264
MDC IDC MSMT LEADCHNL LV PACING THRESHOLD AMPLITUDE: 2.2 V
MDC IDC MSMT LEADCHNL RA PACING THRESHOLD AMPLITUDE: 1 V
MDC IDC MSMT LEADCHNL RA SENSING INTR AMPL: 2.6 mV
MDC IDC MSMT LEADCHNL RV SENSING INTR AMPL: 23.5 mV
MDC IDC PG IMPLANT DT: 20150917
MDC IDC SET LEADCHNL LV PACING AMPLITUDE: 3 V
MDC IDC SET LEADCHNL LV SENSING SENSITIVITY: 1 mV
MDC IDC SET LEADCHNL RV PACING AMPLITUDE: 2.5 V
MDC IDC SET LEADCHNL RV PACING PULSEWIDTH: 0.4 ms
Pulse Gen Serial Number: 106298

## 2017-09-18 ENCOUNTER — Other Ambulatory Visit: Payer: Self-pay | Admitting: Family

## 2017-09-25 ENCOUNTER — Other Ambulatory Visit: Payer: Self-pay | Admitting: Family

## 2017-09-25 DIAGNOSIS — I10 Essential (primary) hypertension: Secondary | ICD-10-CM

## 2017-09-30 ENCOUNTER — Other Ambulatory Visit: Payer: Self-pay | Admitting: Family

## 2017-09-30 DIAGNOSIS — I10 Essential (primary) hypertension: Secondary | ICD-10-CM

## 2017-10-16 ENCOUNTER — Ambulatory Visit (INDEPENDENT_AMBULATORY_CARE_PROVIDER_SITE_OTHER): Payer: Medicare Other | Admitting: *Deleted

## 2017-10-16 ENCOUNTER — Encounter: Payer: Self-pay | Admitting: *Deleted

## 2017-10-16 VITALS — BP 135/61 | HR 59 | Ht 65.0 in | Wt 132.0 lb

## 2017-10-16 DIAGNOSIS — Z0000 Encounter for general adult medical examination without abnormal findings: Secondary | ICD-10-CM

## 2017-10-16 NOTE — Progress Notes (Addendum)
Subjective:   [name] is a 77 y.o. male who presents for an Initial Medicare Annual Wellness Visit. He is accompanied by his daughter today. He lives at home with his ex-wife. They have been divorced for 30+ years and recently worked out a plutonic living arrangement. He was living with his daughter until a few months ago. He has 2 adult daughters and 2 adult sons. He is at home during the day by himself but she is there at night. 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 He has some college although he did not finish high school. He spent 4 years in the [employer] and used the GI bill to take college courses. He is not very physically active and reports feeling tired in the morning after taking his medications. He takes Zyrtec, metoprolol, and pravastatin in the mornings.   Review of Systems  He reports that his health is about the same as last year.   Psych: Has some depression. Several days a week he doesn't feel motivated and feel down. He is also tired often, especially in the morning after taking his medications. His appetite has decreased as well. He does have a lot of anxiety and tends to worry about everything. He takes Xanax 1mg  BID to help with this. His first pill is in the morning and he takes the 2nd pill before bed to help him sleep. He has tried mediation for depression in the past but they are unsure of the name. He discontinued it because he and his daughter were concerned about mixing it with alcohol and he did not want to d/c his beer in the evening. He takes Xanax BID for anxiety but notices that it wears off and he is anxious and irritable in there afternoons. He does not take his second dose until bedtime because it helps him sleep.   Cardiac Risk Factors include: advanced age (>7men, >81 women);dyslipidemia;hypertension;male gender;sedentary lifestyle;microalbuminuria  Musc: had some right knee pain and weakness for a while but it has  resolved. Sustained a fall due to the weakness  GI: decreased appetite  Skin: Psoriasis. Oral medications help but unable to afford topicals. Oral meds are expensive as well but he buys them because they work.   Resp: Daughter reports that he snores loudly and has apneic events where he gasps for air. This may contribute to fatigue during the day as well. He was scheduled for a sleep study in the past but cancelled it because he didn't think he would sleep well away from home.  Reviewed most recent labwork. Has history of hyponatremia that appears chronic and stable. He isn't sure why his sodium is low. He has been instructed to increase sodium intake through diet.   Objective:    Today's Vitals   10/16/17 1007  BP: 135/61  Pulse: (!) 59  Weight: 132 lb (59.9 kg)  Height: 5\' 5"  (1.651 m)   Body mass index is 21.97 kg/m.  Advanced Directives 01/14/2016 12/10/2015 12/08/2015 03/06/2014 12/19/2011  Does Patient Have a Medical Advance Directive? No No No No Patient does not have advance directive;Patient would not like information  Would patient like information on creating a medical advance directive? Yes - Educational materials given No - patient declined information No - patient declined information Yes Higher education careers adviser given -  Some encounter information is confidential and restricted. Go to Review Flowsheets activity to see all data.    Current Medications (verified)  Outpatient Encounter Medications as of 10/16/2017  Medication Sig  . acitretin (SORIATANE) 25 MG capsule   . ALPRAZolam (XANAX) 1 MG tablet Take 1 tablet (1 mg total) 2 (two) times daily as needed by mouth.  Marland Kitchen amLODipine (NORVASC) 10 MG tablet TAKE 1 TABLET BY MOUTH ONCE DAILY  . aspirin 81 MG tablet Take 81 mg by mouth daily.  Marland Kitchen lisinopril (PRINIVIL,ZESTRIL) 40 MG tablet TAKE 1 TABLET BY MOUTH ONCE DAILY  . metoprolol tartrate (LOPRESSOR) 100 MG tablet TAKE 1 TABLET BY MOUTH TWICE DAILY FOR HIGH BLOOD PRESSURE  .  omeprazole (PRILOSEC) 20 MG capsule TAKE 1 CAPSULE BY MOUTH TWICE DAILY AS NEEDED FOR ACID REFLUX  . pravastatin (PRAVACHOL) 40 MG tablet TAKE 1 TABLET BY MOUTH ONCE DAILY WITH BREAKFAST  . augmented betamethasone dipropionate (DIPROLENE-AF) 0.05 % cream   . calcipotriene (DOVONOX) 0.005 % cream Apply topically 2 (two) times daily. (Patient not taking: Reported on 10/16/2017)  . cetirizine (ZYRTEC) 10 MG tablet Take 10 mg by mouth daily as needed for allergies.  Marland Kitchen ketoconazole (NIZORAL) 2 % cream Apply 1 application topically daily. (Patient not taking: Reported on 10/16/2017)  . lisinopril (PRINIVIL,ZESTRIL) 40 MG tablet TAKE 1 TABLET BY MOUTH ONCE DAILY   No facility-administered encounter medications on file as of 10/16/2017.     Allergies (verified) Sudafed [pseudoephedrine hcl]; Avelox [moxifloxacin hcl in nacl]; and Prednisone   History: Past Medical History:  Diagnosis Date  . Arthritis   . CAD (coronary artery disease)    Nonobstructive cath 2003  . Carotid artery disease ([place])   . COPD (chronic obstructive pulmonary disease) ([place])   . Depression   . Dyslipidemia   . EtOH dependence ([place])   . Hyperlipidemia   . Hypertension   . Insomnia   . Nonischemic cardiomyopathy ([place])    EF was 20% (60% last echo 2012)  . Pacemaker    and defibrillator  . Peptic ulcer disease   . Tobacco abuse    Past Surgical History:  Procedure Laterality Date  . BIV ICD GENERTAOR CHANGE OUT N/A 03/06/2014   [employer] Gen change by Dr Rayann Heman Beckie Salts CRT-D with LV1 header)  . biventricular defibrillator implantation     [employer] and pacemaker  . CATARACT EXTRACTION W/PHACO Right 12/10/2015   Procedure: CATARACT EXTRACTION PHACO AND INTRAOCULAR LENS PLACEMENT RIGHT EYE; CDE: 19.30;  Koch: Tonny Branch, MD;  Location: [facility];  Service: Ophthalmology;  Laterality: Right;  . CATARACT EXTRACTION W/PHACO Left 01/14/2016   Procedure: CATARACT EXTRACTION PHACO AND INTRAOCULAR LENS  PLACEMENT LEFT EYE; CDE:  8.03;  Koch: Tonny Branch, MD;  Location: [facility];  Service: Ophthalmology;  Laterality: Left;  . CORONARY STENT PLACEMENT    . Ulcer surgery     repair of stomach ulcer   Family History  Problem Relation Age of Onset  . Heart failure Father   . Colon polyps Sister   . GI Bleed Sister   . GI Bleed Brother   . Psoriasis Daughter   . Arthritis/Rheumatoid Daughter   . Cancer Brother   . Heart disease Brother   . Heart disease Brother    Social History   Socioeconomic History  . Marital status: Divorced    Spouse name: Not on file  . Number of children: 4  . Years of education: Some college  . Highest education level: Some college, no degree  Occupational History  . Occupation: Retired    Fish farm manager: [employer]  Social Needs  . Financial  resource strain: Not very hard  . Food insecurity:    Worry: Never true    Inability: Never true  . Transportation needs:    Medical: No    Non-medical: No  Tobacco Use  . Smoking status: Light Tobacco Smoker    Packs/day: 0.25    Years: 45.00    Pack years: 11.25    Types: Cigarettes  . Smokeless tobacco: Former Systems developer    Types: Chew    Quit date: 06/20/1989  . Tobacco comment: he is not interested in cessation  Substance and Sexual Activity  . Alcohol use: Yes    Alcohol/week: 3.6 oz    Types: 6 Cans of beer per week    Comment: drings 4-6 beers per day  . Drug use: No    Types: Marijuana    Comment: stopped using marijuana- last used 12/04/2015  . Sexual activity: Not Currently    Birth control/protection: None  Lifestyle  . Physical activity:    Days per week: 0 days    Minutes per session: 0 min  . Stress: Very much  Relationships  . Social connections:    Talks on phone: More than three times a week    Gets together: More than three times a week    Attends religious service: Never    Active member of club or organization: No    Attends meetings of clubs or organizations: Never     Relationship status: Divorced  Other Topics Concern  . Not on file  Social History Narrative  . Not on file   Tobacco Counseling Ready to quit: No Counseling given: Yes Comment: he is not interested in cessation   Clinical Intake:  Pre-visit preparation completed: No  Pain : No/denies pain  Nutritional Status: BMI of 19-24  Normal  How often do you need to have someone help you when you read instructions, pamphlets, or other written materials from your doctor or pharmacy?: 1 - Never What is the last grade level you completed in school?: some college classes  Interpreter Needed?: No  Information entered by :: [employer]  Activities of Daily Living In your present state of health, do you have any difficulty performing the following activities: 10/16/2017 05/02/2017  Hearing? N N  Vision? 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 May be able to fix it with a laser but couldn't guarentee it. Had recent eye exam -  Difficulty concentrating or making decisions? N N  Walking or climbing stairs? N N  Comment steps into home. No railings on the front but someone is going to put them up. -  Dressing or bathing? N N  Doing errands, shopping? N N  Preparing Food and eating ? N -  Using the Toilet? N -  In the past six months, have you accidently leaked urine? Y -  Comment Some urge incontinence -  Do you have problems with loss of bowel control? N -  Managing your Medications? N -  Comment daughter gives him his medications -  Managing your Finances? N -  Housekeeping or managing your Housekeeping? N -  Some recent data might be hidden     Immunizations and Health Maintenance Immunization History  Administered Date(s) Administered  . Influenza Whole 05/13/2008  . Influenza, High Dose Seasonal PF 04/24/2014, 04/09/2015, 03/28/2017  . Influenza,inj,Quad PF,6+ Mos 03/15/2016  . Influenza-Unspecified 03/20/2013  . Pneumococcal Conjugate-13 09/24/2015  . Pneumococcal  Polysaccharide-23 03/28/2017   Health Maintenance Due  Topic Date Due  .  TETANUS/TDAP  06[birth year]08/1960    Patient Care Team: Sharion Balloon, FNP as PCP - General (Nurse Practitioner)  Minus Breeding as Cardiologist  One ER visit about 3 months ago for a cough. No other ER visits, hospitalizations, or surgeries in the past 12 months.     Assessment:   This is a routine wellness examination for [employer].  Hearing/Vision screen No deficits noted during visit.   Dietary issues and exercise activities discussed:  Diet Has a decreased appetite. Eats quick meals like TV dinners and cereal. Usually eats breakfast, light snack for lunch, and then a larger meal at supper. He does drink Ensure from time to time.  Drinks one bottle of water a day and has a few beers in the evening.   Current Exercise Habits: The patient does not participate in regular exercise at present, Exercise limited by: orthopedic condition(s);cardiac condition(s)  Goals    . Exercise 150 min/wk Moderate Activity            Increase socialization. Attend programs at the [place] at the [facility] 2/9 Scores 10/16/2017 05/02/2017 12/23/2016 11/09/2016  PHQ - 2 Score 2 1 6  0  PHQ- 9 Score 5 - 13 -   Referred to PCP to discuss further  Fall Risk Fall Risk  10/16/2017 05/02/2017 12/23/2016 11/09/2016 10/21/2016  Falls in the past year? Yes Yes No No No  Number falls in past yr: 1 1 - - -  Injury with Fall? No No - - -  Risk for fall due to : History of fall(s);Impaired balance/gait - - - -  Follow up Falls prevention discussed - - - -    Is the patient's home free of loose throw rugs in walkways, pet beds, electrical cords, etc?   yes      Grab bars in the bathroom? yes in the shower      Handrails on the stairs?   no rails on front porch stairs. Has almost fallen. He does have rails on the back porch and      will use those to enter the house until front rails are installed.       Adequate  lighting?   yes    Cognitive Function: MMSE - Mini Mental State Exam 10/16/2017  Orientation to time 3  Orientation to Place 4  Registration 3  Attention/ Calculation 2  Recall 1  Language- name 2 objects 2  Language- repeat 1  Language- follow 3 step command 3  Language- read & follow direction 1  Write a sentence 1  Copy design 1  Total score 22  No baseline to compare to. Patient was able to answer questions during the visit appropriately. They do not report any odd behavior or major memory loss at home. Exam may have been skewed by anxiety. Stated that the year was 2018 instead of 2019 and the date was the 30th instead of the 29th. Michela Pitcher that the town was [place] but he does live in [place].       Screening Tests Health Maintenance  Topic Date Due  . TETANUS/TDAP  06/08/1960  . INFLUENZA VACCINE  01/18/2018  . COLONOSCOPY  01/10/2019  . PNA vac Low Risk Adult  Completed     Cancer Screenings: Lung: Low Dose CT Chest recommended if Age 60-80 years, 30 pack-year currently smoking OR have quit w/in 15years. Patient does qualify. Colorectal: due 2020. Patient is reluctant to have another but I explained the  importance since he has had precancerous polyps removed more than once.      Plan:  Follow up scheduled with PCP -Discuss fatigue. Possibly due to metoprolol or zyrtec since he notices it is worse after taking morning medications. Recommended taking zyrtec at night. Can try 1/2 dose of metoprolol in the morning for a few days to see if symptoms improve. Record blood pressure and bring log to appointment.  -Discuss anxiety. May benefit from longer acting benzodiazepine like clonazepam or lorazepam instead of alprazolam.  Patient is agreeable to change if PCP thinks it is appropriate.  -Discuss potential causes of hyponatremia. Is this causing his fatigue and maybe worsening his anxiety?   Increase sodium intake Increase intake of water with added  electrolytes or a sports drink like Gatorade Cut off liquids several hours before bed Decrease alcohol intake Stop smoking Keep appts with cardiologists Drink Ensure of Boost in additional to normal meals. May need to d/c if potassium elevates again.  Increase activity level. Discussed attending programs at the [facility] He has considered this in the past.  Recommend TD if injured Empty bladder every 3 hours to decrease urge incontinence Home sleep study recommended  I have personally reviewed and noted the following in the patient's chart:   . Medical and social history . Use of alcohol, tobacco or illicit drugs  . Current medications and supplements . Functional ability and status . Nutritional status . Physical activity . Advanced directives . List of other physicians . Hospitalizations, surgeries, and ER visits in previous 12 months . Vitals . Screenings to include cognitive, depression, and falls . Referrals and appointments  In addition, I have reviewed and discussed with patient certain preventive protocols, quality metrics, and best practice recommendations. A written personalized care plan for preventive services as well as general preventive health recommendations were provided to patient.     Chong Sicilian, RN   10/16/2017     I have reviewed and agree with the above AWV documentation.   Evelina Dun, FNP

## 2017-10-16 NOTE — Patient Instructions (Addendum)
  Mr. [name] , Thank you for taking time to come for your Medicare Wellness Visit. I appreciate your ongoing commitment to your health goals. Please review the following plan we discussed and let me know if I can assist you in the future.   These are the goals we discussed: Goals    . Exercise 150 min/wk Moderate Activity       This is a list of the screening recommended for you and due dates:  Health Maintenance  Topic Date Due  . Tetanus Vaccine  06/08/1960  . Flu Shot  01/18/2018  . Colon Cancer Screening  01/10/2019  . Pneumonia vaccines  Completed   Increase sodium intake Increase intake of water with added electrolytes or a sports drink like Gatorade Cut off liquids several hours before bed Decrease alcohol intake Stop smoking Drink Ensure of Boost in additional to normal meals. May need to d/c if potassium elevates again.  Increase activity level. Attend programs at the [facility]. Empty bladder every 3 hours

## 2017-10-23 ENCOUNTER — Ambulatory Visit (INDEPENDENT_AMBULATORY_CARE_PROVIDER_SITE_OTHER): Payer: Medicare Other | Admitting: *Deleted

## 2017-10-23 DIAGNOSIS — I428 Other cardiomyopathies: Secondary | ICD-10-CM | POA: Diagnosis not present

## 2017-10-24 NOTE — Progress Notes (Signed)
Remote ICD transmission.   

## 2017-10-25 ENCOUNTER — Encounter: Payer: Self-pay | Admitting: Cardiology

## 2017-10-26 ENCOUNTER — Encounter: Payer: Self-pay | Admitting: Cardiology

## 2017-10-30 ENCOUNTER — Encounter: Payer: Self-pay | Admitting: Family

## 2017-10-30 ENCOUNTER — Ambulatory Visit (INDEPENDENT_AMBULATORY_CARE_PROVIDER_SITE_OTHER): Payer: Medicare Other | Admitting: Family

## 2017-10-30 VITALS — BP 138/74 | HR 52 | Temp 96.9°F | Ht 65.0 in | Wt 129.0 lb

## 2017-10-30 DIAGNOSIS — I10 Essential (primary) hypertension: Secondary | ICD-10-CM

## 2017-10-30 DIAGNOSIS — E785 Hyperlipidemia, unspecified: Secondary | ICD-10-CM

## 2017-10-30 DIAGNOSIS — F17200 Nicotine dependence, unspecified, uncomplicated: Secondary | ICD-10-CM | POA: Diagnosis not present

## 2017-10-30 DIAGNOSIS — F411 Generalized anxiety disorder: Secondary | ICD-10-CM

## 2017-10-30 DIAGNOSIS — K219 Gastro-esophageal reflux disease without esophagitis: Secondary | ICD-10-CM

## 2017-10-30 MED ORDER — ALPRAZOLAM 1 MG PO TABS: 1.0000 mg | ORAL_TABLET | Freq: Two times a day (BID) | ORAL | 5 refills | Status: DC | PRN

## 2017-10-30 MED ORDER — ESCITALOPRAM OXALATE 10 MG PO TABS: 10.0000 mg | ORAL_TABLET | Freq: Every day | ORAL | 3 refills | Status: DC

## 2017-10-30 NOTE — Patient Instructions (Signed)

## 2017-10-30 NOTE — Progress Notes (Signed)
Subjective:    Patient ID: [name], male    DOB: 10-Dec-1940, 77 y.o.   MRN: [id number]  Chief Complaint  Patient presents with  . Medical Management of Chronic Issues   Pt presents to the office today for chronic follow up. PT is followed by Cardiologists for CAD, CHF, Atherosclerosis, Cardiomyopathy, and Ventricular tachycardia every 2 years.  PT has pacemaker and is followed annually for this.    Pt is followed by Dermatologists for psoriasis  states this is improving since starting the medications.   Hyperlipidemia  This is a chronic problem. The current episode started more than 1 year ago. The problem is controlled. Recent lipid tests were reviewed and are normal. Factors aggravating his hyperlipidemia include atypical antipsychotics. Pertinent negatives include no shortness of breath. Current antihyperlipidemic treatment includes statins. The current treatment provides moderate improvement of lipids. Risk factors for coronary artery disease include dyslipidemia, male sex, hypertension and a sedentary lifestyle.  Hypertension  This is a chronic problem. The current episode started more than 1 year ago. The problem has been resolved since onset. The problem is controlled. Associated symptoms include anxiety and malaise/fatigue. Pertinent negatives include no headaches, peripheral edema or shortness of breath. Risk factors for coronary artery disease include dyslipidemia and obesity. The current treatment provides moderate improvement. Hypertensive end-organ damage includes CAD/MI and heart failure.  Gastroesophageal Reflux  He complains of heartburn and a hoarse voice. He reports no belching or no coughing. This is a chronic problem. The current episode started more than 1 year ago. The problem occurs occasionally. Risk factors include obesity. He has tried a PPI for the symptoms. The treatment provided moderate relief.  Anxiety  Presents for follow-up visit. Symptoms include  excessive worry, irritability and nervous/anxious behavior. Patient reports no decreased concentration or shortness of breath. Symptoms occur most days. The severity of symptoms is moderate.        Review of Systems  Constitutional: Positive for irritability and malaise/fatigue.  HENT: Positive for hoarse voice.   Respiratory: Negative for cough and shortness of breath.   Gastrointestinal: Positive for heartburn.  Neurological: Negative for headaches.  Psychiatric/Behavioral: Negative for decreased concentration. The patient is nervous/anxious.   All other systems reviewed and are negative.      Objective:   Physical Exam  Constitutional: He is oriented to person, place, and time. He appears well-developed and well-nourished. No distress.  HENT:  Head: Normocephalic.  Right Ear: External ear normal.  Left Ear: External ear normal.  Mouth/Throat: Oropharynx is clear and moist.  Eyes: Pupils are equal, round, and reactive to light. Right eye exhibits no discharge. Left eye exhibits no discharge.  Neck: Normal range of motion. Neck supple. No thyromegaly present.  Cardiovascular: Normal rate, regular rhythm, normal heart sounds and intact distal pulses.  No murmur heard. Pulmonary/Chest: Effort normal and breath sounds normal. No respiratory distress. He has no wheezes.  Abdominal: Soft. Bowel sounds are normal. He exhibits no distension. There is no tenderness.  Musculoskeletal: Normal range of motion. He exhibits no edema or tenderness.  Neurological: He is alert and oriented to person, place, and time. He has normal reflexes. No cranial nerve deficit.  Skin: Skin is warm and dry. Rash noted. No erythema.  Psychiatric: He has a normal mood and affect. His behavior is normal. Judgment and thought content normal.  Vitals reviewed.     BP 138/74   Pulse (!) 52   Temp (!) 96.9 F (36.1 C) (Oral)  Ht '5\' 5"'  (1.651 m)   Wt 129 lb (58.5 kg)   BMI 21.47 kg/m      Assessment  & Plan:  [name] comes in today with chief complaint of Medical Management of Chronic Issues   Diagnosis and orders addressed:  1. Essential hypertension - CMP14+EGFR  2. Gastroesophageal reflux disease, esophagitis presence not specified - CMP14+EGFR  3. Hyperlipidemia, unspecified hyperlipidemia type - CMP14+EGFR  4. TOBACCO ABUSE - CMP14+EGFR  5. GAD (generalized anxiety disorder) Will start lexapro 10 mg today Stress management discussed RTO n 6 weeks - escitalopram (LEXAPRO) 10 MG tablet; Take 1 tablet (10 mg total) by mouth daily.  Dispense: 90 tablet; Refill: 3 - ALPRAZolam (XANAX) 1 MG tablet; Take 1 tablet (1 mg total) by mouth 2 (two) times daily as needed.  Dispense: 60 tablet; Refill: 5 - CMP14+EGFR   Labs pending Health Maintenance reviewed Diet and exercise encouraged  Follow up plan: 6 weeks    Evelina Dun, FNP

## 2017-10-31 ENCOUNTER — Other Ambulatory Visit: Payer: Self-pay | Admitting: Family

## 2017-10-31 DIAGNOSIS — E875 Hyperkalemia: Secondary | ICD-10-CM

## 2017-10-31 LAB — CMP14+EGFR
A/G RATIO: 1.5 (ref 1.2–2.2)
ALT: 15 IU/L (ref 0–44)
AST: 27 IU/L (ref 0–40)
Albumin: 4.6 g/dL (ref 3.5–4.8)
Alkaline Phosphatase: 76 IU/L (ref 39–117)
BUN/Creatinine Ratio: 18 (ref 10–24)
BUN: 24 mg/dL (ref 8–27)
Bilirubin Total: 0.3 mg/dL (ref 0.0–1.2)
CALCIUM: 10 mg/dL (ref 8.6–10.2)
CO2: 21 mmol/L (ref 20–29)
Chloride: 94 mmol/L — ABNORMAL LOW (ref 96–106)
Creatinine, Ser: 1.32 mg/dL — ABNORMAL HIGH (ref 0.76–1.27)
GFR, EST AFRICAN AMERICAN: 61 mL/min/{1.73_m2} (ref >59)
GFR, EST NON AFRICAN AMERICAN: 53 mL/min/{1.73_m2} — AB (ref >59)
GLOBULIN, TOTAL: 3 g/dL (ref 1.5–4.5)
Glucose: 80 mg/dL (ref 65–99)
POTASSIUM: 6.2 mmol/L — AB (ref 3.5–5.2)
Sodium: 132 mmol/L — ABNORMAL LOW (ref 134–144)
TOTAL PROTEIN: 7.6 g/dL (ref 6.0–8.5)

## 2017-11-02 ENCOUNTER — Other Ambulatory Visit: Payer: Medicare Other

## 2017-11-02 DIAGNOSIS — E875 Hyperkalemia: Secondary | ICD-10-CM

## 2017-11-02 LAB — BMP8+EGFR
BUN/Creatinine Ratio: 13 (ref 10–24)
BUN: 16 mg/dL (ref 8–27)
CALCIUM: 9.9 mg/dL (ref 8.6–10.2)
CO2: 25 mmol/L (ref 20–29)
CREATININE: 1.23 mg/dL (ref 0.76–1.27)
Chloride: 92 mmol/L — ABNORMAL LOW (ref 96–106)
GFR calc Af Amer: 66 mL/min/{1.73_m2} (ref >59)
GFR calc non Af Amer: 57 mL/min/{1.73_m2} — ABNORMAL LOW (ref >59)
GLUCOSE: 107 mg/dL — AB (ref 65–99)
POTASSIUM: 4.6 mmol/L (ref 3.5–5.2)
SODIUM: 126 mmol/L — AB (ref 134–144)

## 2017-11-10 ENCOUNTER — Telehealth: Payer: Self-pay | Admitting: Cardiology

## 2017-11-10 LAB — CUP PACEART REMOTE DEVICE CHECK
Battery Remaining Longevity: 72 mo
Date Time Interrogation Session: 20190506050800
HighPow Impedance: 60 Ohm
Implantable Lead Implant Date: 20040312
Implantable Lead Implant Date: 20040312
Implantable Lead Location: 753858
Implantable Lead Location: 753859
Implantable Lead Location: 753860
Implantable Lead Model: 158
Implantable Lead Serial Number: 129158
Implantable Lead Serial Number: 407264
Implantable Pulse Generator Implant Date: 20150917
Lead Channel Impedance Value: 800 Ohm
Lead Channel Pacing Threshold Amplitude: 0.8 V
Lead Channel Pacing Threshold Amplitude: 2.2 V
Lead Channel Pacing Threshold Pulse Width: 0.4 ms
Lead Channel Pacing Threshold Pulse Width: 0.4 ms
Lead Channel Setting Pacing Pulse Width: 0.8 ms
Lead Channel Setting Sensing Sensitivity: 0.6 mV
Lead Channel Setting Sensing Sensitivity: 1 mV
MDC IDC LEAD IMPLANT DT: 20040312
MDC IDC LEAD SERIAL: 209446
MDC IDC MSMT BATTERY REMAINING PERCENTAGE: 100 %
MDC IDC MSMT LEADCHNL LV PACING THRESHOLD PULSEWIDTH: 0.8 ms
MDC IDC MSMT LEADCHNL RA IMPEDANCE VALUE: 599 Ohm
MDC IDC MSMT LEADCHNL RA PACING THRESHOLD AMPLITUDE: 1 V
MDC IDC MSMT LEADCHNL RV IMPEDANCE VALUE: 627 Ohm
MDC IDC SET LEADCHNL LV PACING AMPLITUDE: 3 V
MDC IDC SET LEADCHNL RA PACING AMPLITUDE: 2 V
MDC IDC SET LEADCHNL RV PACING AMPLITUDE: 2.5 V
MDC IDC SET LEADCHNL RV PACING PULSEWIDTH: 0.4 ms
MDC IDC STAT BRADY RA PERCENT PACED: 0 %
MDC IDC STAT BRADY RV PERCENT PACED: 100 %
Pulse Gen Serial Number: 106298

## 2017-11-10 NOTE — Telephone Encounter (Signed)
Called patient. Informed him that he may had been called d/t his upcoming appt with Dr.Hochrein/[place] on 5/29 @1420 . Patient confirmed his appt.

## 2017-11-10 NOTE — Telephone Encounter (Signed)
Pt calling   Stating he received a call from nurse and returning it. Please call pt.

## 2017-11-13 ENCOUNTER — Encounter: Payer: Self-pay | Admitting: Cardiology

## 2017-11-13 NOTE — Progress Notes (Signed)
Cardiology Office Note   Date:  11/15/2017   ID:  Mekel, [place] [birth year], MRN [id number]  PCP:  Sharion Balloon, FNP  Cardiologist:   No primary care provider on file.   Chief Complaint  Patient presents with  . Coronary Artery Disease      History of Present Illness: [name] is a 77 y.o. male who presents for follow up of non ischemic cardiomyopathy.  Since I last saw him he has done well.  The patient denies any new symptoms such as chest discomfort, neck or arm discomfort. There has been no new shortness of breath, PND or orthopnea. There have been no reported palpitations, presyncope or syncope.     Past Medical History:  Diagnosis Date  . Arthritis   . CAD (coronary artery disease)    Nonobstructive cath 2012.  PCI to RCA 2003  . Carotid artery disease ([place])   . COPD (chronic obstructive pulmonary disease) ([place])   . Depression   . Dyslipidemia   . EtOH dependence ([place])   . Hyperlipidemia   . Hypertension   . Insomnia   . Nonischemic cardiomyopathy (HCC)    EF was 20% (60% last echo 2015)  . Pacemaker    and defibrillator  . Peptic ulcer disease   . Tobacco abuse     Past Surgical History:  Procedure Laterality Date  . BIV ICD GENERTAOR CHANGE OUT N/A 03/06/2014   [employer] Gen change by Dr Rayann Heman Beckie Salts CRT-D with LV1 header)  . biventricular defibrillator implantation     [employer] and pacemaker  . CATARACT EXTRACTION W/PHACO Right 12/10/2015   Procedure: CATARACT EXTRACTION PHACO AND INTRAOCULAR LENS PLACEMENT RIGHT EYE; CDE: 19.30;  Surgeon: Tonny Branch, MD;  Location: [facility];  Service: Ophthalmology;  Laterality: Right;  . CATARACT EXTRACTION W/PHACO Left 01/14/2016   Procedure: CATARACT EXTRACTION PHACO AND INTRAOCULAR LENS PLACEMENT LEFT EYE; CDE:  8.03;  Surgeon: Tonny Branch, MD;  Location: [facility];  Service: Ophthalmology;  Laterality: Left;  . Ulcer surgery     repair of stomach ulcer     Current Outpatient  Medications  Medication Sig Dispense Refill  . ALPRAZolam (XANAX) 1 MG tablet Take 1 tablet (1 mg total) by mouth 2 (two) times daily as needed. 60 tablet 5  . amLODipine (NORVASC) 10 MG tablet TAKE 1 TABLET BY MOUTH ONCE DAILY 90 tablet 1  . aspirin 81 MG tablet Take 81 mg by mouth daily.    Marland Kitchen augmented betamethasone dipropionate (DIPROLENE-AF) 0.05 % cream Apply topically daily.     . cetirizine (ZYRTEC) 10 MG tablet Take 10 mg by mouth daily as needed for allergies.    Marland Kitchen escitalopram (LEXAPRO) 10 MG tablet Take 1 tablet (10 mg total) by mouth daily. 90 tablet 3  . lisinopril (PRINIVIL,ZESTRIL) 40 MG tablet TAKE 1 TABLET BY MOUTH ONCE DAILY 90 tablet 0  . metoprolol tartrate (LOPRESSOR) 100 MG tablet TAKE 1 TABLET BY MOUTH TWICE DAILY FOR HIGH BLOOD PRESSURE 180 tablet 0  . omeprazole (PRILOSEC) 20 MG capsule TAKE 1 CAPSULE BY MOUTH TWICE DAILY AS NEEDED FOR ACID REFLUX 180 capsule 0  . pravastatin (PRAVACHOL) 40 MG tablet TAKE 1 TABLET BY MOUTH ONCE DAILY WITH BREAKFAST 90 tablet 0  . acitretin (SORIATANE) 25 MG capsule Take 25 mg by mouth daily before breakfast.     . calcipotriene (DOVONOX) 0.005 % cream Apply topically 2 (two) times daily. 60 g 0  . ketoconazole (NIZORAL) 2 %  cream Apply 1 application topically daily. 15 g 0   No current facility-administered medications for this visit.     Allergies:   Sudafed [pseudoephedrine hcl]; Prednisone; and Avelox [moxifloxacin hcl in nacl]     ROS:  Please see the history of present illness.   Otherwise, review of systems are positive for none.   All other systems are reviewed and negative.    PHYSICAL EXAM: VS:  BP 118/62   Pulse 63   Ht 5\' 5"  (1.651 m)   Wt 134 lb (60.8 kg)   BMI 22.30 kg/m  , BMI Body mass index is 22.3 kg/m.  GENERAL:  Well appearing NECK:  No jugular venous distention, waveform within normal limits, carotid upstroke brisk and symmetric, no bruits, no thyromegaly LUNGS:  Clear to auscultation  bilaterally CHEST:  Well healed ICD pocket.   HEART:  PMI not displaced or sustained,S1 and S2 within normal limits, no S3, no S4, no clicks, no rubs, no murmurs ABD:  Flat, positive bowel sounds normal in frequency in pitch, no bruits, no rebound, no guarding, no midline pulsatile mass, no hepatomegaly, no splenomegaly EXT:  2 plus pulses throughout, no edema, no cyanosis no clubbing     EKG:  EKG is ordered today. The ekg ordered today demonstrates sinus rhythm, rate of 63, left ventricular pacing 100% capture.   Recent Labs: 10/30/2017: ALT 15 11/02/2017: BUN 16; Creatinine, Ser 1.23; Potassium 4.6; Sodium 126    Lipid Panel    Component Value Date/Time   CHOL 183 05/02/2017 0940   TRIG 86 05/02/2017 0940   TRIG CANCELED 01/31/2014 1618   HDL 72 05/02/2017 0940   HDL CANCELED 01/31/2014 1618   CHOLHDL 2.5 05/02/2017 0940   LDLCALC 94 05/02/2017 0940      Wt Readings from Last 3 Encounters:  11/15/17 134 lb (60.8 kg)  10/30/17 129 lb (58.5 kg)  10/16/17 132 lb (59.9 kg)      Other studies Reviewed: Additional studies/ records that were reviewed today include: None. Review of the above records demonstrates:  Please see elsewhere in the note.     ASSESSMENT AND PLAN:   VENTRICULAR TACHYCARDIA:  He is up to date with ICD follow up.  No change in therapy.   CARDIOMOPATHY:  His EF was normal in 2015.  No follow up imaging is indicated at this time.  He will continue meds as listed.   TOBACCO ABUSE:   He cannot quit his 1/4 pack of cigs per day.  We talked about this again.   DYSLIPIDEMIA:   LDL/HDL ratio was very good.  No change in therapy.     Current medicines are reviewed at length with the patient today.  The patient does not have concerns regarding medicines.  The following changes have been made:  no change  Labs/ tests ordered today include: None  Orders Placed This Encounter  Procedures  . EKG 12-Lead     Disposition:   FU with me in one  year.     Signed, Minus Breeding, MD  11/15/2017 3:13 PM    [facility]

## 2017-11-15 ENCOUNTER — Encounter: Payer: Self-pay | Admitting: Cardiology

## 2017-11-15 ENCOUNTER — Ambulatory Visit: Payer: Medicare Other | Admitting: Cardiology

## 2017-11-15 VITALS — BP 118/62 | HR 63 | Ht 65.0 in | Wt 134.0 lb

## 2017-11-15 DIAGNOSIS — I428 Other cardiomyopathies: Secondary | ICD-10-CM | POA: Diagnosis not present

## 2017-11-15 DIAGNOSIS — I10 Essential (primary) hypertension: Secondary | ICD-10-CM | POA: Diagnosis not present

## 2017-11-15 NOTE — Patient Instructions (Addendum)

## 2017-11-25 ENCOUNTER — Other Ambulatory Visit: Payer: Self-pay | Admitting: Family

## 2017-11-25 DIAGNOSIS — E785 Hyperlipidemia, unspecified: Secondary | ICD-10-CM

## 2017-12-02 ENCOUNTER — Other Ambulatory Visit: Payer: Self-pay | Admitting: Family

## 2017-12-02 DIAGNOSIS — E785 Hyperlipidemia, unspecified: Secondary | ICD-10-CM

## 2017-12-11 ENCOUNTER — Ambulatory Visit: Payer: Medicare Other | Admitting: Family

## 2017-12-14 ENCOUNTER — Encounter: Payer: Self-pay | Admitting: Family

## 2017-12-19 ENCOUNTER — Other Ambulatory Visit: Payer: Self-pay | Admitting: *Deleted

## 2017-12-19 ENCOUNTER — Other Ambulatory Visit: Payer: Self-pay | Admitting: Family

## 2017-12-19 DIAGNOSIS — F411 Generalized anxiety disorder: Secondary | ICD-10-CM

## 2018-01-22 ENCOUNTER — Ambulatory Visit (INDEPENDENT_AMBULATORY_CARE_PROVIDER_SITE_OTHER): Payer: Medicare Other | Admitting: *Deleted

## 2018-01-22 DIAGNOSIS — I428 Other cardiomyopathies: Secondary | ICD-10-CM | POA: Diagnosis not present

## 2018-01-22 DIAGNOSIS — I5022 Chronic systolic (congestive) heart failure: Secondary | ICD-10-CM

## 2018-01-24 NOTE — Progress Notes (Signed)
Remote ICD transmission.   

## 2018-02-14 LAB — CUP PACEART REMOTE DEVICE CHECK
Battery Remaining Longevity: 72 mo
Battery Remaining Percentage: 100 %
Date Time Interrogation Session: 20190805043100
HighPow Impedance: 59 Ohm
Implantable Lead Implant Date: 20040312
Implantable Lead Implant Date: 20040312
Implantable Lead Location: 753858
Implantable Lead Model: 158
Implantable Lead Model: 4087
Implantable Lead Serial Number: 209446
Implantable Pulse Generator Implant Date: 20150917
Lead Channel Impedance Value: 820 Ohm
Lead Channel Pacing Threshold Amplitude: 0.8 V
Lead Channel Pacing Threshold Amplitude: 1 V
Lead Channel Pacing Threshold Amplitude: 2.2 V
Lead Channel Pacing Threshold Pulse Width: 0.4 ms
Lead Channel Setting Pacing Amplitude: 2.5 V
Lead Channel Setting Pacing Amplitude: 3 V
Lead Channel Setting Pacing Pulse Width: 0.8 ms
Lead Channel Setting Sensing Sensitivity: 0.6 mV
MDC IDC LEAD IMPLANT DT: 20040312
MDC IDC LEAD LOCATION: 753859
MDC IDC LEAD LOCATION: 753860
MDC IDC LEAD SERIAL: 129158
MDC IDC LEAD SERIAL: 407264
MDC IDC MSMT LEADCHNL LV PACING THRESHOLD PULSEWIDTH: 0.8 ms
MDC IDC MSMT LEADCHNL RA IMPEDANCE VALUE: 582 Ohm
MDC IDC MSMT LEADCHNL RA PACING THRESHOLD PULSEWIDTH: 0.4 ms
MDC IDC MSMT LEADCHNL RV IMPEDANCE VALUE: 588 Ohm
MDC IDC SET LEADCHNL LV SENSING SENSITIVITY: 1 mV
MDC IDC SET LEADCHNL RA PACING AMPLITUDE: 2 V
MDC IDC SET LEADCHNL RV PACING PULSEWIDTH: 0.4 ms
MDC IDC STAT BRADY RA PERCENT PACED: 0 %
MDC IDC STAT BRADY RV PERCENT PACED: 100 %
Pulse Gen Serial Number: 106298

## 2018-03-17 ENCOUNTER — Other Ambulatory Visit: Payer: Self-pay | Admitting: Family

## 2018-03-24 ENCOUNTER — Other Ambulatory Visit: Payer: Self-pay | Admitting: Family

## 2018-03-24 DIAGNOSIS — I10 Essential (primary) hypertension: Secondary | ICD-10-CM

## 2018-04-21 DIAGNOSIS — I4891 Unspecified atrial fibrillation: Secondary | ICD-10-CM | POA: Diagnosis not present

## 2018-04-21 DIAGNOSIS — I110 Hypertensive heart disease with heart failure: Secondary | ICD-10-CM | POA: Diagnosis not present

## 2018-04-21 DIAGNOSIS — Z95810 Presence of automatic (implantable) cardiac defibrillator: Secondary | ICD-10-CM | POA: Diagnosis not present

## 2018-04-21 DIAGNOSIS — Z888 Allergy status to other drugs, medicaments and biological substances status: Secondary | ICD-10-CM | POA: Diagnosis not present

## 2018-04-21 DIAGNOSIS — Z79899 Other long term (current) drug therapy: Secondary | ICD-10-CM | POA: Diagnosis not present

## 2018-04-21 DIAGNOSIS — K222 Esophageal obstruction: Secondary | ICD-10-CM | POA: Diagnosis not present

## 2018-04-21 DIAGNOSIS — K219 Gastro-esophageal reflux disease without esophagitis: Secondary | ICD-10-CM | POA: Diagnosis not present

## 2018-04-21 DIAGNOSIS — I700 Atherosclerosis of aorta: Secondary | ICD-10-CM | POA: Diagnosis not present

## 2018-04-21 DIAGNOSIS — Z955 Presence of coronary angioplasty implant and graft: Secondary | ICD-10-CM | POA: Diagnosis not present

## 2018-04-21 DIAGNOSIS — I509 Heart failure, unspecified: Secondary | ICD-10-CM | POA: Diagnosis not present

## 2018-04-21 DIAGNOSIS — R591 Generalized enlarged lymph nodes: Secondary | ICD-10-CM | POA: Diagnosis not present

## 2018-04-21 DIAGNOSIS — R05 Cough: Secondary | ICD-10-CM | POA: Diagnosis not present

## 2018-04-21 DIAGNOSIS — B9789 Other viral agents as the cause of diseases classified elsewhere: Secondary | ICD-10-CM | POA: Diagnosis not present

## 2018-04-21 DIAGNOSIS — J069 Acute upper respiratory infection, unspecified: Secondary | ICD-10-CM | POA: Diagnosis not present

## 2018-04-21 DIAGNOSIS — E871 Hypo-osmolality and hyponatremia: Secondary | ICD-10-CM | POA: Diagnosis not present

## 2018-04-21 DIAGNOSIS — Z8711 Personal history of peptic ulcer disease: Secondary | ICD-10-CM | POA: Diagnosis not present

## 2018-04-21 DIAGNOSIS — R590 Localized enlarged lymph nodes: Secondary | ICD-10-CM | POA: Diagnosis not present

## 2018-04-21 DIAGNOSIS — R7989 Other specified abnormal findings of blood chemistry: Secondary | ICD-10-CM | POA: Diagnosis not present

## 2018-04-21 DIAGNOSIS — Z7982 Long term (current) use of aspirin: Secondary | ICD-10-CM | POA: Diagnosis not present

## 2018-04-21 DIAGNOSIS — K228 Other specified diseases of esophagus: Secondary | ICD-10-CM | POA: Diagnosis not present

## 2018-04-21 DIAGNOSIS — I2510 Atherosclerotic heart disease of native coronary artery without angina pectoris: Secondary | ICD-10-CM | POA: Diagnosis not present

## 2018-04-23 ENCOUNTER — Ambulatory Visit (INDEPENDENT_AMBULATORY_CARE_PROVIDER_SITE_OTHER): Payer: Medicare Other | Admitting: *Deleted

## 2018-04-23 DIAGNOSIS — I428 Other cardiomyopathies: Secondary | ICD-10-CM

## 2018-04-24 ENCOUNTER — Ambulatory Visit (INDEPENDENT_AMBULATORY_CARE_PROVIDER_SITE_OTHER): Payer: Medicare Other | Admitting: Family

## 2018-04-24 ENCOUNTER — Encounter: Payer: Self-pay | Admitting: Family

## 2018-04-24 VITALS — BP 127/67 | HR 62 | Temp 97.0°F | Ht 65.0 in | Wt 128.8 lb

## 2018-04-24 DIAGNOSIS — I25119 Atherosclerotic heart disease of native coronary artery with unspecified angina pectoris: Secondary | ICD-10-CM

## 2018-04-24 DIAGNOSIS — K219 Gastro-esophageal reflux disease without esophagitis: Secondary | ICD-10-CM

## 2018-04-24 DIAGNOSIS — I10 Essential (primary) hypertension: Secondary | ICD-10-CM | POA: Diagnosis not present

## 2018-04-24 DIAGNOSIS — K2289 Other specified disease of esophagus: Secondary | ICD-10-CM

## 2018-04-24 DIAGNOSIS — I472 Ventricular tachycardia, unspecified: Secondary | ICD-10-CM

## 2018-04-24 DIAGNOSIS — I4729 Other ventricular tachycardia: Secondary | ICD-10-CM

## 2018-04-24 DIAGNOSIS — E785 Hyperlipidemia, unspecified: Secondary | ICD-10-CM

## 2018-04-24 DIAGNOSIS — K228 Other specified diseases of esophagus: Secondary | ICD-10-CM

## 2018-04-24 DIAGNOSIS — Z23 Encounter for immunization: Secondary | ICD-10-CM

## 2018-04-24 DIAGNOSIS — F17200 Nicotine dependence, unspecified, uncomplicated: Secondary | ICD-10-CM

## 2018-04-24 DIAGNOSIS — I5022 Chronic systolic (congestive) heart failure: Secondary | ICD-10-CM | POA: Diagnosis not present

## 2018-04-24 DIAGNOSIS — R590 Localized enlarged lymph nodes: Secondary | ICD-10-CM

## 2018-04-24 DIAGNOSIS — Z79899 Other long term (current) drug therapy: Secondary | ICD-10-CM

## 2018-04-24 DIAGNOSIS — F1320 Sedative, hypnotic or anxiolytic dependence, uncomplicated: Secondary | ICD-10-CM

## 2018-04-24 DIAGNOSIS — F411 Generalized anxiety disorder: Secondary | ICD-10-CM

## 2018-04-24 DIAGNOSIS — Z09 Encounter for follow-up examination after completed treatment for conditions other than malignant neoplasm: Secondary | ICD-10-CM

## 2018-04-24 DIAGNOSIS — L409 Psoriasis, unspecified: Secondary | ICD-10-CM

## 2018-04-24 DIAGNOSIS — G4700 Insomnia, unspecified: Secondary | ICD-10-CM

## 2018-04-24 MED ORDER — ALPRAZOLAM 1 MG PO TABS: 1.0000 mg | ORAL_TABLET | Freq: Two times a day (BID) | ORAL | 5 refills | Status: DC | PRN

## 2018-04-24 NOTE — Progress Notes (Signed)
Subjective:    Patient ID: [name], male    DOB: 1941/03/07, 77 y.o.   MRN: [id number]  Chief Complaint  Patient presents with  . Medical Management of Chronic Issues   Pt presents to the office today for chronic follow up. PT is followed by Cardiologists for CAD, CHF, Atherosclerosis, Cardiomyopathy, and Ventricular tachycardia every 2 years. PT has pacemaker and is followed annually for this.   Pt is followed by Dermatologists forpsoriasis, but states he has been recently. Requesting to see another provider.  Pt went to ED on 04/21/18 for cough and had CT scan that showed :borderline enlarged mediastinal lymph noes, the larges measuring 11 mm in short axis, focal mucosal thickening of the proximal esophagus.". Recommended GI referral sine esophageal mass cannot be excluded.  Hypertension  This is a chronic problem. The current episode started more than 1 year ago. The problem has been resolved since onset. The problem is controlled. Associated symptoms include anxiety and malaise/fatigue. Pertinent negatives include no peripheral edema or shortness of breath. Risk factors for coronary artery disease include dyslipidemia, male gender, sedentary lifestyle and smoking/tobacco exposure. The current treatment provides moderate improvement. Hypertensive end-organ damage includes CAD/MI and heart failure.  Anxiety  Presents for follow-up visit. Symptoms include decreased concentration, depressed mood, excessive worry, irritability and nervous/anxious behavior. Patient reports no shortness of breath. Symptoms occur most days. The severity of symptoms is moderate.    Hyperlipidemia  This is a chronic problem. The current episode started more than 1 year ago. The problem is controlled. Recent lipid tests were reviewed and are normal. Pertinent negatives include no shortness of breath. Current antihyperlipidemic treatment includes statins. The current treatment provides moderate  improvement of lipids. Risk factors for coronary artery disease include hypertension, male sex, dyslipidemia and a sedentary lifestyle.  Gastroesophageal Reflux  He complains of heartburn. He reports no belching or no coughing. This is a chronic problem. The current episode started more than 1 year ago. The problem has been waxing and waning. The symptoms are aggravated by certain foods. He has tried a PPI for the symptoms.    [name] notes reviewed.   Review of Systems  Constitutional: Positive for irritability and malaise/fatigue.  Respiratory: Negative for cough and shortness of breath.   Gastrointestinal: Positive for heartburn.  Psychiatric/Behavioral: Positive for decreased concentration. The patient is nervous/anxious.   All other systems reviewed and are negative.      Objective:   Physical Exam  Constitutional: He is oriented to person, place, and time. He appears well-developed and well-nourished. No distress.  HENT:  Head: Normocephalic.  Right Ear: External ear normal.  Left Ear: External ear normal.  Mouth/Throat: Oropharynx is clear and moist.  Eyes: Pupils are equal, round, and reactive to light. Right eye exhibits no discharge. Left eye exhibits no discharge.  Neck: Normal range of motion. Neck supple. No thyromegaly present.  Cardiovascular: Normal rate, normal heart sounds and intact distal pulses. An irregular rhythm present.  No murmur heard. Pulmonary/Chest: Effort normal. No respiratory distress. He has decreased breath sounds. He has no wheezes.  Abdominal: Soft. Bowel sounds are normal. He exhibits no distension. There is no tenderness.  Musculoskeletal: Normal range of motion. He exhibits no edema or tenderness.  Neurological: He is alert and oriented to person, place, and time. He has normal reflexes. No cranial nerve deficit.  Skin: Skin is warm and dry. Rash noted. No erythema.  Psychiatric: He has a normal mood and affect. His behavior is  normal.  Judgment and thought content normal.  Vitals reviewed.     BP 127/67   Pulse 62   Temp (!) 97 F (36.1 C) (Oral)   Ht '5\' 5"'  (1.651 m)   Wt 128 lb 12.8 oz (58.4 kg)   BMI 21.43 kg/m      Assessment & Plan:  [name] comes in today with chief complaint of Medical Management of Chronic Issues   Diagnosis and orders addressed:  1. Atherosclerosis of native coronary artery with angina pectoris, unspecified whether native or transplanted heart ([place]) - CMP14+EGFR - CBC with Differential/Platelet  2. Chronic systolic heart failure (HCC) - CMP14+EGFR - CBC with Differential/Platelet  3. Essential hypertension - CMP14+EGFR - CBC with Differential/Platelet  4. Gastroesophageal reflux disease, esophagitis presence not specified - CMP14+EGFR - CBC with Differential/Platelet  5. Encounter for immunization - Flu vaccine HIGH DOSE PF  6. Hyperlipidemia, unspecified hyperlipidemia type - CMP14+EGFR - CBC with Differential/Platelet - Lipid panel  7. GAD (generalized anxiety disorder) - CMP14+EGFR - CBC with Differential/Platelet - ALPRAZolam (XANAX) 1 MG tablet; Take 1 tablet (1 mg total) by mouth 2 (two) times daily as needed.  Dispense: 60 tablet; Refill: 5  8. Insomnia, unspecified type - CMP14+EGFR - CBC with Differential/Platelet  9. Psoriasis - CMP14+EGFR - CBC with Differential/Platelet - Ambulatory referral to Dermatology  10. PAROXYSMAL VENTRICULAR TACHYCARDIA - CMP14+EGFR - CBC with Differential/Platelet  11. Benzodiazepine dependence ([place])  12. Controlled substance agreement signed  13. Hospital discharge follow-up  14. Esophageal thickening - Ambulatory referral to Gastroenterology  15. Mediastinal lymphadenopathy - Ambulatory referral to Gastroenterology  16. Current smoker - Ambulatory referral to Gastroenterology   Labs pending Health Maintenance reviewed Diet and exercise encouraged  Follow up plan: 6 months, follow up with  GI   Evelina Dun, FNP

## 2018-04-24 NOTE — Patient Instructions (Signed)
Psoriasis Psoriasis is a long-term (chronic) condition of skin inflammation. It occurs because your immune system causes skin cells to form too quickly. As a result, too many skin cells grow and create raised, red patches (plaques) that look silvery on your skin. Plaques may appear anywhere on your body. They can be any size or shape. Psoriasis can come and go. The condition varies from mild to very severe. It cannot be passed from one person to another (not contagious). What are the causes? The cause of psoriasis is not known, but certain factors can make the condition worse. These include:  Damage or trauma to the skin, such as cuts, scrapes, sunburn, and dryness.  Lack of sunlight.  Certain medicines.  Alcohol.  Tobacco use.  Stress.  Infections caused by bacteria or viruses.  What increases the risk? This condition is more likely to develop in:  People with a family history of psoriasis.  People who are Caucasian.  People who are between the ages of 15-30 and 50-60 years old.  What are the signs or symptoms? There are five different types of psoriasis. You can have more than one type of psoriasis during your life. Types are:  Plaque.  Guttate.  Inverse.  Pustular.  Erythrodermic.  Each type of psoriasis has different symptoms.  Plaque psoriasis symptoms include red, raised plaques with a silvery white coating (scale). These plaques may be itchy. Your nails may be pitted and crumbly or fall off.  Guttate psoriasis symptoms include small red spots that often show up on your trunk, arms, and legs. These spots may develop after you have been sick, especially with strep throat.  Inverse psoriasis symptoms include plaques in your underarm area, under your breasts, or on your genitals, groin, or buttocks.  Pustular psoriasis symptoms include pus-filled bumps that are painful, red, and swollen on the palms of your hands or the soles of your feet. You also may feel  exhausted, feverish, weak, or have no appetite.  Erythrodermic psoriasis symptoms include bright red skin that may look burned. You may have a fast heartbeat and a body temperature that is too high or too low. You may be itchy or in pain.  How is this diagnosed? Your health care provider may suspect psoriasis based on your symptoms and family history. Your health care provider will also do a physical exam. This may include a procedure to remove a tissue sample (biopsy) for testing. You may also be referred to a health care provider who specializes in skin diseases (dermatologist). How is this treated? There is no cure for this condition, but treatment can help manage it. Goals of treatment include:  Helping your skin heal.  Reducing itching and inflammation.  Slowing the growth of new skin cells.  Helping your immune system respond better to your skin.  Treatment varies, depending on the severity of your condition. Treatment may include:  Creams or ointments.  Ultraviolet ray exposure (light therapy). This may include natural sunlight or light therapy in a medical office.  Medicines (systemic therapy). These medicines can help your body better manage skin cell turnover and inflammation. They may be used along with light therapy or ointments. You may also get antibiotic medicines if you have an infection.  Follow these instructions at home: Skin Care  Moisturize your skin as needed. Only use moisturizers that have been approved by your health care provider.  Apply cool compresses to the affected areas.  Do not scratch your skin. Lifestyle   Do not   use tobacco products. This includes cigarettes, chewing tobacco, and e-cigarettes. If you need help quitting, ask your health care provider.  Drink little or no alcohol.  Try techniques for stress reduction, such as meditation or yoga.  Get exposure to the sun as told by your health care provider. Do not get sunburned.  Consider  joining a psoriasis support group. Medicines  Take or use over-the-counter and prescription medicines only as told by your health care provider.  If you were prescribed an antibiotic, take or use it as told by your health care provider. Do not stop taking the antibiotic even if your condition starts to improve. General instructions  Keep a journal to help track what triggers an outbreak. Try to avoid any triggers.  See a counselor or social worker if feelings of sadness, frustration, and hopelessness about your condition are interfering with your work and relationships.  Keep all follow-up visits as told by your health care provider. This is important. Contact a health care provider if:  Your pain gets worse.  You have increasing redness or warmth in the affected areas.  You have new or worsening pain or stiffness in your joints.  Your nails start to break easily or pull away from the nail bed.  You have a fever.  You feel depressed. This information is not intended to replace advice given to you by your health care provider. Make sure you discuss any questions you have with your health care provider. Document Released: 06/03/2000 Document Revised: 11/12/2015 Document Reviewed: 10/22/2014 Elsevier Interactive Patient Education  2018 Elsevier Inc.  

## 2018-04-25 LAB — CMP14+EGFR
A/G RATIO: 1.3 (ref 1.2–2.2)
ALBUMIN: 4 g/dL (ref 3.5–4.8)
ALT: 52 IU/L — ABNORMAL HIGH (ref 0–44)
AST: 41 IU/L — AB (ref 0–40)
Alkaline Phosphatase: 78 IU/L (ref 39–117)
BUN / CREAT RATIO: 14 (ref 10–24)
BUN: 13 mg/dL (ref 8–27)
Bilirubin Total: 0.3 mg/dL (ref 0.0–1.2)
CO2: 23 mmol/L (ref 20–29)
Calcium: 9.3 mg/dL (ref 8.6–10.2)
Chloride: 91 mmol/L — ABNORMAL LOW (ref 96–106)
Creatinine, Ser: 0.96 mg/dL (ref 0.76–1.27)
GFR calc Af Amer: 90 mL/min/{1.73_m2} (ref >59)
GFR calc non Af Amer: 78 mL/min/{1.73_m2} (ref >59)
GLOBULIN, TOTAL: 3 g/dL (ref 1.5–4.5)
Glucose: 104 mg/dL — ABNORMAL HIGH (ref 65–99)
POTASSIUM: 4.5 mmol/L (ref 3.5–5.2)
SODIUM: 130 mmol/L — AB (ref 134–144)
Total Protein: 7 g/dL (ref 6.0–8.5)

## 2018-04-25 LAB — CBC WITH DIFFERENTIAL/PLATELET
Basophils Absolute: 0.1 10*3/uL (ref 0.0–0.2)
Basos: 1 %
EOS (ABSOLUTE): 0.1 10*3/uL (ref 0.0–0.4)
Eos: 2 %
HEMATOCRIT: 34.2 % — AB (ref 37.5–51.0)
Hemoglobin: 11.6 g/dL — ABNORMAL LOW (ref 13.0–17.7)
Immature Grans (Abs): 0 10*3/uL (ref 0.0–0.1)
Immature Granulocytes: 0 %
LYMPHS ABS: 2.1 10*3/uL (ref 0.7–3.1)
Lymphs: 29 %
MCH: 30.5 pg (ref 26.6–33.0)
MCHC: 33.9 g/dL (ref 31.5–35.7)
MCV: 90 fL (ref 79–97)
MONOS ABS: 0.9 10*3/uL (ref 0.1–0.9)
Monocytes: 12 %
Neutrophils Absolute: 4 10*3/uL (ref 1.4–7.0)
Neutrophils: 56 %
PLATELETS: 325 10*3/uL (ref 150–450)
RBC: 3.8 x10E6/uL — ABNORMAL LOW (ref 4.14–5.80)
RDW: 13.2 % (ref 12.3–15.4)
WBC: 7.1 10*3/uL (ref 3.4–10.8)

## 2018-04-25 LAB — LIPID PANEL
CHOL/HDL RATIO: 2.6 ratio (ref 0.0–5.0)
Cholesterol, Total: 162 mg/dL (ref 100–199)
HDL: 63 mg/dL (ref >39)
LDL Calculated: 82 mg/dL (ref 0–99)
Triglycerides: 84 mg/dL (ref 0–149)
VLDL Cholesterol Cal: 17 mg/dL (ref 5–40)

## 2018-04-25 NOTE — Progress Notes (Signed)
Remote ICD transmission.   

## 2018-04-26 ENCOUNTER — Encounter: Payer: Self-pay | Admitting: Cardiology

## 2018-04-26 ENCOUNTER — Other Ambulatory Visit: Payer: Self-pay

## 2018-04-26 ENCOUNTER — Other Ambulatory Visit: Payer: Self-pay | Admitting: Family

## 2018-04-26 NOTE — Patient Outreach (Signed)
[facility]) Care Management  04/26/2018  FLAVIO LINDROTH 1940-07-03 [id number]   Referral Date: 04/25/18 Referral Source: [facility] referral Referral Reason: Medication assistance   Outreach Attempt: Spoke with patient.  He is able to verify HIPAA.  Discussed reason for referral.  He states that he has not been using his psoriasis medication because he cannot afford it. He states that the medication Acitretin cost $90 per month.    Patient reports seeing PCP recently and referral was made to a dermatologist.  Patient states his feet are sore because he does not have the medication to use.  He reports having some heart problems, psoriasis, HTN, internal defibrillator, and pacemaker.  Patient reports that he manages ok and is independent with care but does not drive.  He states he has a friend that lives with him.  Patient also has support of daughter.    Discussed [facility] services.  Patient agreeable to pharmacy only at this time.    Plan: RN CM will refer to pharmacy for medication assistance.   Jone Baseman, RN, MSN [facility] Care Management Care Management Coordinator Direct Line [phone] Toll Free: [phone]  Fax: [phone]

## 2018-04-27 ENCOUNTER — Other Ambulatory Visit: Payer: Self-pay

## 2018-04-27 NOTE — Patient Outreach (Signed)
  [facility]) Care Management  [place]   04/27/2018  LEKEITH WULF 10-26-1940 [id number]   Reason for referral: medication assistance  Referral medication: Acitretin (Sor[place]ane) Current insurance:[employer]  PMHx:  Coronary atherosclerosis, systloic heart failure, hypertension, irritable bowel syndrome, GERD, psoriasis and hyperlipidemia  HPI: Mr. Levene reports that he cannot afford the $90 copay for his Acitretin.  He states that he is out of this medication and his psoriasis is getting worse.  Patient reports that his PCP is going to refer him to a new dermatologist, but he doesn't have an appointment yet.    Objective: Allergies  Allergen Reactions  . Sudafed [Pseudoephedrine Hcl] Other (See Comments)    Unknown  . Prednisone Other (See Comments)    Depression and "personality changes".  . Avelox [Moxifloxacin Hcl In Nacl] Other (See Comments)    Unknown    Patient is unable to review his medications with me because "my daughter handles all that."  Medication Assistance: Patient states he prefers to wait until he sees his new dermatologist to see if he prescribes a different medication for his psoriasis.  He does not want to proceed with Acitretin assistance at this time.  Informed him that I will close his [place].  Requested that he keep my phone number and call me if he needs medication assistance in the future.  Plan: Route case closure letter to PCP, Evelina Dun, FNP.  Joetta Manners, PharmD Clinical Pharmacist [place] [phone]

## 2018-05-03 ENCOUNTER — Encounter: Payer: Self-pay | Admitting: Internal Medicine

## 2018-05-21 DIAGNOSIS — L409 Psoriasis, unspecified: Secondary | ICD-10-CM | POA: Diagnosis not present

## 2018-06-04 ENCOUNTER — Other Ambulatory Visit: Payer: Self-pay | Admitting: Family

## 2018-06-05 ENCOUNTER — Encounter: Payer: Self-pay | Admitting: Internal Medicine

## 2018-06-05 ENCOUNTER — Ambulatory Visit: Payer: Medicare Other | Admitting: Internal Medicine

## 2018-06-05 VITALS — BP 138/82 | HR 75 | Ht 68.0 in | Wt 130.2 lb

## 2018-06-05 DIAGNOSIS — R933 Abnormal findings on diagnostic imaging of other parts of digestive tract: Secondary | ICD-10-CM | POA: Diagnosis not present

## 2018-06-05 DIAGNOSIS — K219 Gastro-esophageal reflux disease without esophagitis: Secondary | ICD-10-CM | POA: Diagnosis not present

## 2018-06-05 NOTE — Patient Instructions (Addendum)
  You have been scheduled for an endoscopy. Please follow written instructions given to you at your visit today. If you use inhalers (even only as needed), please bring them with you on the day of your procedure.   I appreciate the opportunity to care for you. Carl Gessner, MD, FACG 

## 2018-06-05 NOTE — Progress Notes (Signed)
ARREN LAMINACK 77 y.o. 02/09/1941 [id number]  Assessment & Plan:   Encounter Diagnoses  Name Primary?  . Abnormal CT scan, esophagus Yes  . Gastroesophageal reflux disease, esophagitis presence not specified    Schedule EGD to evaluate this abnormality and ensure that it is not anything sinister which is doubtful but need to be sure.  He has an AICD/pacemaker but his EF is normal on most recent echo of 2015.  He should be okay for the [place].  The risks and benefits as well as alternatives of endoscopic procedure(s) have been discussed and reviewed. All questions answered. The patient agrees to proceed.  Note he had a kidney lesion in the right kidney on the CT scan, exophytic off the pole of the kidney on the right 11 mm he had a 10 mm lesion back in 2005 so I suspect this is the same and stable but I told him and his daughter that I would notify PCP about this as well.  I appreciate the opportunity to care for this patient. CC: Sharion Balloon, FNP   Subjective:   Chief Complaint: Abnormal esophagus on CT scan  HPI This 77 year old white man is here with his daughter, after he had a CT Angio of the chest at [facility] (transient hemoptysis) that was negative for any pulmonary embolism but did show thickening of the proximal esophagus and some borderline mediastinal adenopathy.  He takes omeprazole twice daily, most days it sounds like and does not have any heartburn or dysphagia. Allergies  Allergen Reactions  . Sudafed [Pseudoephedrine Hcl] Other (See Comments)    Unknown  . Prednisone Other (See Comments)    Depression and "personality changes".  . Avelox [Moxifloxacin Hcl In Nacl] Other (See Comments)    Unknown    Current Meds  Medication Sig  . ALPRAZolam (XANAX) 1 MG tablet Take 1 tablet (1 mg total) by mouth 2 (two) times daily as needed.  Marland Kitchen amLODipine (NORVASC) 10 MG tablet TAKE 1 TABLET BY MOUTH ONCE DAILY  . aspirin 81 MG tablet Take 81 mg by  mouth daily.  . cetirizine (ZYRTEC) 10 MG tablet Take 10 mg by mouth daily as needed for allergies.  Marland Kitchen escitalopram (LEXAPRO) 10 MG tablet Take 1 tablet (10 mg total) by mouth daily.  Marland Kitchen lisinopril (PRINIVIL,ZESTRIL) 40 MG tablet TAKE 1 TABLET BY MOUTH ONCE DAILY  . metoprolol tartrate (LOPRESSOR) 100 MG tablet TAKE 1 TABLET BY MOUTH TWICE DAILY FOR HIGH BLOOD PRESSURE  . omeprazole (PRILOSEC) 20 MG capsule TAKE 1 CAPSULE BY MOUTH TWICE DAILY FOR ACID REFLUX  . pravastatin (PRAVACHOL) 40 MG tablet TAKE 1 TABLET BY MOUTH ONCE DAILY WITH BREAKFAST (NEEDS TO BE SEEN FOR REFILLS)   Past Medical History:  Diagnosis Date  . Arthritis   . CAD (coronary artery disease)    Nonobstructive cath 2012.  PCI to RCA 2003  . Carotid artery disease ([place])   . COPD (chronic obstructive pulmonary disease) ([place])   . Depression   . Dyslipidemia   . EtOH dependence ([place])   . Hyperlipidemia   . Hypertension   . Insomnia   . Nonischemic cardiomyopathy (HCC)    EF was 20% (60% last echo 2015)  . Pacemaker    and defibrillator  . Peptic ulcer disease   . Tobacco abuse    Past Surgical History:  Procedure Laterality Date  . BIV ICD GENERTAOR CHANGE OUT N/A 03/06/2014   [employer] Gen change by Dr Rayann Heman Beckie Salts CRT-D  with LV1 header)  . biventricular defibrillator implantation     [employer] and pacemaker  . CATARACT EXTRACTION W/PHACO Right 12/10/2015   Procedure: CATARACT EXTRACTION PHACO AND INTRAOCULAR LENS PLACEMENT RIGHT EYE; CDE: 19.30;  Surgeon: Tonny Branch, MD;  Location: [facility];  Service: Ophthalmology;  Laterality: Right;  . CATARACT EXTRACTION W/PHACO Left 01/14/2016   Procedure: CATARACT EXTRACTION PHACO AND INTRAOCULAR LENS PLACEMENT LEFT EYE; CDE:  8.03;  Surgeon: Tonny Branch, MD;  Location: [facility];  Service: Ophthalmology;  Laterality: Left;  . 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   Review of Systems Allergies and sinus problems some anxious mood at times and arthritis symptoms.  All other review of systems are negative.  Objective:   Physical Exam @BP  138/82   Pulse 75   Ht 5\' 8"  (1.727 m)   Wt 130 lb 3.2 oz (59.1 kg)   BMI 19.80 kg/m @  General:  Well-developed, well-nourished and in no acute distress Lungs: Clear to auscultation bilaterally.  Few slight decreased breath sounds Chest wall: There is an AICD implanted in the left upper subclavicular area Heart:   S1S2, no rubs, murmurs, gallops. Abdomen:  soft, non-tender, no hepatosplenomegaly, hernia, or mass and BS+.  Neuro:  A&O x 3.  Psych:  appropriate mood and  Affect.   Data Reviewed: See above

## 2018-06-11 ENCOUNTER — Other Ambulatory Visit: Payer: Self-pay | Admitting: Family

## 2018-06-11 DIAGNOSIS — N2889 Other specified disorders of kidney and ureter: Secondary | ICD-10-CM

## 2018-06-19 ENCOUNTER — Ambulatory Visit (AMBULATORY_SURGERY_CENTER): Payer: Medicare Other | Admitting: Internal Medicine

## 2018-06-19 ENCOUNTER — Encounter: Payer: Self-pay | Admitting: Internal Medicine

## 2018-06-19 ENCOUNTER — Other Ambulatory Visit: Payer: Self-pay

## 2018-06-19 ENCOUNTER — Other Ambulatory Visit: Payer: Self-pay | Admitting: Family

## 2018-06-19 VITALS — BP 156/73 | HR 60 | Temp 96.6°F | Resp 10 | Ht 68.0 in | Wt 130.0 lb

## 2018-06-19 DIAGNOSIS — K219 Gastro-esophageal reflux disease without esophagitis: Secondary | ICD-10-CM | POA: Diagnosis not present

## 2018-06-19 DIAGNOSIS — R933 Abnormal findings on diagnostic imaging of other parts of digestive tract: Secondary | ICD-10-CM

## 2018-06-19 DIAGNOSIS — K222 Esophageal obstruction: Secondary | ICD-10-CM | POA: Diagnosis not present

## 2018-06-19 DIAGNOSIS — J449 Chronic obstructive pulmonary disease, unspecified: Secondary | ICD-10-CM | POA: Diagnosis not present

## 2018-06-19 MED ORDER — SODIUM CHLORIDE 0.9 % IV SOLN: 500.0000 mL | Freq: Once | INTRAVENOUS | Status: DC

## 2018-06-19 NOTE — Patient Instructions (Addendum)
   No significant problem seen here. There is a slight narrowing called a ring. It is not causing problems (you swallow ok).  I appreciate the opportunity to care for you. Gatha Mayer, MD, FACG  YOU HAD AN ENDOSCOPIC PROCEDURE TODAY AT [facility]:   Refer to the procedure report that was given to you for any specific questions about what was found during the examination.  If the procedure report does not answer your questions, please call your gastroenterologist to clarify.  If you requested that your care partner not be given the details of your procedure findings, then the procedure report has been included in a sealed envelope for you to review at your convenience later.  YOU SHOULD EXPECT: Some feelings of bloating in the abdomen. Passage of more gas than usual.  Walking can help get rid of the air that was put into your GI tract during the procedure and reduce the bloating. If you had a lower endoscopy (such as a colonoscopy or flexible sigmoidoscopy) you may notice spotting of blood in your stool or on the toilet paper. If you underwent a bowel prep for your procedure, you may not have a normal bowel movement for a few days.  Please Note:  You might notice some irritation and congestion in your nose or some drainage.  This is from the oxygen used during your procedure.  There is no need for concern and it should clear up in a day or so.  SYMPTOMS TO REPORT IMMEDIATELY:    Following upper endoscopy (EGD)  Vomiting of blood or coffee ground material  New chest pain or pain under the shoulder blades  Painful or persistently difficult swallowing  New shortness of breath  Fever of 100F or higher  Black, tarry-looking stools  For urgent or emergent issues, a gastroenterologist can be reached at any hour by calling [phone].   DIET:  We do recommend a small meal at first, but then you may proceed to your regular diet.  Drink plenty of fluids but you should  avoid alcoholic beverages for 24 hours.  ACTIVITY:  You should plan to take it easy for the rest of today and you should NOT DRIVE or use heavy machinery until tomorrow (because of the sedation medicines used during the test).    FOLLOW UP: Our staff will call the number listed on your records the next business day following your procedure to check on you and address any questions or concerns that you may have regarding the information given to you following your procedure. If we do not reach you, we will leave a message.  However, if you are feeling well and you are not experiencing any problems, there is no need to return our call.  We will assume that you have returned to your regular daily activities without incident.  If any biopsies were taken you will be contacted by phone or by letter within the next 1-3 weeks.  Please call us at [phone] if you have not heard about the biopsies in 3 weeks.    SIGNATURES/CONFIDENTIALITY: You and/or your care partner have signed paperwork which will be entered into your electronic medical record.  These signatures attest to the fact that that the information above on your After Visit Summary has been reviewed and is understood.  Full responsibility of the confidentiality of this discharge information lies with you and/or your care-partner.

## 2018-06-19 NOTE — Op Note (Signed)
[facility] Patient Name: [name] Procedure Date: 06/19/2018 7:30 AM MRN: [id number] Endoscopist: Gatha Mayer , MD Age: 77 Referring MD:  Date of Birth: 07-08-1940 Gender: Male Account #: [id number] Procedure:                Upper GI endoscopy Indications:              Abnormal CT of the GI tract - thickened esophagus                            (distal) Medicines:                Propofol per Anesthesia, Monitored Anesthesia Care Procedure:                Pre-Anesthesia Assessment:                           - Prior to the procedure, a History and Physical                            was performed, and patient medications and                            allergies were reviewed. The patient's tolerance of                            previous anesthesia was also reviewed. The risks                            and benefits of the procedure and the sedation                            options and risks were discussed with the patient.                            All questions were answered, and informed consent                            was obtained. Prior Anticoagulants: The patient has                            taken no previous anticoagulant or antiplatelet                            agents. ASA Grade Assessment: III - A patient with                            severe systemic disease. After reviewing the risks                            and benefits, the patient was deemed in                            satisfactory condition to undergo the procedure.  After obtaining informed consent, the endoscope was                            passed under direct vision. Throughout the                            procedure, the patient's blood pressure, pulse, and                            oxygen saturations were monitored continuously. The                            Model GIF-[id number]) scope was introduced                            through the  mouth, and advanced to the second part                            of duodenum. The upper GI endoscopy was                            accomplished without difficulty. The patient                            tolerated the procedure well. Scope In: Scope Out: Findings:                 A non-obstructing Schatzki ring was found at the                            gastroesophageal junction.                           The exam was otherwise without abnormality.                           The cardia and gastric fundus were normal on                            retroflexion. Complications:            No immediate complications. Estimated Blood Loss:     Estimated blood loss: none. Impression:               - Non-obstructing Schatzki ring.                           - The examination was otherwise normal. NO OTHER                            ABNORMALITY SEEN                           - No specimens collected. Recommendation:           - Patient has a contact number available for  emergencies. The signs and symptoms of potential                            delayed complications were discussed with the                            patient. Return to normal activities tomorrow.                            Written discharge instructions were provided to the                            patient.                           - Resume previous diet.                           - Continue present medications.                           NO FURTHER EVALUATION NEEDED Gatha Mayer, MD 06/19/2018 7:50:25 AM This report has been signed electronically.

## 2018-06-19 NOTE — Progress Notes (Signed)
Report to PACU, RN, vss, BBS= Clear.  

## 2018-06-21 ENCOUNTER — Telehealth: Payer: Self-pay | Admitting: *Deleted

## 2018-06-21 NOTE — Telephone Encounter (Signed)
Second follow up call attempt.  No answer or voicemail option 

## 2018-06-21 NOTE — Telephone Encounter (Signed)
First follow up call attempt.  No answer or voicemail.

## 2018-06-24 LAB — CUP PACEART REMOTE DEVICE CHECK
Date Time Interrogation Session: 20200105150331
Implantable Lead Implant Date: 20040312
Implantable Lead Implant Date: 20040312
Implantable Lead Implant Date: 20040312
Implantable Lead Location: 753858
Implantable Lead Location: 753859
Implantable Lead Location: 753860
Implantable Lead Model: 158
Implantable Lead Model: 4087
Implantable Lead Model: 4513
Implantable Lead Serial Number: 129158
Implantable Lead Serial Number: 209446
Implantable Lead Serial Number: 407264
Implantable Pulse Generator Implant Date: 20150917
Pulse Gen Serial Number: 106298

## 2018-06-30 ENCOUNTER — Other Ambulatory Visit: Payer: Self-pay | Admitting: Family

## 2018-06-30 DIAGNOSIS — I10 Essential (primary) hypertension: Secondary | ICD-10-CM

## 2018-07-03 IMAGING — DX DG CHEST 2V
2 series · 2 of 2 positions shown · non-contrast
Comparison: 09/17/2013 .

CLINICAL DATA: Cough and congestion.

EXAM:
CHEST  2 VIEW

[chest pa]
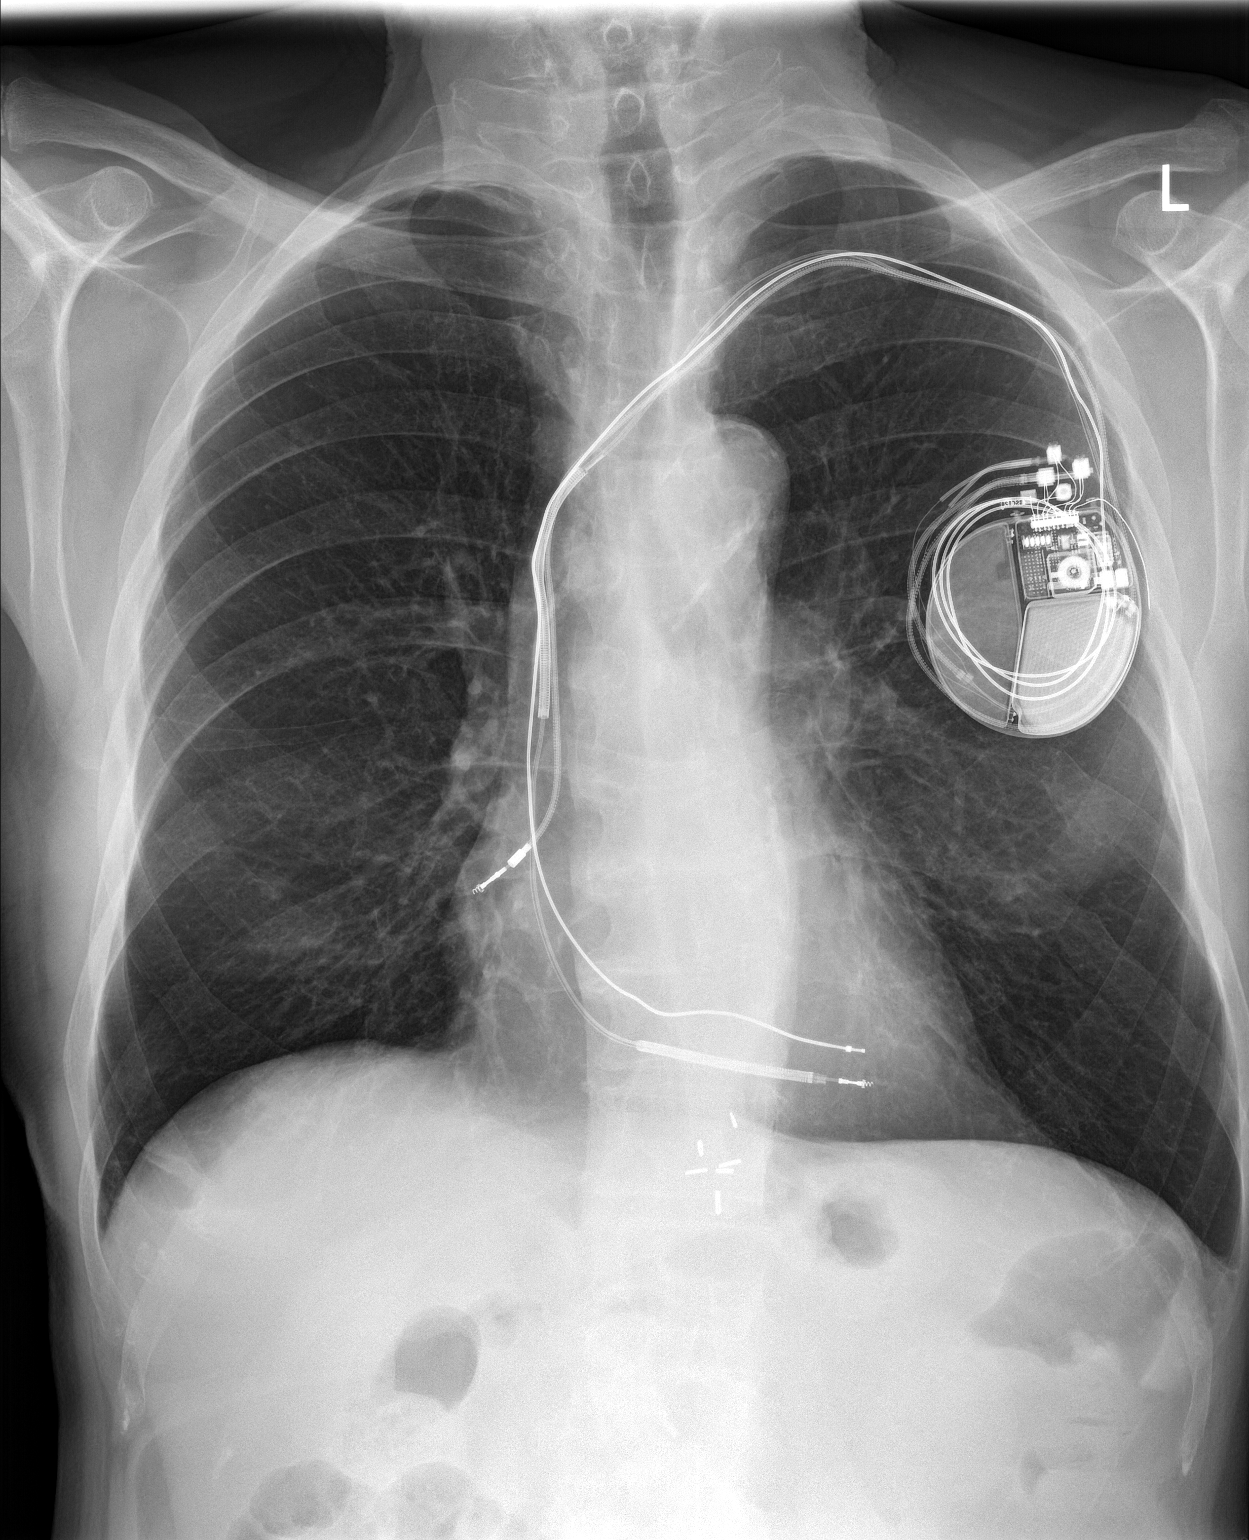

[chest lat]
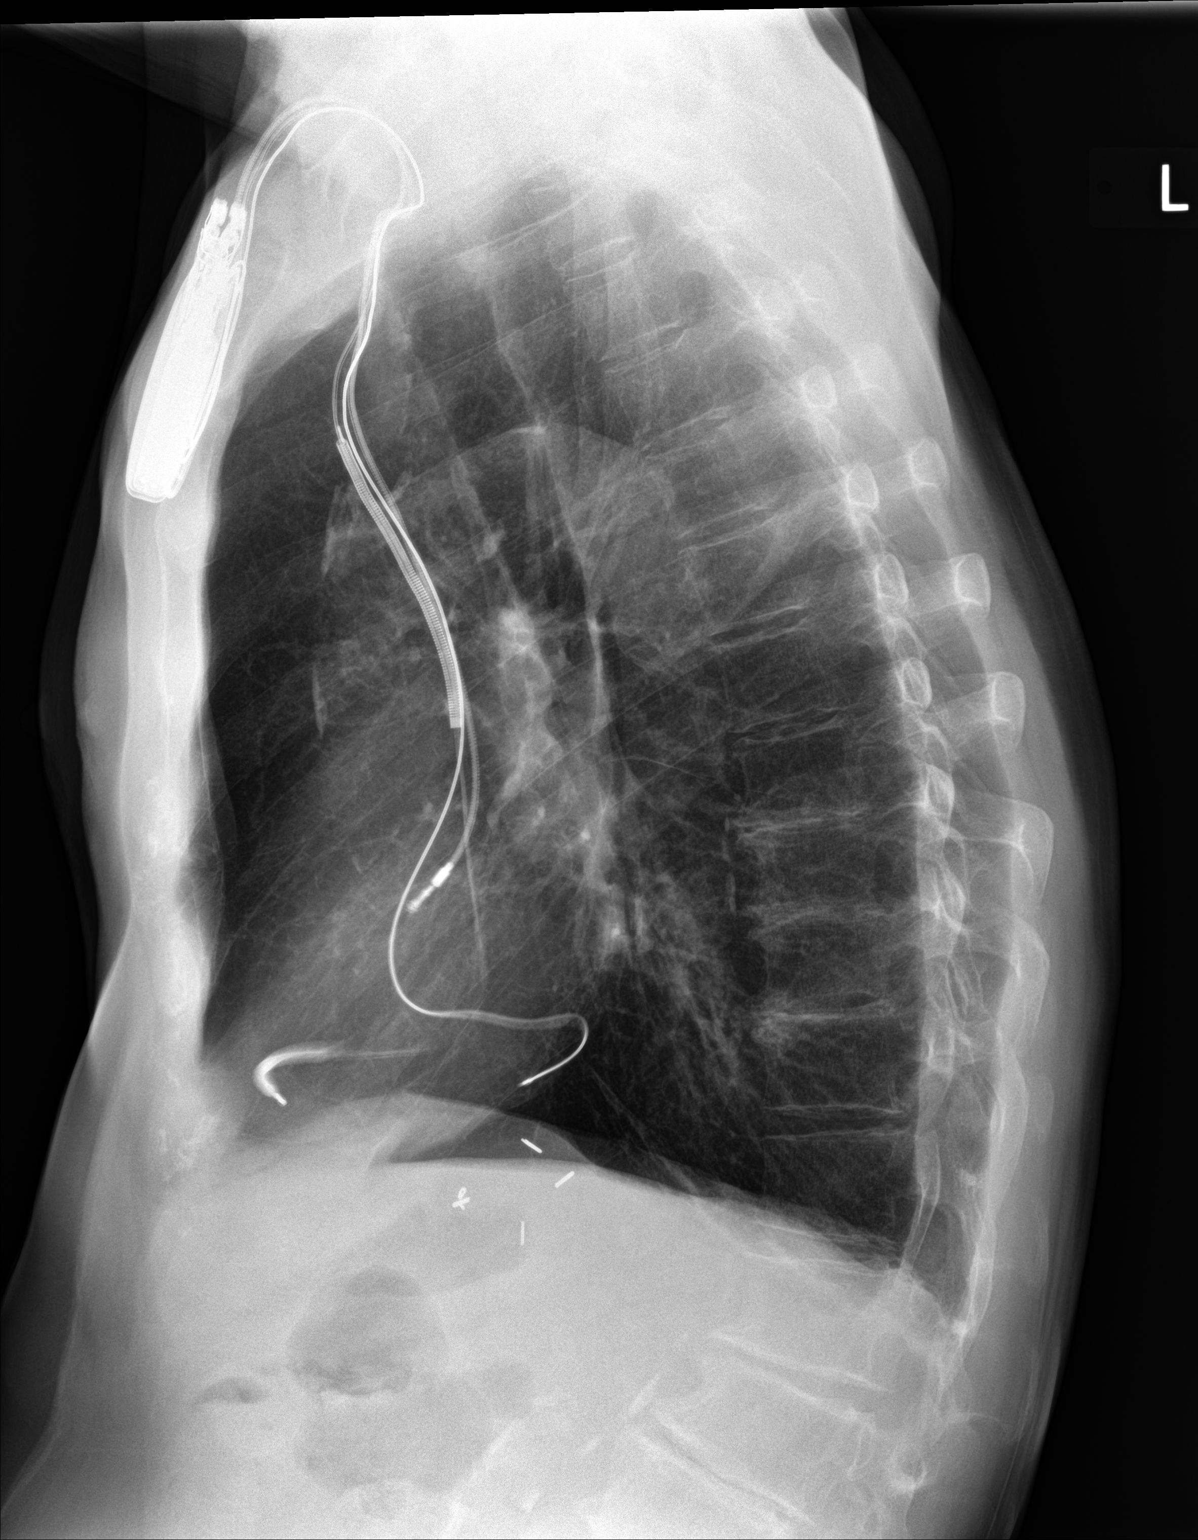

[2 of 2 positions shown; findings below may reference images not displayed]

FINDINGS: Cardiac pacer noted in stable position. Heart size normal. No focal
infiltrate. No pleural effusion or pneumothorax. Degenerative
changes thoracic spine.
IMPRESSION: Cardiac pacer noted stable position.  No acute pulmonary disease.

## 2018-07-23 ENCOUNTER — Ambulatory Visit (INDEPENDENT_AMBULATORY_CARE_PROVIDER_SITE_OTHER): Payer: Medicare Other

## 2018-07-23 DIAGNOSIS — I5022 Chronic systolic (congestive) heart failure: Secondary | ICD-10-CM

## 2018-07-23 DIAGNOSIS — I428 Other cardiomyopathies: Secondary | ICD-10-CM

## 2018-07-26 LAB — CUP PACEART REMOTE DEVICE CHECK
Battery Remaining Longevity: 66 mo
Battery Remaining Percentage: 100 %
Brady Statistic RA Percent Paced: 0 %
Brady Statistic RV Percent Paced: 100 %
Date Time Interrogation Session: 20200203053100
HighPow Impedance: 57 Ohm
Implantable Lead Implant Date: 20040312
Implantable Lead Implant Date: 20040312
Implantable Lead Implant Date: 20040312
Implantable Lead Location: 753858
Implantable Lead Location: 753859
Implantable Lead Location: 753860
Implantable Lead Model: 158
Implantable Lead Model: 4087
Implantable Lead Serial Number: 129158
Implantable Lead Serial Number: 209446
Implantable Lead Serial Number: 407264
Implantable Pulse Generator Implant Date: 20150917
Lead Channel Impedance Value: 590 Ohm
Lead Channel Impedance Value: 849 Ohm
Lead Channel Pacing Threshold Amplitude: 1 V
Lead Channel Pacing Threshold Amplitude: 2.2 V
Lead Channel Pacing Threshold Pulse Width: 0.4 ms
Lead Channel Pacing Threshold Pulse Width: 0.4 ms
Lead Channel Pacing Threshold Pulse Width: 0.8 ms
Lead Channel Setting Pacing Amplitude: 2 V
Lead Channel Setting Pacing Amplitude: 3 V
Lead Channel Setting Pacing Pulse Width: 0.4 ms
Lead Channel Setting Pacing Pulse Width: 0.8 ms
Lead Channel Setting Sensing Sensitivity: 0.6 mV
Lead Channel Setting Sensing Sensitivity: 1 mV
MDC IDC MSMT LEADCHNL RA IMPEDANCE VALUE: 589 Ohm
MDC IDC MSMT LEADCHNL RV PACING THRESHOLD AMPLITUDE: 0.8 V
MDC IDC SET LEADCHNL RV PACING AMPLITUDE: 2.5 V
Pulse Gen Serial Number: 106298

## 2018-07-30 IMAGING — DX DG CHEST 2V
2 series · 2 of 2 positions shown · non-contrast
Comparison: PA and lateral chest x-ray June 30, 2016

CLINICAL DATA: Follow-up of pneumonia. Reason community-acquired
pneumonia. History of coronary artery disease with stent placement,
chronic CHF, current smoker.

EXAM:
CHEST  2 VIEW

[chest pa]
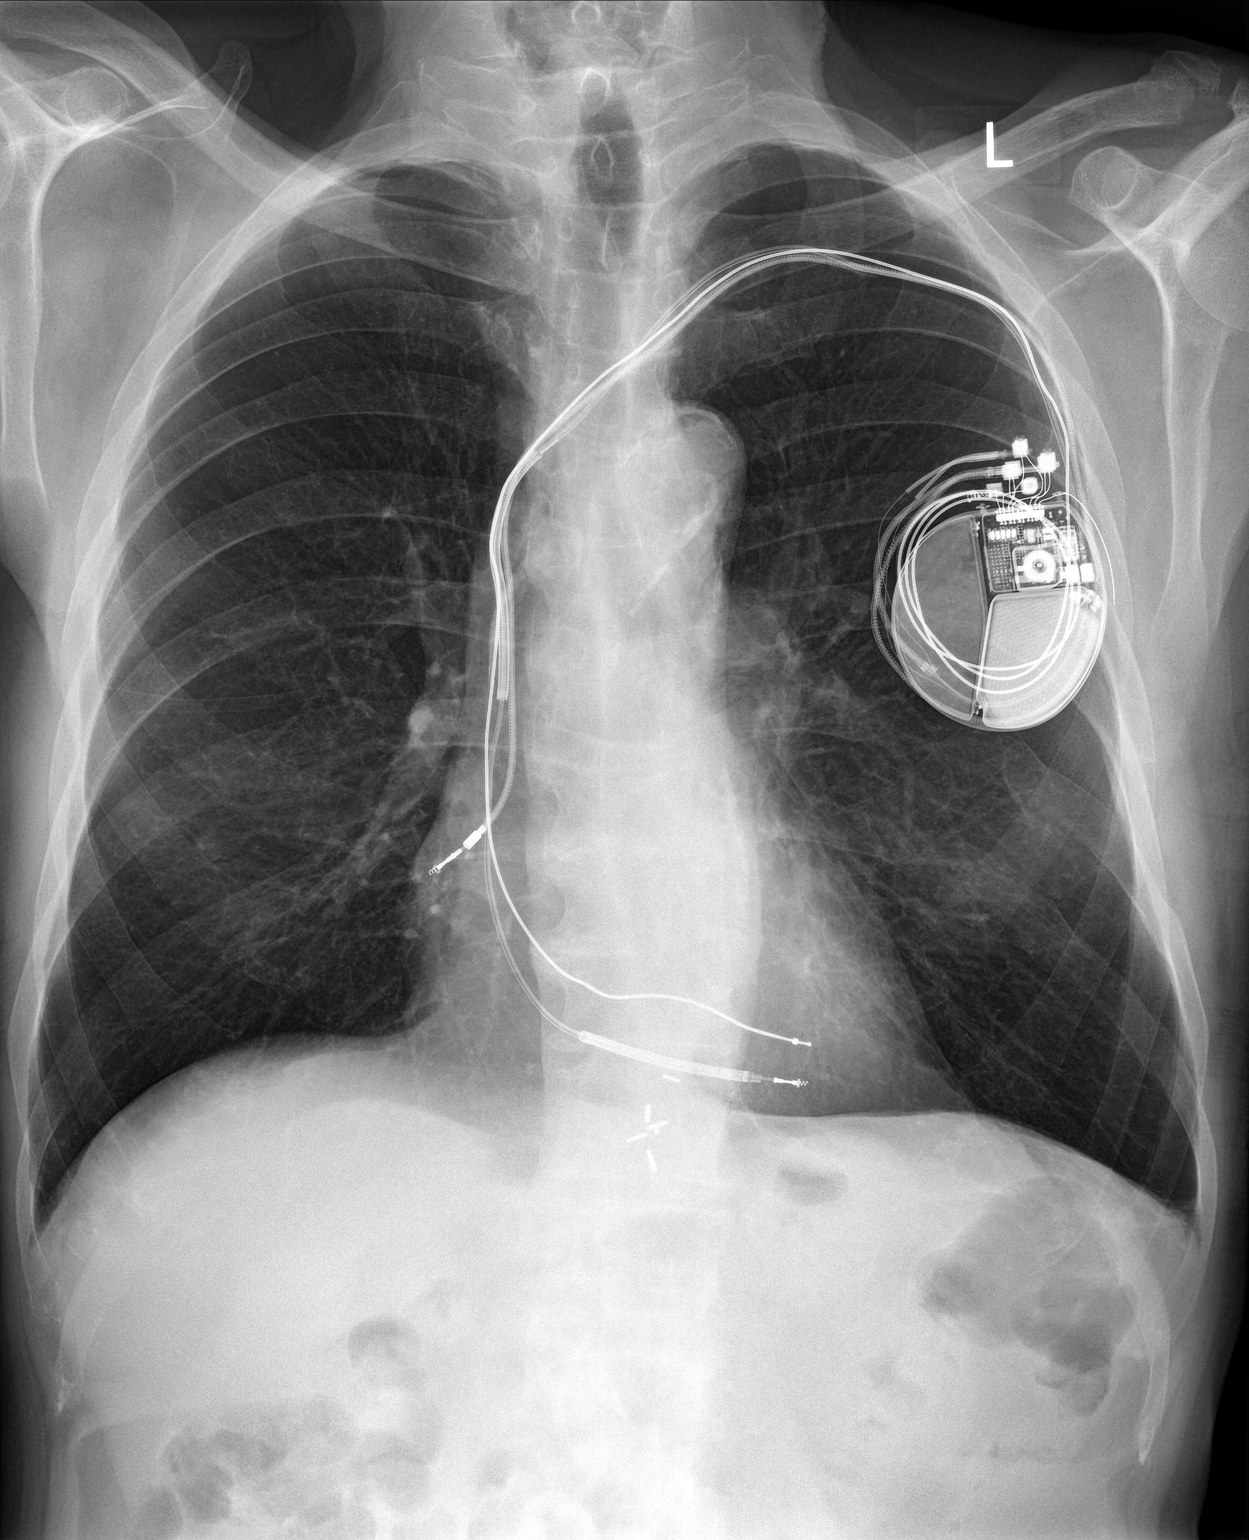

[chest lat]
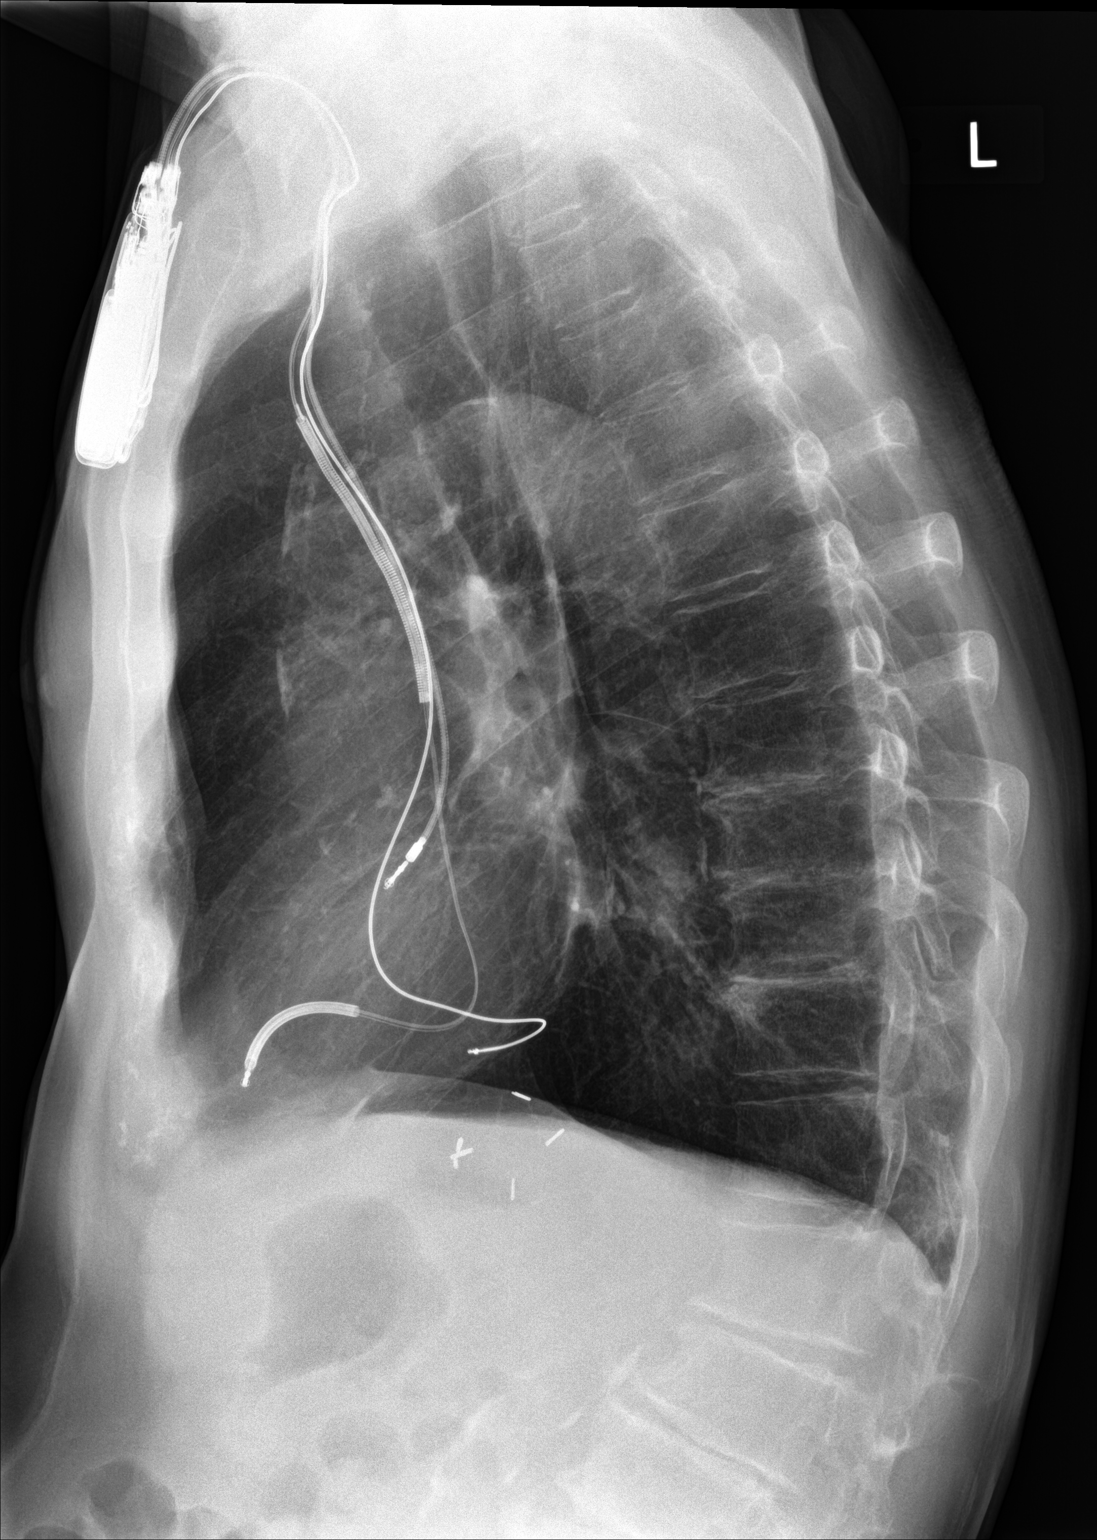

[2 of 2 positions shown; findings below may reference images not displayed]

FINDINGS: The lungs are hyperinflated with hemidiaphragm flattening. There is
no focal infiltrate. There is no pleural effusion. The heart and
pulmonary vascularity are normal. The ICD is in stable position.
There is dense calcification in the wall of the thoracic aorta.
There is mild multilevel degenerative disc disease centered in the
lower thoracic spine.
IMPRESSION: COPD. No pneumonia, CHF, nor other acute cardiopulmonary
abnormality.

Thoracic aortic atherosclerosis.

## 2018-08-01 NOTE — Progress Notes (Signed)
Remote ICD transmission.   

## 2018-08-24 ENCOUNTER — Encounter: Payer: Self-pay | Admitting: Internal Medicine

## 2018-08-24 ENCOUNTER — Ambulatory Visit: Payer: Medicare Other | Admitting: Internal Medicine

## 2018-08-24 VITALS — BP 168/78 | HR 60 | Ht 68.0 in | Wt 128.0 lb

## 2018-08-24 DIAGNOSIS — I5022 Chronic systolic (congestive) heart failure: Secondary | ICD-10-CM

## 2018-08-24 DIAGNOSIS — I10 Essential (primary) hypertension: Secondary | ICD-10-CM | POA: Diagnosis not present

## 2018-08-24 LAB — CUP PACEART INCLINIC DEVICE CHECK
Date Time Interrogation Session: 20200306113016
Implantable Lead Implant Date: 20040312
Implantable Lead Implant Date: 20040312
Implantable Lead Implant Date: 20040312
Implantable Lead Location: 753858
Implantable Lead Location: 753859
Implantable Lead Location: 753860
Implantable Lead Model: 158
Implantable Lead Model: 4087
Implantable Lead Model: 4513
Implantable Lead Serial Number: 407264
Implantable Pulse Generator Implant Date: 20150917
MDC IDC LEAD SERIAL: 129158
MDC IDC LEAD SERIAL: 209446
Pulse Gen Serial Number: 106298

## 2018-08-24 NOTE — Patient Instructions (Signed)
Medication Instructions:   Your physician recommends that you continue on your current medications as directed. Please refer to the Current Medication list given to you today.  Labwork:  NONE  Testing/Procedures:  NONE  Follow-Up: Your physician recommends that you schedule a follow-up appointment in: 1 year. Please schedule this appointment today before leaving the office.  Any Other Special Instructions Will Be Listed Below (If Applicable).  If you need a refill on your cardiac medications before your next appointment, please call your pharmacy.

## 2018-08-24 NOTE — Progress Notes (Signed)
PCP: Sharion Balloon, FNP Primary Cardiologist: Dr Percival Spanish Primary EP: Dr Theressa Millard is a 78 y.o. male who presents today for routine electrophysiology followup.  Since last being seen in our clinic, the patient reports doing very well.  Today, he denies symptoms of palpitations, chest pain, shortness of breath,  lower extremity edema, dizziness, presyncope, syncope, or ICD shocks.  The patient is otherwise without complaint today.   Past Medical History:  Diagnosis Date  . Anxiety   . Arthritis   . CAD (coronary artery disease)    Nonobstructive cath 2012.  PCI to RCA 2003  . Carotid artery disease ([place])   . Cataract   . CHF (congestive heart failure) ([place])   . COPD (chronic obstructive pulmonary disease) ([place])   . Depression   . Dyslipidemia   . EtOH dependence ([place])   . GERD (gastroesophageal reflux disease)   . Hyperlipidemia   . Hypertension   . Insomnia   . Nonischemic cardiomyopathy (HCC)    EF was 20% (60% last echo 2015)  . Pacemaker    and defibrillator  . Peptic ulcer disease   . Tobacco abuse    Past Surgical History:  Procedure Laterality Date  . BIV ICD GENERTAOR CHANGE OUT N/A 03/06/2014   [employer] Gen change by Dr Rayann Heman Beckie Salts CRT-D with LV1 header)  . biventricular defibrillator implantation     [employer] and pacemaker  . CATARACT EXTRACTION W/PHACO Right 12/10/2015   Procedure: CATARACT EXTRACTION PHACO AND INTRAOCULAR LENS PLACEMENT RIGHT EYE; CDE: 19.30;  Surgeon: Tonny Branch, MD;  Location: [facility];  Service: Ophthalmology;  Laterality: Right;  . CATARACT EXTRACTION W/PHACO Left 01/14/2016   Procedure: CATARACT EXTRACTION PHACO AND INTRAOCULAR LENS PLACEMENT LEFT EYE; CDE:  8.03;  Surgeon: Tonny Branch, MD;  Location: [facility];  Service: Ophthalmology;  Laterality: Left;  . COLONOSCOPY    . EYE SURGERY    . Ulcer surgery     repair of stomach ulcer    ROS- all systems are reviewed and negative except as per HPI  above  Current Outpatient Medications  Medication Sig Dispense Refill  . acitretin (SORIATANE) 25 MG capsule Take 25 mg by mouth daily.    Marland Kitchen ALPRAZolam (XANAX) 1 MG tablet Take 1 tablet (1 mg total) by mouth 2 (two) times daily as needed. 60 tablet 5  . amLODipine (NORVASC) 10 MG tablet TAKE 1 TABLET BY MOUTH ONCE DAILY 90 tablet 0  . aspirin 81 MG tablet Take 81 mg by mouth daily.    . cetirizine (ZYRTEC) 10 MG tablet Take 10 mg by mouth daily as needed for allergies.    Marland Kitchen lisinopril (PRINIVIL,ZESTRIL) 40 MG tablet TAKE 1 TABLET BY MOUTH ONCE DAILY 90 tablet 0  . metoprolol tartrate (LOPRESSOR) 100 MG tablet TAKE 1 TABLET BY MOUTH TWICE DAILY FOR HIGH BLOOD PRESSURE 180 tablet 1  . omeprazole (PRILOSEC) 20 MG capsule TAKE 1 CAPSULE BY MOUTH TWICE DAILY FOR  ACID  REFLUX 180 capsule 2  . pravastatin (PRAVACHOL) 40 MG tablet TAKE 1 TABLET BY MOUTH ONCE DAILY WITH BREAKFAST (NEEDS TO BE SEEN FOR REFILLS) 90 tablet 0   No current facility-administered medications for this visit.     Physical Exam: Vitals:   08/24/18 1154  BP: (!) 168/78  Pulse: 60  SpO2: 98%  Weight: 128 lb (58.1 kg)  Height: 5\' 8"  (1.727 m)    GEN- The patient is well appearing, alert and oriented x 3 today.  Wearing a [employer] hat today   Head- normocephalic, atraumatic Eyes-  Sclera clear, conjunctiva pink Ears- hearing intact Oropharynx- clear Lungs- Clear to ausculation bilaterally, normal work of breathing Chest- ICD pocket is well healed Heart- Regular rate and rhythm, no murmurs, rubs or gallops, PMI not laterally displaced GI- soft, NT, ND, + BS Extremities- no clubbing, cyanosis, or edema  ICD interrogation- reviewed in detail today,  See PACEART report  ekg tracing ordered today is personally reviewed and shows sinus with BiV pacing  Wt Readings from Last 3 Encounters:  08/24/18 128 lb (58.1 kg)  06/19/18 130 lb (59 kg)  06/05/18 130 lb 3.2 oz (59.1 kg)    Assessment and Plan:  1.  Chronic  systolic dysfunction/ nonischemic CM euvolemic today Stable on an appropriate medical regimen Normal ICD function See Pace Art report No changes today  2. HTN Stable No change required today  lattitude Return in a year   [name] MD, [facility] 08/24/2018 12:17 PM

## 2018-08-31 ENCOUNTER — Other Ambulatory Visit: Payer: Self-pay | Admitting: Family

## 2018-08-31 DIAGNOSIS — E785 Hyperlipidemia, unspecified: Secondary | ICD-10-CM

## 2018-09-30 ENCOUNTER — Other Ambulatory Visit: Payer: Self-pay | Admitting: Family

## 2018-09-30 DIAGNOSIS — I10 Essential (primary) hypertension: Secondary | ICD-10-CM

## 2018-10-07 ENCOUNTER — Other Ambulatory Visit: Payer: Self-pay | Admitting: Family

## 2018-10-07 DIAGNOSIS — I10 Essential (primary) hypertension: Secondary | ICD-10-CM

## 2018-10-08 ENCOUNTER — Other Ambulatory Visit: Payer: Self-pay | Admitting: Family

## 2018-10-08 DIAGNOSIS — I10 Essential (primary) hypertension: Secondary | ICD-10-CM

## 2018-10-09 ENCOUNTER — Other Ambulatory Visit: Payer: Self-pay | Admitting: Family

## 2018-10-09 DIAGNOSIS — I10 Essential (primary) hypertension: Secondary | ICD-10-CM

## 2018-10-23 ENCOUNTER — Other Ambulatory Visit: Payer: Self-pay

## 2018-10-23 ENCOUNTER — Ambulatory Visit (INDEPENDENT_AMBULATORY_CARE_PROVIDER_SITE_OTHER): Payer: Medicare Other | Admitting: Family

## 2018-10-23 ENCOUNTER — Encounter: Payer: Self-pay | Admitting: Family

## 2018-10-23 DIAGNOSIS — E785 Hyperlipidemia, unspecified: Secondary | ICD-10-CM

## 2018-10-23 DIAGNOSIS — L409 Psoriasis, unspecified: Secondary | ICD-10-CM | POA: Diagnosis not present

## 2018-10-23 DIAGNOSIS — I25119 Atherosclerotic heart disease of native coronary artery with unspecified angina pectoris: Secondary | ICD-10-CM

## 2018-10-23 DIAGNOSIS — I10 Essential (primary) hypertension: Secondary | ICD-10-CM

## 2018-10-23 DIAGNOSIS — F1010 Alcohol abuse, uncomplicated: Secondary | ICD-10-CM

## 2018-10-23 DIAGNOSIS — I5022 Chronic systolic (congestive) heart failure: Secondary | ICD-10-CM

## 2018-10-23 DIAGNOSIS — F17200 Nicotine dependence, unspecified, uncomplicated: Secondary | ICD-10-CM

## 2018-10-23 DIAGNOSIS — K219 Gastro-esophageal reflux disease without esophagitis: Secondary | ICD-10-CM | POA: Diagnosis not present

## 2018-10-23 DIAGNOSIS — I428 Other cardiomyopathies: Secondary | ICD-10-CM

## 2018-10-23 DIAGNOSIS — I472 Ventricular tachycardia: Secondary | ICD-10-CM

## 2018-10-23 DIAGNOSIS — G4700 Insomnia, unspecified: Secondary | ICD-10-CM

## 2018-10-23 DIAGNOSIS — I4729 Other ventricular tachycardia: Secondary | ICD-10-CM

## 2018-10-23 DIAGNOSIS — F411 Generalized anxiety disorder: Secondary | ICD-10-CM

## 2018-10-23 MED ORDER — METOPROLOL TARTRATE 100 MG PO TABS: ORAL_TABLET | ORAL | 1 refills | Status: DC

## 2018-10-23 MED ORDER — AMLODIPINE BESYLATE 10 MG PO TABS: 10.0000 mg | ORAL_TABLET | Freq: Every day | ORAL | 2 refills | Status: DC

## 2018-10-23 MED ORDER — LISINOPRIL 40 MG PO TABS: 40.0000 mg | ORAL_TABLET | Freq: Every day | ORAL | 1 refills | Status: DC

## 2018-10-23 MED ORDER — OMEPRAZOLE 20 MG PO CPDR: DELAYED_RELEASE_CAPSULE | ORAL | 2 refills | Status: DC

## 2018-10-23 MED ORDER — PRAVASTATIN SODIUM 40 MG PO TABS: 40.0000 mg | ORAL_TABLET | Freq: Every day | ORAL | 1 refills | Status: DC

## 2018-10-23 MED ORDER — ALPRAZOLAM 1 MG PO TABS: 1.0000 mg | ORAL_TABLET | Freq: Two times a day (BID) | ORAL | 5 refills | Status: DC | PRN

## 2018-10-23 NOTE — Progress Notes (Signed)
Virtual Visit via telephone Note  I connected with [name] on 10/23/18 at 10:56 AM by telephone and verified that I am speaking with the correct person using two identifiers. [name] is currently located at home and no one is currently with her during visit. The provider, Evelina Dun, FNP is located in their office at time of visit.  I discussed the limitations, risks, security and privacy concerns of performing an evaluation and management service by telephone and the availability of in person appointments. I also discussed with the patient that there may be a patient responsible charge related to this service. The patient expressed understanding and agreed to proceed.   History and Present Illness:  Pt presents to the office today for chronic follow up. PT is followed by Cardiologists for CAD, CHF, Atherosclerosis, Cardiomyopathy, and Ventricular tachycardia every 2 years. PT has pacemaker and is followed annually for this.   Pt is followed by Dermatologists forpsoriasis. Hypertension  This is a chronic problem. The current episode started more than 1 year ago (140/80). The problem has been resolved since onset. The problem is controlled. Associated symptoms include anxiety and malaise/fatigue. Pertinent negatives include no headaches, peripheral edema or shortness of breath. Risk factors for coronary artery disease include dyslipidemia, male gender and sedentary lifestyle. The current treatment provides moderate improvement. Hypertensive end-organ damage includes CAD/MI and heart failure.  Gastroesophageal Reflux  He complains of belching and heartburn. This is a chronic problem. The current episode started more than 1 year ago. The problem occurs occasionally. The problem has been waxing and waning. He has tried a PPI for the symptoms. The treatment provided moderate relief.  Anxiety  Presents for follow-up visit. Symptoms include depressed mood, excessive worry,  insomnia, irritability and nervous/anxious behavior. Patient reports no shortness of breath. Symptoms occur occasionally. The severity of symptoms is moderate. The quality of sleep is good.    Hyperlipidemia  This is a chronic problem. The current episode started more than 1 year ago. The problem is controlled. Recent lipid tests were reviewed and are normal. Pertinent negatives include no shortness of breath. Current antihyperlipidemic treatment includes statins. The current treatment provides moderate improvement of lipids. Risk factors for coronary artery disease include dyslipidemia, male sex, a sedentary lifestyle and hypertension.  Insomnia  Primary symptoms: difficulty falling asleep, frequent awakening, malaise/fatigue.  The current episode started more than one year. The onset quality is gradual. The problem occurs intermittently. The problem has been waxing and waning since onset.  Alcohol Abuse  He reports he drinks 3-4 beers a day.     Review of Systems  Constitutional: Positive for irritability and malaise/fatigue.  Respiratory: Negative for shortness of breath.   Gastrointestinal: Positive for heartburn.  Neurological: Negative for headaches.  Psychiatric/Behavioral: The patient is nervous/anxious and has insomnia.   All other systems reviewed and are negative.    Observations/Objective: No SOB or distress   Assessment and Plan: [name] comes in today with chief complaint of No chief complaint on file.   Diagnosis and orders addressed:  1. Gastroesophageal reflux disease, esophagitis presence not specified -Diet discussed- Avoid fried, spicy, citrus foods, caffeine and alcohol -Do not eat 2-3 hours before bedtime -Encouraged small frequent meals -Avoid NSAID's - omeprazole (PRILOSEC) 20 MG capsule; TAKE 1 CAPSULE BY MOUTH TWICE DAILY FOR  ACID  REFLUX  Dispense: 180 capsule; Refill: 2  2. GAD (generalized anxiety disorder) - ALPRAZolam (XANAX) 1 MG  tablet; Take 1 tablet (1 mg  total) by mouth 2 (two) times daily as needed.  Dispense: 60 tablet; Refill: 5  3. Essential hypertension - amLODipine (NORVASC) 10 MG tablet; Take 1 tablet (10 mg total) by mouth daily.  Dispense: 90 tablet; Refill: 2 - lisinopril (ZESTRIL) 40 MG tablet; Take 1 tablet (40 mg total) by mouth daily.  Dispense: 90 tablet; Refill: 1 - metoprolol tartrate (LOPRESSOR) 100 MG tablet; TAKE 1 TABLET BY MOUTH TWICE DAILY FOR HIGH BLOOD PRESSURE  Dispense: 180 tablet; Refill: 1  4. Hyperlipidemia, unspecified hyperlipidemia type - pravastatin (PRAVACHOL) 40 MG tablet; Take 1 tablet (40 mg total) by mouth daily.  Dispense: 90 tablet; Refill: 1  5. Psoriasis  6. TOBACCO ABUSE  7. Chronic systolic heart failure ([place])  8. Atherosclerosis of native coronary artery with angina pectoris, unspecified whether native or transplanted heart ([place])  9. PAROXYSMAL VENTRICULAR TACHYCARDIA  10. Alcohol abuse  11. Insomnia, unspecified type  12. Nonischemic cardiomyopathy ([place])   Labs reviewed PT reviewed in [place] Controlled database- No red flags noted Health Maintenance reviewed Diet and exercise encouraged  Follow up plan: 6 months     I discussed the assessment and treatment plan with the patient. The patient was provided an opportunity to ask questions and all were answered. The patient agreed with the plan and demonstrated an understanding of the instructions.   The patient was advised to call back or seek an in-person evaluation if the symptoms worsen or if the condition fails to improve as anticipated.  The above assessment and management plan was discussed with the patient. The patient verbalized understanding of and has agreed to the management plan. Patient is aware to call the clinic if symptoms persist or worsen. Patient is aware when to return to the clinic for a follow-up visit. Patient educated on when it is appropriate to go to the emergency department.   Time  call ended:  11:10AM  I provided  14 minutes of non-face-to-face time during this encounter.    Evelina Dun, FNP

## 2018-10-29 ENCOUNTER — Ambulatory Visit (INDEPENDENT_AMBULATORY_CARE_PROVIDER_SITE_OTHER): Payer: Medicare Other | Admitting: *Deleted

## 2018-10-29 ENCOUNTER — Other Ambulatory Visit: Payer: Self-pay

## 2018-10-29 DIAGNOSIS — I5022 Chronic systolic (congestive) heart failure: Secondary | ICD-10-CM | POA: Diagnosis not present

## 2018-10-29 DIAGNOSIS — I428 Other cardiomyopathies: Secondary | ICD-10-CM

## 2018-10-30 LAB — CUP PACEART REMOTE DEVICE CHECK
Battery Remaining Longevity: 66 mo
Battery Remaining Percentage: 100 %
Brady Statistic RA Percent Paced: 0 %
Brady Statistic RV Percent Paced: 100 %
Date Time Interrogation Session: 20200511043100
HighPow Impedance: 58 Ohm
Implantable Lead Implant Date: 20040312
Implantable Lead Implant Date: 20040312
Implantable Lead Implant Date: 20040312
Implantable Lead Location: 753858
Implantable Lead Location: 753859
Implantable Lead Location: 753860
Implantable Lead Model: 158
Implantable Lead Model: 4087
Implantable Lead Model: 4513
Implantable Lead Serial Number: 129158
Implantable Lead Serial Number: 209446
Implantable Lead Serial Number: 407264
Implantable Pulse Generator Implant Date: 20150917
Lead Channel Impedance Value: 602 Ohm
Lead Channel Impedance Value: 631 Ohm
Lead Channel Impedance Value: 881 Ohm
Lead Channel Pacing Threshold Amplitude: 0.6 V
Lead Channel Pacing Threshold Amplitude: 1 V
Lead Channel Pacing Threshold Amplitude: 1.9 V
Lead Channel Pacing Threshold Pulse Width: 0.4 ms
Lead Channel Pacing Threshold Pulse Width: 0.4 ms
Lead Channel Pacing Threshold Pulse Width: 0.8 ms
Lead Channel Setting Pacing Amplitude: 2 V
Lead Channel Setting Pacing Amplitude: 2.5 V
Lead Channel Setting Pacing Amplitude: 3 V
Lead Channel Setting Pacing Pulse Width: 0.4 ms
Lead Channel Setting Pacing Pulse Width: 0.8 ms
Lead Channel Setting Sensing Sensitivity: 0.6 mV
Lead Channel Setting Sensing Sensitivity: 1 mV
Pulse Gen Serial Number: 106298

## 2018-11-05 ENCOUNTER — Other Ambulatory Visit: Payer: Self-pay | Admitting: Dermatology

## 2018-11-05 DIAGNOSIS — D485 Neoplasm of uncertain behavior of skin: Secondary | ICD-10-CM | POA: Diagnosis not present

## 2018-11-05 DIAGNOSIS — L409 Psoriasis, unspecified: Secondary | ICD-10-CM | POA: Diagnosis not present

## 2018-11-05 DIAGNOSIS — L308 Other specified dermatitis: Secondary | ICD-10-CM | POA: Diagnosis not present

## 2018-11-06 NOTE — Progress Notes (Signed)
Virtual Visit via Telephone Note   This visit type was conducted due to national recommendations for restrictions regarding the COVID-19 Pandemic (e.g. social distancing) in an effort to limit this patient's exposure and mitigate transmission in our community.  Due to his co-morbid illnesses, this patient is at least at moderate risk for complications without adequate follow up.  This format is felt to be most appropriate for this patient at this time.  The patient did not have access to video technology/had technical difficulties with video requiring transitioning to audio format only (telephone).  All issues noted in this document were discussed and addressed.  No physical exam could be performed with this format.  Please refer to the patient's chart for his  consent to telehealth for [facility].   Date:  11/07/2018   ID:  [name], [name], 1942, MRN [id number]  Patient Location: Home Provider Location: Home  PCP:  Sharion Balloon, FNP  Cardiologist: Minus Breeding Electrophysiologist:  [name], MD   Evaluation Performed:  Follow-Up Visit  Chief Complaint:  CAD   History of Present Illness:    DMARCUS Koch is a 78 y.o. male who presents for follow up of non ischemic cardiomyopathy.  Who presents today for routine electrophysiology followup.  Since last being seen in our clinic he has had no cardiac issues.  He says he is feels particularly short and because of the coronavirus.  He is not having any acute cardiovascular.  He denies any chest pressure, neck or arm discomfort.  He is not having any palpitations, presyncope or syncope.  He has no weight gain or edema.  The patient does not have symptoms concerning for COVID-19 infection (fever, chills, cough, or new shortness of breath).    Past Medical History:  Diagnosis Date  . Anxiety   . Arthritis   . CAD (coronary artery disease)    Nonobstructive cath 2012.  PCI to RCA 2003  . Carotid artery disease  ([place])   . Cataract   . CHF (congestive heart failure) ([place])   . COPD (chronic obstructive pulmonary disease) ([place])   . Depression   . Dyslipidemia   . EtOH dependence ([place])   . GERD (gastroesophageal reflux disease)   . Hyperlipidemia   . Hypertension   . Insomnia   . Nonischemic cardiomyopathy ([facility])    EF was 20% (60% last echo 2015)  . Pacemaker    and defibrillator  . Peptic ulcer disease   . Tobacco abuse    Past Surgical History:  Procedure Laterality Date  . BIV ICD GENERTAOR CHANGE OUT N/A 03/06/2014   [employer] Gen change by Dr Rayann Heman Beckie Salts CRT-D with LV1 header)  . biventricular defibrillator implantation     [employer] and pacemaker  . CATARACT EXTRACTION W/PHACO Right 12/10/2015   Procedure: CATARACT EXTRACTION PHACO AND INTRAOCULAR LENS PLACEMENT RIGHT EYE; CDE: 19.30;  Surgeon: Tonny Branch, MD;  Location: [facility];  Service: Ophthalmology;  Laterality: Right;  . CATARACT EXTRACTION W/PHACO Left 01/14/2016   Procedure: CATARACT EXTRACTION PHACO AND INTRAOCULAR LENS PLACEMENT LEFT EYE; CDE:  8.03;  Surgeon: Tonny Branch, MD;  Location: [facility];  Service: Ophthalmology;  Laterality: Left;  . COLONOSCOPY    . EYE SURGERY    . Ulcer surgery     repair of stomach ulcer     Current Meds  Medication Sig  . acitretin (SORIATANE) 25 MG capsule Take 25 mg by mouth daily.  Marland Kitchen ALPRAZolam (XANAX) 1 MG tablet  Take 1 tablet (1 mg total) by mouth 2 (two) times daily as needed.  Marland Kitchen amLODipine (NORVASC) 10 MG tablet Take 1 tablet (10 mg total) by mouth daily.  Marland Kitchen aspirin 81 MG tablet Take 81 mg by mouth daily.  Marland Kitchen lisinopril (ZESTRIL) 40 MG tablet Take 1 tablet (40 mg total) by mouth daily.  . metoprolol tartrate (LOPRESSOR) 100 MG tablet TAKE 1 TABLET BY MOUTH TWICE DAILY FOR HIGH BLOOD PRESSURE  . omeprazole (PRILOSEC) 20 MG capsule TAKE 1 CAPSULE BY MOUTH TWICE DAILY FOR  ACID  REFLUX  . pravastatin (PRAVACHOL) 40 MG tablet Take 1 tablet (40 mg total) by mouth daily.      Allergies:   Sudafed [pseudoephedrine hcl]; Prednisone; and Avelox [moxifloxacin hcl in nacl]   Social History   Tobacco Use  . Smoking status: Light Tobacco Smoker    Packs/day: 0.25    Years: 45.00    Pack years: 11.25    Types: Cigarettes  . Smokeless tobacco: Former Systems developer    Types: Chew    Quit date: 06/20/1989  . Tobacco comment: he is not interested in cessation  Substance Use Topics  . Alcohol use: Yes    Alcohol/week: 6.0 standard drinks    Types: 6 Cans of beer per week    Comment: drings 4-6 beers per day  . Drug use: No    Types: Marijuana    Comment: last used marijuana yesterday     Family Hx: The patient's family history includes Arthritis/Rheumatoid in his daughter; Cancer in his brother; Colon polyps in his sister; GI Bleed in his brother and sister; Heart disease in his brother and brother; Heart failure in his father; Psoriasis in his daughter. There is no history of Colon cancer, Esophageal cancer, Rectal cancer, or Stomach cancer.  ROS:   Please see the history of present illness.    As stated in the HPI and negative for all other systems.   Prior CV studies:   The following studies were reviewed today:    Labs/Other Tests and Data Reviewed:    EKG:  No ECG reviewed.  Recent Labs: 04/24/2018: ALT 52; BUN 13; Creatinine, Ser 0.96; Hemoglobin 11.6; Platelets 325; Potassium 4.5; Sodium 130   Recent Lipid Panel Lab Results  Component Value Date/Time   CHOL 162 04/24/2018 12:04 PM   TRIG 84 04/24/2018 12:04 PM   TRIG CANCELED 01/31/2014 04:18 PM   HDL 63 04/24/2018 12:04 PM   HDL CANCELED 01/31/2014 04:18 PM   CHOLHDL 2.6 04/24/2018 12:04 PM   LDLCALC 82 04/24/2018 12:04 PM    Wt Readings from Last 3 Encounters:  08/24/18 128 lb (58.1 kg)  06/19/18 130 lb (59 kg)  06/05/18 130 lb 3.2 oz (59.1 kg)     Objective:    Vital Signs:  BP (!) 155/60 (BP Location: Left Arm, Patient Position: Sitting, Cuff Size: Normal)   Pulse 66   Ht 5\' 8"   (1.727 m)   BMI 19.46 kg/m     VS reviewed  ASSESSMENT & PLAN:    VENTRICULAR TACHYCARDIA: He is up to date with ICD follow up.  No change in therapy.   CARDIOMOPATHY: His EF was normal in 2015.  No further imaging in the absence of any change in clinical symptoms.  =  TOBACCO ABUSE:   He has cut back but cannot quit.   DYSLIPIDEMIA:  LDL is 82 with an HDL of 63.  Continue current therapy.   HTN:  The BP is mildly elevated  but he says it fluctuates.  He is often below 140s.  I will make no changes to his medical regimen.  ETOH: He sounded inebriated today.  We talked about his alcohol intake.  Says he drinks a least 3 or 4 Natural Lights per day.  We talked about reducing this.   COVID-19 Education: The signs and symptoms of COVID-19 were discussed with the patient and how to seek care for testing (follow up with PCP or arrange E-visit).  The importance of social distancing was discussed today.  Time:   Today, I have spent 16 minutes with the patient with telehealth technology discussing the above problems.     Medication Adjustments/Labs and Tests Ordered: Current medicines are reviewed at length with the patient today.  Concerns regarding medicines are outlined above.   Tests Ordered: No orders of the defined types were placed in this encounter.   Medication Changes: No orders of the defined types were placed in this encounter.   Disposition:  Follow up with me in one year in [place], Minus Breeding, MD  11/07/2018 2:Koch PM    [place]

## 2018-11-07 ENCOUNTER — Encounter: Payer: Self-pay | Admitting: Cardiology

## 2018-11-07 ENCOUNTER — Other Ambulatory Visit: Payer: Self-pay

## 2018-11-07 ENCOUNTER — Telehealth (INDEPENDENT_AMBULATORY_CARE_PROVIDER_SITE_OTHER): Payer: Medicare Other | Admitting: Cardiology

## 2018-11-07 VITALS — BP 155/60 | HR 66 | Ht 68.0 in

## 2018-11-07 DIAGNOSIS — I10 Essential (primary) hypertension: Secondary | ICD-10-CM

## 2018-11-07 DIAGNOSIS — Z720 Tobacco use: Secondary | ICD-10-CM

## 2018-11-07 DIAGNOSIS — I428 Other cardiomyopathies: Secondary | ICD-10-CM

## 2018-11-07 DIAGNOSIS — Z7189 Other specified counseling: Secondary | ICD-10-CM

## 2018-11-07 NOTE — Patient Instructions (Signed)
Medication Instructions:  The current medical regimen is effective;  continue present plan and medications.  If you need a refill on your cardiac medications before your next appointment, please call your pharmacy.   Follow-Up: Follow up in 1 year with Dr. Hochrein.  You will receive a letter in the mail 2 months before you are due.  Please call us when you receive this letter to schedule your follow up appointment.  Thank you for choosing [facility]!!     

## 2018-11-13 NOTE — Progress Notes (Signed)
Remote ICD transmission.   

## 2018-11-28 ENCOUNTER — Ambulatory Visit: Payer: Self-pay | Admitting: Cardiology

## 2018-12-03 DIAGNOSIS — Z79899 Other long term (current) drug therapy: Secondary | ICD-10-CM | POA: Diagnosis not present

## 2019-01-28 ENCOUNTER — Ambulatory Visit (INDEPENDENT_AMBULATORY_CARE_PROVIDER_SITE_OTHER): Payer: Medicare Other | Admitting: *Deleted

## 2019-01-28 DIAGNOSIS — I428 Other cardiomyopathies: Secondary | ICD-10-CM

## 2019-01-28 LAB — CUP PACEART REMOTE DEVICE CHECK
Battery Remaining Longevity: 60 mo
Battery Remaining Percentage: 94 %
Brady Statistic RA Percent Paced: 0 %
Brady Statistic RV Percent Paced: 100 %
Date Time Interrogation Session: 20200810043100
HighPow Impedance: 58 Ohm
Implantable Lead Implant Date: 20040312
Implantable Lead Implant Date: 20040312
Implantable Lead Implant Date: 20040312
Implantable Lead Location: 753858
Implantable Lead Location: 753859
Implantable Lead Location: 753860
Implantable Lead Model: 158
Implantable Lead Model: 4087
Implantable Lead Model: 4513
Implantable Lead Serial Number: 129158
Implantable Lead Serial Number: 209446
Implantable Lead Serial Number: 407264
Implantable Pulse Generator Implant Date: 20150917
Lead Channel Impedance Value: 570 Ohm
Lead Channel Impedance Value: 595 Ohm
Lead Channel Impedance Value: 886 Ohm
Lead Channel Pacing Threshold Amplitude: 0.6 V
Lead Channel Pacing Threshold Amplitude: 1 V
Lead Channel Pacing Threshold Amplitude: 1.9 V
Lead Channel Pacing Threshold Pulse Width: 0.4 ms
Lead Channel Pacing Threshold Pulse Width: 0.4 ms
Lead Channel Pacing Threshold Pulse Width: 0.8 ms
Lead Channel Setting Pacing Amplitude: 2 V
Lead Channel Setting Pacing Amplitude: 2.5 V
Lead Channel Setting Pacing Amplitude: 3 V
Lead Channel Setting Pacing Pulse Width: 0.4 ms
Lead Channel Setting Pacing Pulse Width: 0.8 ms
Lead Channel Setting Sensing Sensitivity: 0.6 mV
Lead Channel Setting Sensing Sensitivity: 1 mV
Pulse Gen Serial Number: 106298

## 2019-02-05 ENCOUNTER — Encounter: Payer: Self-pay | Admitting: Cardiology

## 2019-02-05 NOTE — Progress Notes (Signed)
Remote ICD transmission.   

## 2019-04-16 ENCOUNTER — Other Ambulatory Visit: Payer: Self-pay

## 2019-04-17 ENCOUNTER — Ambulatory Visit (INDEPENDENT_AMBULATORY_CARE_PROVIDER_SITE_OTHER): Payer: Medicare Other

## 2019-04-17 DIAGNOSIS — Z23 Encounter for immunization: Secondary | ICD-10-CM | POA: Diagnosis not present

## 2019-04-18 ENCOUNTER — Other Ambulatory Visit: Payer: Self-pay

## 2019-04-18 ENCOUNTER — Ambulatory Visit (INDEPENDENT_AMBULATORY_CARE_PROVIDER_SITE_OTHER): Payer: Medicare Other | Admitting: *Deleted

## 2019-04-18 VITALS — BP 135/72 | Ht 68.0 in | Wt 130.0 lb

## 2019-04-18 DIAGNOSIS — Z0000 Encounter for general adult medical examination without abnormal findings: Secondary | ICD-10-CM | POA: Diagnosis not present

## 2019-04-18 NOTE — Patient Instructions (Signed)
  Mr. [name] , Thank you for taking time to come for your Medicare Wellness Visit. I appreciate your ongoing commitment to your health goals. Please review the following plan we discussed and let me know if I can assist you in the future.   These are the goals we discussed: Goals    . Exercise 150 min/wk Moderate Activity       This is a list of the screening recommended for you and due dates:  Health Maintenance  Topic Date Due  . Colon Cancer Screening  06/18/2019*  . Tetanus Vaccine  11/01/2027  . Flu Shot  Completed  . Pneumonia vaccines  Completed  *Topic was postponed. The date shown is not the original due date.

## 2019-04-18 NOTE — Progress Notes (Addendum)
MEDICARE ANNUAL WELLNESS VISIT  04/18/2019  Telephone Visit Disclaimer This Medicare AWV was conducted by telephone due to national recommendations for restrictions regarding the COVID-19 Pandemic (e.g. social distancing).  I verified, using two identifiers, that I am speaking with [name] or their authorized healthcare agent. I discussed the limitations, risks, security, and privacy concerns of performing an evaluation and management service by telephone and the potential availability of an in-person appointment in the future. The patient expressed understanding and agreed to proceed.   Subjective:  [name] is a 78 y.o. male patient of Hawks, Theador Hawthorne, FNP who had a Medicare Annual Wellness Visit today via telephone. Mallik is Retired and lives alone. he has 4 children. he reports that he is socially active and does interact with friends/family regularly. he is not physically active and enjoys collecting civil war memorabilia and metal detecting.  Patient Care Team: Sharion Balloon, FNP as PCP - General (Nurse Practitioner) [name], MD as PCP - Electrophysiology (Cardiology)  Advanced Directives 04/18/2019 04/26/2018 01/14/2016 12/10/2015 12/08/2015 03/06/2014 12/19/2011  Does Patient Have a Medical Advance Directive? Yes No No No No No Patient does not have advance directive;Patient would not like information  Type of Scientist, forensic Power of [place];Living will - - - - - -  Does patient want to make changes to medical advance directive? No - Patient declined - - - - - -  Copy of Paia in Chart? No - copy requested - - - - - -  Would patient like information on creating a medical advance directive? - - Yes - Educational materials given No - patient declined information No - patient declined information Yes Higher education careers adviser given -  Some encounter information is confidential and restricted. Go to Review Flowsheets activity to  see all data.    Hospital Utilization Over the Past 12 Months: # of hospitalizations or ER visits: 0 # of surgeries: 0    Review of Systems    Patient reports that his overall health is better compared to last year.  General ROS: negative  Patient Reported Readings (BP, Pulse, CBG, Weight, etc) BP 135/72 Comment: home reading  Ht 5\' 8"  (1.727 m)   Wt 130 lb (59 kg)   BMI 19.77 kg/m    Pain Assessment       Current Medications & Allergies (verified) Allergies as of 04/18/2019      Reactions   Sudafed [pseudoephedrine Hcl] Other (See Comments)   Unknown   Prednisone Other (See Comments)   Depression and "personality changes".   Avelox [moxifloxacin Hcl In Nacl] Other (See Comments)   Unknown       Medication List       Accurate as of April 18, 2019  8:50 AM. If you have any questions, ask your nurse or doctor.        acitretin 25 MG capsule Commonly known as: SORIATANE Take 25 mg by mouth daily.   ALPRAZolam 1 MG tablet Commonly known as: XANAX Take 1 tablet (1 mg total) by mouth 2 (two) times daily as needed.   amLODipine 10 MG tablet Commonly known as: NORVASC Take 1 tablet (10 mg total) by mouth daily.   aspirin 81 MG tablet Take 81 mg by mouth daily.   cetirizine 10 MG tablet Commonly known as: ZYRTEC Take 10 mg by mouth daily as needed for allergies.   lisinopril 40 MG tablet Commonly known as: ZESTRIL Take 1 tablet (  40 mg total) by mouth daily.   metoprolol tartrate 100 MG tablet Commonly known as: LOPRESSOR TAKE 1 TABLET BY MOUTH TWICE DAILY FOR HIGH BLOOD PRESSURE   omeprazole 20 MG capsule Commonly known as: PRILOSEC TAKE 1 CAPSULE BY MOUTH TWICE DAILY FOR  ACID  REFLUX   pravastatin 40 MG tablet Commonly known as: PRAVACHOL Take 1 tablet (40 mg total) by mouth daily.       History (reviewed): Past Medical History:  Diagnosis Date  . Anxiety   . Arthritis   . CAD (coronary artery disease)    Nonobstructive cath 2012.  PCI  to RCA 2003  . Carotid artery disease ([place])   . Cataract   . CHF (congestive heart failure) ([place])   . COPD (chronic obstructive pulmonary disease) ([place])   . Depression   . Dyslipidemia   . EtOH dependence ([place])   . GERD (gastroesophageal reflux disease)   . Hyperlipidemia   . Hypertension   . Insomnia   . Nonischemic cardiomyopathy (HCC)    EF was 20% (60% last echo 2015)  . Pacemaker    and defibrillator  . Peptic ulcer disease   . Tobacco abuse    Past Surgical History:  Procedure Laterality Date  . BIV ICD GENERTAOR CHANGE OUT N/A 03/06/2014   [employer] Gen change by Dr Rayann Heman Beckie Salts CRT-D with LV1 header)  . biventricular defibrillator implantation     [employer] and pacemaker  . CATARACT EXTRACTION W/PHACO Right 12/10/2015   Procedure: CATARACT EXTRACTION PHACO AND INTRAOCULAR LENS PLACEMENT RIGHT EYE; CDE: 19.30;  Surgeon: Tonny Branch, MD;  Location: [facility];  Service: Ophthalmology;  Laterality: Right;  . CATARACT EXTRACTION W/PHACO Left 01/14/2016   Procedure: CATARACT EXTRACTION PHACO AND INTRAOCULAR LENS PLACEMENT LEFT EYE; CDE:  8.03;  Surgeon: Tonny Branch, MD;  Location: [facility];  Service: Ophthalmology;  Laterality: Left;  . COLONOSCOPY    . EYE SURGERY    . Ulcer surgery     repair of stomach ulcer   Family History  Problem Relation Age of Onset  . Heart failure Father   . Colon polyps Sister   . GI Bleed Sister   . GI Bleed Brother   . Heart attack Brother   . Psoriasis Daughter   . Arthritis/Rheumatoid Daughter   . Cancer Brother   . Heart disease Brother   . Heart disease Brother   . Colon cancer Neg Hx   . Esophageal cancer Neg Hx   . Rectal cancer Neg Hx   . Stomach cancer Neg Hx    Social History   Socioeconomic History  . Marital status: Divorced    Spouse name: Not on file  . Number of children: 4  . Years of education: Some college  . Highest education level: Some college, no degree  Occupational History  . Occupation:  Retired    Fish farm manager: [employer]  Social Needs  . Financial resource strain: Not very hard  . Food insecurity    Worry: Never true    Inability: Never true  . Transportation needs    Medical: No    Non-medical: No  Tobacco Use  . Smoking status: Light Tobacco Smoker    Packs/day: 0.25    Years: 45.00    Pack years: 11.25    Types: Cigarettes  . Smokeless tobacco: Former Systems developer    Types: Chew    Quit date: 06/20/1989  . Tobacco comment: he is not interested in cessation  Substance and Sexual  Activity  . Alcohol use: Yes    Alcohol/week: 6.0 standard drinks    Types: 6 Cans of beer per week    Comment: drings 4-6 beers per day  . Drug use: No    Types: Marijuana    Comment: last used marijuana yesterday  . Sexual activity: Not Currently    Birth control/protection: None  Lifestyle  . Physical activity    Days per week: 0 days    Minutes per session: 0 min  . Stress: Very much  Relationships  . Social connections    Talks on phone: More than three times a week    Gets together: More than three times a week    Attends religious service: Never    Active member of club or organization: No    Attends meetings of clubs or organizations: Never    Relationship status: Divorced  Other Topics Concern  . Not on file  Social History Narrative   Divorced 2 sons 2 daughters   Is retired from [employer] forces logistics/support/administrative officer, he was a Building control surveyor   3 alcoholic beverages daily no caffeine no drugs he still smokes no other tobacco or drug use    Activities of Daily Living In your present state of health, do you have any difficulty performing the following activities: 04/18/2019  Hearing? N  Vision? Y  Comment wears RX glasses  Difficulty concentrating or making decisions? Y  Comment sometimes  Walking or climbing stairs? N  Dressing or bathing? N  Doing errands, shopping? N  Preparing Food and eating ? N  Using the Toilet? N  In the past six months, have you accidently leaked urine?  N  Do you have problems with loss of bowel control? N  Managing your Medications? Y  Managing your Finances? Y  Housekeeping or managing your Housekeeping? N  Some recent data might be hidden    Patient Education/ Literacy    Exercise Current Exercise Habits: The patient does not participate in regular exercise at present, Exercise limited by: orthopedic condition(s)  Diet Patient reports consuming 2 meals a day and 1 snack(s) a day Patient reports that his primary diet is: Regular Patient reports that she does have regular access to food.   Depression Screen PHQ 2/9 Scores 04/18/2019 04/26/2018 04/24/2018 10/30/2017 10/16/2017 05/02/2017 12/23/2016  PHQ - 2 Score 1 1 1 2 2 1 6   PHQ- 9 Score - - - 8 5 - 13     Fall Risk Fall Risk  04/18/2019 10/16/2017 05/02/2017 12/23/2016 11/09/2016  Falls in the past year? 1 Yes Yes No No  Number falls in past yr: 0 1 1 - -  Injury with Fall? 0 No No - -  Risk for fall due to : - History of fall(s);Impaired balance/gait - - -  Follow up - Falls prevention discussed - - -     Objective:  [name] seemed alert and oriented and he participated appropriately during our telephone visit.  Blood Pressure Weight BMI  BP Readings from Last 3 Encounters:  04/18/19 135/72  11/07/18 (!) 155/60  08/24/18 (!) 168/78   Wt Readings from Last 3 Encounters:  04/18/19 130 lb (59 kg)  08/24/18 128 lb (58.1 kg)  06/19/18 130 lb (59 kg)   BMI Readings from Last 1 Encounters:  04/18/19 19.77 kg/m    *Unable to obtain current vital signs, weight, and BMI due to telephone visit type  Hearing/Vision  . Trellis did not seem to have difficulty with  hearing/understanding during the telephone conversation . Reports that he has had a formal eye exam by an eye care professional within the past year . Reports that he has not had a formal hearing evaluation within the past year *Unable to fully assess hearing and vision during telephone visit type  Cognitive  Function: 6CIT Screen 04/18/2019  What Year? 0 points  What month? 0 points  What time? 0 points  Count back from 20 0 points  Months in reverse 0 points  Repeat phrase 4 points  Total Score 4   (Normal:0-7, Significant for Dysfunction: >8)  Normal Cognitive Function Screening: Yes   Immunization & Health Maintenance Record Immunization History  Administered Date(s) Administered  . Fluad Quad(high Dose 65+) 04/17/2019  . Influenza Whole 05/13/2008  . Influenza, High Dose Seasonal PF 04/24/2014, 04/09/2015, 03/28/2017, 04/24/2018  . Influenza,inj,Quad PF,6+ Mos 03/15/2016  . Influenza-Unspecified 03/20/2013  . Pneumococcal Conjugate-13 09/24/2015  . Pneumococcal Polysaccharide-23 03/28/2017  . Tdap 10/31/2017    Health Maintenance  Topic Date Due  . COLONOSCOPY  01/10/2019  . TETANUS/TDAP  11/01/2027  . INFLUENZA VACCINE  Completed  . PNA vac Low Risk Adult  Completed       Assessment  This is a routine wellness examination for [employer].  Health Maintenance: Due or Overdue Health Maintenance Due  Topic Date Due  . COLONOSCOPY  01/10/2019    [name] does not need a referral for Community Assistance: Care Management:   no Social Work:    no Prescription Assistance:  no Nutrition/Diabetes Education:  no   Plan:  Personalized Goals Goals Addressed            This Visit's Progress   . Exercise 150 min/wk Moderate Activity   On track     Personalized Health Maintenance & Screening Recommendations  PSA  Lung Cancer Screening Recommended: no (Low Dose CT Chest recommended if Age 19-80 years, 30 pack-year currently smoking OR have quit w/in past 15 years) Hepatitis C Screening recommended: no HIV Screening recommended: no  Advanced Directives: Written information was not prepared per patient's request.  Referrals & Orders No orders of the defined types were placed in this encounter.   Follow-up Plan . Follow-up with Sharion Balloon, FNP as planned    I have personally reviewed and noted the following in the patient's chart:   . Medical and social history . Use of alcohol, tobacco or illicit drugs  . Current medications and supplements . Functional ability and status . Nutritional status . Physical activity . Advanced directives . List of other physicians . Hospitalizations, surgeries, and ER visits in previous 12 months . Vitals . Screenings to include cognitive, depression, and falls . Referrals and appointments  In addition, I have reviewed and discussed with [name] certain preventive protocols, quality metrics, and best practice recommendations. A written personalized care plan for preventive services as well as general preventive health recommendations is available and can be mailed to the patient at his request.      Bullins, Cameron Proud  04/18/2019   I have reviewed and agree with the above AWV documentation.   Evelina Dun, FNP

## 2019-04-29 ENCOUNTER — Ambulatory Visit (INDEPENDENT_AMBULATORY_CARE_PROVIDER_SITE_OTHER): Payer: Medicare Other | Admitting: *Deleted

## 2019-04-29 DIAGNOSIS — I5022 Chronic systolic (congestive) heart failure: Secondary | ICD-10-CM

## 2019-04-29 DIAGNOSIS — I428 Other cardiomyopathies: Secondary | ICD-10-CM | POA: Diagnosis not present

## 2019-04-30 ENCOUNTER — Other Ambulatory Visit: Payer: Self-pay | Admitting: Family

## 2019-04-30 DIAGNOSIS — F411 Generalized anxiety disorder: Secondary | ICD-10-CM

## 2019-04-30 LAB — CUP PACEART REMOTE DEVICE CHECK
Battery Remaining Longevity: 54 mo
Battery Remaining Percentage: 90 %
Brady Statistic RA Percent Paced: 0 %
Brady Statistic RV Percent Paced: 100 %
Date Time Interrogation Session: 20201109053100
HighPow Impedance: 57 Ohm
Implantable Lead Implant Date: 20040312
Implantable Lead Implant Date: 20040312
Implantable Lead Implant Date: 20040312
Implantable Lead Location: 753858
Implantable Lead Location: 753859
Implantable Lead Location: 753860
Implantable Lead Model: 158
Implantable Lead Model: 4087
Implantable Lead Model: 4513
Implantable Lead Serial Number: 129158
Implantable Lead Serial Number: 209446
Implantable Lead Serial Number: 407264
Implantable Pulse Generator Implant Date: 20150917
Lead Channel Impedance Value: 578 Ohm
Lead Channel Impedance Value: 593 Ohm
Lead Channel Impedance Value: 884 Ohm
Lead Channel Pacing Threshold Amplitude: 0.6 V
Lead Channel Pacing Threshold Amplitude: 1 V
Lead Channel Pacing Threshold Amplitude: 1.9 V
Lead Channel Pacing Threshold Pulse Width: 0.4 ms
Lead Channel Pacing Threshold Pulse Width: 0.4 ms
Lead Channel Pacing Threshold Pulse Width: 0.8 ms
Lead Channel Setting Pacing Amplitude: 2 V
Lead Channel Setting Pacing Amplitude: 2.5 V
Lead Channel Setting Pacing Amplitude: 3 V
Lead Channel Setting Pacing Pulse Width: 0.4 ms
Lead Channel Setting Pacing Pulse Width: 0.8 ms
Lead Channel Setting Sensing Sensitivity: 0.6 mV
Lead Channel Setting Sensing Sensitivity: 1 mV
Pulse Gen Serial Number: 106298

## 2019-05-01 ENCOUNTER — Telehealth: Payer: Self-pay | Admitting: Family

## 2019-05-01 NOTE — Chronic Care Management (AMB) (Signed)
°  Chronic Care Management   Outreach Note  05/01/2019 Name: [name] MRN: [id number] DOB: 12-13-1940  Referred by: Sharion Balloon, FNP Reason for referral : Chronic Care Management (Initial CCM outreach )   An unsuccessful telephone outreach was attempted today. The patient was referred to the case management team by for assistance with care management and care coordination.   Follow Up Plan: The care management team will reach out to the patient again over the next 7 days.   [place], [place] [address] Direct Dial: [email or web address]  Website: [email or web address]

## 2019-05-01 NOTE — Chronic Care Management (AMB) (Signed)
Chronic Care Management  ° °Note ° °05/01/2019 °Name: Tonny C Summerhill MRN: [id number] DOB: 03/21/1941 ° °Draycen C Eichelberger is a 77 y.o. year old male who is a primary care patient of Hawks, Christy A, FNP. I reached out to Drago C Westgate by phone today in response to a referral sent by Mr. Kristi C Braaksma's health plan.    ° °Mr. Tomei was given information about Chronic Care Management services today including:  °1. CCM service includes personalized support from designated clinical staff supervised by his physician, including individualized plan of care and coordination with other care providers °2. 24/7 contact phone numbers for assistance for urgent and routine care needs. °3. Service will only be billed when office clinical staff spend 20 minutes or more in a month to coordinate care. °4. Only one practitioner may furnish and bill the service in a calendar month. °5. The patient may stop CCM services at any time (effective at the end of the month) by phone call to the office staff. °6. The patient will be responsible for cost sharing (co-pay) of up to 20% of the service fee (after annual deductible is met). ° °Patient agreed to services and verbal consent obtained.  ° °Follow up plan: °Telephone appointment with CCM team member scheduled for: 05/13/2019 ° °Bernice Cicero °Care Guide, Embedded Care Coordination °[facility]   Care Management  °[place], [place] [address] °Direct Dial: [phone] °[email or web address]  °Website: [email or web address]  ° ° °

## 2019-05-02 ENCOUNTER — Other Ambulatory Visit: Payer: Self-pay | Admitting: Family Medicine

## 2019-05-02 ENCOUNTER — Other Ambulatory Visit: Payer: Self-pay | Admitting: Family

## 2019-05-02 ENCOUNTER — Telehealth: Payer: Self-pay | Admitting: Family

## 2019-05-02 DIAGNOSIS — F411 Generalized anxiety disorder: Secondary | ICD-10-CM

## 2019-05-02 MED ORDER — ALPRAZOLAM 1 MG PO TABS: 1.0000 mg | ORAL_TABLET | Freq: Two times a day (BID) | ORAL | 0 refills | Status: DC | PRN

## 2019-05-02 NOTE — Telephone Encounter (Signed)
What is the name of the medication? Xanax - Patient has one pill left  Have you contacted your pharmacy to request a refill? YES  Which pharmacy would you like this sent to? [facility]   Patient notified that their request is being sent to the clinical staff for review and that they should receive a call once it is complete. If they do not receive a call within 24 hours they can check with their pharmacy or our office.

## 2019-05-02 NOTE — Telephone Encounter (Signed)
Pt has appt on 05/06/19 with Christy Please advise

## 2019-05-02 NOTE — Telephone Encounter (Signed)
The Narcotic Database has been reviewed.  There were no red flags. Will give bridge supply of 1 week until seen by PCP.

## 2019-05-02 NOTE — Telephone Encounter (Signed)
Pt aware refill sent 

## 2019-05-03 ENCOUNTER — Other Ambulatory Visit: Payer: Self-pay

## 2019-05-06 ENCOUNTER — Encounter: Payer: Self-pay | Admitting: Family

## 2019-05-06 ENCOUNTER — Ambulatory Visit (INDEPENDENT_AMBULATORY_CARE_PROVIDER_SITE_OTHER): Payer: Medicare Other | Admitting: Family

## 2019-05-06 ENCOUNTER — Other Ambulatory Visit: Payer: Self-pay

## 2019-05-06 VITALS — BP 148/72 | HR 68 | Temp 96.8°F | Ht 68.0 in | Wt 126.0 lb

## 2019-05-06 DIAGNOSIS — F17200 Nicotine dependence, unspecified, uncomplicated: Secondary | ICD-10-CM | POA: Diagnosis not present

## 2019-05-06 DIAGNOSIS — F411 Generalized anxiety disorder: Secondary | ICD-10-CM | POA: Diagnosis not present

## 2019-05-06 DIAGNOSIS — E785 Hyperlipidemia, unspecified: Secondary | ICD-10-CM

## 2019-05-06 DIAGNOSIS — R0681 Apnea, not elsewhere classified: Secondary | ICD-10-CM

## 2019-05-06 DIAGNOSIS — I10 Essential (primary) hypertension: Secondary | ICD-10-CM

## 2019-05-06 DIAGNOSIS — K219 Gastro-esophageal reflux disease without esophagitis: Secondary | ICD-10-CM | POA: Diagnosis not present

## 2019-05-06 DIAGNOSIS — F1320 Sedative, hypnotic or anxiolytic dependence, uncomplicated: Secondary | ICD-10-CM

## 2019-05-06 DIAGNOSIS — Z79899 Other long term (current) drug therapy: Secondary | ICD-10-CM

## 2019-05-06 DIAGNOSIS — F1010 Alcohol abuse, uncomplicated: Secondary | ICD-10-CM

## 2019-05-06 MED ORDER — ALPRAZOLAM 1 MG PO TABS: 1.0000 mg | ORAL_TABLET | Freq: Two times a day (BID) | ORAL | 5 refills | Status: DC | PRN

## 2019-05-06 NOTE — Progress Notes (Signed)
Subjective:    Patient ID: [name], male    DOB: [birth year], 78 y.o.   MRN: [id number]  Chief Complaint  Patient presents with  . Medical Management of Chronic Issues   Pt presents to the office today for chronic follow up. PT is followed by Cardiologists for CAD, CHF, Atherosclerosis, Cardiomyopathy, and Ventricular tachycardia every 2 years. PT has pacemaker and is followed annually for this.   Pt is followed by Dermatologists forpsoriasis.  Pt reports that his daughter states he "stops breathing for a few seconds" while he is sleeping. He wants to have a sleep study.  Hypertension This is a chronic problem. The current episode started more than 1 year ago. The problem has been waxing and waning since onset. The problem is uncontrolled. Associated symptoms include anxiety and malaise/fatigue. Pertinent negatives include no peripheral edema or shortness of breath. Risk factors for coronary artery disease include dyslipidemia, male gender and sedentary lifestyle. The current treatment provides moderate improvement. Hypertensive end-organ damage includes CAD/MI. There is no history of kidney disease.  Gastroesophageal Reflux He complains of belching, heartburn and a hoarse voice. This is a chronic problem. The current episode started more than 1 year ago. The problem occurs rarely. The problem has been waxing and waning. The symptoms are aggravated by smoking. Risk factors include smoking/tobacco exposure. He has tried a PPI for the symptoms. The treatment provided moderate relief.  Nicotine Dependence Presents for follow-up visit. Symptoms include insomnia. His urge triggers include company of smokers. The symptoms have been stable. He smokes < 1/2 a pack of cigarettes per day.  Insomnia Primary symptoms: difficulty falling asleep, frequent awakening, malaise/fatigue.   Hyperlipidemia This is a chronic problem. The current episode started more than 1 year ago. Recent lipid  tests were reviewed and are normal. Pertinent negatives include no shortness of breath. Current antihyperlipidemic treatment includes statins. Risk factors for coronary artery disease include hypertension, male sex and dyslipidemia.  Anxiety Presents for follow-up visit. Symptoms include depressed mood, excessive worry, insomnia, nervous/anxious behavior and restlessness. Patient reports no shortness of breath. Symptoms occur most days.    Alcohol Abuse  He reports he drinks 3-4 beers a day. Unsure if this is an accurate report.     Review of Systems  Constitutional: Positive for malaise/fatigue.  HENT: Positive for hoarse voice.   Respiratory: Negative for shortness of breath.   Gastrointestinal: Positive for heartburn.  Psychiatric/Behavioral: The patient is nervous/anxious and has insomnia.   All other systems reviewed and are negative.      Objective:   Physical Exam Vitals signs reviewed.  Constitutional:      General: He is not in acute distress.    Appearance: He is well-developed.  HENT:     Head: Normocephalic.     Right Ear: Tympanic membrane normal.     Left Ear: Tympanic membrane normal.  Eyes:     General:        Right eye: No discharge.        Left eye: No discharge.     Pupils: Pupils are equal, round, and reactive to light.  Neck:     Musculoskeletal: Normal range of motion and neck supple.     Thyroid: No thyromegaly.  Cardiovascular:     Rate and Rhythm: Normal rate and regular rhythm.     Heart sounds: Normal heart sounds. No murmur.  Pulmonary:     Effort: Pulmonary effort is normal. No respiratory distress.     Breath  sounds: Normal breath sounds. No wheezing.  Abdominal:     General: Bowel sounds are normal. There is no distension.     Palpations: Abdomen is soft.     Tenderness: There is no abdominal tenderness.  Musculoskeletal: Normal range of motion.        General: No tenderness.  Skin:    General: Skin is warm and dry.     Findings: No  erythema or rash.  Neurological:     Mental Status: He is alert and oriented to person, place, and time.     Cranial Nerves: No cranial nerve deficit.     Deep Tendon Reflexes: Reflexes are normal and symmetric.  Psychiatric:        Behavior: Behavior normal.        Thought Content: Thought content normal.        Judgment: Judgment normal.       BP (!) 148/72 Comment: No meds this morning  Pulse 68   Temp (!) 96.8 F (36 C) (Temporal)   Ht '5\' 8"'  (1.727 m)   Wt 126 lb (57.2 kg)   SpO2 99%   BMI 19.16 kg/m      Assessment & Plan:  [name] comes in today with chief complaint of Medical Management of Chronic Issues   Diagnosis and orders addressed:  1. Essential hypertension - CMP14+EGFR - CBC with Differential/Platelet  2. Gastroesophageal reflux disease, unspecified whether esophagitis present - CMP14+EGFR - CBC with Differential/Platelet  3. TOBACCO ABUSE - CMP14+EGFR - CBC with Differential/Platelet  4. GAD (generalized anxiety disorder) Pt reviewed in [place] controlled database- No red flags - CMP14+EGFR - CBC with Differential/Platelet - ALPRAZolam (XANAX) 1 MG tablet; Take 1 tablet (1 mg total) by mouth 2 (two) times daily as needed. Keep appt w/ Alyse Low for refills  Dispense: 60 tablet; Refill: 5  5. Hyperlipidemia, unspecified hyperlipidemia type - CMP14+EGFR - CBC with Differential/Platelet - Lipid panel  6. Alcohol abuse - CMP14+EGFR - CBC with Differential/Platelet  7. Benzodiazepine dependence (HCC) - CMP14+EGFR - CBC with Differential/Platelet - DRUG SCREEN-TOXASSURE - ALPRAZolam (XANAX) 1 MG tablet; Take 1 tablet (1 mg total) by mouth 2 (two) times daily as needed. Keep appt w/ Alyse Low for refills  Dispense: 60 tablet; Refill: 5  8. Controlled substance agreement signed - CMP14+EGFR - CBC with Differential/Platelet - DRUG SCREEN-TOXASSURE - ALPRAZolam (XANAX) 1 MG tablet; Take 1 tablet (1 mg total) by mouth 2 (two) times daily as  needed. Keep appt w/ Alyse Low for refills  Dispense: 60 tablet; Refill: 5  9. Apnea spell - Ambulatory referral to Pulmonology - CMP14+EGFR - CBC with Differential/Platelet   Labs pending Health Maintenance reviewed Diet and exercise encouraged  Follow up plan: 6 months    Evelina Dun, FNP

## 2019-05-06 NOTE — Patient Instructions (Signed)

## 2019-05-07 LAB — CBC WITH DIFFERENTIAL/PLATELET
Basophils Absolute: 0.1 10*3/uL (ref 0.0–0.2)
Basos: 1 %
EOS (ABSOLUTE): 0.2 10*3/uL (ref 0.0–0.4)
Eos: 3 %
Hematocrit: 32.8 % — ABNORMAL LOW (ref 37.5–51.0)
Hemoglobin: 11.3 g/dL — ABNORMAL LOW (ref 13.0–17.7)
Immature Grans (Abs): 0 10*3/uL (ref 0.0–0.1)
Immature Granulocytes: 0 %
Lymphocytes Absolute: 1.8 10*3/uL (ref 0.7–3.1)
Lymphs: 23 %
MCH: 31.7 pg (ref 26.6–33.0)
MCHC: 34.5 g/dL (ref 31.5–35.7)
MCV: 92 fL (ref 79–97)
Monocytes Absolute: 1.1 10*3/uL — ABNORMAL HIGH (ref 0.1–0.9)
Monocytes: 14 %
Neutrophils Absolute: 4.4 10*3/uL (ref 1.4–7.0)
Neutrophils: 59 %
Platelets: 369 10*3/uL (ref 150–450)
RBC: 3.56 x10E6/uL — ABNORMAL LOW (ref 4.14–5.80)
RDW: 13.1 % (ref 11.6–15.4)
WBC: 7.5 10*3/uL (ref 3.4–10.8)

## 2019-05-07 LAB — CMP14+EGFR
ALT: 16 IU/L (ref 0–44)
AST: 28 IU/L (ref 0–40)
Albumin/Globulin Ratio: 1.5 (ref 1.2–2.2)
Albumin: 4.4 g/dL (ref 3.7–4.7)
Alkaline Phosphatase: 83 IU/L (ref 39–117)
BUN/Creatinine Ratio: 14 (ref 10–24)
BUN: 15 mg/dL (ref 8–27)
Bilirubin Total: 0.3 mg/dL (ref 0.0–1.2)
CO2: 22 mmol/L (ref 20–29)
Calcium: 9.8 mg/dL (ref 8.6–10.2)
Chloride: 93 mmol/L — ABNORMAL LOW (ref 96–106)
Creatinine, Ser: 1.11 mg/dL (ref 0.76–1.27)
GFR calc Af Amer: 75 mL/min/{1.73_m2} (ref >59)
GFR calc non Af Amer: 65 mL/min/{1.73_m2} (ref >59)
Globulin, Total: 2.9 g/dL (ref 1.5–4.5)
Glucose: 119 mg/dL — ABNORMAL HIGH (ref 65–99)
Potassium: 4.8 mmol/L (ref 3.5–5.2)
Sodium: 128 mmol/L — ABNORMAL LOW (ref 134–144)
Total Protein: 7.3 g/dL (ref 6.0–8.5)

## 2019-05-07 LAB — LIPID PANEL
Chol/HDL Ratio: 2.2 ratio (ref 0.0–5.0)
Cholesterol, Total: 195 mg/dL (ref 100–199)
HDL: 87 mg/dL (ref >39)
LDL Chol Calc (NIH): 98 mg/dL (ref 0–99)
Triglycerides: 52 mg/dL (ref 0–149)
VLDL Cholesterol Cal: 10 mg/dL (ref 5–40)

## 2019-05-13 ENCOUNTER — Ambulatory Visit (INDEPENDENT_AMBULATORY_CARE_PROVIDER_SITE_OTHER): Payer: Medicare Other | Admitting: *Deleted

## 2019-05-13 DIAGNOSIS — I5022 Chronic systolic (congestive) heart failure: Secondary | ICD-10-CM | POA: Diagnosis not present

## 2019-05-13 DIAGNOSIS — R0681 Apnea, not elsewhere classified: Secondary | ICD-10-CM

## 2019-05-13 DIAGNOSIS — I10 Essential (primary) hypertension: Secondary | ICD-10-CM

## 2019-05-13 NOTE — Chronic Care Management (AMB) (Addendum)
Chronic Care Management   Initial Visit Note  05/13/2019 Name: [name] MRN: [id number] DOB: 1940/07/29  Referred by: Sharion Balloon, FNP Reason for referral : Chronic Care Management (Initial Visit)   [name] is a 78 y.o. year old male who is a primary care patient of Sharion Balloon, FNP. The CCM team was consulted for assistance with chronic disease management and care coordination needs related to CHF, HTN and COPD  Review of patient status, including review of consultants reports, relevant laboratory and other test results, and collaboration with appropriate care team members and the patient's provider was performed as part of comprehensive patient evaluation and provision of chronic care management services.    SDOH (Social Determinants of Health) screening performed today: Tobacco Use Stress Physical Activity. See Care Plan for related entries.   SUBJECTIVE: I spoke with patient/daughter by telephone today. Overall she feels that he is managing his medical conditions well. She is concerned about the episodes of SOB or apneic events that he experiences several times a week. He has a pending pulmonary referral for this. He lives alone and she helps to provide his meals. He is minimally physically and socially active but she does check in on his 5 to 6 times a week.   OBJECTIVE: Outpatient Encounter Medications as of 05/13/2019  Medication Sig Note  . acitretin (SORIATANE) 25 MG capsule Take 25 mg by mouth daily.   Marland Kitchen albuterol (VENTOLIN HFA) 108 (90 Base) MCG/ACT inhaler Inhale 1 puff into the lungs every 6 (six) hours as needed for shortness of breath. 05/13/2019: Per daughter, patient is using 2 to 3 times a week for non-exertional SOB that wakes him up during the night.  . ALPRAZolam (XANAX) 1 MG tablet Take 1 tablet (1 mg total) by mouth 2 (two) times daily as needed. Keep appt w/ Alyse Low for refills   . amLODipine (NORVASC) 10 MG tablet Take 1 tablet (10 mg  total) by mouth daily.   Marland Kitchen aspirin 81 MG tablet Take 81 mg by mouth daily.   . cetirizine (ZYRTEC) 10 MG tablet Take 10 mg by mouth daily as needed for allergies.   Marland Kitchen lisinopril (ZESTRIL) 40 MG tablet Take 1 tablet (40 mg total) by mouth daily.   . metoprolol tartrate (LOPRESSOR) 100 MG tablet TAKE 1 TABLET BY MOUTH TWICE DAILY FOR HIGH BLOOD PRESSURE   . omeprazole (PRILOSEC) 20 MG capsule TAKE 1 CAPSULE BY MOUTH TWICE DAILY FOR  ACID  REFLUX   . pravastatin (PRAVACHOL) 40 MG tablet Take 1 tablet (40 mg total) by mouth daily.    No facility-administered encounter medications on file as of 05/13/2019.     BP Readings from Last 3 Encounters:  05/06/19 (!) 148/72  04/18/19 135/72  11/07/18 (!) 155/60     RN ASSESSMENT & CARE PLAN:   . Respiratory Disease Management (pt-stated)       Current Barriers:  Marland Kitchen Knowledge Deficits related to cause of apneic or SOB events at night . Chronic Disease Management support and education needs related to COPD vs other causes of SOB  Nurse Case Manager Clinical Goal(s):  Marland Kitchen Over the next 30 days, patient will work with Consulting civil engineer to address needs related to COPD  Interventions:  . Discussed current HPI with daughter o Patient reports SOB/apneic events during the night 2-3 times a week and will use albuterol inhaler which he feels helps . Evaluation of current treatment plan related to COPD and patient's adherence to  plan as established by provider. . Reviewed medications with patient and discussed albuterol . Chart reviewed. Discussed referral to pulmonary with daughter . Advised daughter to f/u with [facility] referral department if she has not gotten a call from pulmonary to schedule an appointment over the next week . Provided with CCM contact information and encouraged to reach out as needed  Patient Self Care Activities:  . Daughter helps with some ADLs like meal prep and most IADLs   Initial goal documentation       Other   . RN Initial  Goal        General Goal for Embedded Chronic Care Management Program  Current Barriers:  . Chronic Disease Management support and education needs related to HTN , CHF, COPD, GERD  Nurse Case Manager Clinical Goal(s):  Marland Kitchen Over the next 30 days, Mr. Favret will meet with the RN Care Manager to set goals related to the self-management of his chronic medical conditions.   Interventions:  . A review of relevant office notes, correspondence notes, imaging studies, lab reports, history and health care maintenance items was performed and pertinent items were discussed with the patient . Collaboration with PCP and appropriate care team members performed as necessary . Medications reviewed with the patient and reconciled . Intake Assessment, General Assessments, and Social Determinants of Health were completed (any needs identified are addressed in a separate goal) . CCM resources reviewed and team member roles explained. All questions were answered. . Patient provided with CCM contact information and recommended that he reach out with any chronic care management needs . Will mail printed materials with CCM consent information, CCM contact information, current goals, and follow-up plan  Patient Self Care Activities:  . Performs ADL's independently . Has help with IADLs from his daughter  Initial goal documentation        Follow-up Plan: RN will reach out by telephone over the next 3 weeks Patient will reach out to [facility] team as needed and will f/u with LCSW with any psychosocial needs   Chong Sicilian, BSN, RN-BC Embedded Chronic Care Manager [facility] [place] [facility] Direct Dial: [phone]     I have reviewed and agree with the above AWV documentation.   Evelina Dun, FNP

## 2019-05-13 NOTE — Patient Instructions (Signed)
RN ASSESSMENT & CARE PLAN:   . Respiratory Disease Management (pt-stated)       Current Barriers:  Marland Kitchen Knowledge Deficits related to cause of apneic or SOB events at night . Chronic Disease Management support and education needs related to COPD vs other causes of SOB  Nurse Case Manager Clinical Goal(s):  Marland Kitchen Over the next 30 days, patient will work with Consulting civil engineer to address needs related to COPD  Interventions:  . Discussed current HPI with daughter o Patient reports SOB/apneic events during the night 2-3 times a week and will use albuterol inhaler which he feels helps . Evaluation of current treatment plan related to COPD and patient's adherence to plan as established by provider. . Reviewed medications with patient and discussed albuterol . Chart reviewed. Discussed referral to pulmonary with daughter . Advised daughter to f/u with [facility] referral department if she has not gotten a call from pulmonary to schedule an appointment over the next week . Provided with CCM contact information and encouraged to reach out as needed  Patient Self Care Activities:  . Daughter helps with some ADLs like meal prep and most IADLs   Initial goal documentation       Other   . RN Initial Goal        General Goal for Embedded Chronic Care Management Program  Current Barriers:  . Chronic Disease Management support and education needs related to HTN , CHF, COPD, GERD  Nurse Case Manager Clinical Goal(s):  Marland Kitchen Over the next 30 days, [name] will meet with the RN Care Manager to set goals related to the self-management of his chronic medical conditions.   Interventions:  . A review of relevant office notes, correspondence notes, imaging studies, lab reports, history and health care maintenance items was performed and pertinent items were discussed with the patient . Collaboration with PCP and appropriate care team members performed as necessary . Medications reviewed with the patient and  reconciled . Intake Assessment, General Assessments, and Social Determinants of Health were completed (any needs identified are addressed in a separate goal) . CCM resources reviewed and team member roles explained. All questions were answered. . Patient provided with CCM contact information and recommended that he reach out with any chronic care management needs . Will mail printed materials with CCM consent information, CCM contact information, current goals, and follow-up plan  Patient Self Care Activities:  . Performs ADL's independently . Has help with IADLs from his daughter  Initial goal documentation        Follow-up Plan: RN will reach out by telephone over the next 3 weeks Patient will reach out to [facility] team as needed and will f/u with LCSW with any psychosocial needs  Chong Sicilian, BSN, RN-BC [place] / [place]: [phone]    [name] was given information about Chronic Care Management services today including:  1. CCM service includes personalized support from designated clinical staff supervised by his physician, including individualized plan of care and coordination with other care providers 2. 24/7 contact phone numbers for assistance for urgent and routine care needs. 3. Service will only be billed when office clinical staff spend 20 minutes or more in a month to coordinate care. 4. Only one practitioner may furnish and bill the service in a calendar month. 5. The patient may stop CCM services at any time (effective at the end of the month) by phone call to the office  staff. 6. The patient will be responsible for cost sharing (co-pay) of up to 20% of the service fee (after annual deductible is met).  Patient agreed to services and verbal consent obtained.

## 2019-05-29 NOTE — Progress Notes (Signed)
Remote ICD transmission.   

## 2019-06-02 ENCOUNTER — Other Ambulatory Visit: Payer: Self-pay | Admitting: Family

## 2019-06-02 DIAGNOSIS — I10 Essential (primary) hypertension: Secondary | ICD-10-CM

## 2019-06-02 DIAGNOSIS — E785 Hyperlipidemia, unspecified: Secondary | ICD-10-CM

## 2019-07-06 ENCOUNTER — Other Ambulatory Visit: Payer: Self-pay | Admitting: Family

## 2019-07-06 DIAGNOSIS — I10 Essential (primary) hypertension: Secondary | ICD-10-CM

## 2019-07-29 ENCOUNTER — Ambulatory Visit (INDEPENDENT_AMBULATORY_CARE_PROVIDER_SITE_OTHER): Payer: Medicare Other | Admitting: *Deleted

## 2019-07-29 DIAGNOSIS — I428 Other cardiomyopathies: Secondary | ICD-10-CM

## 2019-07-29 LAB — CUP PACEART REMOTE DEVICE CHECK
Battery Remaining Longevity: 54 mo
Battery Remaining Percentage: 84 %
Brady Statistic RA Percent Paced: 0 %
Brady Statistic RV Percent Paced: 100 %
Date Time Interrogation Session: 20210208003200
HighPow Impedance: 59 Ohm
Implantable Lead Implant Date: 20040312
Implantable Lead Implant Date: 20040312
Implantable Lead Implant Date: 20040312
Implantable Lead Location: 753858
Implantable Lead Location: 753859
Implantable Lead Location: 753860
Implantable Lead Model: 158
Implantable Lead Model: 4087
Implantable Lead Model: 4513
Implantable Lead Serial Number: 129158
Implantable Lead Serial Number: 209446
Implantable Lead Serial Number: 407264
Implantable Pulse Generator Implant Date: 20150917
Lead Channel Impedance Value: 557 Ohm
Lead Channel Impedance Value: 612 Ohm
Lead Channel Impedance Value: 896 Ohm
Lead Channel Pacing Threshold Amplitude: 0.6 V
Lead Channel Pacing Threshold Amplitude: 1 V
Lead Channel Pacing Threshold Amplitude: 1.9 V
Lead Channel Pacing Threshold Pulse Width: 0.4 ms
Lead Channel Pacing Threshold Pulse Width: 0.4 ms
Lead Channel Pacing Threshold Pulse Width: 0.8 ms
Lead Channel Setting Pacing Amplitude: 2 V
Lead Channel Setting Pacing Amplitude: 2.5 V
Lead Channel Setting Pacing Amplitude: 3 V
Lead Channel Setting Pacing Pulse Width: 0.4 ms
Lead Channel Setting Pacing Pulse Width: 0.8 ms
Lead Channel Setting Sensing Sensitivity: 0.6 mV
Lead Channel Setting Sensing Sensitivity: 1 mV
Pulse Gen Serial Number: 106298

## 2019-07-30 NOTE — Progress Notes (Signed)
ICD Remote  

## 2019-08-16 ENCOUNTER — Other Ambulatory Visit: Payer: Self-pay

## 2019-08-19 ENCOUNTER — Ambulatory Visit: Payer: Medicare Other | Admitting: Family

## 2019-08-20 ENCOUNTER — Telehealth: Payer: Self-pay | Admitting: Internal Medicine

## 2019-08-20 NOTE — Telephone Encounter (Signed)
Virtual Visit Pre-Appointment Phone Call  "(Name), I am calling you today to discuss your upcoming appointment. We are currently trying to limit exposure to the virus that causes COVID-19 by seeing patients at home rather than in the office."  1. "What is the BEST phone number to call the day of the visit?" - [phone] 2.   3. "Do you have or have access to (through a family member/friend) a smartphone with video capability that we can use for your visit?" a. If yes - list this number in appt notes as "cell" (if different from BEST phone #) and list the appointment type as a VIDEO visit in appointment notes b. If no - list the appointment type as a PHONE visit in appointment notes  4. Confirm consent - "In the setting of the current Covid19 crisis, you are scheduled for a (phone or video) visit with your provider on (date) at (time).  Just as we do with many in-office visits, in order for you to participate in this visit, we must obtain consent.  If you'd like, I can send this to your mychart (if signed up) or email for you to review.  Otherwise, I can obtain your verbal consent now.  All virtual visits are billed to your insurance company just like a normal visit would be.  By agreeing to a virtual visit, we'd like you to understand that the technology does not allow for your provider to perform an examination, and thus may limit your provider's ability to fully assess your condition. If your provider identifies any concerns that need to be evaluated in person, we will make arrangements to do so.  Finally, though the technology is pretty good, we cannot assure that it will always work on either your or our end, and in the setting of a video visit, we may have to convert it to a phone-only visit.  In either situation, we cannot ensure that we have a secure connection.  Are you willing to proceed?" STAFF: Did the patient verbally acknowledge consent to telehealth visit? Document YES/NO here: yes     5. Advise patient to be prepared - "Two hours prior to your appointment, go ahead and check your blood pressure, pulse, oxygen saturation, and your weight (if you have the equipment to check those) and write them all down. When your visit starts, your provider will ask you for this information. If you have an Apple Watch or Kardia device, please plan to have heart rate information ready on the day of your appointment. Please have a pen and paper handy nearby the day of the visit as well."  6. Give patient instructions for MyChart download to smartphone OR Doximity/Doxy.me as below if video visit (depending on what platform provider is using)  7. Inform patient they will receive a phone call 15 minutes prior to their appointment time (may be from unknown caller ID) so they should be prepared to answer    TELEPHONE CALL NOTE  [name] has been deemed a candidate for a follow-up tele-health visit to limit community exposure during the Covid-19 pandemic. I spoke with the patient via phone to ensure availability of phone/video source, confirm preferred email & phone number, and discuss instructions and expectations.  I reminded [name] to be prepared with any vital sign and/or heart rhythm information that could potentially be obtained via home monitoring, at the time of his visit. I reminded [name] to expect a phone call prior to  his visit.  [name] 08/20/2019 11:29 AM   INSTRUCTIONS FOR DOWNLOADING THE MYCHART APP TO SMARTPHONE  - The patient must first make sure to have activated MyChart and know their login information - If Apple, go to [employer] and type in MyChart in the search bar and download the app. If Android, ask patient to go to [employer] and type in [place] in the search bar and download the app. The app is free but as with any other app downloads, their phone may require them to verify saved payment information or Apple/Android password.   - The patient will need to then log into the app with their MyChart username and password, and select [facility] as their healthcare provider to link the account. When it is time for your visit, go to the MyChart app, find appointments, and click Begin Video Visit. Be sure to Select Allow for your device to access the Microphone and Camera for your visit. You will then be connected, and your provider will be with you shortly.  **If they have any issues connecting, or need assistance please contact MyChart service desk [phone])**  **If using a computer, in order to ensure the best quality for their visit they will need to use either of the following Internet Browsers: [employer], or Google Chrome**  IF USING DOXIMITY or DOXY.ME - The patient will receive a link just prior to their visit by text.     FULL LENGTH CONSENT FOR TELE-HEALTH VISIT   I hereby voluntarily request, consent and authorize [facility] and its employed or contracted physicians, physician assistants, nurse practitioners or other licensed health care professionals (the Practitioner), to provide me with telemedicine health care services (the "Services") as deemed necessary by the treating Practitioner. I acknowledge and consent to receive the Services by the Practitioner via telemedicine. I understand that the telemedicine visit will involve communicating with the Practitioner through live audiovisual communication technology and the disclosure of certain medical information by electronic transmission. I acknowledge that I have been given the opportunity to request an in-person assessment or other available alternative prior to the telemedicine visit and am voluntarily participating in the telemedicine visit.  I understand that I have the right to withhold or withdraw my consent to the use of telemedicine in the course of my care at any time, without affecting my right to future care or treatment, and that  the Practitioner or I may terminate the telemedicine visit at any time. I understand that I have the right to inspect all information obtained and/or recorded in the course of the telemedicine visit and may receive copies of available information for a reasonable fee.  I understand that some of the potential risks of receiving the Services via telemedicine include:  Marland Kitchen Delay or interruption in medical evaluation due to technological equipment failure or disruption; . Information transmitted may not be sufficient (e.g. poor resolution of images) to allow for appropriate medical decision making by the Practitioner; and/or  . In rare instances, security protocols could fail, causing a breach of personal health information.  Furthermore, I acknowledge that it is my responsibility to provide information about my medical history, conditions and care that is complete and accurate to the best of my ability. I acknowledge that Practitioner's advice, recommendations, and/or decision may be based on factors not within their control, such as incomplete or inaccurate data provided by me or distortions of diagnostic images or specimens that may result from electronic transmissions. I  understand that the practice of medicine is not an exact science and that Practitioner makes no warranties or guarantees regarding treatment outcomes. I acknowledge that I will receive a copy of this consent concurrently upon execution via email to the email address I last provided but may also request a printed copy by calling the office of [facility].    I understand that my insurance will be billed for this visit.   I have read or had this consent read to me. . I understand the contents of this consent, which adequately explains the benefits and risks of the Services being provided via telemedicine.  . I have been provided ample opportunity to ask questions regarding this consent and the Services and have had my questions answered to  my satisfaction. . I give my informed consent for the services to be provided through the use of telemedicine in my medical care  By participating in this telemedicine visit I agree to the above.

## 2019-08-21 ENCOUNTER — Encounter: Payer: Self-pay | Admitting: Family

## 2019-08-23 ENCOUNTER — Encounter: Payer: Self-pay | Admitting: Internal Medicine

## 2019-08-23 ENCOUNTER — Telehealth (INDEPENDENT_AMBULATORY_CARE_PROVIDER_SITE_OTHER): Payer: Medicare Other | Admitting: Internal Medicine

## 2019-08-23 VITALS — Ht 67.0 in | Wt 130.0 lb

## 2019-08-23 DIAGNOSIS — E785 Hyperlipidemia, unspecified: Secondary | ICD-10-CM

## 2019-08-23 DIAGNOSIS — I428 Other cardiomyopathies: Secondary | ICD-10-CM

## 2019-08-23 DIAGNOSIS — I5022 Chronic systolic (congestive) heart failure: Secondary | ICD-10-CM | POA: Diagnosis not present

## 2019-08-23 DIAGNOSIS — I10 Essential (primary) hypertension: Secondary | ICD-10-CM

## 2019-08-23 MED ORDER — PRAVASTATIN SODIUM 40 MG PO TABS: 40.0000 mg | ORAL_TABLET | Freq: Every day | ORAL | 3 refills | Status: DC

## 2019-08-23 MED ORDER — METOPROLOL TARTRATE 100 MG PO TABS: 100.0000 mg | ORAL_TABLET | Freq: Two times a day (BID) | ORAL | 3 refills | Status: DC

## 2019-08-23 MED ORDER — LISINOPRIL 40 MG PO TABS: 40.0000 mg | ORAL_TABLET | Freq: Every day | ORAL | 3 refills | Status: DC

## 2019-08-23 MED ORDER — AMLODIPINE BESYLATE 10 MG PO TABS: 10.0000 mg | ORAL_TABLET | Freq: Every day | ORAL | 3 refills | Status: DC

## 2019-08-23 NOTE — Progress Notes (Signed)
Electrophysiology TeleHealth Note  Due to national recommendations of social distancing due to [place] 19, an audio telehealth visit is felt to be most appropriate for this patient at this time.  Verbal consent was obtained by me for the telehealth visit today.  The patient does not have capability for a virtual visit.  A phone visit is therefore required today.   Date:  08/23/2019   ID:  [name], DOB [birth year], MRN [id number]  Location: patient's home  Provider location:  [place]  Evaluation Performed: Follow-up visit  PCP:  Sharion Balloon, FNP   Electrophysiologist:  Dr Rayann Heman  Chief Complaint:  palpitations  History of Present Illness:    [name] is a 79 y.o. male who presents via telehealth conferencing today.  Since last being seen in our clinic, the patient reports doing very well.  Today, he denies symptoms of palpitations, chest pain, shortness of breath,  lower extremity edema, dizziness, presyncope, or syncope.  The patient is otherwise without complaint today.  The patient denies symptoms of fevers, chills, cough, or new SOB worrisome for COVID 19.  Past Medical History:  Diagnosis Date  . Anxiety   . Arthritis   . CAD (coronary artery disease)    Nonobstructive cath 2012.  PCI to RCA 2003  . Carotid artery disease ([place])   . Cataract   . CHF (congestive heart failure) ([place])   . COPD (chronic obstructive pulmonary disease) ([place])   . Depression   . Dyslipidemia   . EtOH dependence ([place])   . GERD (gastroesophageal reflux disease)   . Hyperlipidemia   . Hypertension   . Insomnia   . Nonischemic cardiomyopathy (H[facility])    EF was 20% (60% last echo 2015)  . Pacemaker    and defibrillator  . Peptic ulcer disease   . Tobacco abuse     Past Surgical History:  Procedure Laterality Date  . BIV ICD GENERTAOR CHANGE OUT N/A 03/06/2014   [employer] Gen change by Dr Rayann Heman Beckie Salts CRT-D with LV1 header)  . biventricular defibrillator  implantation     [employer] and pacemaker  . CATARACT EXTRACTION W/PHACO Right 12/10/2015   Procedure: CATARACT EXTRACTION PHACO AND INTRAOCULAR LENS PLACEMENT RIGHT EYE; CDE: 19.30;  Surgeon: Tonny Branch, MD;  Location: [facility];  Service: Ophthalmology;  Laterality: Right;  . CATARACT EXTRACTION W/PHACO Left 01/14/2016   Procedure: CATARACT EXTRACTION PHACO AND INTRAOCULAR LENS PLACEMENT LEFT EYE; CDE:  8.03;  Surgeon: Tonny Branch, MD;  Location: [facility];  Service: Ophthalmology;  Laterality: Left;  . COLONOSCOPY    . EYE SURGERY    . Ulcer surgery     repair of stomach ulcer    Current Outpatient Medications  Medication Sig Dispense Refill  . acitretin (SORIATANE) 25 MG capsule Take 25 mg by mouth daily.    Marland Kitchen albuterol (VENTOLIN HFA) 108 (90 Base) MCG/ACT inhaler Inhale 1 puff into the lungs every 6 (six) hours as needed for shortness of breath.    . ALPRAZolam (XANAX) 1 MG tablet Take 1 tablet (1 mg total) by mouth 2 (two) times daily as needed. Keep appt w/ Alyse Low for refills 60 tablet 5  . amLODipine (NORVASC) 10 MG tablet Take 1 tablet (10 mg total) by mouth daily. 90 tablet 2  . aspirin 81 MG tablet Take 81 mg by mouth daily.    . cetirizine (ZYRTEC) 10 MG tablet Take 10 mg by mouth daily as needed for allergies.    Marland Kitchen  cosyntropin (CORTROSYN) 0.25 MG injection Inject 0.5 mg into the vein every 30 (thirty) days.    Marland Kitchen lisinopril (ZESTRIL) 40 MG tablet Take 1 tablet by mouth once daily 90 tablet 0  . metoprolol tartrate (LOPRESSOR) 100 MG tablet TAKE 1 TABLET BY MOUTH TWICE DAILY FOR HIGH BLOOD PRESSURE 180 tablet 0  . omeprazole (PRILOSEC) 20 MG capsule TAKE 1 CAPSULE BY MOUTH TWICE DAILY FOR  ACID  REFLUX 180 capsule 2  . pravastatin (PRAVACHOL) 40 MG tablet Take 1 tablet by mouth once daily 90 tablet 0   No current facility-administered medications for this visit.    Allergies:   Sudafed [pseudoephedrine hcl], Prednisone, and Avelox [moxifloxacin hcl in nacl]   Social History:   The patient  reports that he has been smoking cigarettes. He has a 11.25 pack-year smoking history. He quit smokeless tobacco use about 30 years ago.  His smokeless tobacco use included chew. He reports current alcohol use of about 6.0 standard drinks of alcohol per week. He reports that he does not use drugs.   Family History:  The patient's family history includes Arthritis/Rheumatoid in his daughter; Cancer in his brother; Colon polyps in his sister; GI Bleed in his brother and sister; Heart attack in his brother; Heart disease in his brother and brother; Heart failure in his father; Psoriasis in his daughter.   ROS:  Please see the history of present illness.   All other systems are personally reviewed and negative.    Exam:    Vital Signs:  Ht 5\' 7"  (1.702 m)   Wt 130 lb (59 kg)   BMI 20.36 kg/m   Well sounding, alert and conversant   Labs/Other Tests and Data Reviewed:    Recent Labs: 05/06/2019: ALT 16; BUN 15; Creatinine, Ser 1.11; Hemoglobin 11.3; Platelets 369; Potassium 4.8; Sodium 128   Wt Readings from Last 3 Encounters:  08/23/19 130 lb (59 kg)  05/06/19 126 lb (57.2 kg)  04/18/19 130 lb (59 kg)     Last device remote is reviewed from [facility] PDF which reveals normal device function, no arrhythmias    ASSESSMENT & PLAN:    1.  Chronic systolic dysfunction/ Nonischemic CM/ VT No CHF symptoms currently  EF has normalized with CRT Doing well Remotes are up to date Normal BiV ICD function He has a remote history of VT but no sustained arrhythmias in years  2. HTN Stable No change required today  3. HL Continue pravachol  Follow-up:  with me in a year   Patient Risk:  after full review of this patients clinical status, I feel that they are at moderate risk at this time.  Today, I have spent 15 minutes with the patient with telehealth technology discussing arrhythmia management .    Army Fossa, MD  08/23/2019 9:01 AM     [facility] [address] [place] [address] [phone] (office) [phone] (fax)

## 2019-08-29 DIAGNOSIS — L409 Psoriasis, unspecified: Secondary | ICD-10-CM | POA: Diagnosis not present

## 2019-10-05 ENCOUNTER — Other Ambulatory Visit: Payer: Self-pay | Admitting: Family

## 2019-10-05 DIAGNOSIS — I10 Essential (primary) hypertension: Secondary | ICD-10-CM

## 2019-10-08 ENCOUNTER — Ambulatory Visit: Payer: Medicare Other | Admitting: Dermatology

## 2019-10-15 ENCOUNTER — Ambulatory Visit (INDEPENDENT_AMBULATORY_CARE_PROVIDER_SITE_OTHER): Payer: Medicare Other | Admitting: Family

## 2019-10-15 ENCOUNTER — Encounter: Payer: Self-pay | Admitting: Family

## 2019-10-15 ENCOUNTER — Other Ambulatory Visit: Payer: Self-pay

## 2019-10-15 VITALS — BP 138/73 | HR 85 | Temp 97.8°F | Ht 67.0 in | Wt 130.8 lb

## 2019-10-15 DIAGNOSIS — I25119 Atherosclerotic heart disease of native coronary artery with unspecified angina pectoris: Secondary | ICD-10-CM

## 2019-10-15 DIAGNOSIS — Z79899 Other long term (current) drug therapy: Secondary | ICD-10-CM | POA: Diagnosis not present

## 2019-10-15 DIAGNOSIS — E785 Hyperlipidemia, unspecified: Secondary | ICD-10-CM

## 2019-10-15 DIAGNOSIS — I10 Essential (primary) hypertension: Secondary | ICD-10-CM | POA: Diagnosis not present

## 2019-10-15 DIAGNOSIS — G621 Alcoholic polyneuropathy: Secondary | ICD-10-CM | POA: Insufficient documentation

## 2019-10-15 DIAGNOSIS — F411 Generalized anxiety disorder: Secondary | ICD-10-CM | POA: Diagnosis not present

## 2019-10-15 DIAGNOSIS — F1320 Sedative, hypnotic or anxiolytic dependence, uncomplicated: Secondary | ICD-10-CM

## 2019-10-15 DIAGNOSIS — I5022 Chronic systolic (congestive) heart failure: Secondary | ICD-10-CM

## 2019-10-15 DIAGNOSIS — F1010 Alcohol abuse, uncomplicated: Secondary | ICD-10-CM

## 2019-10-15 DIAGNOSIS — F17200 Nicotine dependence, unspecified, uncomplicated: Secondary | ICD-10-CM

## 2019-10-15 DIAGNOSIS — G4700 Insomnia, unspecified: Secondary | ICD-10-CM

## 2019-10-15 DIAGNOSIS — R0681 Apnea, not elsewhere classified: Secondary | ICD-10-CM

## 2019-10-15 DIAGNOSIS — L409 Psoriasis, unspecified: Secondary | ICD-10-CM

## 2019-10-15 DIAGNOSIS — K219 Gastro-esophageal reflux disease without esophagitis: Secondary | ICD-10-CM

## 2019-10-15 DIAGNOSIS — I700 Atherosclerosis of aorta: Secondary | ICD-10-CM

## 2019-10-15 LAB — CMP14+EGFR
ALT: 16 IU/L (ref 0–44)
AST: 29 IU/L (ref 0–40)
Albumin/Globulin Ratio: 1.5 (ref 1.2–2.2)
Albumin: 4.4 g/dL (ref 3.7–4.7)
Alkaline Phosphatase: 91 IU/L (ref 39–117)
BUN/Creatinine Ratio: 19 (ref 10–24)
BUN: 22 mg/dL (ref 8–27)
Bilirubin Total: 0.3 mg/dL (ref 0.0–1.2)
CO2: 21 mmol/L (ref 20–29)
Calcium: 9.6 mg/dL (ref 8.6–10.2)
Chloride: 92 mmol/L — ABNORMAL LOW (ref 96–106)
Creatinine, Ser: 1.15 mg/dL (ref 0.76–1.27)
GFR calc Af Amer: 71 mL/min/{1.73_m2} (ref >59)
GFR calc non Af Amer: 61 mL/min/{1.73_m2} (ref >59)
Globulin, Total: 2.9 g/dL (ref 1.5–4.5)
Glucose: 72 mg/dL (ref 65–99)
Potassium: 5.1 mmol/L (ref 3.5–5.2)
Sodium: 126 mmol/L — ABNORMAL LOW (ref 134–144)
Total Protein: 7.3 g/dL (ref 6.0–8.5)

## 2019-10-15 LAB — CBC WITH DIFFERENTIAL/PLATELET
Basophils Absolute: 0.1 10*3/uL (ref 0.0–0.2)
Basos: 1 %
EOS (ABSOLUTE): 0.2 10*3/uL (ref 0.0–0.4)
Eos: 3 %
Hematocrit: 33.8 % — ABNORMAL LOW (ref 37.5–51.0)
Hemoglobin: 11.3 g/dL — ABNORMAL LOW (ref 13.0–17.7)
Immature Grans (Abs): 0 10*3/uL (ref 0.0–0.1)
Immature Granulocytes: 0 %
Lymphocytes Absolute: 2 10*3/uL (ref 0.7–3.1)
Lymphs: 23 %
MCH: 30.6 pg (ref 26.6–33.0)
MCHC: 33.4 g/dL (ref 31.5–35.7)
MCV: 92 fL (ref 79–97)
Monocytes Absolute: 1.2 10*3/uL — ABNORMAL HIGH (ref 0.1–0.9)
Monocytes: 14 %
Neutrophils Absolute: 5.3 10*3/uL (ref 1.4–7.0)
Neutrophils: 59 %
Platelets: 353 10*3/uL (ref 150–450)
RBC: 3.69 x10E6/uL — ABNORMAL LOW (ref 4.14–5.80)
RDW: 14.1 % (ref 11.6–15.4)
WBC: 8.9 10*3/uL (ref 3.4–10.8)

## 2019-10-15 MED ORDER — ALBUTEROL SULFATE HFA 108 (90 BASE) MCG/ACT IN AERS: 1.0000 | INHALATION_SPRAY | Freq: Four times a day (QID) | RESPIRATORY_TRACT | 2 refills | Status: DC | PRN

## 2019-10-15 MED ORDER — PRAVASTATIN SODIUM 40 MG PO TABS: 40.0000 mg | ORAL_TABLET | Freq: Every day | ORAL | 3 refills | Status: DC

## 2019-10-15 MED ORDER — LISINOPRIL 40 MG PO TABS: 40.0000 mg | ORAL_TABLET | Freq: Every day | ORAL | 0 refills | Status: DC

## 2019-10-15 MED ORDER — ALPRAZOLAM 1 MG PO TABS: 1.0000 mg | ORAL_TABLET | Freq: Two times a day (BID) | ORAL | 5 refills | Status: DC | PRN

## 2019-10-15 MED ORDER — METOPROLOL TARTRATE 100 MG PO TABS: 100.0000 mg | ORAL_TABLET | Freq: Two times a day (BID) | ORAL | 3 refills | Status: DC

## 2019-10-15 MED ORDER — GABAPENTIN 100 MG PO CAPS: 100.0000 mg | ORAL_CAPSULE | Freq: Three times a day (TID) | ORAL | 3 refills | Status: DC

## 2019-10-15 MED ORDER — AMLODIPINE BESYLATE 10 MG PO TABS: 10.0000 mg | ORAL_TABLET | Freq: Every day | ORAL | 3 refills | Status: DC

## 2019-10-15 MED ORDER — PANTOPRAZOLE SODIUM 40 MG PO TBEC: 40.0000 mg | DELAYED_RELEASE_TABLET | Freq: Every day | ORAL | 3 refills | Status: DC

## 2019-10-15 MED ORDER — OMEPRAZOLE 20 MG PO CPDR: DELAYED_RELEASE_CAPSULE | ORAL | 2 refills | Status: DC

## 2019-10-15 NOTE — Patient Instructions (Signed)
Neuropathic Pain Neuropathic pain is pain caused by damage to the nerves that are responsible for certain sensations in your body (sensory nerves). The pain can be caused by:  Damage to the sensory nerves that send signals to your spinal cord and brain (peripheral nervous system).  Damage to the sensory nerves in your brain or spinal cord (central nervous system). Neuropathic pain can make you more sensitive to pain. Even a minor sensation can feel very painful. This is usually a long-term condition that can be difficult to treat. The type of pain differs from person to person. It may:  Start suddenly (acute), or it may develop slowly and last for a long time (chronic).  Come and go as damaged nerves heal, or it may stay at the same level for years.  Cause emotional distress, loss of sleep, and a lower quality of life. What are the causes? The most common cause of this condition is diabetes. Many other diseases and conditions can also cause neuropathic pain. Causes of neuropathic pain can be classified as:  Toxic. This is caused by medicines and chemicals. The most common cause of toxic neuropathic pain is damage from cancer treatments (chemotherapy).  Metabolic. This can be caused by: ? Diabetes. This is the most common disease that damages the nerves. ? Lack of vitamin B from long-term alcohol abuse.  Traumatic. Any injury that cuts, crushes, or stretches a nerve can cause damage and pain. A common example is feeling pain after losing an arm or leg (phantom limb pain).  Compression-related. If a sensory nerve gets trapped or compressed for a long period of time, the blood supply to the nerve can be cut off.  Vascular. Many blood vessel diseases can cause neuropathic pain by decreasing blood supply and oxygen to nerves.  Autoimmune. This type of pain results from diseases in which the body's defense system (immune system) mistakenly attacks sensory nerves. Examples of autoimmune diseases  that can cause neuropathic pain include lupus and multiple sclerosis.  Infectious. Many types of viral infections can damage sensory nerves and cause pain. Shingles infection is a common cause of this type of pain.  Inherited. Neuropathic pain can be a symptom of many diseases that are passed down through families (genetic). What increases the risk? You are more likely to develop this condition if:  You have diabetes.  You smoke.  You drink too much alcohol.  You are taking certain medicines, including medicines that kill cancer cells (chemotherapy) or that treat immune system disorders. What are the signs or symptoms? The main symptom is pain. Neuropathic pain is often described as:  Burning.  Shock-like.  Stinging.  Hot or cold.  Itching. How is this diagnosed? No single test can diagnose neuropathic pain. It is diagnosed based on:  Physical exam and your symptoms. Your health care provider will ask you about your pain. You may be asked to use a pain scale to describe how bad your pain is.  Tests. These may be done to see if you have a high sensitivity to pain and to help find the cause and location of any sensory nerve damage. They include: ? Nerve conduction studies to test how well nerve signals travel through your sensory nerves (electrodiagnostic testing). ? Stimulating your sensory nerves through electrodes on your skin and measuring the response in your spinal cord and brain (somatosensory evoked potential).  Imaging studies, such as: ? X-rays. ? CT scan. ? MRI. How is this treated? Treatment for neuropathic pain may change   over time. You may need to try different treatment options or a combination of treatments. Some options include:  Treating the underlying cause of the neuropathy, such as diabetes, kidney disease, or vitamin deficiencies.  Stopping medicines that can cause neuropathy, such as chemotherapy.  Medicine to relieve pain. Medicines may  include: ? Prescription or over-the-counter pain medicine. ? Anti-seizure medicine. ? Antidepressant medicines. ? Pain-relieving patches that are applied to painful areas of skin. ? A medicine to numb the area (local anesthetic), which can be injected as a nerve block.  Transcutaneous nerve stimulation. This uses electrical currents to block painful nerve signals. The treatment is painless.  Alternative treatments, such as: ? Acupuncture. ? Meditation. ? Massage. ? Physical therapy. ? Pain management programs. ? Counseling. Follow these instructions at home: Medicines   Take over-the-counter and prescription medicines only as told by your health care provider.  Do not drive or use heavy machinery while taking prescription pain medicine.  If you are taking prescription pain medicine, take actions to prevent or treat constipation. Your health care provider may recommend that you: ? Drink enough fluid to keep your urine pale yellow. ? Eat foods that are high in fiber, such as fresh fruits and vegetables, whole grains, and beans. ? Limit foods that are high in fat and processed sugars, such as fried or sweet foods. ? Take an over-the-counter or prescription medicine for constipation. Lifestyle   Have a good support system at home.  Consider joining a chronic pain support group.  Do not use any products that contain nicotine or tobacco, such as cigarettes and e-cigarettes. If you need help quitting, ask your health care provider.  Do not drink alcohol. General instructions  Learn as much as you can about your condition.  Work closely with all your health care providers to find the treatment plan that works best for you.  Ask your health care provider what activities are safe for you.  Keep all follow-up visits as told by your health care provider. This is important. Contact a health care provider if:  Your pain treatments are not working.  You are having side effects  from your medicines.  You are struggling with tiredness (fatigue), mood changes, depression, or anxiety. Summary  Neuropathic pain is pain caused by damage to the nerves that are responsible for certain sensations in your body (sensory nerves).  Neuropathic pain may come and go as damaged nerves heal, or it may stay at the same level for years.  Neuropathic pain is usually a long-term condition that can be difficult to treat. Consider joining a chronic pain support group. This information is not intended to replace advice given to you by your health care provider. Make sure you discuss any questions you have with your health care provider. Document Revised: 09/27/2018 Document Reviewed: 06/23/2017 Elsevier Patient Education  2020 [employer]  

## 2019-10-15 NOTE — Progress Notes (Signed)
Subjective:    Patient ID: [name], male    DOB: 02-13-1941, 79 y.o.   MRN: [id number]  Chief Complaint  Patient presents with  . Medical Management of Chronic Issues    Check on referral for sleep study, needs new referral for [facility], handicap form    Pt presents to the office today for chronic follow up. PT is followed by Cardiologists for CAD, CHF, Atherosclerosis, Cardiomyopathy, and Ventricular tachycardia every 2 years. PT has pacemaker and is followed annually for this.   Pt is followed by Dermatologists forpsoriasis. He wants second opinion.    Pt reports he never heard anything back from the sleep study.   He reports aching pain in bilateral feet pain of 8 out 10 that is worse when he walks. He does report drinking 6 beers a day.  Hypertension This is a chronic problem. The current episode started more than 1 year ago. The problem has been resolved since onset. The problem is controlled. Associated symptoms include anxiety and malaise/fatigue. Pertinent negatives include no peripheral edema or shortness of breath. Risk factors for coronary artery disease include dyslipidemia, obesity, male gender, sedentary lifestyle and smoking/tobacco exposure. The current treatment provides moderate improvement.  Gastroesophageal Reflux He complains of belching and heartburn. This is a chronic problem. The current episode started more than 1 year ago. The problem occurs frequently. The problem has been waxing and waning. The symptoms are aggravated by certain foods. Risk factors include smoking/tobacco exposure. He has tried a PPI for the symptoms. The treatment provided mild relief.  Hyperlipidemia This is a chronic problem. The current episode started more than 1 year ago. Pertinent negatives include no shortness of breath. Current antihyperlipidemic treatment includes statins. The current treatment provides moderate improvement of lipids. Risk factors for coronary artery disease  include dyslipidemia, male sex, hypertension and a sedentary lifestyle.  Anxiety Presents for follow-up visit. Symptoms include depressed mood, excessive worry, irritability, nervous/anxious behavior and restlessness. Patient reports no shortness of breath. Symptoms occur occasionally. The severity of symptoms is moderate.        Review of Systems  Constitutional: Positive for irritability and malaise/fatigue.  Respiratory: Negative for shortness of breath.   Gastrointestinal: Positive for heartburn.  Psychiatric/Behavioral: The patient is nervous/anxious.   All other systems reviewed and are negative.      Objective:   Physical Exam Vitals reviewed.  Constitutional:      General: He is not in acute distress.    Appearance: He is well-developed.  HENT:     Head: Normocephalic.     Right Ear: Tympanic membrane normal.     Left Ear: Tympanic membrane normal.  Eyes:     General:        Right eye: No discharge.        Left eye: No discharge.     Pupils: Pupils are equal, round, and reactive to light.  Neck:     Thyroid: No thyromegaly.  Cardiovascular:     Rate and Rhythm: Normal rate and regular rhythm.     Heart sounds: Normal heart sounds. No murmur.  Pulmonary:     Effort: Pulmonary effort is normal. No respiratory distress.     Breath sounds: Normal breath sounds. No wheezing.  Abdominal:     General: Bowel sounds are normal. There is no distension.     Palpations: Abdomen is soft.     Tenderness: There is no abdominal tenderness.  Musculoskeletal:        General:  No tenderness. Normal range of motion.     Cervical back: Normal range of motion and neck supple.  Skin:    General: Skin is warm and dry.     Findings: No erythema or rash.  Neurological:     Mental Status: He is alert and oriented to person, place, and time.     Cranial Nerves: No cranial nerve deficit.     Deep Tendon Reflexes: Reflexes are normal and symmetric.  Psychiatric:        Behavior:  Behavior normal.        Thought Content: Thought content normal.        Judgment: Judgment normal.     BP 138/73   Pulse 85   Temp 97.8 F (36.6 C) (Temporal)   Ht 5' 7" (1.702 m)   Wt 130 lb 12.8 oz (59.3 kg)   BMI 20.49 kg/m      Assessment & Plan:  SENON NIXON comes in today with chief complaint of Medical Management of Chronic Issues (Check on referral for sleep study, needs new referral for [facility], handicap form )   Diagnosis and orders addressed:  1. GAD (generalized anxiety disorder) Pt reviewed in [place] control database Contract up today - ALPRAZolam (XANAX) 1 MG tablet; Take 1 tablet (1 mg total) by mouth 2 (two) times daily as needed. Keep appt w/ Alyse Low for refills  Dispense: 60 tablet; Refill: 5 - ToxASSURE Select 13 (MW), Urine - CMP14+EGFR - CBC with Differential/Platelet  2. Benzodiazepine dependence (HCC) - ALPRAZolam (XANAX) 1 MG tablet; Take 1 tablet (1 mg total) by mouth 2 (two) times daily as needed. Keep appt w/ Alyse Low for refills  Dispense: 60 tablet; Refill: 5 - ToxASSURE Select 13 (MW), Urine - CMP14+EGFR - CBC with Differential/Platelet  3. Controlled substance agreement signed - ALPRAZolam (XANAX) 1 MG tablet; Take 1 tablet (1 mg total) by mouth 2 (two) times daily as needed. Keep appt w/ Alyse Low for refills  Dispense: 60 tablet; Refill: 5 - ToxASSURE Select 13 (MW), Urine - CMP14+EGFR - CBC with Differential/Platelet  4. Essential hypertension - amLODipine (NORVASC) 10 MG tablet; Take 1 tablet (10 mg total) by mouth daily.  Dispense: 90 tablet; Refill: 3 - lisinopril (ZESTRIL) 40 MG tablet; Take 1 tablet (40 mg total) by mouth daily. Needs to be seen for further refills.  Dispense: 30 tablet; Refill: 0 - metoprolol tartrate (LOPRESSOR) 100 MG tablet; Take 1 tablet (100 mg total) by mouth 2 (two) times daily.  Dispense: 180 tablet; Refill: 3 - CMP14+EGFR - CBC with Differential/Platelet  5. Hyperlipidemia, unspecified hyperlipidemia  type - pravastatin (PRAVACHOL) 40 MG tablet; Take 1 tablet (40 mg total) by mouth daily.  Dispense: 90 tablet; Refill: 3 - CMP14+EGFR - CBC with Differential/Platelet  6. Gastroesophageal reflux disease -Will change  - CMP14+EGFR - CBC with Differential/Platelet - pantoprazole (PROTONIX) 40 MG tablet; Take 1 tablet (40 mg total) by mouth daily.  Dispense: 90 tablet; Refill: 3  7. Atherosclerosis of native coronary artery with angina pectoris, unspecified whether native or transplanted heart (HCC) - CMP14+EGFR - CBC with Differential/Platelet  8. Gastroesophageal reflux disease, unspecified whether esophagitis present Will change Prilosec to Protonix 40 mg -Diet discussed- Avoid fried, spicy, citrus foods, caffeine and alcohol -Do not eat 2-3 hours before bedtime -Encouraged small frequent meals -Avoid NSAID's -RTO prn   - CMP14+EGFR - CBC with Differential/Platelet  9. Alcohol abuse - CMP14+EGFR - CBC with Differential/Platelet  10. Insomnia, unspecified type - CMP14+EGFR - CBC with  Differential/Platelet  11. TOBACCO ABUSE - CMP14+EGFR - CBC with Differential/Platelet  12. Thoracic aorta atherosclerosis (HCC) - CMP14+EGFR - CBC with Differential/Platelet  13. Chronic systolic heart failure (HCC) - CMP14+EGFR - CBC with Differential/Platelet  14. Psoriasis - Ambulatory referral to Dermatology - CMP14+EGFR - CBC with Differential/Platelet  15. Apnea - Ambulatory referral to Neurology - CMP14+EGFR - CBC with Differential/Platelet  16. Alcoholic peripheral neuropathy (HCC) Will gabapentin 100 mg TID - gabapentin (NEURONTIN) 100 MG capsule; Take 1 capsule (100 mg total) by mouth 3 (three) times daily.  Dispense: 90 capsule; Refill: 3   Labs pending Health Maintenance reviewed Diet and exercise encouraged  Follow up plan: 3 months    Evelina Dun, FNP

## 2019-10-17 ENCOUNTER — Other Ambulatory Visit: Payer: Self-pay | Admitting: Family

## 2019-10-18 ENCOUNTER — Other Ambulatory Visit: Payer: Self-pay | Admitting: Family

## 2019-10-18 DIAGNOSIS — R0681 Apnea, not elsewhere classified: Secondary | ICD-10-CM

## 2019-10-18 DIAGNOSIS — E871 Hypo-osmolality and hyponatremia: Secondary | ICD-10-CM

## 2019-10-18 LAB — TOXASSURE SELECT 13 (MW), URINE

## 2019-10-28 ENCOUNTER — Ambulatory Visit (INDEPENDENT_AMBULATORY_CARE_PROVIDER_SITE_OTHER): Payer: Medicare Other | Admitting: *Deleted

## 2019-10-28 DIAGNOSIS — I4729 Other ventricular tachycardia: Secondary | ICD-10-CM

## 2019-10-28 DIAGNOSIS — I428 Other cardiomyopathies: Secondary | ICD-10-CM

## 2019-10-28 DIAGNOSIS — B353 Tinea pedis: Secondary | ICD-10-CM | POA: Diagnosis not present

## 2019-10-28 LAB — CUP PACEART REMOTE DEVICE CHECK
Battery Remaining Longevity: 54 mo
Battery Remaining Percentage: 81 %
Brady Statistic RA Percent Paced: 0 %
Brady Statistic RV Percent Paced: 100 %
Date Time Interrogation Session: 20210510003100
HighPow Impedance: 56 Ohm
Implantable Lead Implant Date: 20040312
Implantable Lead Implant Date: 20040312
Implantable Lead Implant Date: 20040312
Implantable Lead Location: 753858
Implantable Lead Location: 753859
Implantable Lead Location: 753860
Implantable Lead Model: 158
Implantable Lead Model: 4087
Implantable Lead Model: 4513
Implantable Lead Serial Number: 129158
Implantable Lead Serial Number: 209446
Implantable Lead Serial Number: 407264
Implantable Pulse Generator Implant Date: 20150917
Lead Channel Impedance Value: 545 Ohm
Lead Channel Impedance Value: 588 Ohm
Lead Channel Impedance Value: 874 Ohm
Lead Channel Pacing Threshold Amplitude: 0.6 V
Lead Channel Pacing Threshold Amplitude: 1 V
Lead Channel Pacing Threshold Amplitude: 1.9 V
Lead Channel Pacing Threshold Pulse Width: 0.4 ms
Lead Channel Pacing Threshold Pulse Width: 0.4 ms
Lead Channel Pacing Threshold Pulse Width: 0.8 ms
Lead Channel Setting Pacing Amplitude: 2 V
Lead Channel Setting Pacing Amplitude: 2.5 V
Lead Channel Setting Pacing Amplitude: 3 V
Lead Channel Setting Pacing Pulse Width: 0.4 ms
Lead Channel Setting Pacing Pulse Width: 0.8 ms
Lead Channel Setting Sensing Sensitivity: 0.6 mV
Lead Channel Setting Sensing Sensitivity: 1 mV
Pulse Gen Serial Number: 106298

## 2019-10-28 NOTE — Progress Notes (Signed)
Remote ICD transmission.   

## 2019-11-12 ENCOUNTER — Other Ambulatory Visit: Payer: Self-pay

## 2019-11-12 ENCOUNTER — Ambulatory Visit: Payer: Medicare Other | Admitting: Neurology

## 2019-11-12 ENCOUNTER — Encounter: Payer: Self-pay | Admitting: Neurology

## 2019-11-12 VITALS — BP 144/72 | HR 52 | Ht 68.0 in | Wt 129.0 lb

## 2019-11-12 DIAGNOSIS — I10 Essential (primary) hypertension: Secondary | ICD-10-CM | POA: Diagnosis not present

## 2019-11-12 DIAGNOSIS — R0683 Snoring: Secondary | ICD-10-CM | POA: Diagnosis not present

## 2019-11-12 DIAGNOSIS — F17210 Nicotine dependence, cigarettes, uncomplicated: Secondary | ICD-10-CM | POA: Diagnosis not present

## 2019-11-12 DIAGNOSIS — K219 Gastro-esophageal reflux disease without esophagitis: Secondary | ICD-10-CM | POA: Diagnosis not present

## 2019-11-12 DIAGNOSIS — R0681 Apnea, not elsewhere classified: Secondary | ICD-10-CM | POA: Diagnosis not present

## 2019-11-12 NOTE — Patient Instructions (Signed)

## 2019-11-12 NOTE — Progress Notes (Signed)
SLEEP MEDICINE CLINIC    Provider:  Larey Seat, MD  Primary Care Physician:  Sharion Balloon, [place] [address]     Referring Provider: Sharion Balloon, [address] [place],  [place] [address]          Chief Complaint according to patient   Patient presents with:    . New Patient (Initial Visit)     presents today because he has been told snores loudly and stops breathing in sleep. he states about 1/wk he will wake himself up having to catch his breath and have to use a inhaler. He smokes.       HISTORY OF PRESENT ILLNESS:  [name] is a 79 year -old  Caucasian male patient and seen here upon referral on 11/12/2019 Chief concern according to patient :  " sometimes I choke awake- can't breathe"    I have the pleasure of seeing [name] today, a right-handed White or Caucasian male with a possible sleep disorder.  He  has a past medical history of Anxiety, Arthritis, CAD (coronary artery disease), Carotid artery disease ([place]), Cataract, CHF (congestive heart failure) ([place]), COPD (chronic obstructive pulmonary disease) ([place]), Depression, Dyslipidemia, EtOH dependence ([place]), GERD (gastroesophageal reflux disease), Hyperlipidemia, Hypertension, Insomnia, Nonischemic cardiomyopathy ([place]), Pacemaker, Peptic ulcer disease, and Tobacco abuse. Psoriatic arthritis.  Sleep relevant medical history: Nocturia 2-3 times ,  No Tonsillectomy , no ENT procedures. chronic tobacco smoker.    Family medical /sleep history: brother on CPAP with OSA.    Social history:  did not finish HS, was one of 11 children . 9 boys and 2 girls.   Patient is retired from Lobbyist, Equities trader,  and lives in a household alone. Family status is divorced for 40 years , with 4 adult children, many grandchildren, 4 great- grandchildren .  The patient currently used to work in shifts( night/ rotating,) Pets are not present. Tobacco use yes.  ETOH use; 2  beers every evening - light , some nights more. Caffeine intake in form of Coffee( decaff ) Soda( none) Tea ( none ) and nor energy drinks. Regular exercise - none .   Hobbies : metal detectorist. Garden.     Sleep habits are as follows:  The patient's dinner time is between 6-7 PM. The patient goes to bed at 7.30  PM and continues to sleep for 3 hours, wakes for 2-3 bathroom breaks, the first time at 12 PM   The preferred sleep position is on either side, with the support of 1 pillow on a non- adjustable bed.  Dreams are reportedly frequent/vivid- some memorable .   5AM is the usual rise time.  The patient wakes up spontaneously. He reports feeling mostly  refreshed and restored in AM,  with symptoms such as dry mouth , rarely morning headaches ,  Sinus pressure , forehead pain, and some residual fatigue after he takes his medications. .  Naps are taken frequently,  30 Minutes on average before lunch time- feels this is refreshing .    Review of Systems: Out of a complete 14 system review, the patient complains of only the following symptoms, and all other reviewed systems are negative.:  Fatigue, sleepiness , snoring, fragmented sleep, Nocturia, dry mouth, sinuistis. Naps in a recliner, snoring is loud.    How likely are you to doze in the following situations: 0 = not likely, 1 = slight chance, 2 = moderate  chance, 3 = high chance   Sitting and Reading? Watching Television? Sitting inactive in a public place (theater or meeting)? As a passenger in a car for an hour without a break? Lying down in the afternoon when circumstances permit? Sitting and talking to someone? Sitting quietly after lunch without alcohol? In a car, while stopped for a few minutes in traffic?   Total = 1/ 24 points   FSS endorsed at 40/ 63 points.   Social History   Socioeconomic History  . Marital status: Divorced    Spouse name: Not on file  . Number of children: 4  . Years of education: Some  college  . Highest education level: Some college, no degree  Occupational History  . Occupation: Retired    Fish farm manager: [employer]  Tobacco Use  . Smoking status: Light Tobacco Smoker    Packs/day: 0.25    Years: 45.00    Pack years: 11.25    Types: Cigarettes  . Smokeless tobacco: Former Systems developer    Types: Chew    Quit date: 06/20/1989  . Tobacco comment: he is not interested in cessation  Substance and Sexual Activity  . Alcohol use: Yes    Alcohol/week: 6.0 standard drinks    Types: 6 Cans of beer per week    Comment: drings 4-6 beers per day  . Drug use: No    Types: Marijuana    Comment: last used marijuana yesterday  . Sexual activity: Not Currently    Birth control/protection: None  Other Topics Concern  . Not on file  Social History Narrative   Divorced 2 sons 2 daughters   Is retired from Armed forces logistics/support/administrative officer, he was a Building control surveyor   3 alcoholic beverages daily no caffeine no drugs he still smokes no other tobacco or drug use   Social Determinants of Radio broadcast assistant Strain:   . Difficulty of Paying Living Expenses:   Food Insecurity:   . Worried About Charity fundraiser in the Last Year:   . Arboriculturist in the Last Year:   Transportation Needs:   . Film/video editor (Medical):   Marland Kitchen Lack of Transportation (Non-Medical):   Physical Activity:   . Days of Exercise per Week:   . Minutes of Exercise per Session:   Stress:   . Feeling of Stress :   Social Connections:   . Frequency of Communication with Friends and Family:   . Frequency of Social Gatherings with Friends and Family:   . Attends Religious Services:   . Active Member of Clubs or Organizations:   . Attends Archivist Meetings:   Marland Kitchen Marital Status:     Family History  Problem Relation Age of Onset  . Heart failure Father   . Colon polyps Sister   . GI Bleed Sister   . GI Bleed Brother   . Heart attack Brother   . Psoriasis Daughter   . Arthritis/Rheumatoid  Daughter   . Cancer Brother   . Heart disease Brother   . Heart disease Brother   . Colon cancer Neg Hx   . Esophageal cancer Neg Hx   . Rectal cancer Neg Hx   . Stomach cancer Neg Hx     Past Medical History:  Diagnosis Date  . Anxiety   . Arthritis   . CAD (coronary artery disease)    Nonobstructive cath 2012.  PCI to RCA 2003  . Carotid artery disease ([place])   . Cataract   .  CHF (congestive heart failure) ([place])   . COPD (chronic obstructive pulmonary disease) ([place])   . Depression   . Dyslipidemia   . EtOH dependence ([place])   . GERD (gastroesophageal reflux disease)   . Hyperlipidemia   . Hypertension   . Insomnia   . Nonischemic cardiomyopathy ([facility])    EF was 20% (60% last echo 2015)  . Pacemaker    and defibrillator  . Peptic ulcer disease   . Tobacco abuse     Past Surgical History:  Procedure Laterality Date  . BIV ICD GENERTAOR CHANGE OUT N/A 03/06/2014   [employer] Gen change by Dr Rayann Heman Beckie Salts CRT-D with LV1 header)  . biventricular defibrillator implantation     [employer] and pacemaker  . CATARACT EXTRACTION W/PHACO Right 12/10/2015   Procedure: CATARACT EXTRACTION PHACO AND INTRAOCULAR LENS PLACEMENT RIGHT EYE; CDE: 19.30;  Surgeon: Tonny Branch, MD;  Location: [facility];  Service: Ophthalmology;  Laterality: Right;  . CATARACT EXTRACTION W/PHACO Left 01/14/2016   Procedure: CATARACT EXTRACTION PHACO AND INTRAOCULAR LENS PLACEMENT LEFT EYE; CDE:  8.03;  Surgeon: Tonny Branch, MD;  Location: [facility];  Service: Ophthalmology;  Laterality: Left;  . COLONOSCOPY    . EYE SURGERY    . Ulcer surgery     repair of stomach ulcer     Current Outpatient Medications on File Prior to Visit  Medication Sig Dispense Refill  . albuterol (VENTOLIN HFA) 108 (90 Base) MCG/ACT inhaler Inhale 1 puff into the lungs every 6 (six) hours as needed for shortness of breath. 8 g 2  . ALPRAZolam (XANAX) 1 MG tablet Take 1 tablet (1 mg total) by mouth 2 (two) times daily as  needed. Keep appt w/ Alyse Low for refills 60 tablet 5  . amLODipine (NORVASC) 10 MG tablet Take 1 tablet (10 mg total) by mouth daily. 90 tablet 3  . aspirin 81 MG tablet Take 81 mg by mouth daily.    . cetirizine (ZYRTEC) 10 MG tablet Take 10 mg by mouth daily as needed for allergies.    Marland Kitchen gabapentin (NEURONTIN) 100 MG capsule Take 1 capsule (100 mg total) by mouth 3 (three) times daily. 90 capsule 3  . lisinopril (ZESTRIL) 40 MG tablet Take 1 tablet (40 mg total) by mouth daily. Needs to be seen for further refills. 30 tablet 0  . metoprolol tartrate (LOPRESSOR) 100 MG tablet Take 1 tablet (100 mg total) by mouth 2 (two) times daily. 180 tablet 3  . pantoprazole (PROTONIX) 40 MG tablet Take 1 tablet (40 mg total) by mouth daily. 90 tablet 3  . pravastatin (PRAVACHOL) 40 MG tablet Take 1 tablet (40 mg total) by mouth daily. 90 tablet 3   No current facility-administered medications on file prior to visit.    Allergies  Allergen Reactions  . Sudafed [Pseudoephedrine Hcl] Other (See Comments)    Unknown  . Prednisone Other (See Comments)    Depression and "personality changes".  . Avelox [Moxifloxacin Hcl In Nacl] Other (See Comments)    Unknown     Physical exam:  Today's Vitals   11/12/19 1002  BP: (!) 144/72  Pulse: (!) 52  Weight: 129 lb (58.5 kg)  Height: 5\' 8"  (1.727 m)   Body mass index is 19.61 kg/m.   Wt Readings from Last 3 Encounters:  11/12/19 129 lb (58.5 kg)  10/15/19 130 lb 12.8 oz (59.3 kg)  08/23/19 130 lb (59 kg)     Ht Readings from Last 3 Encounters:  11/12/19  5\' 8"  (1.727 m)  10/15/19 5\' 7"  (1.702 m)  08/23/19 5\' 7"  (1.702 m)      General: The patient is awake, alert and appears not in acute distress. The patient is well groomed. Head: Normocephalic, atraumatic. Neck is supple. Mallampati  1,  neck circumference:14.25 inches . Nasal airflow patent.  Retrognathia is not seen.  Dental status: dentures. Cardiovascular:  Regular rate and cardiac  rhythm by pulse,  without distended neck veins. Respiratory: Lungs are clear to auscultation.  Skin:  psoriasis. rash. Trunk: The patient's posture is erect.   Neurologic exam : The patient is awake and alert, oriented to place and time.   Memory subjective described as intact.  Attention span & concentration ability appears normal.  Speech is fluent,  with dysphonia  Mood and affect are appropriate.   Cranial nerves: no loss of smell or taste reported  Pupils are equal and briskly reactive to light. Funduscopic exam deferred.   Extraocular movements in vertical and horizontal planes were intact and without nystagmus. No Diplopia. Visual fields by finger perimetry are intact. Hearing was intact to soft voice and finger rubbing. Facial sensation intact to fine touch.  Facial motor strength is symmetric and tongue and uvula move midline.  Neck ROM : rotation, tilt and flexion extension were normal for age and shoulder shrug was symmetrical.    Motor exam:  Symmetric bulk, tone and ROM.    The patient had rotator cuff injuries and repairs, he does have abnormal relaxed muscle tone without cogwheeling, muscle mass is symmetric, but there is not a lot of bulk.  He also suffers from Raynaud's and gets bluish fingers most notable was the arthritic deformation of the thumb joint. Normal tone without cog wheeling, symmetric grip strength . he has psoriatic arthritis.  Sensory:  Fine touch, pinprick and vibration were normal.  Proprioception tested in the upper extremities was normal. Coordination: Rapid alternating movements in the fingers/hands were of normal speed.  The Finger-to-nose maneuver was intact without evidence of ataxia, dysmetria or tremor. Gait and station: Patient could rise unassisted from a seated position, walked without assistive device.  Stance is of normal width/ base and the patient turned with 4 steps.  Toe and heel walk were deferred.  Deep tendon reflexes: in the  upper  and lower extremities are symmetric and intact.  Babinski response was deferred .       After spending a total time of  45 minutes face to face and additional time for physical and neurologic examination, review of laboratory studies,  personal review of imaging studies, reports and results of other testing and review of referral information / records as far as provided in visit, I have established the following assessments:  1) it is very likely that [name] has obstructive sleep apnea and there may be an overlap with GERD related apnea.  He also goes to the bathroom 2-3 times at night but that may be more related to fluid intake.  He is an active smoker and certainly there is a risk of overlap with a pulmonary condition he would be more at risk to develop hypoxemia with apnea.  He is not excessively sleepy and he related to me that he feels the need for the naps only after he takes all his medications in the morning and some of them but certainly need to be more tired.  Zyrtec even but it is not one of the major sleepy making antihistamines is still an  antihistamine and induces more sleepiness.  Metoprolol can cause drowsiness and bradycardia, lisinopril amlodipine a less likely to cause drowsiness.    My Plan is to proceed with:  1) HST ordered as a screening test. I recommend addressing psoriais and arthritis with PCP.  =  I would like to thank Sharion Balloon, FNP  for allowing me to meet with and to take care of this pleasant patient.    I plan to follow up either personally or through our NP within 2-3  month.   CC: I will share my notes with PCP. Marland Kitchen  Electronically signed by: Larey Seat, MD 11/12/2019 10:18 AM  [facility] Board certified by The [employer] of Sleep Medicine and Diplomate of the [employer] of Sleep Medicine. Board certified In Neurology through the [place], Fellow of the [employer] of  Neurology. Medical Director of [facility].

## 2019-12-30 ENCOUNTER — Ambulatory Visit: Payer: Medicare Other | Admitting: Neurology

## 2020-01-06 ENCOUNTER — Ambulatory Visit (INDEPENDENT_AMBULATORY_CARE_PROVIDER_SITE_OTHER): Payer: Medicare Other | Admitting: Neurology

## 2020-01-06 ENCOUNTER — Other Ambulatory Visit: Payer: Self-pay

## 2020-01-06 DIAGNOSIS — G4733 Obstructive sleep apnea (adult) (pediatric): Secondary | ICD-10-CM

## 2020-01-06 DIAGNOSIS — F17210 Nicotine dependence, cigarettes, uncomplicated: Secondary | ICD-10-CM

## 2020-01-06 DIAGNOSIS — R0683 Snoring: Secondary | ICD-10-CM

## 2020-01-06 DIAGNOSIS — R0681 Apnea, not elsewhere classified: Secondary | ICD-10-CM

## 2020-01-06 DIAGNOSIS — I10 Essential (primary) hypertension: Secondary | ICD-10-CM

## 2020-01-21 ENCOUNTER — Telehealth: Payer: Self-pay

## 2020-01-21 NOTE — Procedures (Signed)
  Patient Information     First Name: Jorge Last Name: Leondro Koch: [id number]  Birth Date: [birth year] Age: 79 Gender: Male  Referring Provider: Sharion Balloon, FNP BMI: 19.7 (W=130 lbs, H=5' 8'')  Neck Circ.:  14 '' Epworth:  1/24   Sleep Study Information    Study Date: 01/06/20 S/H/A Version: 001.001.001.001 / 4.1.1528 / 77  History:    [name] is a right-handed White or Caucasian male with a possible sleep disorder.  He  has a past medical history of Anxiety, Arthritis, CAD (coronary artery disease), Carotid artery disease ([place]), Cataract, CHF (congestive heart failure) ([place]), COPD (chronic obstructive pulmonary disease) ([place]), Depression, Dyslipidemia, EtOH dependence ([place]), GERD (gastroesophageal reflux disease), Hyperlipidemia, Hypertension, Nonischemic cardiomyopathy ([place]), Pacemaker, Peptic ulcer disease, and Tobacco abuse. Psoriatic arthritis. Sleep relevant medical history: Nocturia 2-3 times, No Tonsillectomy, no ENT procedures. Social history:  chronic tobacco smoker.       Summary & Diagnosis:    Severe Central Sleep Apnea- AHI of 51.7/h and Central Apnea comprise the major part at an AHI of 35.1/h.   Recommendations:     Attended sleep study titration to positive airway therapy. This constellation of central apnea dominant complex sleep apnea cannot be auto-titrated.    Interpreting Physician: Larey Seat, MD             Sleep Summary    Oxygen Saturation Statistics     Start Study Time: End Study Time: Total Recording Time:  7:38:48 PM 5:42:49 AM 10 h, 4 min  Total Sleep Time % REM of Sleep Time:  9 h, 0 min  25.3    Mean: 92 Minimum: 86 Maximum: 98  Mean of Desaturations Nadirs (%):   91  Oxygen Desaturation. %: 4-9 10-20 >20 Total  Events Number Total  366 100.0  0 0.0  0 0.0  366 100.0  Oxygen Saturation: <90 <=88 <85 <80 <70  Duration (minutes): Sleep % 29.1 2.7 5.4 0.5 0.0 0.0 0.0 0.0 0.0 0.0     Respiratory Indices       Total Events REM NREM All Night  pRDI:  452  pAHI:  452 ODI:  366  pAHIc: 307 % CSR: 46.3 54.6 54.6 43.2 32.3 50.7 50.7 41.4 36.1 51.7 51.7 41.9 35.1       Pulse Rate Statistics during Sleep (BPM)      Mean:  67 Minimum: 42 Maximum: 132    Indices are calculated using technically valid sleep time of 8 h, 44 min. pRDI/pAHI are calculated using 02 desaturations ? 3%  Body Position Statistics  Position Supine Prone Right Left Non-Supine  Sleep (min) 294.0 1.5 244.0 0.0 245.5  Sleep % 54.4 0.3 45.1 0.0 45.4  pRDI 82.3 N/A 14.7 N/A 14.9  pAHI 82.3 N/A 14.7 N/A 14.9  ODI 71.6 N/A 5.8 N/A 6.1     Snoring Statistics Snoring Level (dB) >40 >50 >60 >70 >80 >Threshold (45)  Sleep (min) 158.3 29.8 7.5 0.0 0.0 57.4  Sleep % 29.3 5.5 1.4 0.0 0.0 10.6    Mean: 42 dB

## 2020-01-21 NOTE — Telephone Encounter (Signed)
I called pt. No answer, left a message asking pt to call me back.   Summary & Diagnosis:   Severe Central Sleep Apnea- AHI of 51.7/h and Central Apnea  comprise the major part at an AHI of 35.1/h.  Recommendations:    Attended sleep study titration to positive airway therapy. This  constellation of central apnea dominant complex sleep apnea  cannot be auto-titrated

## 2020-01-22 NOTE — Telephone Encounter (Signed)
I attempted to reach the pt. I was connected with a male briefly and then the phone line went dead.

## 2020-01-23 ENCOUNTER — Telehealth: Payer: Self-pay

## 2020-01-23 ENCOUNTER — Ambulatory Visit: Payer: Medicare Other | Admitting: Dermatology

## 2020-01-23 NOTE — Telephone Encounter (Signed)
LVM for pt to call me back to schedule sleep study  

## 2020-01-23 NOTE — Telephone Encounter (Signed)
I spoke with pt's daughter Vaughan Basta) she was present during the may visit and I relayed results.   She verbalized understanding and will advise pt of results.   She will wait to hear from our sleep lab in regards to scheduling.

## 2020-01-27 ENCOUNTER — Ambulatory Visit (INDEPENDENT_AMBULATORY_CARE_PROVIDER_SITE_OTHER): Payer: Medicare Other | Admitting: *Deleted

## 2020-01-27 DIAGNOSIS — I428 Other cardiomyopathies: Secondary | ICD-10-CM | POA: Diagnosis not present

## 2020-01-29 LAB — CUP PACEART REMOTE DEVICE CHECK
Battery Remaining Longevity: 48 mo
Battery Remaining Percentage: 75 %
Brady Statistic RA Percent Paced: 0 %
Brady Statistic RV Percent Paced: 100 %
Date Time Interrogation Session: 20210809003200
HighPow Impedance: 59 Ohm
Implantable Lead Implant Date: 20040312
Implantable Lead Implant Date: 20040312
Implantable Lead Implant Date: 20040312
Implantable Lead Location: 753858
Implantable Lead Location: 753859
Implantable Lead Location: 753860
Implantable Lead Model: 158
Implantable Lead Model: 4087
Implantable Lead Model: 4513
Implantable Lead Serial Number: 129158
Implantable Lead Serial Number: 209446
Implantable Lead Serial Number: 407264
Implantable Pulse Generator Implant Date: 20150917
Lead Channel Impedance Value: 595 Ohm
Lead Channel Impedance Value: 612 Ohm
Lead Channel Impedance Value: 937 Ohm
Lead Channel Pacing Threshold Amplitude: 0.6 V
Lead Channel Pacing Threshold Amplitude: 1 V
Lead Channel Pacing Threshold Amplitude: 1.9 V
Lead Channel Pacing Threshold Pulse Width: 0.4 ms
Lead Channel Pacing Threshold Pulse Width: 0.4 ms
Lead Channel Pacing Threshold Pulse Width: 0.8 ms
Lead Channel Setting Pacing Amplitude: 2 V
Lead Channel Setting Pacing Amplitude: 2.5 V
Lead Channel Setting Pacing Amplitude: 3 V
Lead Channel Setting Pacing Pulse Width: 0.4 ms
Lead Channel Setting Pacing Pulse Width: 0.8 ms
Lead Channel Setting Sensing Sensitivity: 0.6 mV
Lead Channel Setting Sensing Sensitivity: 1 mV
Pulse Gen Serial Number: 106298

## 2020-01-30 NOTE — Progress Notes (Signed)
Remote ICD transmission.   

## 2020-02-05 ENCOUNTER — Telehealth: Payer: Self-pay

## 2020-02-05 NOTE — Telephone Encounter (Signed)
LVM for pt to call me back to schedule sleep study  

## 2020-02-13 ENCOUNTER — Ambulatory Visit (INDEPENDENT_AMBULATORY_CARE_PROVIDER_SITE_OTHER): Payer: Medicare Other | Admitting: Neurology

## 2020-02-13 ENCOUNTER — Other Ambulatory Visit: Payer: Self-pay

## 2020-02-13 DIAGNOSIS — F17210 Nicotine dependence, cigarettes, uncomplicated: Secondary | ICD-10-CM

## 2020-02-13 DIAGNOSIS — G4731 Primary central sleep apnea: Secondary | ICD-10-CM | POA: Diagnosis not present

## 2020-02-13 DIAGNOSIS — I10 Essential (primary) hypertension: Secondary | ICD-10-CM

## 2020-02-13 DIAGNOSIS — R0683 Snoring: Secondary | ICD-10-CM

## 2020-02-13 DIAGNOSIS — R063 Periodic breathing: Secondary | ICD-10-CM

## 2020-02-13 DIAGNOSIS — K219 Gastro-esophageal reflux disease without esophagitis: Secondary | ICD-10-CM

## 2020-02-13 DIAGNOSIS — Z950 Presence of cardiac pacemaker: Secondary | ICD-10-CM

## 2020-02-27 DIAGNOSIS — R063 Periodic breathing: Secondary | ICD-10-CM | POA: Insufficient documentation

## 2020-02-27 NOTE — Procedures (Signed)
PATIENT'S NAME:  [name], [name] DOB:      18-Jul-1940      MR#:    [id number]     DATE OF RECORDING: 02/13/2020  [name]  REFERRING M.D.:  Odella Aquas, FNP Study Performed:   Titration to positive airway pressure therapy, CPAP and BiPAP. HISTORY:  KHALEED Koch is a right-handed White or Caucasian male with a past medical history of Anxiety, Arthritis, CAD (coronary artery disease), Carotid artery disease ([place]), Cataract, CHF (congestive heart failure) ([place]), COPD (chronic obstructive pulmonary disease) ([place]), Depression, Dyslipidemia, EtOH dependence ([place]), GERD (gastroesophageal reflux disease), Hyperlipidemia, Hypertension, Nonischemic cardiomyopathy ([place]), Pacemaker, Peptic ulcer disease, and Tobacco abuse. Psoriatic arthritis. Sleep relevant medical history: Nocturia 2-3 times, No Tonsillectomy, no ENT procedures. Social history:  chronic tobacco smoker. He returns following a HST study from 01-06-2020, which diagnosed severe central apnea at AHI of 51.7,    The patient endorsed the Epworth Sleepiness Scale at 1 point.    The patient's weight 130 pounds with a height of 68 (inches), resulting in a BMI of 19.7 kg/m2.  The patient's neck circumference measured 14 inches.  CURRENT MEDICATIONS: Xanax, Neurontin, Zestril, Lopressor, Protonix, Pravachol    PROCEDURE:  This is a multichannel digital polysomnogram utilizing the SomnoStar 11.2 system.  Electrodes and sensors were applied and monitored per AASM Specifications.   EEG, EOG, Chin and Limb EMG, were sampled at 200 Hz.  ECG, Snore and Nasal Pressure, Thermal Airflow, Respiratory Effort, CPAP Flow and Pressure, Oximetry was sampled at 50 Hz. Digital video and audio were recorded.      CPAP was initiated at 5 cmH20 with heated humidity per AASM split night standards and pressure was advanced to 13 cmH20 because of hypopneas, apneas and desaturations. None of the CPAP settings resolved sleep apnea and the patient was changed to BiPAP settings,  beginning at 14/10 cm pressure At a BiPAP pressure of 13/9 cmH20, there was a reduction of the AHI to 0 with improvement of sleep apnea and a total sleep time of 128 minutes. A ResMed FFm in medium -F30 model was used.   Lights Out was at 20:23 and Lights On at 04:31. Total recording time (TRT) was 488.5 minutes, with a total sleep time (TST) of 275 minutes. The patient's sleep latency was 65 minutes. REM latency was 290.5 minutes.  The sleep efficiency was 56.3 %.    SLEEP ARCHITECTURE: WASO (Wake after sleep onset) was 171.5 minutes.  There were 57 minutes in Stage N1, 197 minutes Stage N2, 1.5 minutes Stage N3 and 19.5 minutes in Stage REM.  The percentage of Stage N1 was 20.7%, Stage N2 was 71.6%, Stage N3 was .5% and Stage R (REM sleep) was 7.1%.   RESPIRATORY ANALYSIS:  There was a total of 27 respiratory events: 0 obstructive apneas, 0 central apneas and 0 mixed apneas with a total of 0 apneas and an apnea index (AI) of 0 /hour. There were 27 hypopneas with a hypopnea index of 5.9/hour.  The total APNEA/HYPOPNEA INDEX  (AHI) was 5.9 /hour and the total RESPIRATORY DISTURBANCE INDEX was 5.9 /hour  0 events occurred in REM sleep and 27 events in NREM. The REM AHI was 0 /hour versus a non-REM AHI of 6.3 /hour.  The patient spent 0 minutes of total sleep time in the supine position and 275 minutes in non-supine. The supine AHI was 0.0, versus a non-supine AHI of 5.9.  OXYGEN SATURATION & C02:  The baseline 02 saturation was 96%, with the lowest  being 90%. Time spent below 89% saturation equaled 0 minutes. The arousals were noted as: 16 were spontaneous, 0 were associated with PLMs, 18 were associated with respiratory events. The patient had a total of 0 Periodic Limb Movements.  Audio and video analysis did not show any abnormal or unusual movements, behaviors, phonations or vocalizations.    The patient had frequent sleep interruptions and prolonged periods of wakefulness.   EKG was in paced  rhythm.  DIAGNOSIS 1. Central Sleep Apnea did not respond to CPAP but was surprisingly well tolerated- change to BiPAP allowed complete resolution of apnea but was not well tolerated. The final pressure recommendation is BiPAP at 13/9 cm water. 2. No Sleep Related Hypoxemia was present.   PLANS/RECOMMENDATIONS: A ResMed FFm in medium -F30 model was used. BiPAP will be set to 13/9 cm water pressure with flex setting.    DISCUSSION: A follow up appointment will be scheduled in the Sleep Clinic at [facility].   Please call [phone] with any questions.      I certify that I have reviewed the entire raw data recording prior to the issuance of this report in accordance with the Standards of Accreditation of the American Academy of Sleep Medicine (AASM)    Larey Seat, M.D. Diplomat, Tax adviser of Psychiatry and Neurology  Diplomat, Tax adviser of Sleep Medicine Market researcher, [employer] [facility] at [employer]

## 2020-02-27 NOTE — Progress Notes (Signed)
EKG was in paced rhythm.   DIAGNOSIS  1. Central Sleep Apnea did not respond to CPAP but was  surprisingly well tolerated- change to BiPAP allowed complete  resolution of apnea but was not well tolerated. The final  pressure recommendation is BiPAP at 13/9 cm water.  2. No Sleep Related Hypoxemia was present.   PLANS/RECOMMENDATIONS: A ResMed FFm in medium -F30 model was  used. BiPAP will be set to 13/9 cm water pressure with flex  setting.

## 2020-02-27 NOTE — Addendum Note (Signed)
Addended by: Larey Seat on: 02/27/2020 05:04 PM   Modules accepted: Orders

## 2020-03-02 ENCOUNTER — Telehealth: Payer: Self-pay

## 2020-03-02 NOTE — Telephone Encounter (Signed)
-----   Message from Larey Seat, MD sent at 02/27/2020  5:03 PM EDT ----- EKG was in paced rhythm.   DIAGNOSIS  1. Central Sleep Apnea did not respond to CPAP but was  surprisingly well tolerated- change to BiPAP allowed complete  resolution of apnea but was not well tolerated. The final  pressure recommendation is BiPAP at 13/9 cm water.  2. No Sleep Related Hypoxemia was present.   PLANS/RECOMMENDATIONS: A ResMed FFm in medium -F30 model was  used. BiPAP will be set to 13/9 cm water pressure with flex  setting.

## 2020-03-02 NOTE — Telephone Encounter (Signed)
I called pt to discuss. No answer, left a message asking him to call me back. 

## 2020-03-05 NOTE — Telephone Encounter (Signed)
I called pt. I advised pt that Dr. Brett Fairy reviewed their sleep study results and found that pt was best treated with BiPAP. Dr. Brett Fairy recommends that pt starts BiPAP. I reviewed PAP compliance expectations with the pt. Pt is agreeable to starting a CPAP. I advised pt that an order will be sent to a DME, [facility] and [facility] will call the pt within about one week after they file with the pt's insurance. [facility]) will show the pt how to use the machine, fit for masks, and troubleshoot the CPAP if needed. A follow up appt was made for insurance purposes with Ward Givens, NP on Dec 16,2021 at 10 am. Pt verbalized understanding to arrive 15 minutes early and bring their CPAP. A letter with all of this information in it will be mailed to the pt as a reminder. I verified with the pt that the address we have on file is correct. Pt verbalized understanding of results. Pt had no questions at this time but was encouraged to call back if questions arise. I have sent the order to [facility]) and have received confirmation that they have received the order.

## 2020-03-18 DIAGNOSIS — G4737 Central sleep apnea in conditions classified elsewhere: Secondary | ICD-10-CM | POA: Diagnosis not present

## 2020-04-17 DIAGNOSIS — G4737 Central sleep apnea in conditions classified elsewhere: Secondary | ICD-10-CM | POA: Diagnosis not present

## 2020-04-27 ENCOUNTER — Ambulatory Visit (INDEPENDENT_AMBULATORY_CARE_PROVIDER_SITE_OTHER): Payer: Medicare Other

## 2020-04-27 DIAGNOSIS — I428 Other cardiomyopathies: Secondary | ICD-10-CM

## 2020-04-27 LAB — CUP PACEART REMOTE DEVICE CHECK
Battery Remaining Longevity: 48 mo
Battery Remaining Percentage: 73 %
Brady Statistic RA Percent Paced: 0 %
Brady Statistic RV Percent Paced: 100 %
Date Time Interrogation Session: 20211108012200
HighPow Impedance: 60 Ohm
Implantable Lead Implant Date: 20040312
Implantable Lead Implant Date: 20040312
Implantable Lead Implant Date: 20040312
Implantable Lead Location: 753858
Implantable Lead Location: 753859
Implantable Lead Location: 753860
Implantable Lead Model: 158
Implantable Lead Model: 4087
Implantable Lead Model: 4513
Implantable Lead Serial Number: 129158
Implantable Lead Serial Number: 209446
Implantable Lead Serial Number: 407264
Implantable Pulse Generator Implant Date: 20150917
Lead Channel Impedance Value: 582 Ohm
Lead Channel Impedance Value: 602 Ohm
Lead Channel Impedance Value: 956 Ohm
Lead Channel Pacing Threshold Amplitude: 0.6 V
Lead Channel Pacing Threshold Amplitude: 1 V
Lead Channel Pacing Threshold Amplitude: 1.9 V
Lead Channel Pacing Threshold Pulse Width: 0.4 ms
Lead Channel Pacing Threshold Pulse Width: 0.4 ms
Lead Channel Pacing Threshold Pulse Width: 0.8 ms
Lead Channel Setting Pacing Amplitude: 2 V
Lead Channel Setting Pacing Amplitude: 2.5 V
Lead Channel Setting Pacing Amplitude: 3 V
Lead Channel Setting Pacing Pulse Width: 0.4 ms
Lead Channel Setting Pacing Pulse Width: 0.8 ms
Lead Channel Setting Sensing Sensitivity: 0.6 mV
Lead Channel Setting Sensing Sensitivity: 1 mV
Pulse Gen Serial Number: 106298

## 2020-04-28 NOTE — Progress Notes (Signed)
Remote ICD transmission.   

## 2020-04-30 ENCOUNTER — Other Ambulatory Visit: Payer: Self-pay | Admitting: Family

## 2020-04-30 DIAGNOSIS — Z79899 Other long term (current) drug therapy: Secondary | ICD-10-CM

## 2020-04-30 DIAGNOSIS — F411 Generalized anxiety disorder: Secondary | ICD-10-CM

## 2020-04-30 DIAGNOSIS — F1320 Sedative, hypnotic or anxiolytic dependence, uncomplicated: Secondary | ICD-10-CM

## 2020-05-01 ENCOUNTER — Other Ambulatory Visit: Payer: Self-pay | Admitting: Family

## 2020-05-01 DIAGNOSIS — Z79899 Other long term (current) drug therapy: Secondary | ICD-10-CM

## 2020-05-01 DIAGNOSIS — F411 Generalized anxiety disorder: Secondary | ICD-10-CM

## 2020-05-01 DIAGNOSIS — F1320 Sedative, hypnotic or anxiolytic dependence, uncomplicated: Secondary | ICD-10-CM

## 2020-05-04 ENCOUNTER — Other Ambulatory Visit: Payer: Self-pay | Admitting: Family

## 2020-05-04 DIAGNOSIS — F411 Generalized anxiety disorder: Secondary | ICD-10-CM

## 2020-05-04 DIAGNOSIS — F1320 Sedative, hypnotic or anxiolytic dependence, uncomplicated: Secondary | ICD-10-CM

## 2020-05-04 DIAGNOSIS — Z79899 Other long term (current) drug therapy: Secondary | ICD-10-CM

## 2020-05-12 ENCOUNTER — Other Ambulatory Visit: Payer: Self-pay

## 2020-05-12 ENCOUNTER — Encounter: Payer: Self-pay | Admitting: Family

## 2020-05-12 ENCOUNTER — Ambulatory Visit (INDEPENDENT_AMBULATORY_CARE_PROVIDER_SITE_OTHER): Payer: Medicare Other | Admitting: Family

## 2020-05-12 VITALS — BP 150/76 | HR 58 | Temp 96.2°F | Ht 68.0 in | Wt 131.0 lb

## 2020-05-12 DIAGNOSIS — R350 Frequency of micturition: Secondary | ICD-10-CM | POA: Diagnosis not present

## 2020-05-12 DIAGNOSIS — I25119 Atherosclerotic heart disease of native coronary artery with unspecified angina pectoris: Secondary | ICD-10-CM

## 2020-05-12 DIAGNOSIS — Z79899 Other long term (current) drug therapy: Secondary | ICD-10-CM

## 2020-05-12 DIAGNOSIS — F411 Generalized anxiety disorder: Secondary | ICD-10-CM

## 2020-05-12 DIAGNOSIS — G621 Alcoholic polyneuropathy: Secondary | ICD-10-CM

## 2020-05-12 DIAGNOSIS — I10 Essential (primary) hypertension: Secondary | ICD-10-CM

## 2020-05-12 DIAGNOSIS — F1320 Sedative, hypnotic or anxiolytic dependence, uncomplicated: Secondary | ICD-10-CM

## 2020-05-12 DIAGNOSIS — F1010 Alcohol abuse, uncomplicated: Secondary | ICD-10-CM

## 2020-05-12 DIAGNOSIS — I5022 Chronic systolic (congestive) heart failure: Secondary | ICD-10-CM | POA: Diagnosis not present

## 2020-05-12 DIAGNOSIS — L409 Psoriasis, unspecified: Secondary | ICD-10-CM

## 2020-05-12 DIAGNOSIS — E785 Hyperlipidemia, unspecified: Secondary | ICD-10-CM

## 2020-05-12 DIAGNOSIS — F17200 Nicotine dependence, unspecified, uncomplicated: Secondary | ICD-10-CM

## 2020-05-12 DIAGNOSIS — Z1159 Encounter for screening for other viral diseases: Secondary | ICD-10-CM | POA: Diagnosis not present

## 2020-05-12 DIAGNOSIS — K219 Gastro-esophageal reflux disease without esophagitis: Secondary | ICD-10-CM | POA: Diagnosis not present

## 2020-05-12 DIAGNOSIS — I700 Atherosclerosis of aorta: Secondary | ICD-10-CM

## 2020-05-12 DIAGNOSIS — R0681 Apnea, not elsewhere classified: Secondary | ICD-10-CM

## 2020-05-12 LAB — URINALYSIS, COMPLETE
Bilirubin, UA: NEGATIVE
Glucose, UA: NEGATIVE
Ketones, UA: NEGATIVE
Leukocytes,UA: NEGATIVE
Nitrite, UA: NEGATIVE
Protein,UA: NEGATIVE
RBC, UA: NEGATIVE
Specific Gravity, UA: 1.015 (ref 1.005–1.030)
Urobilinogen, Ur: 0.2 mg/dL (ref 0.2–1.0)
pH, UA: 7 (ref 5.0–7.5)

## 2020-05-12 LAB — MICROSCOPIC EXAMINATION

## 2020-05-12 MED ORDER — ALPRAZOLAM 1 MG PO TABS: 1.0000 mg | ORAL_TABLET | Freq: Two times a day (BID) | ORAL | 2 refills | Status: DC | PRN

## 2020-05-12 NOTE — Patient Instructions (Signed)

## 2020-05-12 NOTE — Progress Notes (Signed)
Subjective:    Patient ID: [name], male    DOB: [birth year], 79 y.o.   MRN: [id number]  Chief Complaint  Patient presents with  . Hypertension    6 mth   . Flank Pain    left saide x week  . Urinary Frequency   Pt presents to the office today for chronic follow up. PT is followed by Cardiologists for CAD, CHF, Atherosclerosis, Cardiomyopathy, and Ventricular tachycardia every 2 years. PT has pacemaker and is followed annually for this.   Pt is followed by Dermatologists forpsoriasis.   He does report drinking 5-6 beers a day.  Hypertension This is a chronic problem. The current episode started more than 1 year ago. The problem has been waxing and waning since onset. The problem is uncontrolled. Associated symptoms include anxiety and malaise/fatigue. Pertinent negatives include no peripheral edema or shortness of breath. Risk factors for coronary artery disease include dyslipidemia and sedentary lifestyle. The current treatment provides moderate improvement. Hypertensive end-organ damage includes CAD/MI and heart failure.  Urinary Frequency  This is a new problem. The current episode started 1 to 4 weeks ago. The problem occurs every urination. The problem has been gradually worsening. The pain is at a severity of 0/10. Associated symptoms include flank pain, frequency and urgency. Pertinent negatives include no hematuria, nausea or vomiting. He has tried increased fluids for the symptoms. The treatment provided mild relief.  Gastroesophageal Reflux He complains of belching and heartburn. He reports no nausea. This is a chronic problem. The current episode started more than 1 year ago. The problem occurs occasionally. The problem has been waxing and waning. He has tried a PPI for the symptoms. The treatment provided moderate relief.  Nicotine Dependence Presents for follow-up visit. Symptoms include insomnia and irritability. His urge triggers include company of smokers. The  symptoms have been stable. He smokes < 1/2 a pack of cigarettes per day.  Insomnia Primary symptoms: difficulty falling asleep, frequent awakening, malaise/fatigue.  The current episode started more than one year. The onset quality is gradual. The problem occurs intermittently.  Hyperlipidemia This is a chronic problem. The current episode started more than 1 year ago. The problem is controlled. Recent lipid tests were reviewed and are normal. Pertinent negatives include no shortness of breath. Current antihyperlipidemic treatment includes diet change. The current treatment provides mild improvement of lipids. Risk factors for coronary artery disease include dyslipidemia, male sex, hypertension and a sedentary lifestyle.  Anxiety Presents for follow-up visit. Symptoms include excessive worry, insomnia, irritability, nervous/anxious behavior, panic and restlessness. Patient reports no nausea or shortness of breath. Symptoms occur most days. The severity of symptoms is moderate. The quality of sleep is good.    Peripheral Neuropathy Complaining of pain in bilateral feet of 7 out 10.     Review of Systems  Constitutional: Positive for irritability and malaise/fatigue.  Respiratory: Negative for shortness of breath.   Gastrointestinal: Positive for heartburn. Negative for nausea and vomiting.  Genitourinary: Positive for flank pain, frequency and urgency. Negative for hematuria.  Psychiatric/Behavioral: The patient is nervous/anxious and has insomnia.   All other systems reviewed and are negative.      Objective:   Physical Exam Vitals reviewed.  Constitutional:      General: He is not in acute distress.    Appearance: He is well-developed.  HENT:     Head: Normocephalic.     Right Ear: Tympanic membrane normal.     Left Ear: Tympanic membrane normal.  Eyes:     General:        Right eye: No discharge.        Left eye: No discharge.     Pupils: Pupils are equal, round, and reactive  to light.  Neck:     Thyroid: No thyromegaly.  Cardiovascular:     Rate and Rhythm: Regular rhythm. Bradycardia present.     Heart sounds: Normal heart sounds. No murmur heard.   Pulmonary:     Effort: Pulmonary effort is normal. No respiratory distress.     Breath sounds: Wheezing present.  Abdominal:     General: Bowel sounds are normal. There is no distension.     Palpations: Abdomen is soft.     Tenderness: There is no abdominal tenderness.  Musculoskeletal:        General: No tenderness. Normal range of motion.     Cervical back: Normal range of motion and neck supple.  Skin:    General: Skin is warm and dry.     Findings: No erythema or rash.  Neurological:     Mental Status: He is alert and oriented to person, place, and time.     Cranial Nerves: No cranial nerve deficit.     Deep Tendon Reflexes: Reflexes are normal and symmetric.  Psychiatric:        Behavior: Behavior normal.        Thought Content: Thought content normal.        Judgment: Judgment normal.       BP (!) 150/76   Pulse (!) 58   Temp (!) 96.2 F (35.7 C) (Temporal)   Ht _0  (1.727 m)   Wt 131 lb (59.4 kg)   SpO2 99%   BMI 19.92 kg/m      Assessment & Plan:  [name] comes in today with chief complaint of Hypertension (6 mth ), Flank Pain (left saide x week), and Urinary Frequency   Diagnosis and orders addressed:  1. Chronic systolic heart failure (HCC) - CMP14+EGFR - CBC with Differential/Platelet  2. Atherosclerosis of native coronary artery with angina pectoris, unspecified whether native or transplanted heart (HCC) - CMP14+EGFR - CBC with Differential/Platelet  3. Essential hypertension - CMP14+EGFR - CBC with Differential/Platelet  4. GERD with apnea - CMP14+EGFR - CBC with Differential/Platelet  5. Alcoholic peripheral neuropathy (HCC) - CMP14+EGFR - CBC with Differential/Platelet  6. Psoriasis - CMP14+EGFR - CBC with Differential/Platelet  7. Alcohol  abuse - CMP14+EGFR - CBC with Differential/Platelet  8. GAD (generalized anxiety disorder) - ToxASSURE Select 13 (MW), Urine - ALPRAZolam (XANAX) 1 MG tablet; Take 1 tablet (1 mg total) by mouth 2 (two) times daily as needed. Keep appt w/ Alyse Low for refills  Dispense: 60 tablet; Refill: 2 - CMP14+EGFR - CBC with Differential/Platelet  9. Controlled substance agreement signed - ToxASSURE Select 13 (MW), Urine - ALPRAZolam (XANAX) 1 MG tablet; Take 1 tablet (1 mg total) by mouth 2 (two) times daily as needed. Keep appt w/ Alyse Low for refills  Dispense: 60 tablet; Refill: 2 - CMP14+EGFR - CBC with Differential/Platelet  10. Benzodiazepine dependence (HCC) - ToxASSURE Select 13 (MW), Urine - ALPRAZolam (XANAX) 1 MG tablet; Take 1 tablet (1 mg total) by mouth 2 (two) times daily as needed. Keep appt w/ Alyse Low for refills  Dispense: 60 tablet; Refill: 2 - CMP14+EGFR - CBC with Differential/Platelet  11. Hyperlipidemia, unspecified hyperlipidemia type  - CMP14+EGFR - CBC with Differential/Platelet  12. TOBACCO ABUSE - CMP14+EGFR - CBC with Differential/Platelet  13. Urinary frequency - Urine Culture - Urinalysis, Complete - CMP14+EGFR - CBC with Differential/Platelet  14. Need for hepatitis C screening test - Hepatitis C antibody - CMP14+EGFR - CBC with Differential/Platelet  15. Thoracic aorta atherosclerosis ([place])   Labs pending Patient reviewed in [place] controlled database, no flags noted. Contract and drug screen are up dated today Health Maintenance reviewed Diet and exercise encouraged  Follow up plan: 3 months    Evelina Dun, FNP

## 2020-05-12 NOTE — Progress Notes (Signed)
ta

## 2020-05-13 LAB — CBC WITH DIFFERENTIAL/PLATELET
Basophils Absolute: 0.1 10*3/uL (ref 0.0–0.2)
Basos: 1 %
EOS (ABSOLUTE): 0.1 10*3/uL (ref 0.0–0.4)
Eos: 1 %
Hematocrit: 35.3 % — ABNORMAL LOW (ref 37.5–51.0)
Hemoglobin: 12 g/dL — ABNORMAL LOW (ref 13.0–17.7)
Immature Grans (Abs): 0 10*3/uL (ref 0.0–0.1)
Immature Granulocytes: 0 %
Lymphocytes Absolute: 2.1 10*3/uL (ref 0.7–3.1)
Lymphs: 19 %
MCH: 31.7 pg (ref 26.6–33.0)
MCHC: 34 g/dL (ref 31.5–35.7)
MCV: 93 fL (ref 79–97)
Monocytes Absolute: 1.4 10*3/uL — ABNORMAL HIGH (ref 0.1–0.9)
Monocytes: 13 %
Neutrophils Absolute: 7.3 10*3/uL — ABNORMAL HIGH (ref 1.4–7.0)
Neutrophils: 66 %
Platelets: 382 10*3/uL (ref 150–450)
RBC: 3.78 x10E6/uL — ABNORMAL LOW (ref 4.14–5.80)
RDW: 12.6 % (ref 11.6–15.4)
WBC: 11 10*3/uL — ABNORMAL HIGH (ref 3.4–10.8)

## 2020-05-13 LAB — CMP14+EGFR
ALT: 16 IU/L (ref 0–44)
AST: 25 IU/L (ref 0–40)
Albumin/Globulin Ratio: 1.6 (ref 1.2–2.2)
Albumin: 4.8 g/dL — ABNORMAL HIGH (ref 3.7–4.7)
Alkaline Phosphatase: 87 IU/L (ref 44–121)
BUN/Creatinine Ratio: 15 (ref 10–24)
BUN: 16 mg/dL (ref 8–27)
Bilirubin Total: 0.4 mg/dL (ref 0.0–1.2)
CO2: 23 mmol/L (ref 20–29)
Calcium: 10.1 mg/dL (ref 8.6–10.2)
Chloride: 92 mmol/L — ABNORMAL LOW (ref 96–106)
Creatinine, Ser: 1.04 mg/dL (ref 0.76–1.27)
GFR calc Af Amer: 80 mL/min/{1.73_m2} (ref >59)
GFR calc non Af Amer: 69 mL/min/{1.73_m2} (ref >59)
Globulin, Total: 3 g/dL (ref 1.5–4.5)
Glucose: 94 mg/dL (ref 65–99)
Potassium: 5.4 mmol/L — ABNORMAL HIGH (ref 3.5–5.2)
Sodium: 129 mmol/L — ABNORMAL LOW (ref 134–144)
Total Protein: 7.8 g/dL (ref 6.0–8.5)

## 2020-05-13 LAB — HEPATITIS C ANTIBODY: Hep C Virus Ab: <0.1 s/co ratio (ref 0.0–0.9)

## 2020-05-14 LAB — URINE CULTURE: Organism ID, Bacteria: NO GROWTH

## 2020-05-18 ENCOUNTER — Other Ambulatory Visit: Payer: Self-pay

## 2020-05-18 DIAGNOSIS — G4737 Central sleep apnea in conditions classified elsewhere: Secondary | ICD-10-CM | POA: Diagnosis not present

## 2020-05-18 DIAGNOSIS — E875 Hyperkalemia: Secondary | ICD-10-CM

## 2020-05-19 LAB — TOXASSURE SELECT 13 (MW), URINE

## 2020-05-21 ENCOUNTER — Other Ambulatory Visit: Payer: Medicare Other

## 2020-05-21 DIAGNOSIS — I10 Essential (primary) hypertension: Secondary | ICD-10-CM | POA: Diagnosis not present

## 2020-05-21 LAB — CMP14+EGFR
ALT: 15 IU/L (ref 0–44)
AST: 24 IU/L (ref 0–40)
Albumin/Globulin Ratio: 1.6 (ref 1.2–2.2)
Albumin: 4.7 g/dL (ref 3.7–4.7)
Alkaline Phosphatase: 85 IU/L (ref 44–121)
BUN/Creatinine Ratio: 14 (ref 10–24)
BUN: 15 mg/dL (ref 8–27)
Bilirubin Total: 0.4 mg/dL (ref 0.0–1.2)
CO2: 22 mmol/L (ref 20–29)
Calcium: 10 mg/dL (ref 8.6–10.2)
Chloride: 91 mmol/L — ABNORMAL LOW (ref 96–106)
Creatinine, Ser: 1.07 mg/dL (ref 0.76–1.27)
GFR calc Af Amer: 78 mL/min/{1.73_m2} (ref >59)
GFR calc non Af Amer: 67 mL/min/{1.73_m2} (ref >59)
Globulin, Total: 2.9 g/dL (ref 1.5–4.5)
Glucose: 98 mg/dL (ref 65–99)
Potassium: 4.3 mmol/L (ref 3.5–5.2)
Sodium: 130 mmol/L — ABNORMAL LOW (ref 134–144)
Total Protein: 7.6 g/dL (ref 6.0–8.5)

## 2020-05-22 ENCOUNTER — Ambulatory Visit: Payer: Medicare Other | Admitting: Family

## 2020-06-04 ENCOUNTER — Encounter: Payer: Self-pay | Admitting: Adult Health

## 2020-06-04 ENCOUNTER — Ambulatory Visit: Payer: Medicare Other | Admitting: Adult Health

## 2020-06-04 VITALS — BP 130/68 | Ht 68.0 in | Wt 132.2 lb

## 2020-06-04 DIAGNOSIS — G4733 Obstructive sleep apnea (adult) (pediatric): Secondary | ICD-10-CM | POA: Diagnosis not present

## 2020-06-04 NOTE — Progress Notes (Signed)
PATIENT: [name] DOB: Jun 24, 1940  REASON FOR VISIT: follow up HISTORY FROM: patient  HISTORY OF PRESENT ILLNESS: Today 06/04/20:  [name] is a 79 year old male with a history of obstructive sleep apnea on BiPAP.  His download indicates that he uses his machine 29 out of 30 days for compliance of 97%.  He uses machine greater than 4 hours 28 days for compliance of 93%.  On average he uses his machine 9 hours and 29 minutes.  His residual AHI is 7.1 on 13/9 centimeters of water with pressure support of 4.  His leak in the 95th percentile is 59.5 L/min.  He states that the mask does not fit appropriately.  It does hurt the bridge of his nose.  He states that he has noticed the benefit.  He returns today for an evaluation.   HISTORY [name] a 79 year -old  Caucasian male patientand seen here upon referralon 11/12/2019 Chiefconcernaccording to patient : " sometimes I choke awake- can't breathe"   I have the pleasure of seeing [name] today,a right-handed White or Caucasian male with a possible sleep disorder. He  has a past medical history of Anxiety, Arthritis, CAD (coronary artery disease), Carotid artery disease ([place]), Cataract, CHF (congestive heart failure) ([place]), COPD (chronic obstructive pulmonary disease) ([place]), Depression, Dyslipidemia, EtOH dependence ([place]), GERD (gastroesophageal reflux disease), Hyperlipidemia, Hypertension, Insomnia, Nonischemic cardiomyopathy ([place]), Pacemaker, Peptic ulcer disease, and Tobacco abuse. Psoriatic arthritis.  Sleeprelevant medical history: Nocturia 2-3 times ,  No Tonsillectomy , no ENT procedures. chronic tobacco smoker.   Familymedical /sleep history:brother on CPAP with OSA.  Social history: did not finish HS, was one of 11 children . 9 boys and 2 girls.  Patient is retired from Lobbyist, Equities trader,  and lives in a household alone. Family status is divorced for 40 years , with 4 adult  children, many grandchildren, 4 great- grandchildren .  The patient currently used to work in shifts( night/ rotating,) Pets are not present. Tobacco use yes.  ETOH use; 2 beers every evening - light , some nights more. Caffeine intake in form of Coffee( decaff ) Soda( none) Tea ( none ) and nor energy drinks. Regular exercise - none .   Hobbies : metal detectorist. Garden.    Sleep habits are as follows: The patient's dinner time is between 6-7 PM. The patient goes to bed at 7.30  PM and continues to sleep for 3 hours, wakes for 2-3 bathroom breaks, the first time at 12 PM   The preferred sleep position is on either side, with the support of 1 pillow on a non- adjustable bed.  Dreams are reportedly frequent/vivid- some memorable .   5AM is the usual rise time.  The patient wakes up spontaneously. He reports feeling mostly  refreshed and restored in AM,  with symptoms such as dry mouth , rarely morning headaches ,  Sinus pressure , forehead pain, and some residual fatigue after he takes his medications. .  Naps are taken frequently,  30 Minutes on average before lunch time- feels this is refreshing .    REVIEW OF SYSTEMS: Out of a complete 14 system review of symptoms, the patient complains only of the following symptoms, and all other reviewed systems are negative.  FSS 36 ESS1  ALLERGIES: Allergies  Allergen Reactions  . Gabapentin Anaphylaxis  . Sudafed [Pseudoephedrine Hcl] Other (See Comments)    Unknown  . Prednisone Other (See Comments)    Depression and "  personality changes".  . Avelox [Moxifloxacin Hcl In Nacl] Other (See Comments)    Unknown     HOME MEDICATIONS: Outpatient Medications Prior to Visit  Medication Sig Dispense Refill  . albuterol (VENTOLIN HFA) 108 (90 Base) MCG/ACT inhaler Inhale 1 puff into the lungs every 6 (six) hours as needed for shortness of breath. 8 g 2  . ALPRAZolam (XANAX) 1 MG tablet Take 1 tablet (1 mg total) by mouth 2 (two) times  daily as needed. Keep appt w/ Christy for refills 60 tablet 2  . amLODipine (NORVASC) 10 MG tablet Take 1 tablet (10 mg total) by mouth daily. 90 tablet 3  . aspirin 81 MG tablet Take 81 mg by mouth daily.    . cetirizine (ZYRTEC) 10 MG tablet Take 10 mg by mouth daily as needed for allergies.    Marland Kitchen lisinopril (ZESTRIL) 40 MG tablet Take 1 tablet (40 mg total) by mouth daily. Needs to be seen for further refills. 30 tablet 0  . metoprolol tartrate (LOPRESSOR) 100 MG tablet Take 1 tablet (100 mg total) by mouth 2 (two) times daily. 180 tablet 3  . pantoprazole (PROTONIX) 40 MG tablet Take 1 tablet (40 mg total) by mouth daily. 90 tablet 3  . pravastatin (PRAVACHOL) 40 MG tablet Take 1 tablet (40 mg total) by mouth daily. 90 tablet 3   No facility-administered medications prior to visit.    PAST MEDICAL HISTORY: Past Medical History:  Diagnosis Date  . Anxiety   . Arthritis   . CAD (coronary artery disease)    Nonobstructive cath 2012.  PCI to RCA 2003  . Carotid artery disease ([place])   . Cataract   . CHF (congestive heart failure) ([place])   . COPD (chronic obstructive pulmonary disease) ([place])   . Depression   . Dyslipidemia   . EtOH dependence ([place])   . GERD (gastroesophageal reflux disease)   . Hyperlipidemia   . Hypertension   . Insomnia   . Nonischemic cardiomyopathy (HCC)    EF was 20% (60% last echo 2015)  . Pacemaker    and defibrillator  . Peptic ulcer disease   . Tobacco abuse     PAST SURGICAL HISTORY: Past Surgical History:  Procedure Laterality Date  . BIV ICD GENERTAOR CHANGE OUT N/A 03/06/2014   [employer] Gen change by Dr Rayann Heman Beckie Salts CRT-D with LV1 header)  . biventricular defibrillator implantation     [employer] and pacemaker  . CATARACT EXTRACTION W/PHACO Right 12/10/2015   Procedure: CATARACT EXTRACTION PHACO AND INTRAOCULAR LENS PLACEMENT RIGHT EYE; CDE: 19.30;  Surgeon: Tonny Branch, MD;  Location: [facility];  Service: Ophthalmology;   Laterality: Right;  . CATARACT EXTRACTION W/PHACO Left 01/14/2016   Procedure: CATARACT EXTRACTION PHACO AND INTRAOCULAR LENS PLACEMENT LEFT EYE; CDE:  8.03;  Surgeon: Tonny Branch, MD;  Location: [facility];  Service: Ophthalmology;  Laterality: Left;  . COLONOSCOPY    . EYE SURGERY    . Ulcer surgery     repair of stomach ulcer    FAMILY HISTORY: Family History  Problem Relation Age of Onset  . Heart failure Father   . Colon polyps Sister   . GI Bleed Sister   . GI Bleed Brother   . Heart attack Brother   . Psoriasis Daughter   . Arthritis/Rheumatoid Daughter   . Cancer Brother   . Heart disease Brother   . Heart disease Brother   . Colon cancer Neg Hx   . Esophageal cancer Neg Hx   .  Rectal cancer Neg Hx   . Stomach cancer Neg Hx     SOCIAL HISTORY: Social History   Socioeconomic History  . Marital status: Divorced    Spouse name: Not on file  . Number of children: 4  . Years of education: Some college  . Highest education level: Some college, no degree  Occupational History  . Occupation: Retired    Fish farm manager: [employer]  Tobacco Use  . Smoking status: Light Tobacco Smoker    Packs/day: 0.25    Years: 45.00    Pack years: 11.25    Types: Cigarettes  . Smokeless tobacco: Former Systems developer    Types: Chew    Quit date: 06/20/1989  . Tobacco comment: he is not interested in cessation  Vaping Use  . Vaping Use: Never used  Substance and Sexual Activity  . Alcohol use: Yes    Alcohol/week: 6.0 standard drinks    Types: 6 Cans of beer per week    Comment: drings 4-6 beers per day  . Drug use: No    Types: Marijuana    Comment: last used marijuana yesterday  . Sexual activity: Not Currently    Birth control/protection: None  Other Topics Concern  . Not on file  Social History Narrative   Divorced 2 sons 2 daughters   Is retired from Armed forces logistics/support/administrative officer, he was a Building control surveyor   3 alcoholic beverages daily no caffeine no drugs he still smokes no other tobacco or  drug use   Social Determinants of Radio broadcast assistant Strain: Not on file  Food Insecurity: Not on file  Transportation Needs: Not on file  Physical Activity: Not on file  Stress: Not on file  Social Connections: Not on file  Intimate Partner Violence: Not on file      PHYSICAL EXAM  Vitals:   06/04/20 0955  BP: 130/68  Weight: 132 lb 3.2 oz (60 kg)  Height: 5\' 8"  (1.727 m)   Body mass index is 20.1 kg/m.  Generalized: Well developed, in no acute distress  Chest: Lungs clear to auscultation bilaterally  Neurological examination  Mentation: Alert oriented to time, place, history taking. Follows all commands speech and language fluent Cranial nerve II-XII: Extraocular movements were full, visual field were full on confrontational test Head turning and shoulder shrug  were normal and symmetric. Motor: The motor testing reveals 5 over 5 strength of all 4 extremities. Good symmetric motor tone is noted throughout.  Sensory: Sensory testing is intact to soft touch on all 4 extremities. No evidence of extinction is noted.  Gait and station: Gait is normal.    DIAGNOSTIC DATA (LABS, IMAGING, TESTING) - I reviewed patient records, labs, notes, testing and imaging myself where available.  Lab Results  Component Value Date   WBC 11.0 (H) 05/12/2020   HGB 12.0 (L) 05/12/2020   HCT 35.3 (L) 05/12/2020   MCV 93 05/12/2020   PLT 382 05/12/2020      Component Value Date/Time   NA 130 (L) 05/21/2020 1058   NA 123 (L) 07/03/2014 1042   K 4.3 05/21/2020 1058   K 5.0 07/03/2014 1042   CL 91 (L) 05/21/2020 1058   CL 89 (L) 07/03/2014 1042   CO2 22 05/21/2020 1058   CO2 27 07/03/2014 1042   GLUCOSE 98 05/21/2020 1058   GLUCOSE 96 12/08/2015 1422   GLUCOSE 134 (H) 07/03/2014 1042   BUN 15 05/21/2020 1058   BUN 14 07/03/2014 1042   CREATININE 1.07 05/21/2020  1058   CREATININE 1.28 07/03/2014 1042   CALCIUM 10.0 05/21/2020 1058   CALCIUM 9.4 07/03/2014 1042   PROT  7.6 05/21/2020 1058   PROT 8.5 (H) 07/03/2014 1042   ALBUMIN 4.7 05/21/2020 1058   ALBUMIN 4.3 07/03/2014 1042   AST 24 05/21/2020 1058   AST 56 (H) 07/03/2014 1042   ALT 15 05/21/2020 1058   ALT 57 07/03/2014 1042   ALKPHOS 85 05/21/2020 1058   ALKPHOS 92 07/03/2014 1042   BILITOT 0.4 05/21/2020 1058   BILITOT 0.7 07/03/2014 1042   GFRNONAA 67 05/21/2020 1058   GFRNONAA 59 (L) 07/03/2014 1042   GFRAA 78 05/21/2020 1058   GFRAA >60 07/03/2014 1042   Lab Results  Component Value Date   CHOL 195 05/06/2019   HDL 87 05/06/2019   LDLCALC 98 05/06/2019   TRIG 52 05/06/2019   CHOLHDL 2.2 05/06/2019   Lab Results  Component Value Date   HGBA1C 5.7 09/07/2010   No results found for: VITAMINB12 Lab Results  Component Value Date   TSH 1.63 07/03/2014      ASSESSMENT AND PLAN 79 y.o. year old male  has a past medical history of Anxiety, Arthritis, CAD (coronary artery disease), Carotid artery disease ([place]), Cataract, CHF (congestive heart failure) ([place]), COPD (chronic obstructive pulmonary disease) ([place]), Depression, Dyslipidemia, EtOH dependence ([place]), GERD (gastroesophageal reflux disease), Hyperlipidemia, Hypertension, Insomnia, Nonischemic cardiomyopathy ([place]), Pacemaker, Peptic ulcer disease, and Tobacco abuse. here with:  1. OSA on BiPAP  - BiPAP compliance excellent -Residual AHI has improved but remains slightly elevated at 7.1.  We will increase pressure to 14/10 centimeters of water -Mask refitting ordered - Encourage patient to use CPAP nightly and > 4 hours each night - F/U in 3 to 4 months or sooner if needed   I spent 25 minutes of face-to-face and non-face-to-face time with patient.  This included previsit chart review, lab review, study review, order entry, electronic health record documentation, patient education.  Ward Givens, MSN, NP-C 06/04/2020, 10:17 AM [facility] [address] [place], [place] [address] [phone]

## 2020-06-04 NOTE — Patient Instructions (Signed)
Continue using BiPAP nightly and greater than 4 hours each night If your symptoms worsen or you develop new symptoms please let us know.    

## 2020-06-16 NOTE — Progress Notes (Addendum)
Sent CM for [facility] for new cpap orders.Kathee Delton, RN got it, thanks

## 2020-06-17 DIAGNOSIS — G4737 Central sleep apnea in conditions classified elsewhere: Secondary | ICD-10-CM | POA: Diagnosis not present

## 2020-07-06 DIAGNOSIS — G4737 Central sleep apnea in conditions classified elsewhere: Secondary | ICD-10-CM | POA: Diagnosis not present

## 2020-07-07 NOTE — Progress Notes (Signed)
Cardiology Office Note   Date:  07/08/2020   ID:  Freeland, Pracht July 09, 1940, MRN [id number]  PCP:  Sharion Balloon, FNP  Cardiologist:   No primary care provider on file.   Chief Complaint  Patient presents with  . Cardiomyopathy      History of Present Illness: [name] is a 80 y.o. male who presents for follow up of non ischemic cardiomyopathy.Since I last saw him he says he is doing well.  The patient denies any new symptoms such as chest discomfort, neck or arm discomfort. There has been no new shortness of breath, PND or orthopnea. There have been no reported palpitations, presyncope or syncope.  He lives by himself and takes care of his own cares.  He still smokes 3rd-1/4 pack of cigarettes daily and drinks 5 Natural Lites or so a day.   He says that he is limited by an abnormal gait and joint pain.    Past Medical History:  Diagnosis Date  . Anxiety   . Arthritis   . CAD (coronary artery disease)    Nonobstructive cath 2012.  PCI to RCA 2003  . Carotid artery disease ([place])   . Cataract   . CHF (congestive heart failure) ([place])   . COPD (chronic obstructive pulmonary disease) ([place])   . Depression   . Dyslipidemia   . EtOH dependence ([place])   . GERD (gastroesophageal reflux disease)   . Hyperlipidemia   . Hypertension   . Insomnia   . Nonischemic cardiomyopathy (HCC)    EF was 20% (60% last echo 2015)  . Pacemaker    and defibrillator  . Peptic ulcer disease   . Tobacco abuse     Past Surgical History:  Procedure Laterality Date  . BIV ICD GENERTAOR CHANGE OUT N/A 03/06/2014   [employer] Gen change by Dr Rayann Heman Beckie Salts CRT-D with LV1 header)  . biventricular defibrillator implantation     [employer] and pacemaker  . CATARACT EXTRACTION W/PHACO Right 12/10/2015   Procedure: CATARACT EXTRACTION PHACO AND INTRAOCULAR LENS PLACEMENT RIGHT EYE; CDE: 19.30;  Surgeon: Tonny Branch, MD;  Location: [facility];  Service: Ophthalmology;   Laterality: Right;  . CATARACT EXTRACTION W/PHACO Left 01/14/2016   Procedure: CATARACT EXTRACTION PHACO AND INTRAOCULAR LENS PLACEMENT LEFT EYE; CDE:  8.03;  Surgeon: Tonny Branch, MD;  Location: [facility];  Service: Ophthalmology;  Laterality: Left;  . COLONOSCOPY    . EYE SURGERY    . Ulcer surgery     repair of stomach ulcer     Current Outpatient Medications  Medication Sig Dispense Refill  . albuterol (VENTOLIN HFA) 108 (90 Base) MCG/ACT inhaler Inhale 1 puff into the lungs every 6 (six) hours as needed for shortness of breath. 8 g 2  . ALPRAZolam (XANAX) 1 MG tablet Take 1 tablet (1 mg total) by mouth 2 (two) times daily as needed. Keep appt w/ Christy for refills 60 tablet 2  . amLODipine (NORVASC) 10 MG tablet Take 1 tablet (10 mg total) by mouth daily. 90 tablet 3  . aspirin 81 MG tablet Take 81 mg by mouth daily.    . cetirizine (ZYRTEC) 10 MG tablet Take 10 mg by mouth daily as needed for allergies.    Marland Kitchen lisinopril (ZESTRIL) 40 MG tablet Take 1 tablet (40 mg total) by mouth daily. Needs to be seen for further refills. 30 tablet 0  . metoprolol tartrate (LOPRESSOR) 100 MG tablet Take 1 tablet (100 mg total)  by mouth 2 (two) times daily. 180 tablet 3  . pantoprazole (PROTONIX) 40 MG tablet Take 1 tablet (40 mg total) by mouth daily. 90 tablet 3  . pravastatin (PRAVACHOL) 40 MG tablet Take 1 tablet (40 mg total) by mouth daily. 90 tablet 3   No current facility-administered medications for this visit.    Allergies:   Gabapentin, Sudafed [pseudoephedrine hcl], Prednisone, and Avelox [moxifloxacin hcl in nacl]    ROS:  Please see the history of present illness.   Otherwise, review of systems are positive for neuroapthy.   All other systems are reviewed and negative.    PHYSICAL EXAM: VS:  BP 138/72   Pulse (!) 57   Ht 5\' 8"  (1.727 m)   Wt 131 lb 6.4 oz (59.6 kg)   SpO2 99%   BMI 19.98 kg/m  , BMI Body mass index is 19.98 kg/m. GENERAL:  Well appearing NECK:  No jugular  venous distention, waveform within normal limits, carotid upstroke brisk and symmetric, no bruits, no thyromegaly LUNGS:  Clear to auscultation bilaterally CHEST:  Well healed ICD pocket. HEART:  PMI not displaced or sustained,S1 and S2 within normal limits, no S3, no S4, no clicks, no rubs, no murmurs ABD:  Flat, positive bowel sounds normal in frequency in pitch, no bruits, no rebound, no guarding, no midline pulsatile mass, no hepatomegaly, no splenomegaly EXT:  2 plus pulses throughout, no edema, no cyanosis no clubbing   EKG:  EKG is ordered today. The ekg ordered today demonstrates sinus rhythm, ventricular pacing 100% capture, rate 57   Recent Labs: 05/12/2020: Hemoglobin 12.0; Platelets 382 05/21/2020: ALT 15; BUN 15; Creatinine, Ser 1.07; Potassium 4.3; Sodium 130    Lipid Panel    Component Value Date/Time   CHOL 195 05/06/2019 0849   TRIG 52 05/06/2019 0849   TRIG CANCELED 01/31/2014 1618   HDL 87 05/06/2019 0849   HDL CANCELED 01/31/2014 1618   CHOLHDL 2.2 05/06/2019 0849   LDLCALC 98 05/06/2019 0849      Wt Readings from Last 3 Encounters:  07/08/20 131 lb 6.4 oz (59.6 kg)  06/04/20 132 lb 3.2 oz (60 kg)  05/12/20 131 lb (59.4 kg)      Other studies Reviewed: Additional studies/ records that were reviewed today include: None. Review of the above records demonstrates:  Please see elsewhere in the note.     ASSESSMENT AND PLAN:   VENTRICULAR TACHYCARDIA: He is up to date with ICD follow up.  CARDIOMOPATHY: His EF wasnormal in 2015. No change in therapy.  No further imaging.  He seems to be euvolemic and I would not suspect a change in his ejection fraction.  TOBACCO ABUSE: He and I talked about this again today and he might end up cutting back only because of the cost associated with it.  He understands the need to quit completely  DYSLIPIDEMIA: LDL is 98 but the HDL is 87.  He will continue on meds as listed.  HTN:  's blood pressure is at  target.  It does fluctuate somewhat so I will titrate his medications.  No change in therapy.   ETOH:  We talked about this today and I told him that he is drinking too much alcohol.   SLEEP APNEA: He now is using CPAP and says he sleeps through the night with this.   Current medicines are reviewed at length with the patient today.  The patient does not have concerns regarding medicines.  The following changes have been  made:  no change  Labs/ tests ordered today include: None  Orders Placed This Encounter  Procedures  . EKG 12-Lead     Disposition:   FU with me in 12 months.     Signed, Minus Breeding, MD  07/08/2020 11:01 AM    [place]

## 2020-07-08 ENCOUNTER — Encounter: Payer: Self-pay | Admitting: Cardiology

## 2020-07-08 ENCOUNTER — Other Ambulatory Visit: Payer: Self-pay

## 2020-07-08 ENCOUNTER — Ambulatory Visit (INDEPENDENT_AMBULATORY_CARE_PROVIDER_SITE_OTHER): Payer: Medicare Other | Admitting: Cardiology

## 2020-07-08 VITALS — BP 138/72 | HR 57 | Ht 68.0 in | Wt 131.4 lb

## 2020-07-08 DIAGNOSIS — I10 Essential (primary) hypertension: Secondary | ICD-10-CM

## 2020-07-08 DIAGNOSIS — I428 Other cardiomyopathies: Secondary | ICD-10-CM

## 2020-07-08 DIAGNOSIS — I472 Ventricular tachycardia, unspecified: Secondary | ICD-10-CM

## 2020-07-08 DIAGNOSIS — E785 Hyperlipidemia, unspecified: Secondary | ICD-10-CM

## 2020-07-08 DIAGNOSIS — Z720 Tobacco use: Secondary | ICD-10-CM

## 2020-07-08 NOTE — Patient Instructions (Signed)
Medication Instructions:  The current medical regimen is effective;  continue present plan and medications.  *If you need a refill on your cardiac medications before your next appointment, please call your pharmacy*  Follow-Up: At [facility], you and your health needs are our priority.  As part of our continuing mission to provide you with exceptional heart care, we have created designated Provider Care Teams.  These Care Teams include your primary Cardiologist (physician) and Advanced Practice Providers (APPs -  Physician Assistants and Nurse Practitioners) who all work together to provide you with the care you need, when you need it.  We recommend signing up for the patient portal called "MyChart".  Sign up information is provided on this After Visit Summary.  MyChart is used to connect with patients for Virtual Visits (Telemedicine).  Patients are able to view lab/test results, encounter notes, upcoming appointments, etc.  Non-urgent messages can be sent to your provider as well.   To learn more about what you can do with MyChart, go to [email or web address].    Your next appointment:   12 month(s)  The format for your next appointment:   In Person  Provider:   James Hochrein, MD   Thank you for choosing [facility]!!     

## 2020-07-16 DIAGNOSIS — G4737 Central sleep apnea in conditions classified elsewhere: Secondary | ICD-10-CM | POA: Diagnosis not present

## 2020-07-18 DIAGNOSIS — G4737 Central sleep apnea in conditions classified elsewhere: Secondary | ICD-10-CM | POA: Diagnosis not present

## 2020-07-27 ENCOUNTER — Ambulatory Visit (INDEPENDENT_AMBULATORY_CARE_PROVIDER_SITE_OTHER): Payer: Medicare Other

## 2020-07-27 DIAGNOSIS — I428 Other cardiomyopathies: Secondary | ICD-10-CM

## 2020-07-27 DIAGNOSIS — I472 Ventricular tachycardia, unspecified: Secondary | ICD-10-CM

## 2020-07-27 LAB — CUP PACEART REMOTE DEVICE CHECK
Battery Remaining Longevity: 42 mo
Battery Remaining Percentage: 66 %
Brady Statistic RA Percent Paced: 0 %
Brady Statistic RV Percent Paced: 100 %
Date Time Interrogation Session: 20220207003100
HighPow Impedance: 59 Ohm
Implantable Lead Implant Date: 20040312
Implantable Lead Implant Date: 20040312
Implantable Lead Implant Date: 20040312
Implantable Lead Location: 753858
Implantable Lead Location: 753859
Implantable Lead Location: 753860
Implantable Lead Model: 158
Implantable Lead Model: 4087
Implantable Lead Model: 4513
Implantable Lead Serial Number: 129158
Implantable Lead Serial Number: 209446
Implantable Lead Serial Number: 407264
Implantable Pulse Generator Implant Date: 20150917
Lead Channel Impedance Value: 573 Ohm
Lead Channel Impedance Value: 580 Ohm
Lead Channel Impedance Value: 984 Ohm
Lead Channel Pacing Threshold Amplitude: 0.6 V
Lead Channel Pacing Threshold Amplitude: 1 V
Lead Channel Pacing Threshold Amplitude: 1.9 V
Lead Channel Pacing Threshold Pulse Width: 0.4 ms
Lead Channel Pacing Threshold Pulse Width: 0.4 ms
Lead Channel Pacing Threshold Pulse Width: 0.8 ms
Lead Channel Setting Pacing Amplitude: 2 V
Lead Channel Setting Pacing Amplitude: 2.5 V
Lead Channel Setting Pacing Amplitude: 3 V
Lead Channel Setting Pacing Pulse Width: 0.4 ms
Lead Channel Setting Pacing Pulse Width: 0.8 ms
Lead Channel Setting Sensing Sensitivity: 0.6 mV
Lead Channel Setting Sensing Sensitivity: 1 mV
Pulse Gen Serial Number: 106298

## 2020-08-03 NOTE — Progress Notes (Signed)
Remote ICD transmission.   

## 2020-08-10 ENCOUNTER — Other Ambulatory Visit: Payer: Self-pay | Admitting: Family

## 2020-08-10 DIAGNOSIS — F411 Generalized anxiety disorder: Secondary | ICD-10-CM

## 2020-08-10 DIAGNOSIS — F1320 Sedative, hypnotic or anxiolytic dependence, uncomplicated: Secondary | ICD-10-CM

## 2020-08-10 DIAGNOSIS — Z79899 Other long term (current) drug therapy: Secondary | ICD-10-CM

## 2020-08-13 ENCOUNTER — Encounter: Payer: Self-pay | Admitting: Family

## 2020-08-13 ENCOUNTER — Other Ambulatory Visit: Payer: Self-pay

## 2020-08-13 ENCOUNTER — Ambulatory Visit (INDEPENDENT_AMBULATORY_CARE_PROVIDER_SITE_OTHER): Payer: Medicare Other | Admitting: Family

## 2020-08-13 VITALS — BP 151/70 | HR 76 | Temp 97.3°F | Ht 68.0 in | Wt 134.0 lb

## 2020-08-13 DIAGNOSIS — Z79899 Other long term (current) drug therapy: Secondary | ICD-10-CM

## 2020-08-13 DIAGNOSIS — G621 Alcoholic polyneuropathy: Secondary | ICD-10-CM | POA: Diagnosis not present

## 2020-08-13 DIAGNOSIS — I25119 Atherosclerotic heart disease of native coronary artery with unspecified angina pectoris: Secondary | ICD-10-CM

## 2020-08-13 DIAGNOSIS — F17200 Nicotine dependence, unspecified, uncomplicated: Secondary | ICD-10-CM | POA: Diagnosis not present

## 2020-08-13 DIAGNOSIS — F17210 Nicotine dependence, cigarettes, uncomplicated: Secondary | ICD-10-CM

## 2020-08-13 DIAGNOSIS — F1010 Alcohol abuse, uncomplicated: Secondary | ICD-10-CM

## 2020-08-13 DIAGNOSIS — I700 Atherosclerosis of aorta: Secondary | ICD-10-CM | POA: Diagnosis not present

## 2020-08-13 DIAGNOSIS — I428 Other cardiomyopathies: Secondary | ICD-10-CM

## 2020-08-13 DIAGNOSIS — I10 Essential (primary) hypertension: Secondary | ICD-10-CM

## 2020-08-13 DIAGNOSIS — G4700 Insomnia, unspecified: Secondary | ICD-10-CM

## 2020-08-13 DIAGNOSIS — F1320 Sedative, hypnotic or anxiolytic dependence, uncomplicated: Secondary | ICD-10-CM | POA: Diagnosis not present

## 2020-08-13 DIAGNOSIS — K219 Gastro-esophageal reflux disease without esophagitis: Secondary | ICD-10-CM | POA: Diagnosis not present

## 2020-08-13 DIAGNOSIS — F411 Generalized anxiety disorder: Secondary | ICD-10-CM

## 2020-08-13 DIAGNOSIS — E785 Hyperlipidemia, unspecified: Secondary | ICD-10-CM

## 2020-08-13 DIAGNOSIS — R0681 Apnea, not elsewhere classified: Secondary | ICD-10-CM

## 2020-08-13 MED ORDER — PREGABALIN 75 MG PO CAPS: 75.0000 mg | ORAL_CAPSULE | Freq: Two times a day (BID) | ORAL | 2 refills | Status: DC

## 2020-08-13 MED ORDER — ALPRAZOLAM 1 MG PO TABS: 1.0000 mg | ORAL_TABLET | Freq: Two times a day (BID) | ORAL | 2 refills | Status: DC | PRN

## 2020-08-13 NOTE — Progress Notes (Signed)
Subjective:    Patient ID: [name], male    DOB: Feb 22, 1941, 80 y.o.   MRN: [id number]  Chief Complaint  Patient presents with  . Hypertension   Pt presents to the office today for chronic follow up. PT is followed by Cardiologists for CAD, CHF, Atherosclerosis, Cardiomyopathy, and Ventricular tachycardia every 2 years. PT has pacemaker and is followed annually for this.   Pt is followed by Dermatologists forpsoriasis.  He does report drinking 5-6 beers a day. Hypertension This is a chronic problem. The current episode started more than 1 year ago. The problem is unchanged. The problem is uncontrolled. Associated symptoms include anxiety and malaise/fatigue. Pertinent negatives include no peripheral edema or shortness of breath. Risk factors for coronary artery disease include dyslipidemia, male gender and sedentary lifestyle. The current treatment provides moderate improvement.  Congestive Heart Failure Presents for follow-up visit. Associated symptoms include fatigue. Pertinent negatives include no edema or shortness of breath. The symptoms have been stable.  Gastroesophageal Reflux He complains of belching and heartburn. This is a chronic problem. The current episode started more than 1 year ago. The problem occurs occasionally. Associated symptoms include fatigue. He has tried a PPI for the symptoms. The treatment provided moderate relief.  Hyperlipidemia This is a chronic problem. The current episode started more than 1 year ago. The problem is controlled. Recent lipid tests were reviewed and are normal. Pertinent negatives include no shortness of breath. The current treatment provides moderate improvement of lipids. Risk factors for coronary artery disease include dyslipidemia, male sex, a sedentary lifestyle and hypertension.  Anxiety Presents for follow-up visit. Symptoms include depressed mood, excessive worry, irritability and panic. Patient reports no shortness of  breath. Symptoms occur occasionally. The severity of symptoms is moderate.    Alcoholic neuropathy Pt complaining of burning pain 5 out 10.     Review of Systems  Constitutional: Positive for fatigue, irritability and malaise/fatigue.  Respiratory: Negative for shortness of breath.   Gastrointestinal: Positive for heartburn.  All other systems reviewed and are negative.      Objective:   Physical Exam Vitals reviewed.  Constitutional:      General: He is not in acute distress.    Appearance: He is well-developed and well-nourished.  HENT:     Head: Normocephalic.     Right Ear: Tympanic membrane normal.     Left Ear: Tympanic membrane normal.     Mouth/Throat:     Mouth: Oropharynx is clear and moist.  Eyes:     General:        Right eye: No discharge.        Left eye: No discharge.     Pupils: Pupils are equal, round, and reactive to light.  Neck:     Thyroid: No thyromegaly.  Cardiovascular:     Rate and Rhythm: Normal rate and regular rhythm.     Pulses: Intact distal pulses.     Heart sounds: Murmur heard.    Pulmonary:     Effort: Pulmonary effort is normal. No respiratory distress.     Breath sounds: Normal breath sounds. No wheezing.  Abdominal:     General: Bowel sounds are normal. There is no distension.     Palpations: Abdomen is soft.     Tenderness: There is no abdominal tenderness.  Musculoskeletal:        General: No tenderness or edema. Normal range of motion.     Cervical back: Normal range of motion and neck supple.  Skin:    General: Skin is warm and dry.     Findings: No erythema or rash.  Neurological:     Mental Status: He is alert and oriented to person, place, and time.     Cranial Nerves: No cranial nerve deficit.     Deep Tendon Reflexes: Reflexes are normal and symmetric.  Psychiatric:        Mood and Affect: Mood and affect normal.        Behavior: Behavior normal.        Thought Content: Thought content normal.        Judgment:  Judgment normal.       BP (!) 151/70   Pulse 76   Temp (!) 97.3 F (36.3 C) (Temporal)   Ht '5\' 8"'  (1.727 m)   Wt 134 lb (60.8 kg)   BMI 20.37 kg/m      Assessment & Plan:  [name] comes in today with chief complaint of Hypertension   Diagnosis and orders addressed:  1. GAD (generalized anxiety disorder) - ALPRAZolam (XANAX) 1 MG tablet; Take 1 tablet (1 mg total) by mouth 2 (two) times daily as needed. Keep appt w/ Alyse Low for refills  Dispense: 60 tablet; Refill: 2 - CMP14+EGFR  2. Controlled substance agreement signed - ALPRAZolam (XANAX) 1 MG tablet; Take 1 tablet (1 mg total) by mouth 2 (two) times daily as needed. Keep appt w/ Alyse Low for refills  Dispense: 60 tablet; Refill: 2 - CMP14+EGFR  3. Benzodiazepine dependence (HCC) - ALPRAZolam (XANAX) 1 MG tablet; Take 1 tablet (1 mg total) by mouth 2 (two) times daily as needed. Keep appt w/ Alyse Low for refills  Dispense: 60 tablet; Refill: 2 - CMP14+EGFR  4. Atherosclerosis of native coronary artery with angina pectoris, unspecified whether native or transplanted heart (HCC) - CMP14+EGFR - Lipid panel  5. Essential hypertension - CMP14+EGFR  6. Thoracic aorta atherosclerosis (HCC) - CMP14+EGFR - Lipid panel  7. Nonischemic cardiomyopathy (HCC) - CMP14+EGFR  8. GERD with apnea  - CMP14+EGFR  9. Alcoholic peripheral neuropathy ([place]) Will try Lyrica today to see if helps - CMP14+EGFR - Anemia Profile B - pregabalin (LYRICA) 75 MG capsule; Take 1 capsule (75 mg total) by mouth 2 (two) times daily.  Dispense: 60 capsule; Refill: 2  10. Hyperlipidemia, unspecified hyperlipidemia type - CMP14+EGFR - Lipid panel  11. Alcohol abuse - CMP14+EGFR  12. TOBACCO ABUSE - CMP14+EGFR  13. Insomnia, unspecified type - CMP14+EGFR  14. Tobacco dependence due to cigarettes - CMP14+EGFR   Labs pending Health Maintenance reviewed Diet and exercise encouraged  Follow up plan: 3 months    Evelina Dun, FNP

## 2020-08-13 NOTE — Patient Instructions (Signed)
Neuropathic Pain Neuropathic pain is pain caused by damage to the nerves that are responsible for certain sensations in your body (sensory nerves). The pain can be caused by:  Damage to the sensory nerves that send signals to your spinal cord and brain (peripheral nervous system).  Damage to the sensory nerves in your brain or spinal cord (central nervous system). Neuropathic pain can make you more sensitive to pain. Even a minor sensation can feel very painful. This is usually a long-term condition that can be difficult to treat. The type of pain differs from person to person. It may:  Start suddenly (acute), or it may develop slowly and last for a long time (chronic).  Come and go as damaged nerves heal, or it may stay at the same level for years.  Cause emotional distress, loss of sleep, and a lower quality of life. What are the causes? The most common cause of this condition is diabetes. Many other diseases and conditions can also cause neuropathic pain. Causes of neuropathic pain can be classified as:  Toxic. This is caused by medicines and chemicals. The most common cause of toxic neuropathic pain is damage from cancer treatments (chemotherapy).  Metabolic. This can be caused by: ? Diabetes. This is the most common disease that damages the nerves. ? Lack of vitamin B from long-term alcohol abuse.  Traumatic. Any injury that cuts, crushes, or stretches a nerve can cause damage and pain. A common example is feeling pain after losing an arm or leg (phantom limb pain).  Compression-related. If a sensory nerve gets trapped or compressed for a long period of time, the blood supply to the nerve can be cut off.  Vascular. Many blood vessel diseases can cause neuropathic pain by decreasing blood supply and oxygen to nerves.  Autoimmune. This type of pain results from diseases in which the body's defense system (immune system) mistakenly attacks sensory nerves. Examples of autoimmune diseases  that can cause neuropathic pain include lupus and multiple sclerosis.  Infectious. Many types of viral infections can damage sensory nerves and cause pain. Shingles infection is a common cause of this type of pain.  Inherited. Neuropathic pain can be a symptom of many diseases that are passed down through families (genetic). What increases the risk? You are more likely to develop this condition if:  You have diabetes.  You smoke.  You drink too much alcohol.  You are taking certain medicines, including medicines that kill cancer cells (chemotherapy) or that treat immune system disorders. What are the signs or symptoms? The main symptom is pain. Neuropathic pain is often described as:  Burning.  Shock-like.  Stinging.  Hot or cold.  Itching. How is this diagnosed? No single test can diagnose neuropathic pain. It is diagnosed based on:  Physical exam and your symptoms. Your health care provider will ask you about your pain. You may be asked to use a pain scale to describe how bad your pain is.  Tests. These may be done to see if you have a high sensitivity to pain and to help find the cause and location of any sensory nerve damage. They include: ? Nerve conduction studies to test how well nerve signals travel through your sensory nerves (electrodiagnostic testing). ? Stimulating your sensory nerves through electrodes on your skin and measuring the response in your spinal cord and brain (somatosensory evoked potential).  Imaging studies, such as: ? X-rays. ? CT scan. ? MRI. How is this treated? Treatment for neuropathic pain may change   over time. You may need to try different treatment options or a combination of treatments. Some options include:  Treating the underlying cause of the neuropathy, such as diabetes, kidney disease, or vitamin deficiencies.  Stopping medicines that can cause neuropathy, such as chemotherapy.  Medicine to relieve pain. Medicines may  include: ? Prescription or over-the-counter pain medicine. ? Anti-seizure medicine. ? Antidepressant medicines. ? Pain-relieving patches that are applied to painful areas of skin. ? A medicine to numb the area (local anesthetic), which can be injected as a nerve block.  Transcutaneous nerve stimulation. This uses electrical currents to block painful nerve signals. The treatment is painless.  Alternative treatments, such as: ? Acupuncture. ? Meditation. ? Massage. ? Physical therapy. ? Pain management programs. ? Counseling. Follow these instructions at home: Medicines  Take over-the-counter and prescription medicines only as told by your health care provider.  Do not drive or use heavy machinery while taking prescription pain medicine.  If you are taking prescription pain medicine, take actions to prevent or treat constipation. Your health care provider may recommend that you: ? Drink enough fluid to keep your urine pale yellow. ? Eat foods that are high in fiber, such as fresh fruits and vegetables, whole grains, and beans. ? Limit foods that are high in fat and processed sugars, such as fried or sweet foods. ? Take an over-the-counter or prescription medicine for constipation.   Lifestyle  Have a good support system at home.  Consider joining a chronic pain support group.  Do not use any products that contain nicotine or tobacco, such as cigarettes and e-cigarettes. If you need help quitting, ask your health care provider.  Do not drink alcohol.   General instructions  Learn as much as you can about your condition.  Work closely with all your health care providers to find the treatment plan that works best for you.  Ask your health care provider what activities are safe for you.  Keep all follow-up visits as told by your health care provider. This is important. Contact a health care provider if:  Your pain treatments are not working.  You are having side effects  from your medicines.  You are struggling with tiredness (fatigue), mood changes, depression, or anxiety. Summary  Neuropathic pain is pain caused by damage to the nerves that are responsible for certain sensations in your body (sensory nerves).  Neuropathic pain may come and go as damaged nerves heal, or it may stay at the same level for years.  Neuropathic pain is usually a long-term condition that can be difficult to treat. Consider joining a chronic pain support group. This information is not intended to replace advice given to you by your health care provider. Make sure you discuss any questions you have with your health care provider. Document Revised: 09/27/2018 Document Reviewed: 06/23/2017 Elsevier Patient Education  2021 [employer].

## 2020-08-14 ENCOUNTER — Other Ambulatory Visit: Payer: Self-pay | Admitting: Family

## 2020-08-14 ENCOUNTER — Other Ambulatory Visit: Payer: Self-pay | Admitting: Family Medicine

## 2020-08-14 DIAGNOSIS — E871 Hypo-osmolality and hyponatremia: Secondary | ICD-10-CM

## 2020-08-14 LAB — ANEMIA PROFILE B
Basophils Absolute: 0.1 10*3/uL (ref 0.0–0.2)
Basos: 1 %
EOS (ABSOLUTE): 0.1 10*3/uL (ref 0.0–0.4)
Eos: 2 %
Ferritin: 37 ng/mL (ref 30–400)
Folate: >20 ng/mL (ref >3.0)
Hematocrit: 34.1 % — ABNORMAL LOW (ref 37.5–51.0)
Hemoglobin: 11.5 g/dL — ABNORMAL LOW (ref 13.0–17.7)
Immature Grans (Abs): 0 10*3/uL (ref 0.0–0.1)
Immature Granulocytes: 1 %
Iron Saturation: 24 % (ref 15–55)
Iron: 98 ug/dL (ref 38–169)
Lymphocytes Absolute: 1.8 10*3/uL (ref 0.7–3.1)
Lymphs: 28 %
MCH: 31.6 pg (ref 26.6–33.0)
MCHC: 33.7 g/dL (ref 31.5–35.7)
MCV: 94 fL (ref 79–97)
Monocytes Absolute: 1 10*3/uL — ABNORMAL HIGH (ref 0.1–0.9)
Monocytes: 15 %
Neutrophils Absolute: 3.5 10*3/uL (ref 1.4–7.0)
Neutrophils: 53 %
Platelets: 317 10*3/uL (ref 150–450)
RBC: 3.64 x10E6/uL — ABNORMAL LOW (ref 4.14–5.80)
RDW: 13.8 % (ref 11.6–15.4)
Retic Ct Pct: 1.3 % (ref 0.6–2.6)
Total Iron Binding Capacity: 404 ug/dL (ref 250–450)
UIBC: 306 ug/dL (ref 111–343)
Vitamin B-12: 1091 pg/mL (ref 232–1245)
WBC: 6.5 10*3/uL (ref 3.4–10.8)

## 2020-08-14 LAB — CMP14+EGFR
ALT: 27 IU/L (ref 0–44)
AST: 39 IU/L (ref 0–40)
Albumin/Globulin Ratio: 1.6 (ref 1.2–2.2)
Albumin: 4.5 g/dL (ref 3.7–4.7)
Alkaline Phosphatase: 84 IU/L (ref 44–121)
BUN/Creatinine Ratio: 12 (ref 10–24)
BUN: 11 mg/dL (ref 8–27)
Bilirubin Total: 0.5 mg/dL (ref 0.0–1.2)
CO2: 20 mmol/L (ref 20–29)
Calcium: 9.6 mg/dL (ref 8.6–10.2)
Chloride: 88 mmol/L — ABNORMAL LOW (ref 96–106)
Creatinine, Ser: 0.92 mg/dL (ref 0.76–1.27)
GFR calc Af Amer: 92 mL/min/{1.73_m2} (ref >59)
GFR calc non Af Amer: 80 mL/min/{1.73_m2} (ref >59)
Globulin, Total: 2.8 g/dL (ref 1.5–4.5)
Glucose: 106 mg/dL — ABNORMAL HIGH (ref 65–99)
Potassium: 4.5 mmol/L (ref 3.5–5.2)
Sodium: 125 mmol/L — ABNORMAL LOW (ref 134–144)
Total Protein: 7.3 g/dL (ref 6.0–8.5)

## 2020-08-14 LAB — LIPID PANEL
Chol/HDL Ratio: 2 ratio (ref 0.0–5.0)
Cholesterol, Total: 194 mg/dL (ref 100–199)
HDL: 97 mg/dL (ref >39)
LDL Chol Calc (NIH): 89 mg/dL (ref 0–99)
Triglycerides: 42 mg/dL (ref 0–149)
VLDL Cholesterol Cal: 8 mg/dL (ref 5–40)

## 2020-08-14 MED ORDER — SODIUM CHLORIDE 1 G PO TABS: 2.0000 g | ORAL_TABLET | Freq: Three times a day (TID) | ORAL | 0 refills | Status: AC

## 2020-08-17 DIAGNOSIS — G4737 Central sleep apnea in conditions classified elsewhere: Secondary | ICD-10-CM | POA: Diagnosis not present

## 2020-08-18 ENCOUNTER — Other Ambulatory Visit: Payer: Medicare Other

## 2020-08-18 ENCOUNTER — Other Ambulatory Visit: Payer: Self-pay

## 2020-08-18 DIAGNOSIS — E871 Hypo-osmolality and hyponatremia: Secondary | ICD-10-CM

## 2020-08-18 LAB — CMP14+EGFR
ALT: 25 IU/L (ref 0–44)
AST: 37 IU/L (ref 0–40)
Albumin/Globulin Ratio: 1.5 (ref 1.2–2.2)
Albumin: 4.2 g/dL (ref 3.7–4.7)
Alkaline Phosphatase: 87 IU/L (ref 44–121)
BUN/Creatinine Ratio: 16 (ref 10–24)
BUN: 19 mg/dL (ref 8–27)
Bilirubin Total: 0.3 mg/dL (ref 0.0–1.2)
CO2: 22 mmol/L (ref 20–29)
Calcium: 9.6 mg/dL (ref 8.6–10.2)
Chloride: 95 mmol/L — ABNORMAL LOW (ref 96–106)
Creatinine, Ser: 1.2 mg/dL (ref 0.76–1.27)
Globulin, Total: 2.8 g/dL (ref 1.5–4.5)
Glucose: 121 mg/dL — ABNORMAL HIGH (ref 65–99)
Potassium: 5 mmol/L (ref 3.5–5.2)
Sodium: 131 mmol/L — ABNORMAL LOW (ref 134–144)
Total Protein: 7 g/dL (ref 6.0–8.5)
eGFR: 62 mL/min/{1.73_m2} (ref >59)

## 2020-08-21 ENCOUNTER — Encounter: Payer: Self-pay | Admitting: Internal Medicine

## 2020-08-21 ENCOUNTER — Ambulatory Visit: Payer: Medicare Other | Admitting: Internal Medicine

## 2020-08-21 VITALS — BP 120/60 | HR 66 | Ht 67.0 in | Wt 132.8 lb

## 2020-08-21 DIAGNOSIS — I428 Other cardiomyopathies: Secondary | ICD-10-CM

## 2020-08-21 DIAGNOSIS — I472 Ventricular tachycardia, unspecified: Secondary | ICD-10-CM

## 2020-08-21 LAB — CUP PACEART INCLINIC DEVICE CHECK
Brady Statistic RA Percent Paced: 1 % — CL
Brady Statistic RV Percent Paced: 100 %
Date Time Interrogation Session: 20220304104818
Implantable Lead Implant Date: 20040312
Implantable Lead Implant Date: 20040312
Implantable Lead Implant Date: 20040312
Implantable Lead Location: 753858
Implantable Lead Location: 753859
Implantable Lead Location: 753860
Implantable Lead Model: 158
Implantable Lead Model: 4087
Implantable Lead Model: 4513
Implantable Lead Serial Number: 129158
Implantable Lead Serial Number: 209446
Implantable Lead Serial Number: 407264
Implantable Pulse Generator Implant Date: 20150917
Lead Channel Pacing Threshold Amplitude: 0.7 V
Lead Channel Pacing Threshold Amplitude: 1.2 V
Lead Channel Pacing Threshold Amplitude: 2.2 V
Lead Channel Pacing Threshold Pulse Width: 0.4 ms
Lead Channel Pacing Threshold Pulse Width: 0.4 ms
Lead Channel Pacing Threshold Pulse Width: 0.8 ms
Lead Channel Sensing Intrinsic Amplitude: 2.7 mV
Lead Channel Sensing Intrinsic Amplitude: 23.7 mV
Lead Channel Sensing Intrinsic Amplitude: 24.8 mV
Pulse Gen Serial Number: 106298

## 2020-08-21 NOTE — Progress Notes (Signed)
PCP: Sharion Balloon, FNP Primary Cardiologist: Dr Percival Spanish Primary EP: Dr Theressa Millard is a 80 y.o. male who presents today for routine electrophysiology followup.  Since last being seen in our clinic, the patient reports doing very well.  Today, he denies symptoms of palpitations, chest pain, shortness of breath,  lower extremity edema, dizziness, presyncope, syncope, or ICD shocks.  The patient is otherwise without complaint today.   Past Medical History:  Diagnosis Date  . Anxiety   . Arthritis   . CAD (coronary artery disease)    Nonobstructive cath 2012.  PCI to RCA 2003  . Carotid artery disease ([place])   . Cataract   . CHF (congestive heart failure) ([place])   . COPD (chronic obstructive pulmonary disease) ([place])   . Depression   . Dyslipidemia   . EtOH dependence ([place])   . GERD (gastroesophageal reflux disease)   . Hyperlipidemia   . Hypertension   . Insomnia   . Nonischemic cardiomyopathy (HCC)    EF was 20% (60% last echo 2015)  . Pacemaker    and defibrillator  . Peptic ulcer disease   . Tobacco abuse    Past Surgical History:  Procedure Laterality Date  . BIV ICD GENERTAOR CHANGE OUT N/A 03/06/2014   [employer] Gen change by Dr Rayann Heman Beckie Salts CRT-D with LV1 header)  . biventricular defibrillator implantation     [employer] and pacemaker  . CATARACT EXTRACTION W/PHACO Right 12/10/2015   Procedure: CATARACT EXTRACTION PHACO AND INTRAOCULAR LENS PLACEMENT RIGHT EYE; CDE: 19.30;  Surgeon: Tonny Branch, MD;  Location: [facility];  Service: Ophthalmology;  Laterality: Right;  . CATARACT EXTRACTION W/PHACO Left 01/14/2016   Procedure: CATARACT EXTRACTION PHACO AND INTRAOCULAR LENS PLACEMENT LEFT EYE; CDE:  8.03;  Surgeon: Tonny Branch, MD;  Location: [facility];  Service: Ophthalmology;  Laterality: Left;  . COLONOSCOPY    . EYE SURGERY    . Ulcer surgery     repair of stomach ulcer    ROS- all systems are reviewed and negative except as per HPI  above  Current Outpatient Medications  Medication Sig Dispense Refill  . albuterol (VENTOLIN HFA) 108 (90 Base) MCG/ACT inhaler Inhale 1 puff into the lungs every 6 (six) hours as needed for shortness of breath. 8 g 2  . ALPRAZolam (XANAX) 1 MG tablet Take 1 tablet (1 mg total) by mouth 2 (two) times daily as needed. Keep appt w/ Christy for refills 60 tablet 2  . amLODipine (NORVASC) 10 MG tablet Take 1 tablet (10 mg total) by mouth daily. 90 tablet 3  . aspirin 81 MG tablet Take 81 mg by mouth daily.    . cetirizine (ZYRTEC) 10 MG tablet Take 10 mg by mouth daily as needed for allergies.    . clobetasol cream (TEMOVATE) 0.21 % Apply 1 application topically 2 (two) times daily.    Marland Kitchen lisinopril (ZESTRIL) 40 MG tablet Take 1 tablet (40 mg total) by mouth daily. Needs to be seen for further refills. 30 tablet 0  . metoprolol tartrate (LOPRESSOR) 100 MG tablet Take 1 tablet (100 mg total) by mouth 2 (two) times daily. 180 tablet 3  . pantoprazole (PROTONIX) 40 MG tablet Take 1 tablet (40 mg total) by mouth daily. 90 tablet 3  . pravastatin (PRAVACHOL) 40 MG tablet Take 1 tablet (40 mg total) by mouth daily. 90 tablet 3  . pregabalin (LYRICA) 75 MG capsule Take 1 capsule (75 mg total) by mouth 2 (two)  times daily. 60 capsule 2  . sodium chloride 1 g tablet Take 2 tablets (2 g total) by mouth 3 (three) times daily. 180 tablet 0   No current facility-administered medications for this visit.    Physical Exam: Vitals:   08/21/20 1023  BP: 120/60  Pulse: 66  Weight: 132 lb 12.8 oz (60.2 kg)  Height: 5\' 7"  (1.702 m)    GEN- The patient is well appearing, alert and oriented x 3 today.   Head- normocephalic, atraumatic Eyes-  Sclera clear, conjunctiva pink Ears- hearing intact Oropharynx- clear Lungs- Clear to ausculation bilaterally, normal work of breathing Chest- ICD pocket is well healed Heart- Regular rate and rhythm, no murmurs, rubs or gallops, PMI not laterally displaced GI- soft,  NT, ND, + BS Extremities- no clubbing, cyanosis, or edema  ICD interrogation- reviewed in detail today,  See PACEART report  ekg tracing ordered today is personally reviewed and shows sinus with BiV pacing  Wt Readings from Last 3 Encounters:  08/21/20 132 lb 12.8 oz (60.2 kg)  08/13/20 134 lb (60.8 kg)  07/08/20 131 lb 6.4 oz (59.6 kg)    Assessment and Plan:  1.  Chronic systolic dysfunction/ nonischemic CM/ prior VT euvolemic today Stable on an appropriate medical regimen No sustained arrhythmias Normal ICD function See Pace Art report No changes today he is not device dependant today Could consider entresto/ forsega.  As his EF has normalized and he is doing well,  May be best to continue current regimen I will defer to Dr Percival Spanish  2. HTN Stable No change required today  3. HL Continue statin  Risks, benefits and potential toxicities for medications prescribed and/or refilled reviewed with patient today.    Return in a year  [name] MD, [facility] 08/21/2020 10:43 AM

## 2020-08-21 NOTE — Patient Instructions (Signed)
Medication Instructions:  Continue all current medications.  Labwork: none  Testing/Procedures: none  Follow-Up: 1 year   Any Other Special Instructions Will Be Listed Below (If Applicable).  If you need a refill on your cardiac medications before your next appointment, please call your pharmacy.  

## 2020-09-15 DIAGNOSIS — G4737 Central sleep apnea in conditions classified elsewhere: Secondary | ICD-10-CM | POA: Diagnosis not present

## 2020-09-16 DIAGNOSIS — G4737 Central sleep apnea in conditions classified elsewhere: Secondary | ICD-10-CM | POA: Diagnosis not present

## 2020-09-29 ENCOUNTER — Telehealth: Payer: Medicare Other | Admitting: Adult Health

## 2020-09-29 NOTE — Progress Notes (Incomplete)
  [facility] [address] [place] [address] [phone]     Virtual Visit via Telephone Note  I connected with [name] on 09/29/20 at  2:00 PM EDT by telephone located remotely at [facility] and verified that I am speaking with the correct person using two identifiers who reports being located at home   Visit scheduled by RN. She discussed the limitations, risks, security and privacy concerns of performing an evaluation and management service by telephone and the availability of in person appointments. I also discussed with the patient that there may be a patient responsible charge related to this service. The patient expressed understanding and agreed to proceed. See telephone note for consent and additional scheduling information.    History of Present Illness:  GWYN MEHRING is a 80 y.o. male who has been followed in this office for was initially scheduled for a video visit however he is unable to participate in video visit due to lack of access to device with camera.    Mr. Berry is a 80 year old male with a history of obstructive sleep apnea on CPAP.  He reports that the CPAP is working well for him.  He denies any new issues.  His CPAP report is below      HISTORY:06/04/20:  Mr. Lanum is a 80 year old male with a history of obstructive sleep apnea on BiPAP.  His download indicates that he uses his machine 29 out of 30 days for compliance of 97%.  He uses machine greater than 4 hours 28 days for compliance of 93%.  On average he uses his machine 9 hours and 29 minutes.  His residual AHI is 7.1 on 13/9 centimeters of water with pressure support of 4.  His leak in the 95th percentile is 59.5 L/min.  He states that the mask does not fit appropriately.  It does hurt the bridge of his nose.  He states that he has noticed the benefit.  He returns today for an evaluation.    Observations/Objective:  Generalized: Well  developed, in no acute distress   Neurological examination  Mentation: Alert oriented to time, place, history taking. Follows all commands speech and language fluent  Assessment and Plan:  1:  OSA on BIPAP  -Good compliance -For treatment of apnea -Encouraged use CPAP nightly and greater than 4 hours each night -Follow-up in 1 year or sooner if needed   Follow Up Instructions:   F/U in 1 year    I discussed the assessment and treatment plan with the patient.  The patient was provided an opportunity to ask questions and all were answered to their satisfaction. The patient agreed with the plan and verbalized an understanding of the instructions.   I provided 10 minutes of non-face-to-face time during this encounter.    Ward Givens NP-C  [facility] [address] [place], [place] [address]  Phone [phone] Fax [phone] \

## 2020-10-16 DIAGNOSIS — G4737 Central sleep apnea in conditions classified elsewhere: Secondary | ICD-10-CM | POA: Diagnosis not present

## 2020-10-23 ENCOUNTER — Other Ambulatory Visit: Payer: Self-pay | Admitting: Internal Medicine

## 2020-10-23 DIAGNOSIS — I10 Essential (primary) hypertension: Secondary | ICD-10-CM

## 2020-10-26 ENCOUNTER — Ambulatory Visit (INDEPENDENT_AMBULATORY_CARE_PROVIDER_SITE_OTHER): Payer: Medicare Other

## 2020-10-26 DIAGNOSIS — I428 Other cardiomyopathies: Secondary | ICD-10-CM

## 2020-10-27 LAB — CUP PACEART REMOTE DEVICE CHECK
Battery Remaining Longevity: 42 mo
Battery Remaining Percentage: 64 %
Brady Statistic RA Percent Paced: 0 %
Brady Statistic RV Percent Paced: 99 %
Date Time Interrogation Session: 20220509003100
HighPow Impedance: 58 Ohm
Implantable Lead Implant Date: 20040312
Implantable Lead Implant Date: 20040312
Implantable Lead Implant Date: 20040312
Implantable Lead Location: 753858
Implantable Lead Location: 753859
Implantable Lead Location: 753860
Implantable Lead Model: 158
Implantable Lead Model: 4087
Implantable Lead Model: 4513
Implantable Lead Serial Number: 129158
Implantable Lead Serial Number: 209446
Implantable Lead Serial Number: 407264
Implantable Pulse Generator Implant Date: 20150917
Lead Channel Impedance Value: 569 Ohm
Lead Channel Impedance Value: 600 Ohm
Lead Channel Impedance Value: 946 Ohm
Lead Channel Pacing Threshold Amplitude: 0.7 V
Lead Channel Pacing Threshold Amplitude: 1.2 V
Lead Channel Pacing Threshold Amplitude: 2.2 V
Lead Channel Pacing Threshold Pulse Width: 0.4 ms
Lead Channel Pacing Threshold Pulse Width: 0.4 ms
Lead Channel Pacing Threshold Pulse Width: 0.8 ms
Lead Channel Setting Pacing Amplitude: 2 V
Lead Channel Setting Pacing Amplitude: 2.5 V
Lead Channel Setting Pacing Amplitude: 3 V
Lead Channel Setting Pacing Pulse Width: 0.4 ms
Lead Channel Setting Pacing Pulse Width: 0.8 ms
Lead Channel Setting Sensing Sensitivity: 0.6 mV
Lead Channel Setting Sensing Sensitivity: 1 mV
Pulse Gen Serial Number: 106298

## 2020-10-29 ENCOUNTER — Other Ambulatory Visit: Payer: Self-pay | Admitting: Family

## 2020-10-29 DIAGNOSIS — K219 Gastro-esophageal reflux disease without esophagitis: Secondary | ICD-10-CM

## 2020-11-09 ENCOUNTER — Encounter: Payer: Self-pay | Admitting: Family

## 2020-11-09 ENCOUNTER — Ambulatory Visit (INDEPENDENT_AMBULATORY_CARE_PROVIDER_SITE_OTHER): Payer: Medicare Other | Admitting: Family

## 2020-11-09 ENCOUNTER — Other Ambulatory Visit: Payer: Self-pay

## 2020-11-09 VITALS — BP 133/66 | HR 65 | Temp 97.6°F | Ht 67.0 in | Wt 137.2 lb

## 2020-11-09 DIAGNOSIS — F17200 Nicotine dependence, unspecified, uncomplicated: Secondary | ICD-10-CM | POA: Diagnosis not present

## 2020-11-09 DIAGNOSIS — I25119 Atherosclerotic heart disease of native coronary artery with unspecified angina pectoris: Secondary | ICD-10-CM

## 2020-11-09 DIAGNOSIS — G4700 Insomnia, unspecified: Secondary | ICD-10-CM | POA: Diagnosis not present

## 2020-11-09 DIAGNOSIS — F1320 Sedative, hypnotic or anxiolytic dependence, uncomplicated: Secondary | ICD-10-CM

## 2020-11-09 DIAGNOSIS — I700 Atherosclerosis of aorta: Secondary | ICD-10-CM

## 2020-11-09 DIAGNOSIS — E785 Hyperlipidemia, unspecified: Secondary | ICD-10-CM | POA: Diagnosis not present

## 2020-11-09 DIAGNOSIS — F411 Generalized anxiety disorder: Secondary | ICD-10-CM

## 2020-11-09 DIAGNOSIS — I10 Essential (primary) hypertension: Secondary | ICD-10-CM | POA: Diagnosis not present

## 2020-11-09 DIAGNOSIS — Z79899 Other long term (current) drug therapy: Secondary | ICD-10-CM

## 2020-11-09 DIAGNOSIS — G621 Alcoholic polyneuropathy: Secondary | ICD-10-CM | POA: Diagnosis not present

## 2020-11-09 DIAGNOSIS — K219 Gastro-esophageal reflux disease without esophagitis: Secondary | ICD-10-CM | POA: Diagnosis not present

## 2020-11-09 DIAGNOSIS — L409 Psoriasis, unspecified: Secondary | ICD-10-CM | POA: Diagnosis not present

## 2020-11-09 DIAGNOSIS — R0681 Apnea, not elsewhere classified: Secondary | ICD-10-CM

## 2020-11-09 MED ORDER — ALPRAZOLAM 1 MG PO TABS: 1.0000 mg | ORAL_TABLET | Freq: Two times a day (BID) | ORAL | 2 refills | Status: DC | PRN

## 2020-11-09 NOTE — Progress Notes (Signed)
Subjective:    Patient ID: [name], male    DOB: [birth year], 80 y.o.   MRN: [id number]  Chief Complaint  Patient presents with  . Hypertension   Pt presents to the office today for chronic follow up. PT is followed by Cardiologists for CAD, CHF, Atherosclerosis, Cardiomyopathy, and Ventricular tachycardia every 2 years. PT has pacemaker and is followed annually for this.   Pt is followed by Dermatologists forpsoriasis as needed.   He does report drinking3-6beers a day.He reports constant burning and aching pain of bilateral feet. He reports Lyrica is not working.  Hypertension This is a chronic problem. The current episode started more than 1 year ago. The problem has been resolved since onset. The problem is controlled. Associated symptoms include anxiety and peripheral edema (right leg). Pertinent negatives include no malaise/fatigue or shortness of breath. Risk factors for coronary artery disease include dyslipidemia, male gender and sedentary lifestyle. The current treatment provides moderate improvement. Hypertensive end-organ damage includes kidney disease and heart failure.  Gastroesophageal Reflux He complains of belching and heartburn. This is a chronic problem. The current episode started more than 1 year ago. The problem occurs occasionally. The problem has been waxing and waning. He has tried a PPI for the symptoms. The treatment provided moderate relief.  Insomnia Primary symptoms: difficulty falling asleep, frequent awakening, no malaise/fatigue.  The current episode started more than one year. The onset quality is gradual. The problem occurs intermittently. The treatment provided moderate relief.  Hyperlipidemia This is a chronic problem. The current episode started more than 1 year ago. The problem is controlled. Pertinent negatives include no shortness of breath. Current antihyperlipidemic treatment includes statins. The current treatment provides moderate  improvement of lipids. Risk factors for coronary artery disease include dyslipidemia, male sex, hypertension and a sedentary lifestyle.  Anxiety Presents for follow-up visit. Symptoms include depressed mood, excessive worry, insomnia, irritability, nervous/anxious behavior and restlessness. Patient reports no shortness of breath. Symptoms occur most days. The severity of symptoms is moderate.        Review of Systems  Constitutional: Positive for irritability. Negative for malaise/fatigue.  Respiratory: Negative for shortness of breath.   Gastrointestinal: Positive for heartburn.  Psychiatric/Behavioral: The patient is nervous/anxious and has insomnia.   All other systems reviewed and are negative.      Objective:   Physical Exam Vitals reviewed.  Constitutional:      General: He is not in acute distress.    Appearance: He is well-developed.  HENT:     Head: Normocephalic.     Right Ear: Tympanic membrane normal.     Left Ear: Tympanic membrane normal.  Eyes:     General:        Right eye: No discharge.        Left eye: No discharge.     Pupils: Pupils are equal, round, and reactive to light.  Neck:     Thyroid: No thyromegaly.  Cardiovascular:     Rate and Rhythm: Normal rate and regular rhythm.     Heart sounds: Normal heart sounds. No murmur heard.   Pulmonary:     Effort: Pulmonary effort is normal. No respiratory distress.     Breath sounds: Normal breath sounds. No wheezing.  Abdominal:     General: Bowel sounds are normal. There is no distension.     Palpations: Abdomen is soft.     Tenderness: There is no abdominal tenderness.  Musculoskeletal:        General:  No tenderness. Normal range of motion.     Cervical back: Normal range of motion and neck supple.  Skin:    General: Skin is warm and dry.     Findings: No erythema or rash.  Neurological:     Mental Status: He is alert and oriented to person, place, and time.     Cranial Nerves: No cranial nerve  deficit.     Deep Tendon Reflexes: Reflexes are normal and symmetric.  Psychiatric:        Behavior: Behavior normal.        Thought Content: Thought content normal.        Judgment: Judgment normal.       BP 133/66   Pulse 65   Temp 97.6 F (36.4 C) (Temporal)   Ht 5\' 7"  (1.702 m)   Wt 137 lb 3.2 oz (62.2 kg)   BMI 21.49 kg/m      Assessment & Plan:  [name] comes in today with chief complaint of Hypertension   Diagnosis and orders addressed:  1. Alcoholic peripheral neuropathy ([place])  2. GAD (generalized anxiety disorder) - ALPRAZolam (XANAX) 1 MG tablet; Take 1 tablet (1 mg total) by mouth 2 (two) times daily as needed. Keep appt w/ Alyse Low for refills  Dispense: 60 tablet; Refill: 2  3. Controlled substance agreement signed - ALPRAZolam (XANAX) 1 MG tablet; Take 1 tablet (1 mg total) by mouth 2 (two) times daily as needed. Keep appt w/ Alyse Low for refills  Dispense: 60 tablet; Refill: 2  4. Benzodiazepine dependence (HCC) - ALPRAZolam (XANAX) 1 MG tablet; Take 1 tablet (1 mg total) by mouth 2 (two) times daily as needed. Keep appt w/ Alyse Low for refills  Dispense: 60 tablet; Refill: 2  5. Essential hypertension  6. Atherosclerosis of native coronary artery with angina pectoris, unspecified whether native or transplanted heart ([place])  7. GERD with apnea  8. Thoracic aorta atherosclerosis ([place])  9. Psoriasis  10. TOBACCO ABUSE  11. Hyperlipidemia, unspecified hyperlipidemia type   12. Insomnia, unspecified type   Labs reviewed Patient reviewed in [place] controlled database, no flags noted. Contract and drug screen are up to date.  Health Maintenance reviewed Diet and exercise encouraged  Follow up plan: 3 months   Evelina Dun, FNP

## 2020-11-09 NOTE — Patient Instructions (Signed)
Bradley and Daroff's neurology in clinical practice (8th ed., pp. 1853- 1929). Elsevier."> Goldman-Cecil medicine (26th ed., pp. 2489- 2501). Elsevier.">  Peripheral Neuropathy Peripheral neuropathy is a type of nerve damage. It affects nerves that carry signals between the spinal cord and the arms, legs, and the rest of the body (peripheral nerves). It does not affect nerves in the spinal cord or brain. In peripheral neuropathy, one nerve or a group of nerves may be damaged. Peripheral neuropathy is a broad category that includes many specific nerve disorders, like diabetic neuropathy, hereditary neuropathy, and carpal tunnel syndrome. What are the causes? This condition may be caused by:  Diabetes. This is the most common cause of peripheral neuropathy.  Nerve injury.  Pressure or stress on a nerve that lasts a long time.  Lack (deficiency) of B vitamins. This can result from alcoholism, poor diet, or a restricted diet.  Infections.  Autoimmune diseases, such as rheumatoid arthritis and systemic lupus erythematosus.  Nerve diseases that are passed from parent to child (inherited).  Some medicines, such as cancer medicines (chemotherapy).  Poisonous (toxic) substances, such as lead and mercury.  Too little blood flowing to the legs.  Kidney disease.  Thyroid disease. In some cases, the cause of this condition is not known. What are the signs or symptoms? Symptoms of this condition depend on which of your nerves is damaged. Common symptoms include:  Loss of feeling (numbness) in the feet, hands, or both.  Tingling in the feet, hands, or both.  Burning pain.  Very sensitive skin.  Weakness.  Not being able to move a part of the body (paralysis).  Muscle twitching.  Clumsiness or poor coordination.  Loss of balance.  Not being able to control your bladder.  Feeling dizzy.  Sexual problems. How is this diagnosed? Diagnosing and finding the cause of peripheral  neuropathy can be difficult. Your health care provider will take your medical history and do a physical exam. A neurological exam will also be done. This involves checking things that are affected by your brain, spinal cord, and nerves (nervous system). For example, your health care provider will check your reflexes, how you move, and what you can feel. You may have other tests, such as:  Blood tests.  Electromyogram (EMG) and nerve conduction tests. These tests check nerve function and how well the nerves are controlling the muscles.  Imaging tests, such as CT scans or MRI to rule out other causes of your symptoms.  Removing a small piece of nerve to be examined in a lab (nerve biopsy).  Removing and examining a small amount of the fluid that surrounds the brain and spinal cord (lumbar puncture). How is this treated? Treatment for this condition may involve:  Treating the underlying cause of the neuropathy, such as diabetes, kidney disease, or vitamin deficiencies.  Stopping medicines that can cause neuropathy, such as chemotherapy.  Medicine to help relieve pain. Medicines may include: ? Prescription or over-the-counter pain medicine. ? Antiseizure medicine. ? Antidepressants. ? Pain-relieving patches that are applied to painful areas of skin.  Surgery to relieve pressure on a nerve or to destroy a nerve that is causing pain.  Physical therapy to help improve movement and balance.  Devices to help you move around (assistive devices). Follow these instructions at home: Medicines  Take over-the-counter and prescription medicines only as told by your health care provider. Do not take any other medicines without first asking your health care provider.  Do not drive or use heavy   machinery while taking prescription pain medicine. Lifestyle  Do not use any products that contain nicotine or tobacco, such as cigarettes and e-cigarettes. Smoking keeps blood from reaching damaged nerves.  If you need help quitting, ask your health care provider.  Avoid or limit alcohol. Too much alcohol can cause a vitamin B deficiency, and vitamin B is needed for healthy nerves.  Eat a healthy diet. This includes: ? Eating foods that are high in fiber, such as fresh fruits and vegetables, whole grains, and beans. ? Limiting foods that are high in fat and processed sugars, such as fried or sweet foods.   General instructions  If you have diabetes, work closely with your health care provider to keep your blood sugar under control.  If you have numbness in your feet: ? Check every day for signs of injury or infection. Watch for redness, warmth, and swelling. ? Wear padded socks and comfortable shoes. These help protect your feet.  Develop a good support system. Living with peripheral neuropathy can be stressful. Consider talking with a mental health specialist or joining a support group.  Use assistive devices and attend physical therapy as told by your health care provider. This may include using a walker or a cane.  Keep all follow-up visits as told by your health care provider. This is important.   Contact a health care provider if:  You have new signs or symptoms of peripheral neuropathy.  You are struggling emotionally from dealing with peripheral neuropathy.  Your pain is not well-controlled. Get help right away if:  You have an injury or infection that is not healing normally.  You develop new weakness in an arm or leg.  You have fallen or do so frequently. Summary  Peripheral neuropathy is when the nerves in the arms, or legs are damaged, resulting in numbness, weakness, or pain.  There are many causes of peripheral neuropathy, including diabetes, pinched nerves, vitamin deficiencies, autoimmune disease, and hereditary conditions.  Diagnosing and finding the cause of peripheral neuropathy can be difficult. Your health care provider will take your medical history, do a  physical exam, and do tests, including blood tests and nerve function tests.  Treatment involves treating the underlying cause of the neuropathy and taking medicines to help control pain. Physical therapy and assistive devices may also help. This information is not intended to replace advice given to you by your health care provider. Make sure you discuss any questions you have with your health care provider. Document Revised: 03/17/2020 Document Reviewed: 03/17/2020 Elsevier Patient Education  2021 Elsevier Inc.  

## 2020-11-12 NOTE — Progress Notes (Signed)
Remote ICD transmission.   

## 2020-11-15 DIAGNOSIS — G4737 Central sleep apnea in conditions classified elsewhere: Secondary | ICD-10-CM | POA: Diagnosis not present

## 2020-11-20 ENCOUNTER — Other Ambulatory Visit: Payer: Self-pay | Admitting: Internal Medicine

## 2020-11-20 DIAGNOSIS — E785 Hyperlipidemia, unspecified: Secondary | ICD-10-CM

## 2020-11-23 DIAGNOSIS — R262 Difficulty in walking, not elsewhere classified: Secondary | ICD-10-CM | POA: Diagnosis not present

## 2020-11-23 DIAGNOSIS — Z79899 Other long term (current) drug therapy: Secondary | ICD-10-CM | POA: Diagnosis not present

## 2020-11-23 DIAGNOSIS — K551 Chronic vascular disorders of intestine: Secondary | ICD-10-CM | POA: Diagnosis not present

## 2020-11-23 DIAGNOSIS — I2510 Atherosclerotic heart disease of native coronary artery without angina pectoris: Secondary | ICD-10-CM | POA: Diagnosis not present

## 2020-11-23 DIAGNOSIS — I700 Atherosclerosis of aorta: Secondary | ICD-10-CM | POA: Diagnosis not present

## 2020-11-23 DIAGNOSIS — K5730 Diverticulosis of large intestine without perforation or abscess without bleeding: Secondary | ICD-10-CM | POA: Diagnosis not present

## 2020-11-23 DIAGNOSIS — Z720 Tobacco use: Secondary | ICD-10-CM | POA: Diagnosis not present

## 2020-11-23 DIAGNOSIS — J432 Centrilobular emphysema: Secondary | ICD-10-CM | POA: Diagnosis not present

## 2020-11-23 DIAGNOSIS — J929 Pleural plaque without asbestos: Secondary | ICD-10-CM | POA: Diagnosis not present

## 2020-11-23 DIAGNOSIS — J439 Emphysema, unspecified: Secondary | ICD-10-CM | POA: Diagnosis not present

## 2020-11-23 DIAGNOSIS — I739 Peripheral vascular disease, unspecified: Secondary | ICD-10-CM | POA: Diagnosis not present

## 2020-11-23 DIAGNOSIS — S301XXA Contusion of abdominal wall, initial encounter: Secondary | ICD-10-CM | POA: Diagnosis not present

## 2020-11-23 DIAGNOSIS — I701 Atherosclerosis of renal artery: Secondary | ICD-10-CM | POA: Diagnosis not present

## 2020-11-23 DIAGNOSIS — W19XXXA Unspecified fall, initial encounter: Secondary | ICD-10-CM | POA: Diagnosis not present

## 2020-11-23 DIAGNOSIS — S20222A Contusion of left back wall of thorax, initial encounter: Secondary | ICD-10-CM | POA: Diagnosis not present

## 2020-11-30 ENCOUNTER — Other Ambulatory Visit: Payer: Self-pay | Admitting: Internal Medicine

## 2020-11-30 ENCOUNTER — Other Ambulatory Visit: Payer: Self-pay | Admitting: Family

## 2020-11-30 DIAGNOSIS — I10 Essential (primary) hypertension: Secondary | ICD-10-CM

## 2020-12-15 DIAGNOSIS — R063 Periodic breathing: Secondary | ICD-10-CM | POA: Diagnosis not present

## 2020-12-15 DIAGNOSIS — G4737 Central sleep apnea in conditions classified elsewhere: Secondary | ICD-10-CM | POA: Diagnosis not present

## 2020-12-16 DIAGNOSIS — G4737 Central sleep apnea in conditions classified elsewhere: Secondary | ICD-10-CM | POA: Diagnosis not present

## 2020-12-17 ENCOUNTER — Encounter: Payer: Self-pay | Admitting: Family

## 2020-12-17 ENCOUNTER — Other Ambulatory Visit: Payer: Self-pay

## 2020-12-17 ENCOUNTER — Ambulatory Visit (INDEPENDENT_AMBULATORY_CARE_PROVIDER_SITE_OTHER): Payer: Medicare Other | Admitting: Family

## 2020-12-17 VITALS — BP 134/60 | HR 58 | Temp 96.3°F | Ht 67.0 in | Wt 133.0 lb

## 2020-12-17 DIAGNOSIS — Z09 Encounter for follow-up examination after completed treatment for conditions other than malignant neoplasm: Secondary | ICD-10-CM

## 2020-12-17 DIAGNOSIS — R350 Frequency of micturition: Secondary | ICD-10-CM | POA: Diagnosis not present

## 2020-12-17 DIAGNOSIS — I700 Atherosclerosis of aorta: Secondary | ICD-10-CM | POA: Diagnosis not present

## 2020-12-17 DIAGNOSIS — J432 Centrilobular emphysema: Secondary | ICD-10-CM

## 2020-12-17 DIAGNOSIS — R3914 Feeling of incomplete bladder emptying: Secondary | ICD-10-CM | POA: Diagnosis not present

## 2020-12-17 DIAGNOSIS — N400 Enlarged prostate without lower urinary tract symptoms: Secondary | ICD-10-CM | POA: Diagnosis not present

## 2020-12-17 DIAGNOSIS — N401 Enlarged prostate with lower urinary tract symptoms: Secondary | ICD-10-CM

## 2020-12-17 LAB — URINALYSIS, COMPLETE
Bilirubin, UA: NEGATIVE
Glucose, UA: NEGATIVE
Ketones, UA: NEGATIVE
Leukocytes,UA: NEGATIVE
Nitrite, UA: NEGATIVE
Protein,UA: NEGATIVE
RBC, UA: NEGATIVE
Specific Gravity, UA: 1.02 (ref 1.005–1.030)
Urobilinogen, Ur: 0.2 mg/dL (ref 0.2–1.0)
pH, UA: 7 (ref 5.0–7.5)

## 2020-12-17 LAB — MICROSCOPIC EXAMINATION
Bacteria, UA: NONE SEEN
Epithelial Cells (non renal): NONE SEEN /hpf (ref 0–10)
RBC, Urine: NONE SEEN /hpf (ref 0–2)

## 2020-12-17 MED ORDER — TAMSULOSIN HCL 0.4 MG PO CAPS
0.4000 mg | ORAL_CAPSULE | Freq: Every day | ORAL | 3 refills | Status: DC
Start: 2020-12-17 — End: 2021-02-09

## 2020-12-17 NOTE — Patient Instructions (Signed)
Benign Prostatic Hyperplasia  Benign prostatic hyperplasia (BPH) is an enlarged prostate gland that is caused by the normal aging process and not by cancer. The prostate is a walnut-sized gland that is involved in the production of semen. It is located in front of the rectum and below the bladder. The bladder stores urine and the urethra is the tube that carries the urine out of the body. The prostate may get bigger asa man gets older. An enlarged prostate can press on the urethra. This can make it harder to pass urine. The build-up of urine in the bladder can cause infection. Back pressure and infection may progress to bladder damage and kidney (renal) failure. What are the causes? This condition is part of a normal aging process. However, not all men develop problems from this condition. If the prostate enlarges away from the urethra, urine flow will not be blocked. If it enlarges toward the urethra andcompresses it, there will be problems passing urine. What increases the risk? This condition is more likely to develop in men over the age of 50 years. What are the signs or symptoms? Symptoms of this condition include: Getting up often during the night to urinate. Needing to urinate frequently during the day. Difficulty starting urine flow. Decrease in size and strength of your urine stream. Leaking (dribbling) after urinating. Inability to pass urine. This needs immediate treatment. Inability to completely empty your bladder. Pain when you pass urine. This is more common if there is also an infection. Urinary tract infection (UTI). How is this diagnosed? This condition is diagnosed based on your medical history, a physical exam, and your symptoms. Tests will also be done, such as: A post-void bladder scan. This measures any amount of urine that may remain in your bladder after you finish urinating. A digital rectal exam. In a rectal exam, your health care provider checks your prostate by  putting a lubricated, gloved finger into your rectum to feel the back of your prostate gland. This exam detects the size of your gland and any abnormal lumps or growths. An exam of your urine (urinalysis). A prostate specific antigen (PSA) screening. This is a blood test used to screen for prostate cancer. An ultrasound. This test uses sound waves to electronically produce a picture of your prostate gland. Your health care provider may refer you to a specialist in kidney and prostate diseases (urologist). How is this treated? Once symptoms begin, your health care provider will monitor your condition (active surveillance or watchful waiting). Treatment for this condition will depend on the severity of your condition. Treatment may include: Observation and yearly exams. This may be the only treatment needed if your condition and symptoms are mild. Medicines to relieve your symptoms, including: Medicines to shrink the prostate. Medicines to relax the muscle of the prostate. Surgery in severe cases. Surgery may include: Prostatectomy. In this procedure, the prostate tissue is removed completely through an open incision or with a laparoscope or robotics. Transurethral resection of the prostate (TURP). In this procedure, a tool is inserted through the opening at the tip of the penis (urethra). It is used to cut away tissue of the inner core of the prostate. The pieces are removed through the same opening of the penis. This removes the blockage. Transurethral incision (TUIP). In this procedure, small cuts are made in the prostate. This lessens the prostate's pressure on the urethra. Transurethral microwave thermotherapy (TUMT). This procedure uses microwaves to create heat. The heat destroys and removes a small   amount of prostate tissue. Transurethral needle ablation (TUNA). This procedure uses radio frequencies to destroy and remove a small amount of prostate tissue. Interstitial laser coagulation (ILC).  This procedure uses a laser to destroy and remove a small amount of prostate tissue. Transurethral electrovaporization (TUVP). This procedure uses electrodes to destroy and remove a small amount of prostate tissue. Prostatic urethral lift. This procedure inserts an implant to push the lobes of the prostate away from the urethra. Follow these instructions at home: Take over-the-counter and prescription medicines only as told by your health care provider. Monitor your symptoms for any changes. Contact your health care provider with any changes. Avoid drinking large amounts of liquid before going to bed or out in public. Avoid or reduce how much caffeine or alcohol you drink. Give yourself time when you urinate. Keep all follow-up visits as told by your health care provider. This is important. Contact a health care provider if: You have unexplained back pain. Your symptoms do not get better with treatment. You develop side effects from the medicine you are taking. Your urine becomes very dark or has a bad smell. Your lower abdomen becomes distended and you have trouble passing your urine. Get help right away if: You have a fever or chills. You suddenly cannot urinate. You feel lightheaded, or very dizzy, or you faint. There are large amounts of blood or clots in the urine. Your urinary problems become hard to manage. You develop moderate to severe low back or flank pain. The flank is the side of your body between the ribs and the hip. These symptoms may represent a serious problem that is an emergency. Do not wait to see if the symptoms will go away. Get medical help right away. Call your local emergency services (911 in the [place] Do not drive yourself to the hospital. Summary Benign prostatic hyperplasia (BPH) is an enlarged prostate that is caused by the normal aging process and not by cancer. An enlarged prostate can press on the urethra. This can make it hard to pass urine. This  condition is part of a normal aging process and is more likely to develop in men over the age of 50 years. Get help right away if you suddenly cannot urinate. This information is not intended to replace advice given to you by your health care provider. Make sure you discuss any questions you have with your healthcare provider. Document Revised: 02/13/2020 Document Reviewed: 02/13/2020 Elsevier Patient Education  2022 [employer]  

## 2020-12-17 NOTE — Progress Notes (Signed)
Subjective:    Patient ID: [name], male    DOB: 05/29/1941, 80 y.o.   MRN: [id number]  Chief Complaint  Patient presents with   Hospitalization Follow-up    PSA was noted on scan had a fall    Urinary Frequency   Pt presents to the office today for hospital follow up. He went to the ED on 11/23/20 after a fall and left flank pain.   He had a CT of abdomen that showed, "No acute intrathoracic or intra-abdominal injury. The visualized  axial skeleton is intact. Mild subcutaneous edema involving the left  lateral chest wall compatible with the given history of local  trauma.   Extensive multi-vessel coronary artery calcification.   Mild No acute intrathoracic or intra-abdominal injury. The visualized  axial skeleton is intact. Mild subcutaneous edema involving the left  lateral chest wall compatible with the given history of local  trauma.   Extensive multi-vessel coronary artery calcification.   Mild centrilobular emphysema.   Mild distal colonic diverticulosis.   Marked prostatic enlargement, slightly progressive since remote  prior examination. The bladder is distended.   Peripheral vascular disease with particularly prominent  atherosclerotic calcification of the origin of the mesenteric and  right renal arteries as well as the mid left common iliac artery..  If there is clinical evidence of lower extremity claudication,  chronic mesenteric ischemia or clinically significant renal artery  stenosis, CT arteriography may be more helpful for further  evaluation.   Aortic Atherosclerosis (ICD10-I70.0) and Emphysema (ICD10-J43.9).    Mild distal colonic diverticulosis.   Marked prostatic enlargement, slightly progressive since remote  prior examination. The bladder is distended.   Peripheral vascular disease with particularly prominent  atherosclerotic calcification of the origin of the mesenteric and  right renal arteries as well as the mid left common  iliac artery..  If there is clinical evidence of lower extremity claudication,  chronic mesenteric ischemia or clinically significant renal artery  stenosis, CT arteriography may be more helpful for further  evaluation.   Aortic Atherosclerosis (ICD10-I70.0) and Emphysema (ICD10-J43.9). " Urinary Frequency  This is a new problem. The current episode started more than 1 year ago. The problem occurs intermittently. The pain is at a severity of 0/10. The patient is experiencing no pain. There has been no fever. Associated symptoms include frequency, hesitancy and urgency. Pertinent negatives include no flank pain or hematuria. He has tried increased fluids for the symptoms. The treatment provided mild relief.  Benign Prostatic Hypertrophy Irritative symptoms include frequency, nocturia (2-5) and urgency. Obstructive symptoms include an intermittent stream, straining and a weak stream. Associated symptoms include hesitancy. Pertinent negatives include no hematuria.     Review of Systems  Genitourinary:  Positive for frequency, hesitancy, nocturia (2-5) and urgency. Negative for flank pain and hematuria.      Objective:   Physical Exam Vitals reviewed.  Constitutional:      General: He is not in acute distress.    Appearance: He is well-developed.  HENT:     Head: Normocephalic.     Right Ear: Tympanic membrane normal.     Left Ear: Tympanic membrane normal.  Eyes:     General:        Right eye: No discharge.        Left eye: No discharge.     Pupils: Pupils are equal, round, and reactive to light.  Neck:     Thyroid: No thyromegaly.  Cardiovascular:     Rate and  Rhythm: Normal rate and regular rhythm.     Heart sounds: Murmur heard.  Pulmonary:     Effort: Pulmonary effort is normal. No respiratory distress.     Breath sounds: Normal breath sounds. No wheezing.  Abdominal:     General: Bowel sounds are normal. There is no distension.     Palpations: Abdomen is soft.      Tenderness: There is no abdominal tenderness.  Musculoskeletal:        General: No tenderness. Normal range of motion.     Cervical back: Normal range of motion and neck supple.  Skin:    General: Skin is warm and dry.     Findings: No erythema or rash.  Neurological:     Mental Status: He is alert and oriented to person, place, and time.     Cranial Nerves: No cranial nerve deficit.     Deep Tendon Reflexes: Reflexes are normal and symmetric.  Psychiatric:        Behavior: Behavior normal.        Thought Content: Thought content normal.        Judgment: Judgment normal.      BP 134/60   Pulse (!) 58   Temp (!) 96.3 F (35.7 C) (Temporal)   Ht 5\' 7"  (1.702 m)   Wt 133 lb (60.3 kg)   BMI 20.83 kg/m      Assessment & Plan:  [name] comes in today with chief complaint of Hospitalization Follow-up (PSA was noted on scan had a fall ) and Urinary Frequency   Diagnosis and orders addressed:  1. Urine frequency - Urinalysis, Complete - Ambulatory referral to Urology  2. Aortic atherosclerosis ([name])  3. Centrilobular emphysema ([name])  4. Enlarged prostate - Ambulatory referral to Urology  5. Hospital discharge follow-up  6. Benign prostatic hyperplasia with incomplete bladder emptying Start Flomax  - Ambulatory referral to Urology - tamsulosin (FLOMAX) 0.4 MG CAPS capsule; Take 1 capsule (0.4 mg total) by mouth daily.  Dispense: 30 capsule; Refill: 3    Health Maintenance reviewed Diet and exercise encouraged  Follow up plan: Keep chronic follow up   Evelina Dun, FNP

## 2021-01-25 ENCOUNTER — Other Ambulatory Visit: Payer: Self-pay | Admitting: Family

## 2021-01-25 ENCOUNTER — Ambulatory Visit (INDEPENDENT_AMBULATORY_CARE_PROVIDER_SITE_OTHER): Payer: Medicare Other

## 2021-01-25 DIAGNOSIS — I428 Other cardiomyopathies: Secondary | ICD-10-CM | POA: Diagnosis not present

## 2021-01-25 DIAGNOSIS — K219 Gastro-esophageal reflux disease without esophagitis: Secondary | ICD-10-CM

## 2021-01-27 LAB — CUP PACEART REMOTE DEVICE CHECK
Battery Remaining Longevity: 36 mo
Battery Remaining Percentage: 58 %
Brady Statistic RA Percent Paced: 0 %
Brady Statistic RV Percent Paced: 100 %
Date Time Interrogation Session: 20220808003100
HighPow Impedance: 61 Ohm
Implantable Lead Implant Date: 20040312
Implantable Lead Implant Date: 20040312
Implantable Lead Implant Date: 20040312
Implantable Lead Location: 753858
Implantable Lead Location: 753859
Implantable Lead Location: 753860
Implantable Lead Model: 158
Implantable Lead Model: 4087
Implantable Lead Model: 4513
Implantable Lead Serial Number: 129158
Implantable Lead Serial Number: 209446
Implantable Lead Serial Number: 407264
Implantable Pulse Generator Implant Date: 20150917
Lead Channel Impedance Value: 1037 Ohm
Lead Channel Impedance Value: 569 Ohm
Lead Channel Impedance Value: 588 Ohm
Lead Channel Pacing Threshold Amplitude: 0.7 V
Lead Channel Pacing Threshold Amplitude: 1.2 V
Lead Channel Pacing Threshold Amplitude: 2.2 V
Lead Channel Pacing Threshold Pulse Width: 0.4 ms
Lead Channel Pacing Threshold Pulse Width: 0.4 ms
Lead Channel Pacing Threshold Pulse Width: 0.8 ms
Lead Channel Setting Pacing Amplitude: 2 V
Lead Channel Setting Pacing Amplitude: 2.5 V
Lead Channel Setting Pacing Amplitude: 3 V
Lead Channel Setting Pacing Pulse Width: 0.4 ms
Lead Channel Setting Pacing Pulse Width: 0.8 ms
Lead Channel Setting Sensing Sensitivity: 0.6 mV
Lead Channel Setting Sensing Sensitivity: 1 mV
Pulse Gen Serial Number: 106298

## 2021-02-09 ENCOUNTER — Ambulatory Visit (INDEPENDENT_AMBULATORY_CARE_PROVIDER_SITE_OTHER): Payer: Medicare Other | Admitting: Family

## 2021-02-09 ENCOUNTER — Other Ambulatory Visit: Payer: Self-pay

## 2021-02-09 ENCOUNTER — Encounter: Payer: Self-pay | Admitting: Family

## 2021-02-09 VITALS — BP 146/76 | HR 57 | Temp 97.1°F | Ht 67.0 in | Wt 129.2 lb

## 2021-02-09 DIAGNOSIS — I5022 Chronic systolic (congestive) heart failure: Secondary | ICD-10-CM

## 2021-02-09 DIAGNOSIS — G621 Alcoholic polyneuropathy: Secondary | ICD-10-CM

## 2021-02-09 DIAGNOSIS — F411 Generalized anxiety disorder: Secondary | ICD-10-CM | POA: Diagnosis not present

## 2021-02-09 DIAGNOSIS — F17200 Nicotine dependence, unspecified, uncomplicated: Secondary | ICD-10-CM

## 2021-02-09 DIAGNOSIS — I10 Essential (primary) hypertension: Secondary | ICD-10-CM | POA: Diagnosis not present

## 2021-02-09 DIAGNOSIS — G4700 Insomnia, unspecified: Secondary | ICD-10-CM

## 2021-02-09 DIAGNOSIS — E785 Hyperlipidemia, unspecified: Secondary | ICD-10-CM | POA: Diagnosis not present

## 2021-02-09 DIAGNOSIS — N401 Enlarged prostate with lower urinary tract symptoms: Secondary | ICD-10-CM | POA: Diagnosis not present

## 2021-02-09 DIAGNOSIS — Z1211 Encounter for screening for malignant neoplasm of colon: Secondary | ICD-10-CM

## 2021-02-09 DIAGNOSIS — Z79899 Other long term (current) drug therapy: Secondary | ICD-10-CM

## 2021-02-09 DIAGNOSIS — K219 Gastro-esophageal reflux disease without esophagitis: Secondary | ICD-10-CM

## 2021-02-09 DIAGNOSIS — I700 Atherosclerosis of aorta: Secondary | ICD-10-CM | POA: Diagnosis not present

## 2021-02-09 DIAGNOSIS — F1320 Sedative, hypnotic or anxiolytic dependence, uncomplicated: Secondary | ICD-10-CM

## 2021-02-09 DIAGNOSIS — R0681 Apnea, not elsewhere classified: Secondary | ICD-10-CM

## 2021-02-09 DIAGNOSIS — R3914 Feeling of incomplete bladder emptying: Secondary | ICD-10-CM

## 2021-02-09 DIAGNOSIS — F1010 Alcohol abuse, uncomplicated: Secondary | ICD-10-CM

## 2021-02-09 MED ORDER — ALPRAZOLAM 1 MG PO TABS: 1.0000 mg | ORAL_TABLET | Freq: Two times a day (BID) | ORAL | 2 refills | Status: DC | PRN

## 2021-02-09 MED ORDER — TAMSULOSIN HCL 0.4 MG PO CAPS: 0.4000 mg | ORAL_CAPSULE | Freq: Every day | ORAL | 3 refills | Status: DC

## 2021-02-09 NOTE — Progress Notes (Signed)
Subjective:    Patient ID: [name], male    DOB: [birth year], 80 y.o.   MRN: [id number]  Chief Complaint  Patient presents with   Hypertension    3 month follow up    Pt presents to the office today for chronic follow up. PT is followed by Cardiologists for CAD, CHF, Atherosclerosis, Cardiomyopathy, and Ventricular tachycardia every 2 years.  PT has pacemaker and is followed annually for this.     Pt is followed by Dermatologists for psoriasis as needed.     He does report drinking 4-6 beers a day. He reports constant burning and aching pain of bilateral feet. He reports Lyrica is not working.  Hypertension This is a chronic problem. The current episode started more than 1 year ago. The problem has been waxing and waning since onset. The problem is controlled. Pertinent negatives include no malaise/fatigue, peripheral edema or shortness of breath. Risk factors for coronary artery disease include dyslipidemia, male gender and sedentary lifestyle. The current treatment provides moderate improvement.  Gastroesophageal Reflux He complains of belching and heartburn. This is a chronic problem. The current episode started more than 1 year ago. The problem occurs occasionally. He has tried a PPI for the symptoms. The treatment provided moderate relief.   OSA Uses CPAP nightly.   Alcoholic neuropathy He reports bilateral burning tinging of 6 out 10.   Review of Systems  Constitutional:  Negative for malaise/fatigue.  Respiratory:  Negative for shortness of breath.   Gastrointestinal:  Positive for heartburn.  All other systems reviewed and are negative.     Objective:   Physical Exam Vitals reviewed.  Constitutional:      General: He is not in acute distress.    Appearance: He is well-developed.  HENT:     Head: Normocephalic.  Eyes:     General:        Right eye: No discharge.        Left eye: No discharge.     Pupils: Pupils are equal, round, and reactive to light.   Neck:     Thyroid: No thyromegaly.  Cardiovascular:     Rate and Rhythm: Normal rate and regular rhythm.     Heart sounds: Normal heart sounds. No murmur heard. Pulmonary:     Effort: Pulmonary effort is normal. No respiratory distress.     Breath sounds: Normal breath sounds. No wheezing.  Abdominal:     General: Bowel sounds are normal. There is no distension.     Palpations: Abdomen is soft.     Tenderness: There is no abdominal tenderness.  Musculoskeletal:        General: No tenderness. Normal range of motion.     Cervical back: Normal range of motion and neck supple.  Skin:    General: Skin is warm and dry.     Findings: No erythema or rash.  Neurological:     Mental Status: He is alert and oriented to person, place, and time.     Cranial Nerves: No cranial nerve deficit.     Deep Tendon Reflexes: Reflexes are normal and symmetric.  Psychiatric:        Behavior: Behavior normal.        Thought Content: Thought content normal.        Judgment: Judgment normal.      BP (!) 146/76   Pulse (!) 57   Temp (!) 97.1 F (36.2 C) (Temporal)   Ht '5\' 7"'  (1.702 m)  Wt 129 lb 3.2 oz (58.6 kg)   SpO2 96%   BMI 20.24 kg/m      Assessment & Plan:  TRAVUS OREN comes in today with chief complaint of Hypertension (3 month follow up )   Diagnosis and orders addressed:  1. Benign prostatic hyperplasia with incomplete bladder emptying - tamsulosin (FLOMAX) 0.4 MG CAPS capsule; Take 1 capsule (0.4 mg total) by mouth daily.  Dispense: 30 capsule; Refill: 3 - CMP14+EGFR  2. GAD (generalized anxiety disorder) - ALPRAZolam (XANAX) 1 MG tablet; Take 1 tablet (1 mg total) by mouth 2 (two) times daily as needed. Keep appt w/ Alyse Low for refills  Dispense: 60 tablet; Refill: 2 - CMP14+EGFR  3. Controlled substance agreement signed - ALPRAZolam (XANAX) 1 MG tablet; Take 1 tablet (1 mg total) by mouth 2 (two) times daily as needed. Keep appt w/ Alyse Low for refills  Dispense: 60  tablet; Refill: 2 - CMP14+EGFR  4. Benzodiazepine dependence (HCC) - ALPRAZolam (XANAX) 1 MG tablet; Take 1 tablet (1 mg total) by mouth 2 (two) times daily as needed. Keep appt w/ Alyse Low for refills  Dispense: 60 tablet; Refill: 2 - CMP14+EGFR  5. Thoracic aorta atherosclerosis (HCC) - CMP14+EGFR  6. Aortic atherosclerosis (HCC) - CMP14+EGFR  7. Chronic systolic heart failure (HCC) - CMP14+EGFR  8. Essential hypertension - CMP14+EGFR  9. GERD with apnea - CMP14+EGFR  10. Alcoholic peripheral neuropathy (HCC) - CMP14+EGFR  11. Gastroesophageal reflux disease, unspecified whether esophagitis present - CMP14+EGFR  12. Hyperlipidemia, unspecified hyperlipidemia type  - CMP14+EGFR  13. TOBACCO ABUSE - CMP14+EGFR  14. Alcohol abuse - CMP14+EGFR  15. Insomnia, unspecified type  - CMP14+EGFR  16. Colon cancer screening - Ambulatory referral to Gastroenterology - CMP14+EGFR   Labs pending Patient reviewed in [place] controlled database, no flags noted. Contract and drug screen are up to date.  Health Maintenance reviewed Diet and exercise encouraged  Follow up plan: 3 months    Evelina Dun, FNP

## 2021-02-09 NOTE — Patient Instructions (Signed)

## 2021-02-10 LAB — CMP14+EGFR
ALT: 21 IU/L (ref 0–44)
AST: 35 IU/L (ref 0–40)
Albumin/Globulin Ratio: 1.4 (ref 1.2–2.2)
Albumin: 4.2 g/dL (ref 3.7–4.7)
Alkaline Phosphatase: 86 IU/L (ref 44–121)
BUN/Creatinine Ratio: 10 (ref 10–24)
BUN: 12 mg/dL (ref 8–27)
Bilirubin Total: 0.4 mg/dL (ref 0.0–1.2)
CO2: 20 mmol/L (ref 20–29)
Calcium: 9.8 mg/dL (ref 8.6–10.2)
Chloride: 90 mmol/L — ABNORMAL LOW (ref 96–106)
Creatinine, Ser: 1.17 mg/dL (ref 0.76–1.27)
Globulin, Total: 3.1 g/dL (ref 1.5–4.5)
Glucose: 94 mg/dL (ref 65–99)
Potassium: 5.2 mmol/L (ref 3.5–5.2)
Sodium: 126 mmol/L — ABNORMAL LOW (ref 134–144)
Total Protein: 7.3 g/dL (ref 6.0–8.5)
eGFR: 64 mL/min/{1.73_m2} (ref >59)

## 2021-02-16 ENCOUNTER — Ambulatory Visit: Payer: Medicare Other | Admitting: Urology

## 2021-02-18 NOTE — Progress Notes (Signed)
Remote ICD transmission.   

## 2021-03-06 ENCOUNTER — Other Ambulatory Visit: Payer: Self-pay | Admitting: Cardiology

## 2021-03-06 DIAGNOSIS — E785 Hyperlipidemia, unspecified: Secondary | ICD-10-CM

## 2021-03-16 DIAGNOSIS — R063 Periodic breathing: Secondary | ICD-10-CM | POA: Diagnosis not present

## 2021-03-16 DIAGNOSIS — G4737 Central sleep apnea in conditions classified elsewhere: Secondary | ICD-10-CM | POA: Diagnosis not present

## 2021-04-21 ENCOUNTER — Ambulatory Visit (INDEPENDENT_AMBULATORY_CARE_PROVIDER_SITE_OTHER): Payer: Medicare Other

## 2021-04-21 VITALS — Ht 67.0 in | Wt 129.0 lb

## 2021-04-21 DIAGNOSIS — Z1212 Encounter for screening for malignant neoplasm of rectum: Secondary | ICD-10-CM

## 2021-04-21 DIAGNOSIS — Z1211 Encounter for screening for malignant neoplasm of colon: Secondary | ICD-10-CM

## 2021-04-21 DIAGNOSIS — Z0000 Encounter for general adult medical examination without abnormal findings: Secondary | ICD-10-CM

## 2021-04-21 NOTE — Patient Instructions (Signed)
Mr. Mckeithan , Thank you for taking time to come for your Medicare Wellness Visit. I appreciate your ongoing commitment to your health goals. Please review the following plan we discussed and let me know if I can assist you in the future.   Screening recommendations/referrals: Colonoscopy: Cologuard ordered 04/21/2021, last colonoscopy 01/10/2012. Recommended yearly ophthalmology/optometry visit for glaucoma screening and checkup Recommended yearly dental visit for hygiene and checkup  Vaccinations: Influenza vaccine: Due. Repeat in 10 years  Pneumococcal vaccine: Done 09/24/2015 and 03/28/2017 Tdap vaccine: Deon 10/21/2017 Repeat in 10 years  Shingles vaccine: Shingrix discussed. Please contact your pharmacy for coverage information.     Covid-19: Done 07/25/19, 08/23/19 and 06/04/2020  Advanced directives: Advance directive discussed with you today. Even though you declined this today, please call our office should you change your mind, and we can give you the proper paperwork for you to fill out.   Conditions/risks identified: Aim for 30 minutes of exercise or walking each day, drink 6-8 glasses of water and eat lots of fruits and vegetables.   Next appointment: Follow up in one year for your annual wellness visit. 2023.  Preventive Care 17 Years and Older, Male  Preventive care refers to lifestyle choices and visits with your health care provider that can promote health and wellness. What does preventive care include? A yearly physical exam. This is also called an annual well check. Dental exams once or twice a year. Routine eye exams. Ask your health care provider how often you should have your eyes checked. Personal lifestyle choices, including: Daily care of your teeth and gums. Regular physical activity. Eating a healthy diet. Avoiding tobacco and drug use. Limiting alcohol use. Practicing safe sex. Taking low doses of aspirin every day. Taking vitamin and mineral supplements as  recommended by your health care provider. What happens during an annual well check? The services and screenings done by your health care provider during your annual well check will depend on your age, overall health, lifestyle risk factors, and family history of disease. Counseling  Your health care provider may ask you questions about your: Alcohol use. Tobacco use. Drug use. Emotional well-being. Home and relationship well-being. Sexual activity. Eating habits. History of falls. Memory and ability to understand (cognition). Work and work Statistician. Screening  You may have the following tests or measurements: Height, weight, and BMI. Blood pressure. Lipid and cholesterol levels. These may be checked every 5 years, or more frequently if you are over 86 years old. Skin check. Lung cancer screening. You may have this screening every year starting at age 71 if you have a 30-pack-year history of smoking and currently smoke or have quit within the past 15 years. Fecal occult blood test (FOBT) of the stool. You may have this test every year starting at age 27. Flexible sigmoidoscopy or colonoscopy. You may have a sigmoidoscopy every 5 years or a colonoscopy every 10 years starting at age 48. Prostate cancer screening. Recommendations will vary depending on your family history and other risks. Hepatitis C blood test. Hepatitis B blood test. Sexually transmitted disease (STD) testing. Diabetes screening. This is done by checking your blood sugar (glucose) after you have not eaten for a while (fasting). You may have this done every 1-3 years. Abdominal aortic aneurysm (AAA) screening. You may need this if you are a current or former smoker. Osteoporosis. You may be screened starting at age 66 if you are at high risk. Talk with your health care provider about your test results,  treatment options, and if necessary, the need for more tests. Vaccines  Your health care provider may recommend  certain vaccines, such as: Influenza vaccine. This is recommended every year. Tetanus, diphtheria, and acellular pertussis (Tdap, Td) vaccine. You may need a Td booster every 10 years. Zoster vaccine. You may need this after age 49. Pneumococcal 13-valent conjugate (PCV13) vaccine. One dose is recommended after age 54. Pneumococcal polysaccharide (PPSV23) vaccine. One dose is recommended after age 14. Talk to your health care provider about which screenings and vaccines you need and how often you need them. This information is not intended to replace advice given to you by your health care provider. Make sure you discuss any questions you have with your health care provider. Document Released: 07/03/2015 Document Revised: 02/24/2016 Document Reviewed: 04/07/2015 Elsevier Interactive Patient Education  2017 [place] Prevention in the Home Falls can cause injuries. They can happen to people of all ages. There are many things you can do to make your home safe and to help prevent falls. What can I do on the outside of my home? Regularly fix the edges of walkways and driveways and fix any cracks. Remove anything that might make you trip as you walk through a door, such as a raised step or threshold. Trim any bushes or trees on the path to your home. Use bright outdoor lighting. Clear any walking paths of anything that might make someone trip, such as rocks or tools. Regularly check to see if handrails are loose or broken. Make sure that both sides of any steps have handrails. Any raised decks and porches should have guardrails on the edges. Have any leaves, snow, or ice cleared regularly. Use sand or salt on walking paths during winter. Clean up any spills in your garage right away. This includes oil or grease spills. What can I do in the bathroom? Use night lights. Install grab bars by the toilet and in the tub and shower. Do not use towel bars as grab bars. Use non-skid mats or  decals in the tub or shower. If you need to sit down in the shower, use a plastic, non-slip stool. Keep the floor dry. Clean up any water that spills on the floor as soon as it happens. Remove soap buildup in the tub or shower regularly. Attach bath mats securely with double-sided non-slip rug tape. Do not have throw rugs and other things on the floor that can make you trip. What can I do in the bedroom? Use night lights. Make sure that you have a light by your bed that is easy to reach. Do not use any sheets or blankets that are too big for your bed. They should not hang down onto the floor. Have a firm chair that has side arms. You can use this for support while you get dressed. Do not have throw rugs and other things on the floor that can make you trip. What can I do in the kitchen? Clean up any spills right away. Avoid walking on wet floors. Keep items that you use a lot in easy-to-reach places. If you need to reach something above you, use a strong step stool that has a grab bar. Keep electrical cords out of the way. Do not use floor polish or wax that makes floors slippery. If you must use wax, use non-skid floor wax. Do not have throw rugs and other things on the floor that can make you trip. What can I do with my stairs? Do  not leave any items on the stairs. Make sure that there are handrails on both sides of the stairs and use them. Fix handrails that are broken or loose. Make sure that handrails are as long as the stairways. Check any carpeting to make sure that it is firmly attached to the stairs. Fix any carpet that is loose or worn. Avoid having throw rugs at the top or bottom of the stairs. If you do have throw rugs, attach them to the floor with carpet tape. Make sure that you have a light switch at the top of the stairs and the bottom of the stairs. If you do not have them, ask someone to add them for you. What else can I do to help prevent falls? Wear shoes that: Do not  have high heels. Have rubber bottoms. Are comfortable and fit you well. Are closed at the toe. Do not wear sandals. If you use a stepladder: Make sure that it is fully opened. Do not climb a closed stepladder. Make sure that both sides of the stepladder are locked into place. Ask someone to hold it for you, if possible. Clearly mark and make sure that you can see: Any grab bars or handrails. First and last steps. Where the edge of each step is. Use tools that help you move around (mobility aids) if they are needed. These include: Canes. Walkers. Scooters. Crutches. Turn on the lights when you go into a dark area. Replace any light bulbs as soon as they burn out. Set up your furniture so you have a clear path. Avoid moving your furniture around. If any of your floors are uneven, fix them. If there are any pets around you, be aware of where they are. Review your medicines with your doctor. Some medicines can make you feel dizzy. This can increase your chance of falling. Ask your doctor what other things that you can do to help prevent falls. This information is not intended to replace advice given to you by your health care provider. Make sure you discuss any questions you have with your health care provider. Document Released: 04/02/2009 Document Revised: 11/12/2015 Document Reviewed: 07/11/2014 Elsevier Interactive Patient Education  2017 [employer].

## 2021-04-21 NOTE — Progress Notes (Signed)
Subjective:   [name] is a 80 y.o. male who presents for Medicare Annual/Subsequent preventive examination. Virtual Visit via Telephone Note  I connected with  [name] on 04/21/21 at  4:15 PM EDT by telephone and verified that I am speaking with the correct person using two identifiers.  Location: Patient: Home Provider: [facility] Persons participating in the virtual visit: patient/Nurse Health Advisor   I discussed the limitations, risks, security and privacy concerns of performing an evaluation and management service by telephone and the availability of in person appointments. The patient expressed understanding and agreed to proceed.  Interactive audio and video telecommunications were attempted between this nurse and patient, however failed, due to patient having technical difficulties OR patient did not have access to video capability.  We continued and completed visit with audio only.  Some vital signs may be absent or patient reported.   Chriss Driver, LPN  Review of Systems     Cardiac Risk Factors include: advanced age (>11men, >38 women);hypertension;male gender;sedentary lifestyle;dyslipidemia     Objective:    Today's Vitals   04/21/21 1605  Weight: 129 lb (58.5 kg)  Height: 5\' 7"  (1.702 m)   Body mass index is 20.2 kg/m.  Advanced Directives 04/21/2021 04/18/2019 04/26/2018 01/14/2016 12/10/2015 12/08/2015 03/06/2014  Does Patient Have a Medical Advance Directive? No Yes No No No No No  Type of Advance Directive - Healthcare Power of [place];Living will - - - - -  Does patient want to make changes to medical advance directive? - No - Patient declined - - - - -  Copy of [place] in Chart? - No - copy requested - - - - -  Would patient like information on creating a medical advance directive? No - Patient declined - - Yes - Scientist, clinical (histocompatibility and immunogenetics) given No - patient declined information No - patient declined information Yes Geologist, engineering given  Some encounter information is confidential and restricted. Go to Review Flowsheets activity to see all data.    Current Medications (verified) Outpatient Encounter Medications as of 04/21/2021  Medication Sig   albuterol (VENTOLIN HFA) 108 (90 Base) MCG/ACT inhaler Inhale 1 puff into the lungs every 6 (six) hours as needed for shortness of breath.   ALPRAZolam (XANAX) 1 MG tablet Take 1 tablet (1 mg total) by mouth 2 (two) times daily as needed. Keep appt w/ Alyse Low for refills   amLODipine (NORVASC) 10 MG tablet Take 1 tablet by mouth once daily   aspirin 81 MG tablet Take 81 mg by mouth daily.   cetirizine (ZYRTEC) 10 MG tablet Take 10 mg by mouth daily as needed for allergies.   clobetasol cream (TEMOVATE) 3.00 % Apply 1 application topically 2 (two) times daily.   lisinopril (ZESTRIL) 40 MG tablet Take 1 tablet by mouth once daily   metoprolol tartrate (LOPRESSOR) 100 MG tablet Take 1 tablet by mouth twice daily   pantoprazole (PROTONIX) 40 MG tablet Take 1 tablet by mouth once daily   pravastatin (PRAVACHOL) 40 MG tablet Take 1 tablet by mouth once daily   tamsulosin (FLOMAX) 0.4 MG CAPS capsule Take 1 capsule (0.4 mg total) by mouth daily.   No facility-administered encounter medications on file as of 04/21/2021.    Allergies (verified) Gabapentin, Sudafed [pseudoephedrine hcl], Prednisone, and Avelox [moxifloxacin hcl in nacl]   History: Past Medical History:  Diagnosis Date   Anxiety    Arthritis    CAD (coronary artery disease)  Nonobstructive cath 2012.  PCI to RCA 2003   Carotid artery disease (HCC)    Cataract    CHF (congestive heart failure) (HCC)    COPD (chronic obstructive pulmonary disease) (HCC)    Depression    Dyslipidemia    EtOH dependence (HCC)    GERD (gastroesophageal reflux disease)    Hyperlipidemia    Hypertension    Insomnia    Nonischemic cardiomyopathy (HCC)    EF was 20% (60% last echo 2015)   Pacemaker    and  defibrillator   Peptic ulcer disease    Tobacco abuse    Past Surgical History:  Procedure Laterality Date   BIV ICD GENERTAOR CHANGE OUT N/A 03/06/2014   [employer] Gen change by Dr Rayann Heman Beckie Salts CRT-D with LV1 header)   biventricular defibrillator implantation     [employer] and pacemaker   CATARACT EXTRACTION W/PHACO Right 12/10/2015   Procedure: CATARACT EXTRACTION PHACO AND INTRAOCULAR LENS PLACEMENT RIGHT EYE; CDE: 19.30;  Surgeon: Tonny Branch, MD;  Location: [facility];  Service: Ophthalmology;  Laterality: Right;   CATARACT EXTRACTION W/PHACO Left 01/14/2016   Procedure: CATARACT EXTRACTION PHACO AND INTRAOCULAR LENS PLACEMENT LEFT EYE; CDE:  8.03;  Surgeon: Tonny Branch, MD;  Location: [facility];  Service: Ophthalmology;  Laterality: Left;   COLONOSCOPY     EYE SURGERY     Ulcer surgery     repair of stomach ulcer   Family History  Problem Relation Age of Onset   Heart failure Father    Colon polyps Sister    GI Bleed Sister    GI Bleed Brother    Heart attack Brother    Psoriasis Daughter    Arthritis/Rheumatoid Daughter    Cancer Brother    Heart disease Brother    Heart disease Brother    Colon cancer Neg Hx    Esophageal cancer Neg Hx    Rectal cancer Neg Hx    Stomach cancer Neg Hx    Social History   Socioeconomic History   Marital status: Divorced    Spouse name: Not on file   Number of children: 4   Years of education: Some college   Highest education level: Some college, no degree  Occupational History   Occupation: Retired    Fish farm manager: [employer]  Tobacco Use   Smoking status: Light Smoker    Packs/day: 0.25    Years: 45.00    Pack years: 11.25    Types: Cigarettes   Smokeless tobacco: Former    Types: Chew    Quit date: 06/20/1989   Tobacco comments:    he is not interested in cessation  Vaping Use   Vaping Use: Never used  Substance and Sexual Activity   Alcohol use: Yes    Alcohol/week: 6.0 standard drinks    Types: 6 Cans of  beer per week    Comment: drings 4-6 beers per day   Drug use: No    Types: Marijuana    Comment: last used marijuana yesterday   Sexual activity: Not Currently    Birth control/protection: None  Other Topics Concern   Not on file  Social History Narrative   Divorced 2 sons 2 daughters   Is retired from Armed forces logistics/support/administrative officer, he was a Building control surveyor   3 alcoholic beverages daily no caffeine no drugs he still smokes no other tobacco or drug use   Social Determinants of Radio broadcast assistant Strain: Low Risk    Difficulty of  Paying Living Expenses: Not hard at all  Food Insecurity: No Food Insecurity   Worried About [place] in the Last Year: Never true   Ran Out of Food in the Last Year: Never true  Transportation Needs: No Transportation Needs   Lack of Transportation (Medical): No   Lack of Transportation (Non-Medical): No  Physical Activity: Inactive   Days of Exercise per Week: 0 days   Minutes of Exercise per Session: 0 min  Stress: No Stress Concern Present   Feeling of Stress : Only a little  Social Connections: Socially Isolated   Frequency of Communication with Friends and Family: More than three times a week   Frequency of Social Gatherings with Friends and Family: More than three times a week   Attends Religious Services: Never   Marine scientist or Organizations: No   Attends Music therapist: Never   Marital Status: Divorced    Tobacco Counseling Ready to quit: Not Answered Counseling given: Not Answered Tobacco comments: he is not interested in cessation   Clinical Intake:  Pre-visit preparation completed: Yes  Pain : No/denies pain     BMI - recorded: 20.2 Nutritional Status: BMI of 19-24  Normal Nutritional Risks: None Diabetes: No  How often do you need to have someone help you when you read instructions, pamphlets, or other written materials from your doctor or pharmacy?: 1 - Never  Diabetic?no  Interpreter  Needed?: No  Information entered by :: MJ Kino Dunsworth, LPN   Activities of Daily Living In your present state of health, do you have any difficulty performing the following activities: 04/21/2021  Hearing? N  Vision? N  Difficulty concentrating or making decisions? Y  Walking or climbing stairs? N  Dressing or bathing? N  Doing errands, shopping? N  Preparing Food and eating ? N  Using the Toilet? N  In the past six months, have you accidently leaked urine? Y  Do you have problems with loss of bowel control? N  Managing your Medications? N  Managing your Finances? N  Housekeeping or managing your Housekeeping? N  Some recent data might be hidden    Patient Care Team: Sharion Balloon, FNP as PCP - General (Nurse Practitioner) [name], MD as PCP - Electrophysiology (Cardiology) Minus Breeding, MD as PCP - Cardiology (Cardiology) Ilean China, RN as Registered Nurse  Indicate any recent Medical Services you may have received from other than [facility] providers in the past year (date may be approximate).     Assessment:   This is a routine wellness examination for [employer].  Hearing/Vision screen Hearing Screening - Comments:: No hearing issues.  Vision Screening - Comments:: Glasses. Goes to the [place] in [place]. 04/2021  Dietary issues and exercise activities discussed: Current Exercise Habits: The patient does not participate in regular exercise at present, Exercise limited by: cardiac condition(s);respiratory conditions(s)   Goals Addressed             This Visit's Progress    Exercise 150 min/wk Moderate Activity   Not on track    Quit Smoking         Depression Screen PHQ 2/9 Scores 04/21/2021 02/09/2021 12/17/2020 11/09/2020 08/13/2020 05/12/2020 05/13/2019  PHQ - 2 Score 1 0 0 6 0 3 0  PHQ- 9 Score - - - 8 - 8 -    Fall Risk Fall Risk  04/21/2021 02/09/2021 11/09/2020 08/13/2020 05/12/2020  Falls in the past year? 1 0 1 0 0  Number falls in past yr: 1 0 1 - -   Injury with Fall? 1 0 0 - -  Risk for fall due to : History of fall(s);Impaired balance/gait;Impaired mobility;Impaired vision - - - -  Follow up Falls prevention discussed Falls prevention discussed - - -    FALL RISK PREVENTION PERTAINING TO THE HOME:  Any stairs in or around the home? No  If so, are there any without handrails? No  Home free of loose throw rugs in walkways, pet beds, electrical cords, etc? Yes  Adequate lighting in your home to reduce risk of falls? Yes   ASSISTIVE DEVICES UTILIZED TO PREVENT FALLS:  Life alert? No  Use of a cane, walker or w/c? Yes  Grab bars in the bathroom? Yes  Shower chair or bench in shower? Yes  Elevated toilet seat or a handicapped toilet? Yes   TIMED UP AND GO:  Was the test performed? No . Phone visit.   Cognitive Function: MMSE - Mini Mental State Exam 10/16/2017  Orientation to time 3  Orientation to Place 4  Registration 3  Attention/ Calculation 2  Recall 1  Language- name 2 objects 2  Language- repeat 1  Language- follow 3 step command 3  Language- read & follow direction 1  Write a sentence 1  Copy design 1  Total score 22     6CIT Screen 04/21/2021 04/21/2021 04/18/2019  What Year? 0 points 0 points 0 points  What month? 0 points 0 points 0 points  What time? 0 points 0 points 0 points  Count back from 20 0 points 0 points 0 points  Months in reverse 0 points 0 points 0 points  Repeat phrase 2 points 2 points 4 points  Total Score 2 2 4     Immunizations Immunization History  Administered Date(s) Administered   Fluad Quad(high Dose 65+) 04/17/2019   Influenza Whole 05/13/2008   Influenza, High Dose Seasonal PF 04/24/2014, 04/09/2015, 03/28/2017, 04/24/2018, 02/22/2019, 02/25/2019   Influenza,inj,Quad PF,6+ Mos 03/15/2016   Influenza-Unspecified 03/20/2013, 04/22/2020   Moderna Sars-Covid-2 Vaccination 07/25/2019, 08/23/2019, 06/04/2020   Pneumococcal Conjugate-13 09/24/2015   Pneumococcal  Polysaccharide-23 03/28/2017   Tdap 10/31/2017    TDAP status: Up to date  Flu Vaccine status: Due, Education has been provided regarding the importance of this vaccine. Advised may receive this vaccine at local pharmacy or Health Dept. Aware to provide a copy of the vaccination record if obtained from local pharmacy or Health Dept. Verbalized acceptance and understanding.  Pneumococcal vaccine status: Up to date  Covid-19 vaccine status: Information provided on how to obtain vaccines.   Qualifies for Shingles Vaccine? Yes   Zostavax completed No   Shingrix Completed?: No.    Education has been provided regarding the importance of this vaccine. Patient has been advised to call insurance company to determine out of pocket expense if they have not yet received this vaccine. Advised may also receive vaccine at local pharmacy or Health Dept. Verbalized acceptance and understanding.  Screening Tests Health Maintenance  Topic Date Due   COLONOSCOPY (Pts 45-36yrs Insurance coverage will need to be confirmed)  01/10/2019   COVID-19 Vaccine (4 - Booster for Moderna series) 07/30/2020   Zoster Vaccines- Shingrix (1 of 2) 05/12/2021 (Originally 06/08/1960)   INFLUENZA VACCINE  09/17/2021 (Originally 01/18/2021)   TETANUS/TDAP  11/01/2027   Pneumonia Vaccine 19+ Years old  Completed   Hepatitis C Screening  Completed   HPV VACCINES  Aged Out    Health Maintenance  Health Maintenance Due  Topic Date Due   COLONOSCOPY (Pts 45-31yrs Insurance coverage will need to be confirmed)  01/10/2019   COVID-19 Vaccine (4 - Booster for Moderna series) 07/30/2020    Colorectal cancer screening: Referral to GI placed Cologuard ordered today. Pt aware the office will call re: appt.  Lung Cancer Screening: (Low Dose CT Chest recommended if Age 3-80 years, 30 pack-year currently smoking OR have quit w/in 15years.) does qualify.   Lung Cancer Screening Referral: Pt declined.  Additional  Screening:  Hepatitis C Screening: does not qualify; Completed 05/12/2020  Vision Screening: Recommended annual ophthalmology exams for early detection of glaucoma and other disorders of the eye. Is the patient up to date with their annual eye exam?  Yes  Who is the provider or what is the [facility] of the [facility] in which the patient [facility] [place] If pt is not established with a provider, would they like to be referred to a provider to establish care? No .   Dental Screening: Recommended annual dental exams for proper oral hygiene  Community Resource Referral / Chronic Care Management: CRR required this visit?  No   CCM required this visit?  No      Plan:     I have personally reviewed and noted the following in the patient's chart:   Medical and social history Use of alcohol, tobacco or illicit drugs  Current medications and supplements including opioid prescriptions. Patient is not currently taking opioid prescriptions. Functional ability and status Nutritional status Physical activity Advanced directives List of other physicians Hospitalizations, surgeries, and ER visits in previous 12 months Vitals Screenings to include cognitive, depression, and falls Referrals and appointments  In addition, I have reviewed and discussed with patient certain preventive protocols, quality metrics, and best practice recommendations. A written personalized care plan for preventive services as well as general preventive health recommendations were provided to patient.     Chriss Driver, LPN   [id number]/12/[id number]   Nurse Notes: Last colonoscopy done 01/10/2012 and was recommended to repeat in 7 years. Pt does not want to repeat but is willing to do a cologuard. Order placed today. Discussed flu and shingles vaccines. 6CIT score of 2.

## 2021-04-23 ENCOUNTER — Other Ambulatory Visit: Payer: Self-pay | Admitting: Family

## 2021-04-23 DIAGNOSIS — K219 Gastro-esophageal reflux disease without esophagitis: Secondary | ICD-10-CM

## 2021-04-26 ENCOUNTER — Ambulatory Visit (INDEPENDENT_AMBULATORY_CARE_PROVIDER_SITE_OTHER): Payer: Medicare Other

## 2021-04-26 DIAGNOSIS — I428 Other cardiomyopathies: Secondary | ICD-10-CM | POA: Diagnosis not present

## 2021-04-27 LAB — CUP PACEART REMOTE DEVICE CHECK
Battery Remaining Longevity: 36 mo
Battery Remaining Percentage: 54 %
Brady Statistic RA Percent Paced: 0 %
Brady Statistic RV Percent Paced: 100 %
Date Time Interrogation Session: 20221107003100
HighPow Impedance: 60 Ohm
Implantable Lead Implant Date: 20040312
Implantable Lead Implant Date: 20040312
Implantable Lead Implant Date: 20040312
Implantable Lead Location: 753858
Implantable Lead Location: 753859
Implantable Lead Location: 753860
Implantable Lead Model: 158
Implantable Lead Model: 4087
Implantable Lead Model: 4513
Implantable Lead Serial Number: 129158
Implantable Lead Serial Number: 209446
Implantable Lead Serial Number: 407264
Implantable Pulse Generator Implant Date: 20150917
Lead Channel Impedance Value: 1036 Ohm
Lead Channel Impedance Value: 556 Ohm
Lead Channel Impedance Value: 588 Ohm
Lead Channel Pacing Threshold Amplitude: 0.7 V
Lead Channel Pacing Threshold Amplitude: 1.2 V
Lead Channel Pacing Threshold Amplitude: 2.2 V
Lead Channel Pacing Threshold Pulse Width: 0.4 ms
Lead Channel Pacing Threshold Pulse Width: 0.4 ms
Lead Channel Pacing Threshold Pulse Width: 0.8 ms
Lead Channel Setting Pacing Amplitude: 2 V
Lead Channel Setting Pacing Amplitude: 2.5 V
Lead Channel Setting Pacing Amplitude: 3 V
Lead Channel Setting Pacing Pulse Width: 0.4 ms
Lead Channel Setting Pacing Pulse Width: 0.8 ms
Lead Channel Setting Sensing Sensitivity: 0.6 mV
Lead Channel Setting Sensing Sensitivity: 1 mV
Pulse Gen Serial Number: 106298

## 2021-04-30 NOTE — Progress Notes (Signed)
Remote ICD transmission.   

## 2021-05-03 ENCOUNTER — Encounter: Payer: Self-pay | Admitting: Family

## 2021-05-03 ENCOUNTER — Ambulatory Visit (INDEPENDENT_AMBULATORY_CARE_PROVIDER_SITE_OTHER): Payer: Medicare Other | Admitting: Family

## 2021-05-03 DIAGNOSIS — Z20828 Contact with and (suspected) exposure to other viral communicable diseases: Secondary | ICD-10-CM | POA: Diagnosis not present

## 2021-05-03 DIAGNOSIS — R6889 Other general symptoms and signs: Secondary | ICD-10-CM

## 2021-05-03 MED ORDER — OSELTAMIVIR PHOSPHATE 75 MG PO CAPS
75.0000 mg | ORAL_CAPSULE | Freq: Two times a day (BID) | ORAL | 0 refills | Status: DC
Start: 2021-05-03 — End: 2021-05-10

## 2021-05-03 MED ORDER — BENZONATATE 200 MG PO CAPS: 200.0000 mg | ORAL_CAPSULE | Freq: Three times a day (TID) | ORAL | 1 refills | Status: DC | PRN

## 2021-05-03 NOTE — Patient Instructions (Signed)
Influenza, Adult °Influenza, also called "the flu," is a viral infection that mainly affects the respiratory tract. This includes the lungs, nose, and throat. The flu spreads easily from person to person (is contagious). It causes common cold symptoms, along with high fever and body aches. °What are the causes? °This condition is caused by the influenza virus. You can get the virus by: °Breathing in droplets that are in the air from an infected person's cough or sneeze. °Touching something that has the virus on it (has been contaminated) and then touching your mouth, nose, or eyes. °What increases the risk? °The following factors may make you more likely to get the flu: °Not washing or sanitizing your hands often. °Having close contact with many people during cold and flu season. °Touching your mouth, eyes, or nose without first washing or sanitizing your hands. °Not getting an annual flu shot. °You may have a higher risk for the flu, including serious problems, such as a lung infection (pneumonia), if you: °Are older than 65. °Are pregnant. °Have a weakened disease-fighting system (immune system). This includes people who have HIV or AIDS, are on chemotherapy, or are taking medicines that reduce (suppress) the immune system. °Have a long-term (chronic) illness, such as heart disease, kidney disease, diabetes, or lung disease. °Have a liver disorder. °Are severely overweight (morbidly obese). °Have anemia. °Have asthma. °What are the signs or symptoms? °Symptoms of this condition usually begin suddenly and last 4-14 days. These may include: °Fever and chills. °Headaches, body aches, or muscle aches. °Sore throat. °Cough. °Runny or stuffy (congested) nose. °Chest discomfort. °Poor appetite. °Weakness or fatigue. °Dizziness. °Nausea or vomiting. °How is this diagnosed? °This condition may be diagnosed based on: °Your symptoms and medical history. °A physical exam. °Swabbing your nose or throat and testing the fluid  for the influenza virus. °How is this treated? °If the flu is diagnosed early, you can be treated with antiviral medicine that is given by mouth (orally) or through an IV. This can help reduce how severe the illness is and how long it lasts. °Taking care of yourself at home can help relieve symptoms. Your health care provider may recommend: °Taking over-the-counter medicines. °Drinking plenty of fluids. °In many cases, the flu goes away on its own. If you have severe symptoms or complications, you may be treated in a hospital. °Follow these instructions at home: °Activity °Rest as needed and get plenty of sleep. °Stay home from work or school as told by your health care provider. Unless you are visiting your health care provider, avoid leaving home until your fever has been gone for 24 hours without taking medicine. °Eating and drinking °Take an oral rehydration solution (ORS). This is a drink that is sold at pharmacies and retail stores. °Drink enough fluid to keep your urine pale yellow. °Drink clear fluids in small amounts as you are able. Clear fluids include water, ice chips, fruit juice mixed with water, and low-calorie sports drinks. °Eat bland, easy-to-digest foods in small amounts as you are able. These foods include bananas, applesauce, rice, lean meats, toast, and crackers. °Avoid drinking fluids that contain a lot of sugar or caffeine, such as energy drinks, regular sports drinks, and soda. °Avoid alcohol. °Avoid spicy or fatty foods. °General instructions °  °Take over-the-counter and prescription medicines only as told by your health care provider. °Use a cool mist humidifier to add humidity to the air in your home. This can make it easier to breathe. °When using a cool mist humidifier,   clean it daily. Empty the water and replace it with clean water. °Cover your mouth and nose when you cough or sneeze. °Wash your hands with soap and water often and for at least 20 seconds, especially after you cough or  sneeze. If soap and water are not available, use alcohol-based hand sanitizer. °Keep all follow-up visits. This is important. °How is this prevented? ° °Get an annual flu shot. This is usually available in late summer, fall, or winter. Ask your health care provider when you should get your flu shot. °Avoid contact with people who are sick during cold and flu season. This is generally fall and winter. °Contact a health care provider if: °You develop new symptoms. °You have: °Chest pain. °Diarrhea. °A fever. °Your cough gets worse. °You produce more mucus. °You feel nauseous or you vomit. °Get help right away if you: °Develop shortness of breath or have difficulty breathing. °Have skin or nails that turn a bluish color. °Have severe pain or stiffness in your neck. °Develop a sudden headache or sudden pain in your face or ear. °Cannot eat or drink without vomiting. °These symptoms may represent a serious problem that is an emergency. Do not wait to see if the symptoms will go away. Get medical help right away. Call your local emergency services (911 in the [place] Do not drive yourself to the hospital. °Summary °Influenza, also called "the flu," is a viral infection that primarily affects your respiratory tract. °Symptoms of the flu usually begin suddenly and last 4-14 days. °Getting an annual flu shot is the best way to prevent getting the flu. °Stay home from work or school as told by your health care provider. Unless you are visiting your health care provider, avoid leaving home until your fever has been gone for 24 hours without taking medicine. °Keep all follow-up visits. This is important. °This information is not intended to replace advice given to you by your health care provider. Make sure you discuss any questions you have with your health care provider. °Document Revised: 01/24/2020 Document Reviewed: 01/24/2020 °Elsevier Patient Education © 2022 [employer] ° °

## 2021-05-03 NOTE — Progress Notes (Signed)
Virtual Visit  Note Due to COVID-19 pandemic this visit was conducted virtually. This visit type was conducted due to national recommendations for restrictions regarding the COVID-19 Pandemic (e.g. social distancing, sheltering in place) in an effort to limit this patient's exposure and mitigate transmission in our community. All issues noted in this document were discussed and addressed.  A physical exam was not performed with this format.  I connected with [name] on 05/03/21 at 12:17 pm  by telephone and verified that I am speaking with the correct person using two identifiers. [name] is currently located at home and no one is currently with him during visit. The provider, Evelina Dun, FNP is located in their office at time of visit.  I discussed the limitations, risks, security and privacy concerns of performing an evaluation and management service by telephone and the availability of in person appointments. I also discussed with the patient that there may be a patient responsible charge related to this service. The patient expressed understanding and agreed to proceed.   History and Present Illness:  Pt calls the office today with flu like symptoms that started yesterday. States his grandchildren were diagnosed with flu and they were at his home this week.  Cough This is a new problem. The current episode started yesterday. The problem has been gradually worsening. The problem occurs every few minutes. The cough is Non-productive. Associated symptoms include chills, a fever, headaches, myalgias, nasal congestion, postnasal drip, shortness of breath and wheezing. Pertinent negatives include no ear congestion or ear pain. Risk factors for lung disease include smoking/tobacco exposure. He has tried rest for the symptoms. The treatment provided mild relief.    Review of Systems  Constitutional:  Positive for chills and fever.  HENT:  Positive for postnasal drip. Negative  for ear pain.   Respiratory:  Positive for cough, shortness of breath and wheezing.   Musculoskeletal:  Positive for myalgias.  Neurological:  Positive for headaches.    Observations/Objective: No SOB Or distress noted, constant tight nonproductive cough  Assessment and Plan: 1. Flu-like symptoms Rest, force fluids, tylenol as needed,port any worsening symptoms such as increased shortness of breath, swelling, or continued high fevers. Possible adverse effects discussed with antivirals.  - oseltamivir (TAMIFLU) 75 MG capsule; Take 1 capsule (75 mg total) by mouth 2 (two) times daily.  Dispense: 10 capsule; Refill: 0 - benzonatate (TESSALON) 200 MG capsule; Take 1 capsule (200 mg total) by mouth 3 (three) times daily as needed.  Dispense: 30 capsule; Refill: 1  2. Exposure to influenza - oseltamivir (TAMIFLU) 75 MG capsule; Take 1 capsule (75 mg total) by mouth 2 (two) times daily.  Dispense: 10 capsule; Refill: 0 - benzonatate (TESSALON) 200 MG capsule; Take 1 capsule (200 mg total) by mouth 3 (three) times daily as needed.  Dispense: 30 capsule; Refill: 1     I discussed the assessment and treatment plan with the patient. The patient was provided an opportunity to ask questions and all were answered. The patient agreed with the plan and demonstrated an understanding of the instructions.   The patient was advised to call back or seek an in-person evaluation if the symptoms worsen or if the condition fails to improve as anticipated.  The above assessment and management plan was discussed with the patient. The patient verbalized understanding of and has agreed to the management plan. Patient is aware to call the clinic if symptoms persist or worsen. Patient is aware when to return  to the clinic for a follow-up visit. Patient educated on when it is appropriate to go to the emergency department.   Time call ended: 12:29 pm    I provided 12 minutes of  non face-to-face time during this  encounter.    Evelina Dun, FNP

## 2021-05-10 ENCOUNTER — Encounter: Payer: Self-pay | Admitting: Family

## 2021-05-10 ENCOUNTER — Ambulatory Visit (INDEPENDENT_AMBULATORY_CARE_PROVIDER_SITE_OTHER): Payer: Medicare Other | Admitting: Family

## 2021-05-10 ENCOUNTER — Other Ambulatory Visit: Payer: Self-pay

## 2021-05-10 VITALS — BP 134/68 | HR 86 | Temp 96.4°F | Wt 125.6 lb

## 2021-05-10 DIAGNOSIS — K219 Gastro-esophageal reflux disease without esophagitis: Secondary | ICD-10-CM | POA: Diagnosis not present

## 2021-05-10 DIAGNOSIS — I700 Atherosclerosis of aorta: Secondary | ICD-10-CM | POA: Diagnosis not present

## 2021-05-10 DIAGNOSIS — F411 Generalized anxiety disorder: Secondary | ICD-10-CM

## 2021-05-10 DIAGNOSIS — Z79899 Other long term (current) drug therapy: Secondary | ICD-10-CM | POA: Diagnosis not present

## 2021-05-10 DIAGNOSIS — F17200 Nicotine dependence, unspecified, uncomplicated: Secondary | ICD-10-CM | POA: Diagnosis not present

## 2021-05-10 DIAGNOSIS — I25119 Atherosclerotic heart disease of native coronary artery with unspecified angina pectoris: Secondary | ICD-10-CM

## 2021-05-10 DIAGNOSIS — R350 Frequency of micturition: Secondary | ICD-10-CM

## 2021-05-10 DIAGNOSIS — I10 Essential (primary) hypertension: Secondary | ICD-10-CM

## 2021-05-10 DIAGNOSIS — N401 Enlarged prostate with lower urinary tract symptoms: Secondary | ICD-10-CM

## 2021-05-10 DIAGNOSIS — R0681 Apnea, not elsewhere classified: Secondary | ICD-10-CM

## 2021-05-10 DIAGNOSIS — G621 Alcoholic polyneuropathy: Secondary | ICD-10-CM

## 2021-05-10 DIAGNOSIS — E785 Hyperlipidemia, unspecified: Secondary | ICD-10-CM

## 2021-05-10 DIAGNOSIS — F1320 Sedative, hypnotic or anxiolytic dependence, uncomplicated: Secondary | ICD-10-CM

## 2021-05-10 DIAGNOSIS — F1010 Alcohol abuse, uncomplicated: Secondary | ICD-10-CM

## 2021-05-10 DIAGNOSIS — G4700 Insomnia, unspecified: Secondary | ICD-10-CM

## 2021-05-10 LAB — CMP14+EGFR
ALT: 21 IU/L (ref 0–44)
AST: 31 IU/L (ref 0–40)
Albumin/Globulin Ratio: 1.3 (ref 1.2–2.2)
Albumin: 4 g/dL (ref 3.7–4.7)
Alkaline Phosphatase: 90 IU/L (ref 44–121)
BUN/Creatinine Ratio: 11 (ref 10–24)
BUN: 11 mg/dL (ref 8–27)
Bilirubin Total: 0.8 mg/dL (ref 0.0–1.2)
CO2: 24 mmol/L (ref 20–29)
Calcium: 9.5 mg/dL (ref 8.6–10.2)
Chloride: 88 mmol/L — ABNORMAL LOW (ref 96–106)
Creatinine, Ser: 0.97 mg/dL (ref 0.76–1.27)
Globulin, Total: 3 g/dL (ref 1.5–4.5)
Glucose: 108 mg/dL — ABNORMAL HIGH (ref 70–99)
Potassium: 4 mmol/L (ref 3.5–5.2)
Sodium: 127 mmol/L — ABNORMAL LOW (ref 134–144)
Total Protein: 7 g/dL (ref 6.0–8.5)
eGFR: 80 mL/min/{1.73_m2} (ref >59)

## 2021-05-10 MED ORDER — ALPRAZOLAM 1 MG PO TABS
1.0000 mg | ORAL_TABLET | Freq: Two times a day (BID) | ORAL | 2 refills | Status: DC | PRN
Start: 2021-05-10 — End: 2021-08-12

## 2021-05-10 NOTE — Progress Notes (Signed)
Subjective:    Patient ID: [name], male    DOB: Apr 28, 1941, 80 y.o.   MRN: [id number]  Chief Complaint  Patient presents with   Medical Management of Chronic Issues   Pt presents to the office today for chronic follow up. PT is followed by Cardiologists for CAD, CHF, Atherosclerosis, Cardiomyopathy, and Ventricular tachycardia every 2 years.  PT has pacemaker and is followed annually for this.     Pt is followed by Dermatologists for psoriasis as needed.     He does report drinking 4-6 beers a day. He reports constant burning and aching pain of bilateral feet from alocholic peripheral neuropathy. He reports Lyrica is not working.  Gastroesophageal Reflux He complains of belching and heartburn. He reports no coughing. This is a chronic problem. The current episode started more than 1 year ago. The problem occurs frequently. The symptoms are aggravated by certain foods. Risk factors include ETOH use. He has tried a PPI for the symptoms. The treatment provided moderate relief.  Hypertension This is a chronic problem. The current episode started more than 1 year ago. The problem has been resolved since onset. The problem is controlled. Associated symptoms include anxiety and malaise/fatigue. Pertinent negatives include no peripheral edema or shortness of breath. Risk factors for coronary artery disease include dyslipidemia, male gender and sedentary lifestyle. The current treatment provides moderate improvement.  Insomnia Primary symptoms: difficulty falling asleep, frequent awakening, malaise/fatigue.   The current episode started more than one year. The onset quality is gradual. The problem occurs intermittently.  Anxiety Presents for follow-up visit. Symptoms include dry mouth, excessive worry, insomnia, irritability, nervous/anxious behavior and restlessness. Patient reports no shortness of breath. Symptoms occur occasionally. The severity of symptoms is moderate.    Nicotine  Dependence Presents for follow-up visit. Symptoms include insomnia and irritability. His urge triggers include company of smokers. The symptoms have been stable. He smokes < 1/2 a pack of cigarettes per day.  Benign Prostatic Hypertrophy This is a chronic problem. The current episode started more than 1 year ago. Irritative symptoms include frequency, nocturia and urgency.     Review of Systems  Constitutional:  Positive for irritability and malaise/fatigue.  Respiratory:  Negative for cough and shortness of breath.   Gastrointestinal:  Positive for heartburn.  Genitourinary:  Positive for frequency, nocturia and urgency.  Psychiatric/Behavioral:  The patient is nervous/anxious and has insomnia.   All other systems reviewed and are negative.     Objective:   Physical Exam Vitals reviewed.  Constitutional:      General: He is not in acute distress.    Appearance: He is well-developed.     Comments: underweight  HENT:     Head: Normocephalic.     Right Ear: External ear normal.     Left Ear: External ear normal.  Eyes:     General:        Right eye: No discharge.        Left eye: No discharge.     Pupils: Pupils are equal, round, and reactive to light.  Neck:     Thyroid: No thyromegaly.  Cardiovascular:     Rate and Rhythm: Normal rate and regular rhythm.     Heart sounds: Normal heart sounds. No murmur heard. Pulmonary:     Effort: Pulmonary effort is normal. No respiratory distress.     Breath sounds: Normal breath sounds. No wheezing.  Abdominal:     General: Bowel sounds are normal. There is no  distension.     Palpations: Abdomen is soft.     Tenderness: There is no abdominal tenderness.  Musculoskeletal:        General: No tenderness. Normal range of motion.     Cervical back: Normal range of motion and neck supple.  Skin:    General: Skin is warm and dry.     Findings: No erythema or rash.  Neurological:     Mental Status: He is alert and oriented to person,  place, and time.     Cranial Nerves: No cranial nerve deficit.     Deep Tendon Reflexes: Reflexes are normal and symmetric.  Psychiatric:        Behavior: Behavior normal.        Thought Content: Thought content normal.        Judgment: Judgment normal.      BP 134/68   Pulse 86   Temp (!) 96.4 F (35.8 C) (Temporal)   Wt 125 lb 9.6 oz (57 kg)   BMI 19.67 kg/m      Assessment & Plan:  [name] comes in today with chief complaint of Medical Management of Chronic Issues   Diagnosis and orders addressed:  1. Essential hypertension - CMP14+EGFR  2. Atherosclerosis of native coronary artery with angina pectoris, unspecified whether native or transplanted heart ([place]) - CMP14+EGFR  3. Aortic atherosclerosis (HCC) - CMP14+EGFR  4. Thoracic aorta atherosclerosis (HCC) - CMP14+EGFR  5. GERD with apnea - CMP14+EGFR  6. Gastroesophageal reflux disease, unspecified whether esophagitis present - CMP14+EGFR  7. TOBACCO ABUSE - CMP14+EGFR  8. Hyperlipidemia, unspecified hyperlipidemia type - CMP14+EGFR  9. GAD (generalized anxiety disorder) - CMP14+EGFR - ALPRAZolam (XANAX) 1 MG tablet; Take 1 tablet (1 mg total) by mouth 2 (two) times daily as needed. Keep appt w/ Alyse Low for refills  Dispense: 60 tablet; Refill: 2  10. Controlled substance agreement signed - Tox[employer]RE Select 13 (MW), Urine - CMP14+EGFR - ALPRAZolam (XANAX) 1 MG tablet; Take 1 tablet (1 mg total) by mouth 2 (two) times daily as needed. Keep appt w/ Alyse Low for refills  Dispense: 60 tablet; Refill: 2  11. Benzodiazepine dependence (HCC) - ToxASSURE Select 13 (MW), Urine - CMP14+EGFR - ALPRAZolam (XANAX) 1 MG tablet; Take 1 tablet (1 mg total) by mouth 2 (two) times daily as needed. Keep appt w/ Alyse Low for refills  Dispense: 60 tablet; Refill: 2  12. Insomnia, unspecified type - CMP14+EGFR  13. Alcoholic peripheral neuropathy (HCC) - CMP14+EGFR  14. Alcohol abuse  - CMP14+EGFR  15.  Benign prostatic hyperplasia with urinary frequency - CMP14+EGFR   Labs pending Health Maintenance reviewed Diet and exercise encouraged  Follow up plan: 3 months    Evelina Dun, FNP

## 2021-05-10 NOTE — Patient Instructions (Signed)

## 2021-05-17 ENCOUNTER — Ambulatory Visit: Payer: Medicare Other | Admitting: Family

## 2021-05-18 ENCOUNTER — Encounter: Payer: Self-pay | Admitting: Family

## 2021-05-18 LAB — TOXASSURE SELECT 13 (MW), URINE

## 2021-05-21 ENCOUNTER — Encounter: Payer: Self-pay | Admitting: Family Medicine

## 2021-05-21 ENCOUNTER — Other Ambulatory Visit: Payer: Self-pay | Admitting: Family

## 2021-05-21 ENCOUNTER — Ambulatory Visit (INDEPENDENT_AMBULATORY_CARE_PROVIDER_SITE_OTHER): Payer: Medicare Other | Admitting: Family Medicine

## 2021-05-21 VITALS — BP 122/66 | HR 74 | Temp 96.7°F | Ht 67.0 in | Wt 125.0 lb

## 2021-05-21 DIAGNOSIS — H6593 Unspecified nonsuppurative otitis media, bilateral: Secondary | ICD-10-CM | POA: Diagnosis not present

## 2021-05-21 DIAGNOSIS — I10 Essential (primary) hypertension: Secondary | ICD-10-CM

## 2021-05-21 MED ORDER — GUAIFENESIN ER 600 MG PO TB12: 1200.0000 mg | ORAL_TABLET | Freq: Two times a day (BID) | ORAL | 0 refills | Status: DC

## 2021-05-21 NOTE — Progress Notes (Signed)
Acute Office Visit  Subjective:    Patient ID: [name], male    DOB: Oct 27, 1940, 81 y.o.   MRN: [id number]  Chief Complaint  Patient presents with   Ear Fullness    HPI Patient is in today for ear fullness for about 1 week in both ears. His hearing has been muffled. He denies fever or drainage. He does have some slight head congestion. Denies facial pain or tenderness. He takes zyrtec and flonase daily. He recently had a viral URI but his symptoms have resolved from that.   Past Medical History:  Diagnosis Date   Anxiety    Arthritis    CAD (coronary artery disease)    Nonobstructive cath 2012.  PCI to RCA 2003   Carotid artery disease (HCC)    Cataract    CHF (congestive heart failure) (HCC)    COPD (chronic obstructive pulmonary disease) (HCC)    Depression    Dyslipidemia    EtOH dependence (HCC)    GERD (gastroesophageal reflux disease)    Hyperlipidemia    Hypertension    Insomnia    Nonischemic cardiomyopathy (HCC)    EF was 20% (60% last echo 2015)   Pacemaker    and defibrillator   Peptic ulcer disease    Tobacco abuse     Past Surgical History:  Procedure Laterality Date   BIV ICD GENERTAOR CHANGE OUT N/A 03/06/2014   [employer] Gen change by Dr Rayann Heman Beckie Salts CRT-D with LV1 header)   biventricular defibrillator implantation     [employer] and pacemaker   CATARACT EXTRACTION W/PHACO Right 12/10/2015   Procedure: CATARACT EXTRACTION PHACO AND INTRAOCULAR LENS PLACEMENT RIGHT EYE; CDE: 19.30;  Surgeon: Tonny Branch, MD;  Location: [facility];  Service: Ophthalmology;  Laterality: Right;   CATARACT EXTRACTION W/PHACO Left 01/14/2016   Procedure: CATARACT EXTRACTION PHACO AND INTRAOCULAR LENS PLACEMENT LEFT EYE; CDE:  8.03;  Surgeon: Tonny Branch, MD;  Location: [facility];  Service: Ophthalmology;  Laterality: Left;   COLONOSCOPY     EYE SURGERY     Ulcer surgery     repair of stomach ulcer    Family History  Problem Relation Age of Onset    Heart failure Father    Colon polyps Sister    GI Bleed Sister    GI Bleed Brother    Heart attack Brother    Psoriasis Daughter    Arthritis/Rheumatoid Daughter    Cancer Brother    Heart disease Brother    Heart disease Brother    Colon cancer Neg Hx    Esophageal cancer Neg Hx    Rectal cancer Neg Hx    Stomach cancer Neg Hx     Social History   Socioeconomic History   Marital status: Divorced    Spouse name: Not on file   Number of children: 4   Years of education: Some college   Highest education level: Some college, no degree  Occupational History   Occupation: Retired    Fish farm manager: [employer]  Tobacco Use   Smoking status: Light Smoker    Packs/day: 0.25    Years: 45.00    Pack years: 11.25    Types: Cigarettes   Smokeless tobacco: Former    Types: Chew    Quit date: 06/20/1989   Tobacco comments:    he is not interested in cessation  Vaping Use   Vaping Use: Never used  Substance and Sexual Activity   Alcohol use: Yes  Alcohol/week: 6.0 standard drinks    Types: 6 Cans of beer per week    Comment: drings 4-6 beers per day   Drug use: No    Types: Marijuana    Comment: last used marijuana yesterday   Sexual activity: Not Currently    Birth control/protection: None  Other Topics Concern   Not on file  Social History Narrative   Divorced 2 sons 2 daughters   Is retired from Armed forces logistics/support/administrative officer, he was a Building control surveyor   3 alcoholic beverages daily no caffeine no drugs he still smokes no other tobacco or drug use   Social Determinants of Radio broadcast assistant Strain: Low Risk    Difficulty of Paying Living Expenses: Not hard at all  Food Insecurity: No Food Insecurity   Worried About Charity fundraiser in the Last Year: Never true   Arboriculturist in the Last Year: Never true  Transportation Needs: No Transportation Needs   Lack of Transportation (Medical): No   Lack of Transportation (Non-Medical): No  Physical Activity: Inactive    Days of Exercise per Week: 0 days   Minutes of Exercise per Session: 0 min  Stress: No Stress Concern Present   Feeling of Stress : Only a little  Social Connections: Socially Isolated   Frequency of Communication with Friends and Family: More than three times a week   Frequency of Social Gatherings with Friends and Family: More than three times a week   Attends Religious Services: Never   Marine scientist or Organizations: No   Attends Music therapist: Never   Marital Status: Divorced  Human resources officer Violence: Not At Risk   Fear of Current or Ex-Partner: No   Emotionally Abused: No   Physically Abused: No   Sexually Abused: No    Outpatient Medications Prior to Visit  Medication Sig Dispense Refill   albuterol (VENTOLIN HFA) 108 (90 Base) MCG/ACT inhaler Inhale 1 puff into the lungs every 6 (six) hours as needed for shortness of breath. 8 g 2   ALPRAZolam (XANAX) 1 MG tablet Take 1 tablet (1 mg total) by mouth 2 (two) times daily as needed. Keep appt w/ Alyse Low for refills 60 tablet 2   amLODipine (NORVASC) 10 MG tablet Take 1 tablet by mouth once daily 90 tablet 1   aspirin 81 MG tablet Take 81 mg by mouth daily.     cetirizine (ZYRTEC) 10 MG tablet Take 10 mg by mouth daily as needed for allergies.     clobetasol cream (TEMOVATE) 9.47 % Apply 1 application topically 2 (two) times daily.     lisinopril (ZESTRIL) 40 MG tablet Take 1 tablet by mouth once daily 90 tablet 3   metoprolol tartrate (LOPRESSOR) 100 MG tablet Take 1 tablet by mouth twice daily 180 tablet 2   pantoprazole (PROTONIX) 40 MG tablet Take 1 tablet by mouth once daily 90 tablet 0   pravastatin (PRAVACHOL) 40 MG tablet Take 1 tablet by mouth once daily 90 tablet 0   tamsulosin (FLOMAX) 0.4 MG CAPS capsule Take 1 capsule (0.4 mg total) by mouth daily. 30 capsule 3   No facility-administered medications prior to visit.    Allergies  Allergen Reactions   Gabapentin Anaphylaxis   Sudafed  [Pseudoephedrine Hcl] Other (See Comments)    Unknown   Prednisone Other (See Comments)    Depression and "personality changes".   Avelox [Moxifloxacin Hcl In Nacl] Other (See Comments)    Unknown  Review of Systems As per HPI.     Objective:    Physical Exam Vitals and nursing note reviewed.  Constitutional:      General: He is not in acute distress.    Appearance: He is not ill-appearing, toxic-appearing or diaphoretic.  HENT:     Head: Normocephalic and atraumatic.     Right Ear: Ear canal and external ear normal. A middle ear effusion is present. There is no impacted cerumen. No mastoid tenderness. Tympanic membrane is not injected, perforated, erythematous, retracted or bulging.     Left Ear: Ear canal and external ear normal. A middle ear effusion is present. There is no impacted cerumen. No mastoid tenderness. Tympanic membrane is not injected, perforated, erythematous, retracted or bulging.     Nose: Nose normal.     Right Sinus: No maxillary sinus tenderness or frontal sinus tenderness.     Left Sinus: No maxillary sinus tenderness or frontal sinus tenderness.  Cardiovascular:     Rate and Rhythm: Normal rate and regular rhythm.     Heart sounds: Normal heart sounds. No murmur heard. Pulmonary:     Effort: Pulmonary effort is normal. No respiratory distress.     Breath sounds: Normal breath sounds.  Musculoskeletal:     Right lower leg: No edema.     Left lower leg: No edema.  Skin:    General: Skin is warm and dry.  Neurological:     General: No focal deficit present.     Mental Status: He is alert and oriented to person, place, and time.  Psychiatric:        Mood and Affect: Mood normal.        Behavior: Behavior normal.    BP 122/66   Pulse 74   Temp (!) 96.7 F (35.9 C) (Temporal)   Ht '5\' 7"'  (1.702 m)   Wt 125 lb (56.7 kg)   SpO2 96%   BMI 19.58 kg/m  Wt Readings from Last 3 Encounters:  05/21/21 125 lb (56.7 kg)  05/10/21 125 lb 9.6 oz (57 kg)   04/21/21 129 lb (58.5 kg)    Health Maintenance Due  Topic Date Due   Zoster Vaccines- Shingrix (1 of 2) Never done   COLONOSCOPY (Pts 45-65yr Insurance coverage will need to be confirmed)  01/10/2019   COVID-19 Vaccine (4 - Booster for Moderna series) 07/30/2020    There are no preventive care reminders to display for this patient.   Lab Results  Component Value Date   TSH 1.63 07/03/2014   Lab Results  Component Value Date   WBC 6.5 08/13/2020   HGB 11.5 (L) 08/13/2020   HCT 34.1 (L) 08/13/2020   MCV 94 08/13/2020   PLT 317 08/13/2020   Lab Results  Component Value Date   NA 127 (L) 05/10/2021   K 4.0 05/10/2021   CO2 24 05/10/2021   GLUCOSE 108 (H) 05/10/2021   BUN 11 05/10/2021   CREATININE 0.97 05/10/2021   BILITOT 0.8 05/10/2021   ALKPHOS 90 05/10/2021   AST 31 05/10/2021   ALT 21 05/10/2021   PROT 7.0 05/10/2021   ALBUMIN 4.0 05/10/2021   CALCIUM 9.5 05/10/2021   ANIONGAP 8 12/08/2015   EGFR 80 05/10/2021   GFR 75.02 07/12/2013   Lab Results  Component Value Date   CHOL 194 08/13/2020   Lab Results  Component Value Date   HDL 97 08/13/2020   Lab Results  Component Value Date   LDLCALC 89 08/13/2020  Lab Results  Component Value Date   TRIG 42 08/13/2020   Lab Results  Component Value Date   CHOLHDL 2.0 08/13/2020   Lab Results  Component Value Date   HGBA1C 5.7 09/07/2010       Assessment & Plan:   Derak was seen today for ear fullness.  Diagnoses and all orders for this visit:  Middle ear effusion, bilateral Continue zyrtec and flonase. Add mucinex. Handout given. Return to office for new or worsening symptoms, or if symptoms persist.  -     guaiFENesin (MUCINEX) 600 MG 12 hr tablet; Take 2 tablets (1,200 mg total) by mouth 2 (two) times daily.  The patient indicates understanding of these issues and agrees with the plan.   Gwenlyn Perking, FNP

## 2021-05-21 NOTE — Patient Instructions (Signed)
Otitis Media With Effusion, Adult Otitis media with effusion (OME) is inflammation and fluid (effusion) in the middle ear without having an ear infection. The middle ear is the space behind the eardrum. The middle ear is connected to the back of the throat by a narrow tube (eustachian tube). Normally the eustachian tube drains fluid out of the middle ear. A swollen eustachian tube can become blocked and cause fluid to collect in the middle ear. OME often goes away without treatment. Sometimes OME can lead to hearing problems and recurrent acute ear infections (acute otitis media). These conditions may require treatment. What are the causes? OME is caused by a blocked eustachian tube. This can result from: Allergies. Upper respiratory infections. Enlarged adenoids. The adenoids are areas of soft tissue located high in the back of the throat, behind the nose and the roof of the mouth. They are part of the body's natural defense system (immune system). Rapid changes in pressure, like when an airplane is descending or during scuba diving. In some cases, the cause of this condition is not known. What are the signs or symptoms? Common symptoms of this condition include: A feeling of fullness in your ear. Decreased hearing in the affected ear. Fluid draining into the ear canal. Pain in the ear. In some cases, there are no symptoms. How is this diagnosed? A health care provider can diagnose OME based on signs and symptoms of the condition. Your provider will also do a physical exam to check for fluid behind the eardrum. During the exam, your health care provider will use an instrument called an otoscope to look in your ear. Your health care provider may do other tests, such as: A hearing test. A tympanogram. This is a test that shows how well the eardrum moves in response to air pressure in the ear canal. It provides a graph for your health care provider to review. A pneumatic otoscopy. This is a test  to check how your eardrum moves in response to changes in pressure. It is done by squeezing a small amount of air into the ear. How is this treated? Treatment for OME depends on the cause of the condition and the severity of symptoms. The first step is often waiting to see if the fluid drains on its own in a few weeks. Home care treatment may include: Over-the-counter pain relievers. A warm, moist cloth placed over the ear. Severe cases may require a procedure to insert tubes in the ears (tympanostomy tubes) to drain the fluid. Follow these instructions at home: Take over-the-counter and prescription medicines only as told by your health care provider. Keep all follow-up visits. Contact a health care provider if: You have pain that gets worse. Hearing in your affected ear gets worse. You have fluid draining from your ear canal. You have dizziness. You develop a fever. Get help right away if: You develop a severe headache. You completely lose hearing in the affected ear. You have bleeding from your ear canal. You have sudden and severe pain in your ear. These symptoms may represent a serious problem that is an emergency. Do not wait to see if the symptoms will go away. Get medical help right away. Call your local emergency services (911 in the [place] Do not drive yourself to the hospital. Summary Otitis media with effusion (OME) is inflammation and fluid (effusion) in the middle ear without having an ear infection. A swollen eustachian tube can become blocked and cause fluid to collect in the middle ear.  Treatment for OME depends on the cause of the condition and the severity of symptoms. Many times, treatment is not needed because the fluid drains on its own in a few weeks. Sometimes OME can lead to hearing problems and recurrent acute ear infections (acute otitis media), which may require treatment. This information is not intended to replace advice given to you by your health care  provider. Make sure you discuss any questions you have with your health care provider. Document Revised: 10/01/2020 Document Reviewed: 10/01/2020 Elsevier Patient Education  [employer].

## 2021-06-04 ENCOUNTER — Other Ambulatory Visit: Payer: Self-pay | Admitting: Internal Medicine

## 2021-06-04 DIAGNOSIS — E785 Hyperlipidemia, unspecified: Secondary | ICD-10-CM

## 2021-06-22 DIAGNOSIS — G4737 Central sleep apnea in conditions classified elsewhere: Secondary | ICD-10-CM | POA: Diagnosis not present

## 2021-06-22 DIAGNOSIS — R063 Periodic breathing: Secondary | ICD-10-CM | POA: Diagnosis not present

## 2021-07-16 ENCOUNTER — Telehealth: Payer: Self-pay

## 2021-07-16 NOTE — Telephone Encounter (Signed)
[facility] alert received- New onset AF, episode ~ 9 hours starting on 07/13/21  22:31.    No documentation of previous history of AF.    Reviewed episode and presenting rate with Dr. Quentin Ore in clinic.  Confirm AF and presenting rate appears episode has ended.   Per Dr. Quentin Ore, patient is to be referred to AF clinic.    Attempted to reach patient, no answer.  LVM with device clinic # and hours to return phone call.         Presenting

## 2021-07-19 NOTE — Telephone Encounter (Signed)
Attempted to contact patient about AF and recommendation. No answer, LMTCB.

## 2021-07-21 NOTE — Telephone Encounter (Signed)
Spoke with patient's daughter, Vaughan Basta (DPR on file)  Advised of AF episode and explained risks.  This is new diagnosis for patient.  Advised of Dr. Mardene Speak recommendation for AF clinic.  She is agreeable.

## 2021-07-22 NOTE — Telephone Encounter (Signed)
Called and spoke with daughter, Durenda Age on file, she is agreeable to appt 07/27/21 with AFib Clinic.  Directions and phone number for Clinic provided to daughter.

## 2021-07-26 ENCOUNTER — Other Ambulatory Visit: Payer: Self-pay | Admitting: Family

## 2021-07-26 ENCOUNTER — Ambulatory Visit (INDEPENDENT_AMBULATORY_CARE_PROVIDER_SITE_OTHER): Payer: Medicare Other

## 2021-07-26 DIAGNOSIS — K219 Gastro-esophageal reflux disease without esophagitis: Secondary | ICD-10-CM

## 2021-07-26 DIAGNOSIS — I428 Other cardiomyopathies: Secondary | ICD-10-CM

## 2021-07-27 ENCOUNTER — Ambulatory Visit (HOSPITAL_COMMUNITY)
Admission: RE | Admit: 2021-07-27 | Discharge: 2021-07-27 | Disposition: A | Discharge status: Home / Self Care | Payer: Medicare Other | Source: Ambulatory Visit | Attending: Nurse Practitioner | Admitting: Nurse Practitioner

## 2021-07-27 ENCOUNTER — Other Ambulatory Visit: Payer: Self-pay

## 2021-07-27 VITALS — BP 130/56 | HR 66 | Ht 67.0 in | Wt 123.6 lb

## 2021-07-27 DIAGNOSIS — D6869 Other thrombophilia: Secondary | ICD-10-CM | POA: Diagnosis not present

## 2021-07-27 DIAGNOSIS — I2510 Atherosclerotic heart disease of native coronary artery without angina pectoris: Secondary | ICD-10-CM | POA: Insufficient documentation

## 2021-07-27 DIAGNOSIS — F17210 Nicotine dependence, cigarettes, uncomplicated: Secondary | ICD-10-CM | POA: Insufficient documentation

## 2021-07-27 DIAGNOSIS — F1010 Alcohol abuse, uncomplicated: Secondary | ICD-10-CM | POA: Diagnosis not present

## 2021-07-27 DIAGNOSIS — I5082 Biventricular heart failure: Secondary | ICD-10-CM | POA: Insufficient documentation

## 2021-07-27 DIAGNOSIS — I10 Essential (primary) hypertension: Secondary | ICD-10-CM | POA: Diagnosis not present

## 2021-07-27 DIAGNOSIS — I480 Paroxysmal atrial fibrillation: Secondary | ICD-10-CM | POA: Diagnosis not present

## 2021-07-27 DIAGNOSIS — Z7982 Long term (current) use of aspirin: Secondary | ICD-10-CM | POA: Insufficient documentation

## 2021-07-27 DIAGNOSIS — I428 Other cardiomyopathies: Secondary | ICD-10-CM | POA: Insufficient documentation

## 2021-07-27 LAB — CUP PACEART REMOTE DEVICE CHECK
Battery Remaining Longevity: 30 mo
Battery Remaining Percentage: 48 %
Brady Statistic RA Percent Paced: 0 %
Brady Statistic RV Percent Paced: 99 %
Date Time Interrogation Session: 20230206003100
HighPow Impedance: 58 Ohm
Implantable Lead Implant Date: 20040312
Implantable Lead Implant Date: 20040312
Implantable Lead Implant Date: 20040312
Implantable Lead Location: 753858
Implantable Lead Location: 753859
Implantable Lead Location: 753860
Implantable Lead Model: 158
Implantable Lead Model: 4087
Implantable Lead Model: 4513
Implantable Lead Serial Number: 129158
Implantable Lead Serial Number: 209446
Implantable Lead Serial Number: 407264
Implantable Pulse Generator Implant Date: 20150917
Lead Channel Impedance Value: 1066 Ohm
Lead Channel Impedance Value: 546 Ohm
Lead Channel Impedance Value: 568 Ohm
Lead Channel Pacing Threshold Amplitude: 0.7 V
Lead Channel Pacing Threshold Amplitude: 1.2 V
Lead Channel Pacing Threshold Amplitude: 2.2 V
Lead Channel Pacing Threshold Pulse Width: 0.4 ms
Lead Channel Pacing Threshold Pulse Width: 0.4 ms
Lead Channel Pacing Threshold Pulse Width: 0.8 ms
Lead Channel Setting Pacing Amplitude: 2 V
Lead Channel Setting Pacing Amplitude: 2.5 V
Lead Channel Setting Pacing Amplitude: 3 V
Lead Channel Setting Pacing Pulse Width: 0.4 ms
Lead Channel Setting Pacing Pulse Width: 0.8 ms
Lead Channel Setting Sensing Sensitivity: 0.6 mV
Lead Channel Setting Sensing Sensitivity: 1 mV
Pulse Gen Serial Number: 106298

## 2021-07-27 MED ORDER — APIXABAN 5 MG PO TABS: 5.0000 mg | ORAL_TABLET | Freq: Two times a day (BID) | ORAL | 3 refills | Status: DC

## 2021-07-27 NOTE — Patient Instructions (Signed)
Stop aspirin  Start Eliquis 5mg  twice a day  Follow up with Dr. Rayann Heman as scheduled

## 2021-07-28 ENCOUNTER — Encounter (HOSPITAL_COMMUNITY): Payer: Self-pay | Admitting: Nurse Practitioner

## 2021-07-28 NOTE — Progress Notes (Signed)
Primary Care Physician: Sharion Balloon, FNP Referring Physician: Device clinic/Dr. Quentin Ore  Cardiologist: Dr. Percival Spanish EP: Dr. Theressa Millard is a 81 y.o. male with a h/o NICM, CAD, tobacco and alcohol abuse, VT, BiV ICD,  chronic systolic HF, HTN, that is here from device clinic of noting 9 hours of new onset afib. Pt is in SR today. He was not aware of afib when it  occurred.   Pt currently is on ASA alone, remote stent. He states that he drinks 5-6 beers a day and smokes 1/2 to 1/4 pack of cigarettes a day. He states that he bruises easily just on asa (alcohol contributing). States he has neuropathy and has an occasional fall, maybe a couple per year.    He is currently on metoprolol tartrate 100 mg bid.  Today, he denies symptoms of palpitations, chest pain, shortness of breath, orthopnea, PND, lower extremity edema, dizziness, presyncope, syncope, or neurologic sequela. The patient is tolerating medications without difficulties and is otherwise without complaint today.   Past Medical History:  Diagnosis Date   Anxiety    Arthritis    CAD (coronary artery disease)    Nonobstructive cath 2012.  PCI to RCA 2003   Carotid artery disease (HCC)    Cataract    CHF (congestive heart failure) (HCC)    COPD (chronic obstructive pulmonary disease) (HCC)    Depression    Dyslipidemia    EtOH dependence (HCC)    GERD (gastroesophageal reflux disease)    Hyperlipidemia    Hypertension    Insomnia    Nonischemic cardiomyopathy (HCC)    EF was 20% (60% last echo 2015)   Pacemaker    and defibrillator   Peptic ulcer disease    Tobacco abuse    Past Surgical History:  Procedure Laterality Date   BIV ICD GENERTAOR CHANGE OUT N/A 03/06/2014   [employer] Gen change by Dr Rayann Heman Beckie Salts CRT-D with LV1 header)   biventricular defibrillator implantation     [employer] and pacemaker   CATARACT EXTRACTION W/PHACO Right 12/10/2015   Procedure: CATARACT EXTRACTION  PHACO AND INTRAOCULAR LENS PLACEMENT RIGHT EYE; CDE: 19.30;  Surgeon: Tonny Branch, MD;  Location: [facility];  Service: Ophthalmology;  Laterality: Right;   CATARACT EXTRACTION W/PHACO Left 01/14/2016   Procedure: CATARACT EXTRACTION PHACO AND INTRAOCULAR LENS PLACEMENT LEFT EYE; CDE:  8.03;  Surgeon: Tonny Branch, MD;  Location: [facility];  Service: Ophthalmology;  Laterality: Left;   COLONOSCOPY     EYE SURGERY     Ulcer surgery     repair of stomach ulcer    Current Outpatient Medications  Medication Sig Dispense Refill   albuterol (VENTOLIN HFA) 108 (90 Base) MCG/ACT inhaler Inhale 1 puff into the lungs every 6 (six) hours as needed for shortness of breath. 8 g 2   ALPRAZolam (XANAX) 1 MG tablet Take 1 tablet (1 mg total) by mouth 2 (two) times daily as needed. Keep appt w/ Alyse Low for refills 60 tablet 2   amLODipine (NORVASC) 10 MG tablet Take 1 tablet by mouth once daily 90 tablet 0   apixaban (ELIQUIS) 5 MG TABS tablet Take 1 tablet (5 mg total) by mouth 2 (two) times daily. 60 tablet 3   cetirizine (ZYRTEC) 10 MG tablet Take 10 mg by mouth daily as needed for allergies.     guaiFENesin (MUCINEX) 600 MG 12 hr tablet Take 2 tablets (1,200 mg total) by mouth 2 (two) times daily. (Patient  taking differently: Take 1,200 mg by mouth as needed.) 30 tablet 0   lisinopril (ZESTRIL) 40 MG tablet Take 1 tablet by mouth once daily 90 tablet 3   metoprolol tartrate (LOPRESSOR) 100 MG tablet Take 1 tablet by mouth twice daily 180 tablet 2   Multiple Vitamin (MULTIVITAMIN) capsule Take 1 capsule by mouth daily.     pantoprazole (PROTONIX) 40 MG tablet Take 1 tablet by mouth once daily 90 tablet 0   pravastatin (PRAVACHOL) 40 MG tablet Take 1 tablet by mouth once daily 90 tablet 0   tamsulosin (FLOMAX) 0.4 MG CAPS capsule Take 1 capsule (0.4 mg total) by mouth daily. 30 capsule 3   No current facility-administered medications for this encounter.    Allergies  Allergen Reactions   Gabapentin Anaphylaxis    Sudafed [Pseudoephedrine Hcl] Other (See Comments)    Unknown   Prednisone Other (See Comments)    Depression and "personality changes".   Avelox [Moxifloxacin Hcl In Nacl] Other (See Comments)    Unknown     Social History   Socioeconomic History   Marital status: Divorced    Spouse name: Not on file   Number of children: 4   Years of education: Some college   Highest education level: Some college, no degree  Occupational History   Occupation: Retired    Fish farm manager: [employer]  Tobacco Use   Smoking status: Light Smoker    Packs/day: 0.25    Years: 45.00    Pack years: 11.25    Types: Cigarettes   Smokeless tobacco: Former    Types: Chew    Quit date: 06/20/1989   Tobacco comments:    he is not interested in cessation  Vaping Use   Vaping Use: Never used  Substance and Sexual Activity   Alcohol use: Yes    Alcohol/week: 6.0 standard drinks    Types: 6 Cans of beer per week    Comment: drings 4-6 beers per day   Drug use: No    Types: Marijuana    Comment: last used marijuana yesterday   Sexual activity: Not Currently    Birth control/protection: None  Other Topics Concern   Not on file  Social History Narrative   Divorced 2 sons 2 daughters   Is retired from [employer] forces logistics/support/administrative officer, he was a Building control surveyor   3 alcoholic beverages daily no caffeine no drugs he still smokes no other tobacco or drug use   Social Determinants of Radio broadcast assistant Strain: Low Risk    Difficulty of Paying Living Expenses: Not hard at all  Food Insecurity: No Food Insecurity   Worried About Charity fundraiser in the Last Year: Never true   Arboriculturist in the Last Year: Never true  Transportation Needs: No Transportation Needs   Lack of Transportation (Medical): No   Lack of Transportation (Non-Medical): No  Physical Activity: Inactive   Days of Exercise per Week: 0 days   Minutes of Exercise per Session: 0 min  Stress: No Stress Concern Present   Feeling of Stress :  Only a little  Social Connections: Socially Isolated   Frequency of Communication with Friends and Family: More than three times a week   Frequency of Social Gatherings with Friends and Family: More than three times a week   Attends Religious Services: Never   Marine scientist or Organizations: No   Attends Archivist Meetings: Never   Marital Status: Divorced  Human resources officer  Violence: Not At Risk   Fear of Current or Ex-Partner: No   Emotionally Abused: No   Physically Abused: No   Sexually Abused: No    Family History  Problem Relation Age of Onset   Heart failure Father    Colon polyps Sister    GI Bleed Sister    GI Bleed Brother    Heart attack Brother    Psoriasis Daughter    Arthritis/Rheumatoid Daughter    Cancer Brother    Heart disease Brother    Heart disease Brother    Colon cancer Neg Hx    Esophageal cancer Neg Hx    Rectal cancer Neg Hx    Stomach cancer Neg Hx     ROS- All systems are reviewed and negative except as per the HPI above  Physical Exam: Vitals:   07/27/21 0938  BP: (!) 130/56  Pulse: 66  Weight: 56.1 kg  Height: 5\' 7"  (1.702 m)   Wt Readings from Last 3 Encounters:  07/27/21 56.1 kg  05/21/21 56.7 kg  05/10/21 57 kg    Labs: Lab Results  Component Value Date   NA 127 (L) 05/10/2021   K 4.0 05/10/2021   CL 88 (L) 05/10/2021   CO2 24 05/10/2021   GLUCOSE 108 (H) 05/10/2021   BUN 11 05/10/2021   CREATININE 0.97 05/10/2021   CALCIUM 9.5 05/10/2021   Lab Results  Component Value Date   INR 0.8 RATIO 07/24/2007   Lab Results  Component Value Date   CHOL 194 08/13/2020   HDL 97 08/13/2020   LDLCALC 89 08/13/2020   TRIG 42 08/13/2020     GEN- The patient is well appearing, alert and oriented x 3 today.   Head- normocephalic, atraumatic Eyes-  Sclera clear, conjunctiva pink Ears- hearing intact Oropharynx- clear Neck- supple, no JVP Lymph- no cervical lymphadenopathy Lungs- Clear to ausculation  bilaterally, normal work of breathing Heart- Regular rate and rhythm, no murmurs, rubs or gallops, PMI not laterally displaced GI- soft, NT, ND, + BS Extremities- no clubbing, cyanosis, or edema MS- no significant deformity or atrophy Skin- no rash or lesion Psych- euthymic mood, full affect Neuro- strength and sensation are intact  EKG-Vent. rate 66 BPM PR interval 142 ms QRS duration 150 ms QT/QTcB 448/469 ms P-R-T axes 78 270 84 Atrial-sensed ventricular-paced rhythm Biventricular pacemaker detected Abnormal ECG When compared with ECG of 24-Jun-2014 14:26, PREVIOUS ECG IS PRESENT No significant change since last tracing   Device clinic note-BSX Lat[facility] alert received- New onset AF, episode ~ 9 hours starting on 07/13/21  22:31.    No documentation of previous history of AF.     Reviewed episode and presenting rate with Dr. Quentin Ore in clinic.  Confirm AF and presenting rate appears episode has ended.    Per Dr. Quentin Ore, patient is to be referred to AF clinic.        Assessment and Plan:  1. New onset  afib Pt was asymptomatic General education re afib He is in SR today Continue metoprolol tartrate 100 mg bid  Triggers discussed, encouraged to stop smoking  Continue to monitor afib burden per device    2. CHA2DS2VASc score of at least 5 Discussed anticoagulation I am concerned with his significant alcohol intake increasing risk of bleeding, discussed this at length with pt and daughter  Bleeding precautions discussed He was highly encouraged to stop alcohol use or significantly decrease I will stop asa as that would also increase risk of bleeding with  remote (2003) stent placement Will inform Dr. Percival Spanish  Will start eliquis 5 mg bid, 30 day free card given    3. HTN Stable  4. CAD No current symptoms   5. BIV ICD device F/u with Dr. Rayann Heman as scheduled 08/27/21 in [place] He prefers his care to be in [place] office as he lives in that area He will need a  f/u CBC/BMET at that visit to f/u on new anticoagulation on board   Butch Penny C. Sharise Lippy, [facility] [address] [place], [place] [address] [phone]

## 2021-07-29 NOTE — Progress Notes (Signed)
Remote ICD transmission.   

## 2021-08-12 ENCOUNTER — Ambulatory Visit (INDEPENDENT_AMBULATORY_CARE_PROVIDER_SITE_OTHER): Payer: Medicare Other | Admitting: Family

## 2021-08-12 ENCOUNTER — Encounter: Payer: Self-pay | Admitting: Family

## 2021-08-12 VITALS — BP 150/60 | HR 67 | Temp 97.8°F | Ht 67.0 in | Wt 127.0 lb

## 2021-08-12 DIAGNOSIS — Z79899 Other long term (current) drug therapy: Secondary | ICD-10-CM | POA: Diagnosis not present

## 2021-08-12 DIAGNOSIS — F1320 Sedative, hypnotic or anxiolytic dependence, uncomplicated: Secondary | ICD-10-CM

## 2021-08-12 DIAGNOSIS — J432 Centrilobular emphysema: Secondary | ICD-10-CM

## 2021-08-12 DIAGNOSIS — Z23 Encounter for immunization: Secondary | ICD-10-CM

## 2021-08-12 DIAGNOSIS — I10 Essential (primary) hypertension: Secondary | ICD-10-CM | POA: Diagnosis not present

## 2021-08-12 DIAGNOSIS — F411 Generalized anxiety disorder: Secondary | ICD-10-CM

## 2021-08-12 DIAGNOSIS — G621 Alcoholic polyneuropathy: Secondary | ICD-10-CM

## 2021-08-12 DIAGNOSIS — I700 Atherosclerosis of aorta: Secondary | ICD-10-CM

## 2021-08-12 DIAGNOSIS — E785 Hyperlipidemia, unspecified: Secondary | ICD-10-CM

## 2021-08-12 DIAGNOSIS — G4700 Insomnia, unspecified: Secondary | ICD-10-CM | POA: Diagnosis not present

## 2021-08-12 DIAGNOSIS — F1010 Alcohol abuse, uncomplicated: Secondary | ICD-10-CM

## 2021-08-12 DIAGNOSIS — K219 Gastro-esophageal reflux disease without esophagitis: Secondary | ICD-10-CM

## 2021-08-12 DIAGNOSIS — F17200 Nicotine dependence, unspecified, uncomplicated: Secondary | ICD-10-CM

## 2021-08-12 DIAGNOSIS — I5022 Chronic systolic (congestive) heart failure: Secondary | ICD-10-CM | POA: Diagnosis not present

## 2021-08-12 DIAGNOSIS — R0681 Apnea, not elsewhere classified: Secondary | ICD-10-CM

## 2021-08-12 LAB — CBC WITH DIFFERENTIAL/PLATELET
Basophils Absolute: 0.1 10*3/uL (ref 0.0–0.2)
Basos: 1 %
EOS (ABSOLUTE): 0.2 10*3/uL (ref 0.0–0.4)
Eos: 2 %
Hematocrit: 33.8 % — ABNORMAL LOW (ref 37.5–51.0)
Hemoglobin: 11.5 g/dL — ABNORMAL LOW (ref 13.0–17.7)
Immature Grans (Abs): 0 10*3/uL (ref 0.0–0.1)
Immature Granulocytes: 0 %
Lymphocytes Absolute: 2 10*3/uL (ref 0.7–3.1)
Lymphs: 24 %
MCH: 32.2 pg (ref 26.6–33.0)
MCHC: 34 g/dL (ref 31.5–35.7)
MCV: 95 fL (ref 79–97)
Monocytes Absolute: 1.1 10*3/uL — ABNORMAL HIGH (ref 0.1–0.9)
Monocytes: 14 %
Neutrophils Absolute: 4.7 10*3/uL (ref 1.4–7.0)
Neutrophils: 59 %
Platelets: 325 10*3/uL (ref 150–450)
RBC: 3.57 x10E6/uL — ABNORMAL LOW (ref 4.14–5.80)
RDW: 12.7 % (ref 11.6–15.4)
WBC: 8.1 10*3/uL (ref 3.4–10.8)

## 2021-08-12 LAB — CMP14+EGFR
ALT: 18 IU/L (ref 0–44)
AST: 34 IU/L (ref 0–40)
Albumin/Globulin Ratio: 1.7 (ref 1.2–2.2)
Albumin: 4.5 g/dL (ref 3.7–4.7)
Alkaline Phosphatase: 84 IU/L (ref 44–121)
BUN/Creatinine Ratio: 14 (ref 10–24)
BUN: 15 mg/dL (ref 8–27)
Bilirubin Total: 0.3 mg/dL (ref 0.0–1.2)
CO2: 24 mmol/L (ref 20–29)
Calcium: 9.7 mg/dL (ref 8.6–10.2)
Chloride: 94 mmol/L — ABNORMAL LOW (ref 96–106)
Creatinine, Ser: 1.06 mg/dL (ref 0.76–1.27)
Globulin, Total: 2.6 g/dL (ref 1.5–4.5)
Glucose: 97 mg/dL (ref 70–99)
Potassium: 5.2 mmol/L (ref 3.5–5.2)
Sodium: 130 mmol/L — ABNORMAL LOW (ref 134–144)
Total Protein: 7.1 g/dL (ref 6.0–8.5)
eGFR: 72 mL/min/{1.73_m2} (ref >59)

## 2021-08-12 LAB — LIPID PANEL
Chol/HDL Ratio: 2.2 ratio (ref 0.0–5.0)
Cholesterol, Total: 190 mg/dL (ref 100–199)
HDL: 86 mg/dL (ref >39)
LDL Chol Calc (NIH): 94 mg/dL (ref 0–99)
Triglycerides: 54 mg/dL (ref 0–149)
VLDL Cholesterol Cal: 10 mg/dL (ref 5–40)

## 2021-08-12 MED ORDER — ALPRAZOLAM 1 MG PO TABS: 1.0000 mg | ORAL_TABLET | Freq: Two times a day (BID) | ORAL | 2 refills | Status: DC | PRN

## 2021-08-12 NOTE — Patient Instructions (Signed)
High-Protein and High-Calorie Diet Eating high-protein and high-calorie foods can help you to gain weight, heal after an injury, and recover after an illness or surgery. The specific amount of daily protein and calories you need depends on: Your body weight. The reason this diet is recommended for you. Generally, a high-protein, high-calorie diet involves: Eating 250-500 extra calories each day. Making sure that you get enough of your daily calories from protein. Ask your health care provider how many of your calories should come from protein. Talk with a health care provider or a dietitian about how much protein and how many calories you need each day. Follow the diet as directed by your health care provider. What are tips for following this plan? Reading food labels Check the nutrition facts label for calories, grams of fat and protein. Items with more than 4 grams of protein are high-protein foods. Preparing meals Add whole milk, half-and-half, or heavy cream to cereal, pudding, soup, or hot cocoa. Add whole milk to instant breakfast drinks. Add peanut butter to oatmeal or smoothies. Add powdered milk to baked goods, smoothies, or milkshakes. Add powdered milk, cream, or butter to mashed potatoes. Add cheese to cooked vegetables. Make whole-milk yogurt parfaits. Top them with granola, fruit, or nuts. Add cottage cheese to fruit. Add avocado, cheese, or both to sandwiches or salads. Add avocado to smoothies. Add meat, poultry, or seafood to rice, pasta, casseroles, salads, and soups. Use mayonnaise when making egg salad, chicken salad, or tuna salad. Use peanut butter as a dip for fruits and vegetables or as a topping for pretzels, celery, or crackers. Add beans to casseroles, dips, and spreads. Add pureed beans to sauces and soups. Replace calorie-free drinks with calorie-containing drinks, such as milk and fruit juice. Replace water with milk or heavy cream when making foods such as  oatmeal, pudding, or cocoa. Add oil or butter to cooked vegetables and grains. Add cream cheese to sandwiches or as a topping on crackers and bread. Make cream-based pastas and soups. General information Ask your health care provider if you should take a nutritional supplement. Try to eat six small meals each day instead of three large meals. A general goal is to eat every 2 to 3 hours. Eat a balanced diet. In each meal, include one food that is high in protein and one food with fat in it. Keep nutritious snacks available, such as nuts, trail mixes, dried fruit, and yogurt. If you have kidney disease or diabetes, talk with your health care provider about how much protein is safe for you. Too much protein may put extra stress on your kidneys. Drink your calories. Choose high-calorie drinks and have them after your meals. Consider setting a timer to remind you to eat. You will want to eat even if you do not feel very hungry. What high-protein foods should I eat? Vegetables Soybeans. Peas. Grains Quinoa. Bulgur wheat. Buckwheat. Meats and other proteins Beef, pork, and poultry. Fish and seafood. Eggs. Tofu. Textured vegetable protein (TVP). Peanut butter. Nuts and seeds. Dried beans. Protein powders. Hummus. Dairy Whole milk. Whole-milk yogurt. Powdered milk. Cheese. Cottage Cheese. Eggnog. Beverages High-protein supplement drinks. Soy milk. Other foods Protein bars. The items listed above may not be a complete list of foods and beverages you can eat and drink. Contact a dietitian for more information. What high-calorie foods should I eat? Fruits Dried fruit. Fruit leather. Canned fruit in syrup. Fruit juice. Avocado. Vegetables Vegetables cooked in oil or butter. Fried potatoes. Grains Pasta. Quick   breads. Muffins. Pancakes. Ready-to-eat cereal. Meats and other proteins Peanut butter. Nuts and seeds. Dairy Heavy cream. Whipped cream. Cream cheese. Sour cream. Ice cream. Custard.  Pudding. Whole milk dairy products. Beverages Meal-replacement beverages. Nutrition shakes. Fruit juice. Seasonings and condiments Salad dressing. Mayonnaise. Alfredo sauce. Fruit preserves or jelly. Honey. Syrup. Sweets and desserts Cake. Cookies. Pie. Pastries. Candy bars. Chocolate. Fats and oils Butter or margarine. Oil. Gravy. Other foods Meal-replacement bars. The items listed above may not be a complete list of foods and beverages you can eat and drink. Contact a dietitian for more information. Summary A high-protein, high-calorie diet can help you gain weight or heal faster after an injury, illness, or surgery. To increase your protein and calories, add ingredients such as whole milk, peanut butter, cheese, beans, meat, or seafood to meal items. To get enough extra calories each day, include high-calorie foods and beverages at each meal. Adding a high-calorie drink or shake can be an easy way to help you get enough calories each day. Talk with your healthcare provider or dietitian about the best options for you. This information is not intended to replace advice given to you by your health care provider. Make sure you discuss any questions you have with your health care provider. Document Revised: 05/10/2020 Document Reviewed: 05/10/2020 Elsevier Patient Education  2022 Elsevier Inc.  

## 2021-08-12 NOTE — Progress Notes (Signed)
Subjective:    Patient ID: [name], male    DOB: [birth year], 81 y.o.   MRN: [id number]  Chief Complaint  Patient presents with   Medical Management of Chronic Issues   Pt presents to the office today for chronic follow up. PT is followed by Cardiologists for CAD, CHF, Atherosclerosis, Cardiomyopathy, and Ventricular tachycardia every 2 years.  PT has pacemaker and is followed annually for this.     Pt is followed by Dermatologists for psoriasis as needed.     He does report drinking 4-5 beers a day. He reports constant burning and aching pain of bilateral feet from alocholic peripheral neuropathy that is 6 out 10. He has taken Lyric without relief.   He has aortic atheroscleroses and takes pravastatin daily.  He has emphysema and smokes 1/4 a pack a day. Denies any SOB. Hypertension This is a chronic problem. The current episode started more than 1 year ago. The problem has been resolved since onset. The problem is controlled. Associated symptoms include anxiety. Pertinent negatives include no malaise/fatigue, peripheral edema or shortness of breath. Risk factors for coronary artery disease include dyslipidemia, obesity, male gender, smoking/tobacco exposure and sedentary lifestyle. The current treatment provides moderate improvement.  Anxiety Presents for follow-up visit. Symptoms include depressed mood, excessive worry, insomnia and nervous/anxious behavior. Patient reports no irritability or shortness of breath. Symptoms occur occasionally. The severity of symptoms is moderate.    Hyperlipidemia This is a chronic problem. The current episode started more than 1 year ago. The problem is controlled. Pertinent negatives include no shortness of breath. Current antihyperlipidemic treatment includes statins. The current treatment provides moderate improvement of lipids. Risk factors for coronary artery disease include dyslipidemia, hypertension, male sex and a sedentary lifestyle.   Insomnia Primary symptoms: difficulty falling asleep, frequent awakening, no malaise/fatigue.   The current episode started more than one year. The onset quality is gradual. The problem occurs intermittently.  Gastroesophageal Reflux He complains of belching and heartburn. This is a chronic problem. The current episode started more than 1 year ago. The problem occurs occasionally. Pertinent negatives include no fatigue. He has tried a PPI for the symptoms. The treatment provided moderate relief.  Congestive Heart Failure Presents for follow-up visit. Pertinent negatives include no edema, fatigue or shortness of breath. The symptoms have been stable.     Review of Systems  Constitutional:  Negative for fatigue, irritability and malaise/fatigue.  Respiratory:  Negative for shortness of breath.   Gastrointestinal:  Positive for heartburn.  Psychiatric/Behavioral:  The patient is nervous/anxious and has insomnia.   All other systems reviewed and are negative.     Objective:   Physical Exam Vitals reviewed.  Constitutional:      General: He is not in acute distress.    Appearance: He is well-developed.  HENT:     Head: Normocephalic.     Right Ear: Tympanic membrane normal.     Left Ear: Tympanic membrane normal.  Eyes:     General:        Right eye: No discharge.        Left eye: No discharge.     Pupils: Pupils are equal, round, and reactive to light.  Neck:     Thyroid: No thyromegaly.  Cardiovascular:     Rate and Rhythm: Normal rate and regular rhythm.     Heart sounds: Normal heart sounds. No murmur heard. Pulmonary:     Effort: Pulmonary effort is normal. No respiratory distress.  Breath sounds: Wheezing and rhonchi present.  Abdominal:     General: Bowel sounds are normal. There is no distension.     Palpations: Abdomen is soft.     Tenderness: There is no abdominal tenderness.  Musculoskeletal:        General: No tenderness. Normal range of motion.     Cervical  back: Normal range of motion and neck supple.  Skin:    General: Skin is warm and dry.     Findings: No erythema or rash.  Neurological:     Mental Status: He is alert and oriented to person, place, and time.     Cranial Nerves: No cranial nerve deficit.     Deep Tendon Reflexes: Reflexes are normal and symmetric.  Psychiatric:        Behavior: Behavior normal.        Thought Content: Thought content normal.        Judgment: Judgment normal.         BP (!) 150/60    Pulse 67    Temp 97.8 F (36.6 C) (Temporal)    Ht '5\' 7"'  (1.702 m)    Wt 127 lb (57.6 kg)    BMI 19.89 kg/m   Assessment & Plan:  [name] comes in today with chief complaint of Medical Management of Chronic Issues   Diagnosis and orders addressed:  1. GAD (generalized anxiety disorder) - ALPRAZolam (XANAX) 1 MG tablet; Take 1 tablet (1 mg total) by mouth 2 (two) times daily as needed. Keep appt w/ Alyse Low for refills  Dispense: 60 tablet; Refill: 2 - CBC with Differential/Platelet - CMP14+EGFR  2. Controlled substance agreement signed - ALPRAZolam (XANAX) 1 MG tablet; Take 1 tablet (1 mg total) by mouth 2 (two) times daily as needed. Keep appt w/ Alyse Low for refills  Dispense: 60 tablet; Refill: 2 - CBC with Differential/Platelet - CMP14+EGFR  3. Benzodiazepine dependence (HCC) - ALPRAZolam (XANAX) 1 MG tablet; Take 1 tablet (1 mg total) by mouth 2 (two) times daily as needed. Keep appt w/ Alyse Low for refills  Dispense: 60 tablet; Refill: 2 - CBC with Differential/Platelet - CMP14+EGFR  4. Aortic atherosclerosis (HCC) - CBC with Differential/Platelet - CMP14+EGFR  5. Chronic systolic heart failure (HCC) - CBC with Differential/Platelet - CMP14+EGFR  6. Essential hypertension - CBC with Differential/Platelet - CMP14+EGFR  7. GERD with apnea - CBC with Differential/Platelet - CMP14+EGFR  8. Centrilobular emphysema ([place]) - CBC with Differential/Platelet - CMP14+EGFR  9. Alcoholic  peripheral neuropathy (HCC) - CBC with Differential/Platelet - CMP14+EGFR  10. Gastroesophageal reflux disease, unspecified whether esophagitis present - CBC with Differential/Platelet - CMP14+EGFR  11. Alcohol abuse - CBC with Differential/Platelet - CMP14+EGFR  12. Hyperlipidemia, unspecified hyperlipidemia type - CBC with Differential/Platelet - CMP14+EGFR - Lipid panel  13. Insomnia, unspecified type - CBC with Differential/Platelet - CMP14+EGFR  14. TOBACCO ABUSE - CBC with Differential/Platelet - CMP14+EGFR   Labs pending Patient reviewed in [place] controlled database, no flags noted. Contract and drug screen are up to date on 05/10/21  Health Maintenance reviewed Diet and exercise encouraged  Follow up plan: 3 months   Evelina Dun, FNP

## 2021-08-20 ENCOUNTER — Ambulatory Visit (HOSPITAL_COMMUNITY): Admission: RE | Admit: 2021-08-20 | Payer: Medicare Other | Source: Ambulatory Visit

## 2021-08-20 ENCOUNTER — Encounter: Payer: Medicare Other | Admitting: Internal Medicine

## 2021-08-21 ENCOUNTER — Other Ambulatory Visit: Payer: Self-pay | Admitting: Family

## 2021-08-21 ENCOUNTER — Other Ambulatory Visit: Payer: Self-pay | Admitting: Internal Medicine

## 2021-08-21 DIAGNOSIS — I10 Essential (primary) hypertension: Secondary | ICD-10-CM

## 2021-08-27 ENCOUNTER — Encounter: Payer: Self-pay | Admitting: Internal Medicine

## 2021-08-27 ENCOUNTER — Ambulatory Visit (INDEPENDENT_AMBULATORY_CARE_PROVIDER_SITE_OTHER): Payer: Medicare Other | Admitting: Internal Medicine

## 2021-08-27 ENCOUNTER — Other Ambulatory Visit: Payer: Self-pay

## 2021-08-27 VITALS — BP 160/74 | HR 63 | Ht 67.0 in | Wt 128.6 lb

## 2021-08-27 DIAGNOSIS — I472 Ventricular tachycardia, unspecified: Secondary | ICD-10-CM | POA: Diagnosis not present

## 2021-08-27 DIAGNOSIS — I428 Other cardiomyopathies: Secondary | ICD-10-CM | POA: Diagnosis not present

## 2021-08-27 DIAGNOSIS — I480 Paroxysmal atrial fibrillation: Secondary | ICD-10-CM | POA: Diagnosis not present

## 2021-08-27 DIAGNOSIS — D6869 Other thrombophilia: Secondary | ICD-10-CM

## 2021-08-27 DIAGNOSIS — I10 Essential (primary) hypertension: Secondary | ICD-10-CM

## 2021-08-27 NOTE — Patient Instructions (Signed)
Medication Instructions:  ?Continue all current medications. ? ?Labwork: ?none ? ?Testing/Procedures: ?none ? ?Follow-Up: ?1 year - Dr.  Rayann Heman  ?Overdue for Hochrein in [place]  ? ?Any Other Special Instructions Will Be Listed Below (If Applicable). ? ? ?If you need a refill on your cardiac medications before your next appointment, please call your pharmacy. ? ?

## 2021-08-27 NOTE — Progress Notes (Signed)
? ?PCP: Sharion Balloon, FNP ?Primary Cardiologist: Dr Percival Spanish ?Primary EP: Dr Rayann Heman ? ?[name] is a 81 y.o. male who presents today for routine electrophysiology followup.  Since last being seen in our clinic, the patient reports doing very well.  He fell several weeks ago but denies sustained injury.  He drinks 4-5 beers per day.  He had 9 hours of afib in January for which he was asymptomatic.  He was seen in AF clinic and started on eliquis.  Today, he denies symptoms of palpitations, chest pain, shortness of breath,  lower extremity edema, dizziness, presyncope, syncope, or ICD shocks.  The patient is otherwise without complaint today.  ? ?Past Medical History:  ?Diagnosis Date  ? Anxiety   ? Arthritis   ? CAD (coronary artery disease)   ? Nonobstructive cath 2012.  PCI to RCA 2003  ? Carotid artery disease ([place])   ? Cataract   ? CHF (congestive heart failure) ([place])   ? COPD (chronic obstructive pulmonary disease) ([place])   ? Depression   ? Dyslipidemia   ? EtOH dependence ([place])   ? GERD (gastroesophageal reflux disease)   ? Hyperlipidemia   ? Hypertension   ? Insomnia   ? Nonischemic cardiomyopathy ([place])   ? EF was 20% (60% last echo 2015)  ? Pacemaker   ? and defibrillator  ? Peptic ulcer disease   ? Tobacco abuse   ? ?Past Surgical History:  ?Procedure Laterality Date  ? BIV ICD GENERTAOR CHANGE OUT N/A 03/06/2014  ? [employer] Gen change by Dr Rayann Heman Beckie Salts CRT-D with LV1 header)  ? biventricular defibrillator implantation    ? Chemical engineer and pacemaker  ? CATARACT EXTRACTION W/PHACO Right 12/10/2015  ? Procedure: CATARACT EXTRACTION PHACO AND INTRAOCULAR LENS PLACEMENT RIGHT EYE; CDE: 19.30;  Surgeon: Tonny Branch, MD;  Location: [facility];  Service: Ophthalmology;  Laterality: Right;  ? CATARACT EXTRACTION W/PHACO Left 01/14/2016  ? Procedure: CATARACT EXTRACTION PHACO AND INTRAOCULAR LENS PLACEMENT LEFT EYE; CDE:  8.03;  Surgeon: Tonny Branch, MD;  Location: [facility];  Service:  Ophthalmology;  Laterality: Left;  ? COLONOSCOPY    ? EYE SURGERY    ? Ulcer surgery    ? repair of stomach ulcer  ? ? ?ROS- all systems are reviewed and negative except as per HPI above ? ?Current Outpatient Medications  ?Medication Sig Dispense Refill  ? albuterol (VENTOLIN HFA) 108 (90 Base) MCG/ACT inhaler Inhale 1 puff into the lungs every 6 (six) hours as needed for shortness of breath. 8 g 2  ? ALPRAZolam (XANAX) 1 MG tablet Take 1 tablet (1 mg total) by mouth 2 (two) times daily as needed. Keep appt w/ Alyse Low for refills 60 tablet 2  ? amLODipine (NORVASC) 10 MG tablet Take 1 tablet by mouth once daily 90 tablet 1  ? apixaban (ELIQUIS) 5 MG TABS tablet Take 1 tablet (5 mg total) by mouth 2 (two) times daily. 60 tablet 3  ? cetirizine (ZYRTEC) 10 MG tablet Take 10 mg by mouth daily as needed for allergies.    ? guaiFENesin (MUCINEX) 600 MG 12 hr tablet Take 2 tablets (1,200 mg total) by mouth 2 (two) times daily. (Patient taking differently: Take 1,200 mg by mouth as needed.) 30 tablet 0  ? lisinopril (ZESTRIL) 40 MG tablet Take 1 tablet by mouth once daily 90 tablet 3  ? metoprolol tartrate (LOPRESSOR) 100 MG tablet Take 1 tablet by mouth twice daily 180 tablet 3  ? Multiple  Vitamin (MULTIVITAMIN) capsule Take 1 capsule by mouth daily.    ? pantoprazole (PROTONIX) 40 MG tablet Take 1 tablet by mouth once daily 90 tablet 0  ? pravastatin (PRAVACHOL) 40 MG tablet Take 1 tablet by mouth once daily 90 tablet 0  ? tamsulosin (FLOMAX) 0.4 MG CAPS capsule Take 1 capsule (0.4 mg total) by mouth daily. 30 capsule 3  ? ?No current facility-administered medications for this visit.  ? ? ?Physical Exam: ?Vitals:  ? 08/27/21 0953  ?BP: (!) 160/74  ?Pulse: 63  ?SpO2: 97%  ?Weight: 128 lb 9.6 oz (58.3 kg)  ?Height: '5\' 7"'$  (1.702 m)  ? ? ?GEN- The patient is well appearing, alert and oriented x 3 today.   ?Head- normocephalic, atraumatic ?Eyes-  Sclera clear, conjunctiva pink ?Ears- hearing intact ?Oropharynx- clear ?Lungs-  Clear to ausculation bilaterally, normal work of breathing ?Chest- ICD pocket is well healed ?Heart- Regular rate and rhythm, no murmurs, rubs or gallops, PMI not laterally displaced ?GI- soft, NT, ND, + BS ?Extremities- no clubbing, cyanosis, or edema ? ?ICD interrogation- reviewed in detail today,  See PACEART report ?  ? ?Wt Readings from Last 3 Encounters:  ?08/27/21 128 lb 9.6 oz (58.3 kg)  ?08/12/21 127 lb (57.6 kg)  ?07/27/21 123 lb 9.6 oz (56.1 kg)  ? ?Ecg 07/27/21 is reviewed and reveals sinus with BiV pacing ? ?Assessment and Plan: ? ?1.  Chronic systolic dysfunction/ nonischemic CM ?euvolemic today ?EF recovered with CRT ?Stable on an appropriate medical regimen ?Normal BiV ICD function ?See Claudia Desanctis Art report ?No changes today ?he is not device dependant today ? ?2. VT ?Previously noted ?Current well controlled ? ?3. HTN ?Stable ?No change required today ? ?4. Paroxysmal atrial fibrillation ?previously noted ?AF clinic notes reviewed ?ETOh reduction advised ?Chads2vasc scoreis at least 5 ?Started on eliquis ?Labs 08/12/21 noted ? ? ?Risks, benefits and potential toxicities for medications prescribed and/or refilled reviewed with patient today.  ? ?[name] MD, FACC ?08/27/2021 ?10:03 AM ? ?

## 2021-09-11 ENCOUNTER — Other Ambulatory Visit: Payer: Self-pay | Admitting: Internal Medicine

## 2021-09-11 DIAGNOSIS — E785 Hyperlipidemia, unspecified: Secondary | ICD-10-CM

## 2021-09-14 ENCOUNTER — Encounter: Payer: Self-pay | Admitting: Family Medicine

## 2021-09-21 DIAGNOSIS — G4737 Central sleep apnea in conditions classified elsewhere: Secondary | ICD-10-CM | POA: Diagnosis not present

## 2021-09-21 DIAGNOSIS — R063 Periodic breathing: Secondary | ICD-10-CM | POA: Diagnosis not present

## 2021-10-05 ENCOUNTER — Telehealth (INDEPENDENT_AMBULATORY_CARE_PROVIDER_SITE_OTHER): Payer: Medicare Other | Admitting: Adult Health

## 2021-10-05 DIAGNOSIS — G4733 Obstructive sleep apnea (adult) (pediatric): Secondary | ICD-10-CM | POA: Diagnosis not present

## 2021-10-05 NOTE — Progress Notes (Signed)
? ? ? ?PATIENT: [name] ?DOB: 06/22/1940 ? ?REASON FOR VISIT: follow up ?HISTORY FROM: patient ?PRIMARY NEUROLOGIST:  ? ?Virtual Visit via Video Note ? ?I connected with [name] on 10/05/21 at  1:00 PM EDT by a video enabled telemedicine application located remotely at [facility] and verified that I am speaking with the correct person using two identifiers who was located at their own home. ?  ?I discussed the limitations of evaluation and management by telemedicine and the availability of in person appointments. The patient expressed understanding and agreed to proceed. ? ? ?PATIENT: [name] ?DOB: [birth year] ? ?REASON FOR VISIT: follow up ?HISTORY FROM: patient ? ?HISTORY OF PRESENT ILLNESS: ?Today 10/05/21: ? ?[name] is a 81 year old male with a history of obstructive sleep apnea on CPAP.  He returns today for follow-up.  His download is below.  He still continues to have a high leak.  He has had 1 mask refitting in the past but at the last visit another mask refitting was ordered but reports that he was never called to set this up.  He states if he pulls the mask too tight it makes indentions on his face. ? ? ? ?REVIEW OF SYSTEMS: Out of a complete 14 system review of symptoms, the patient complains only of the following symptoms, and all other reviewed systems are negative. ? ?ALLERGIES: ?Allergies  ?Allergen Reactions  ? Gabapentin Anaphylaxis  ? Sudafed [Pseudoephedrine Hcl] Other (See Comments)  ?  Unknown  ? Prednisone Other (See Comments)  ?  Depression and "personality changes".  ? Avelox [Moxifloxacin Hcl In Nacl] Other (See Comments)  ?  Unknown   ? ? ?HOME MEDICATIONS: ?Outpatient Medications Prior to Visit  ?Medication Sig Dispense Refill  ? albuterol (VENTOLIN HFA) 108 (90 Base) MCG/ACT inhaler Inhale 1 puff into the lungs every 6 (six) hours as needed for shortness of breath. 8 g 2  ? ALPRAZolam (XANAX) 1 MG tablet Take 1 tablet (1 mg total) by  mouth 2 (two) times daily as needed. Keep appt w/ [name] for refills 60 tablet 2  ? amLODipine (NORVASC) 10 MG tablet Take 1 tablet by mouth once daily 90 tablet 1  ? apixaban (ELIQUIS) 5 MG TABS tablet Take 1 tablet (5 mg total) by mouth 2 (two) times daily. 60 tablet 3  ? cetirizine (ZYRTEC) 10 MG tablet Take 10 mg by mouth daily as needed for allergies.    ? guaiFENesin (MUCINEX) 600 MG 12 hr tablet Take 2 tablets (1,200 mg total) by mouth 2 (two) times daily. (Patient taking differently: Take 1,200 mg by mouth as needed.) 30 tablet 0  ? lisinopril (ZESTRIL) 40 MG tablet Take 1 tablet by mouth once daily 90 tablet 3  ? metoprolol tartrate (LOPRESSOR) 100 MG tablet Take 1 tablet by mouth twice daily 180 tablet 3  ? Multiple Vitamin (MULTIVITAMIN) capsule Take 1 capsule by mouth daily.    ? pantoprazole (PROTONIX) 40 MG tablet Take 1 tablet by mouth once daily 90 tablet 0  ? pravastatin (PRAVACHOL) 40 MG tablet Take 1 tablet by mouth once daily 90 tablet 2  ? tamsulosin (FLOMAX) 0.4 MG CAPS capsule Take 1 capsule (0.4 mg total) by mouth daily. 30 capsule 3  ? ?No facility-administered medications prior to visit.  ? ? ?PAST MEDICAL HISTORY: ?Past Medical History:  ?Diagnosis Date  ? Anxiety   ? Arthritis   ? CAD (coronary artery disease)   ? Nonobstructive cath 2012.  PCI  to RCA 2003  ? Carotid artery disease ([place])   ? Cataract   ? CHF (congestive heart failure) ([place])   ? COPD (chronic obstructive pulmonary disease) ([place])   ? Depression   ? Dyslipidemia   ? EtOH dependence ([place])   ? GERD (gastroesophageal reflux disease)   ? Hyperlipidemia   ? Hypertension   ? Insomnia   ? Nonischemic cardiomyopathy ([place])   ? EF was 20% (60% last echo 2015)  ? Pacemaker   ? and defibrillator  ? Peptic ulcer disease   ? Tobacco abuse   ? ? ?PAST SURGICAL HISTORY: ?Past Surgical History:  ?Procedure Laterality Date  ? BIV ICD GENERTAOR CHANGE OUT N/A 03/06/2014  ? [employer] Gen change by Dr [name] CRT-D with LV1  header)  ? biventricular defibrillator implantation    ? Chemical engineer and pacemaker  ? CATARACT EXTRACTION W/PHACO Right 12/10/2015  ? Procedure: CATARACT EXTRACTION PHACO AND INTRAOCULAR LENS PLACEMENT RIGHT EYE; CDE: 19.30;  Surgeon: Jorge Branch, MD;  Location: [facility];  Service: Ophthalmology;  Laterality: Right;  ? CATARACT EXTRACTION W/PHACO Left 01/14/2016  ? Procedure: CATARACT EXTRACTION PHACO AND INTRAOCULAR LENS PLACEMENT LEFT EYE; CDE:  8.03;  Surgeon: Jorge Branch, MD;  Location: [facility];  Service: Ophthalmology;  Laterality: Left;  ? COLONOSCOPY    ? EYE SURGERY    ? Ulcer surgery    ? repair of stomach ulcer  ? ? ?FAMILY HISTORY: ?Family History  ?Problem Relation Age of Onset  ? Heart failure Father   ? Colon polyps Sister   ? GI Bleed Sister   ? GI Bleed Brother   ? Heart attack Brother   ? Psoriasis Daughter   ? Arthritis/Rheumatoid Daughter   ? Cancer Brother   ? Heart disease Brother   ? Heart disease Brother   ? Colon cancer Neg Hx   ? Esophageal cancer Neg Hx   ? Rectal cancer Neg Hx   ? Stomach cancer Neg Hx   ? ? ?SOCIAL HISTORY: ?Social History  ? ?Socioeconomic History  ? Marital status: Divorced  ?  Spouse name: Not on file  ? Number of children: 4  ? Years of education: Some college  ? Highest education level: Some college, no degree  ?Occupational History  ? Occupation: Retired  ?  Employer: [employer]  ?Tobacco Use  ? Smoking status: Light Smoker  ?  Packs/day: 0.25  ?  Years: 45.00  ?  Pack years: 11.25  ?  Types: Cigarettes  ? Smokeless tobacco: Former  ?  Types: Chew  ?  Quit date: 06/20/1989  ? Tobacco comments:  ?  he is not interested in cessation  ?Vaping Use  ? Vaping Use: Never used  ?Substance and Sexual Activity  ? Alcohol use: Yes  ?  Alcohol/week: 6.0 standard drinks  ?  Types: 6 Cans of beer per week  ?  Comment: drings 4-6 beers per day  ? Drug use: No  ?  Types: Marijuana  ?  Comment: last used marijuana yesterday  ? Sexual activity: Not Currently  ?  Birth  control/protection: None  ?Other Topics Concern  ? Not on file  ?Social History Narrative  ? Divorced 2 sons 2 daughters  ? Is retired from [employer] logistics/support/administrative officer, he was a Building control surveyor  ? 3 alcoholic beverages daily no caffeine no drugs he still smokes no other tobacco or drug use  ? ?Social Determinants of Health  ? ?Financial Resource Strain: Koch Risk   ?  Difficulty of Paying Living Expenses: Not hard at all  ?Food Insecurity: No Food Insecurity  ? Worried About Charity fundraiser in the Last Year: Never true  ? Ran Out of Food in the Last Year: Never true  ?Transportation Needs: No Transportation Needs  ? Lack of Transportation (Medical): No  ? Lack of Transportation (Non-Medical): No  ?Physical Activity: Inactive  ? Days of Exercise per Week: 0 days  ? Minutes of Exercise per Session: 0 min  ?Stress: No Stress Concern Present  ? Feeling of Stress : Only a little  ?Social Connections: Socially Isolated  ? Frequency of Communication with Friends and Family: More than three times a week  ? Frequency of Social Gatherings with Friends and Family: More than three times a week  ? Attends Religious Services: Never  ? Active Member of Clubs or Organizations: No  ? Attends Archivist Meetings: Never  ? Marital Status: Divorced  ?Intimate Partner Violence: Not At Risk  ? Fear of Current or Ex-Partner: No  ? Emotionally Abused: No  ? Physically Abused: No  ? Sexually Abused: No  ? ? ? ? ?PHYSICAL EXAM ?Generalized: Well developed, in no acute distress  ? ?Neurological examination  ?Mentation: Alert oriented to time, place, history taking. Follows all commands speech and language fluent ?Cranial nerve II-XII:Extraocular movements were full. Facial symmetry noted.  ?DIAGNOSTIC DATA (LABS, IMAGING, TESTING) ?- I reviewed patient records, labs, notes, testing and imaging myself where available. ? ?Lab Results  ?Component Value Date  ? WBC 8.1 08/12/2021  ? HGB 11.5 (L) 08/12/2021  ? HCT 33.8 (L) 08/12/2021  ? MCV 95  08/12/2021  ? PLT 325 08/12/2021  ? ?   ?Component Value Date/Time  ? NA 130 (L) 08/12/2021 0854  ? NA 123 (L) 07/03/2014 1042  ? K 5.2 08/12/2021 0854  ? K 5.0 07/03/2014 1042  ? CL 94 (L) 08/12/2021 0854  ? CL 89 (L) 01

## 2021-10-11 ENCOUNTER — Encounter: Payer: Self-pay | Admitting: *Deleted

## 2021-10-11 NOTE — Progress Notes (Signed)
Order for mask refit/transfer of care faxed to [facility] in [place]. Received a receipt of confirmation. ? ?

## 2021-10-23 ENCOUNTER — Other Ambulatory Visit: Payer: Self-pay | Admitting: Family

## 2021-10-23 DIAGNOSIS — K219 Gastro-esophageal reflux disease without esophagitis: Secondary | ICD-10-CM

## 2021-10-25 ENCOUNTER — Ambulatory Visit (INDEPENDENT_AMBULATORY_CARE_PROVIDER_SITE_OTHER): Payer: Medicare Other

## 2021-10-25 DIAGNOSIS — D6869 Other thrombophilia: Secondary | ICD-10-CM | POA: Insufficient documentation

## 2021-10-25 DIAGNOSIS — I428 Other cardiomyopathies: Secondary | ICD-10-CM

## 2021-10-25 DIAGNOSIS — I480 Paroxysmal atrial fibrillation: Secondary | ICD-10-CM | POA: Insufficient documentation

## 2021-10-25 NOTE — Progress Notes (Signed)
?  ?Cardiology Office Note ? ? ?Date:  10/27/2021  ? ?ID:  ARTIST BLOOM, DOB 1941/03/17, MRN [id number] ? ?PCP:  Sharion Balloon, FNP  ?Cardiologist:   Minus Breeding, MD ? ? ?Chief Complaint  ?Patient presents with  ? Coronary Artery Disease  ? ? ?  ?History of Present Illness: ?[name] is a 81 y.o. male who presents for follow up of non ischemic cardiomyopathy.  Since I last saw him he has been seen for atrial fib.  Since I last saw him he is otherwise not had any problems.  He does not feel the atrial fibrillation although he spends hours at a time in his for his device interrogation.  He is now on Eliquis. The patient denies any new symptoms such as chest discomfort, neck or arm discomfort. There has been no new shortness of breath, PND or orthopnea. There have been no reported palpitations, presyncope or syncope.   He did have a fall and he is trying to be very careful about not doing this.  He is cutting down on his cigarettes.  He still drinks Natural Lites.  He does have some mild gait disturbance and joint pains. ? ? ?Past Medical History:  ?Diagnosis Date  ? Anxiety   ? Arthritis   ? CAD (coronary artery disease)   ? Nonobstructive cath 2012.  PCI to RCA 2003  ? Carotid artery disease ([place])   ? Cataract   ? CHF (congestive heart failure) ([place])   ? COPD (chronic obstructive pulmonary disease) ([place])   ? Depression   ? Dyslipidemia   ? EtOH dependence ([place])   ? GERD (gastroesophageal reflux disease)   ? Hyperlipidemia   ? Hypertension   ? Insomnia   ? Nonischemic cardiomyopathy ([place])   ? EF was 20% (60% last echo 2015)  ? Pacemaker   ? and defibrillator  ? Peptic ulcer disease   ? Tobacco abuse   ? ? ?Past Surgical History:  ?Procedure Laterality Date  ? BIV ICD GENERTAOR CHANGE OUT N/A 03/06/2014  ? [employer] Gen change by Dr Rayann Heman Beckie Salts CRT-D with LV1 header)  ? biventricular defibrillator implantation    ? Chemical engineer and pacemaker  ? CATARACT EXTRACTION W/PHACO Right  12/10/2015  ? Procedure: CATARACT EXTRACTION PHACO AND INTRAOCULAR LENS PLACEMENT RIGHT EYE; CDE: 19.30;  Surgeon: Tonny Branch, MD;  Location: [facility];  Service: Ophthalmology;  Laterality: Right;  ? CATARACT EXTRACTION W/PHACO Left 01/14/2016  ? Procedure: CATARACT EXTRACTION PHACO AND INTRAOCULAR LENS PLACEMENT LEFT EYE; CDE:  8.03;  Surgeon: Tonny Branch, MD;  Location: [facility];  Service: Ophthalmology;  Laterality: Left;  ? COLONOSCOPY    ? EYE SURGERY    ? Ulcer surgery    ? repair of stomach ulcer  ? ? ? ?Current Outpatient Medications  ?Medication Sig Dispense Refill  ? albuterol (VENTOLIN HFA) 108 (90 Base) MCG/ACT inhaler Inhale 1 puff into the lungs every 6 (six) hours as needed for shortness of breath. 8 g 2  ? ALPRAZolam (XANAX) 1 MG tablet Take 1 tablet (1 mg total) by mouth 2 (two) times daily as needed. Keep appt w/ Alyse Low for refills 60 tablet 2  ? amLODipine (NORVASC) 10 MG tablet Take 1 tablet by mouth once daily 90 tablet 1  ? apixaban (ELIQUIS) 5 MG TABS tablet Take 1 tablet (5 mg total) by mouth 2 (two) times daily. 60 tablet 3  ? cetirizine (ZYRTEC) 10 MG tablet Take 10 mg by mouth daily  as needed for allergies.    ? guaiFENesin (MUCINEX) 600 MG 12 hr tablet Take 2 tablets (1,200 mg total) by mouth 2 (two) times daily. 30 tablet 0  ? lisinopril (ZESTRIL) 40 MG tablet Take 1 tablet by mouth once daily 90 tablet 3  ? metoprolol tartrate (LOPRESSOR) 100 MG tablet Take 1 tablet by mouth twice daily 180 tablet 3  ? Multiple Vitamin (MULTIVITAMIN) capsule Take 1 capsule by mouth daily.    ? pantoprazole (PROTONIX) 40 MG tablet Take 1 tablet by mouth once daily 90 tablet 0  ? pravastatin (PRAVACHOL) 80 MG tablet Take 1 tablet (80 mg total) by mouth every evening. 90 tablet 3  ? tamsulosin (FLOMAX) 0.4 MG CAPS capsule Take 1 capsule (0.4 mg total) by mouth daily. 30 capsule 3  ? ?No current facility-administered medications for this visit.  ? ? ?Allergies:   Gabapentin, Sudafed [pseudoephedrine hcl],  Prednisone, and Avelox [moxifloxacin hcl in nacl]  ? ? ?ROS:  Please see the history of present illness.   Otherwise, review of systems are positive for neuroapthy.   All other systems are reviewed and negative.  ? ? ?PHYSICAL EXAM: ?VS:  BP (!) 146/70   Pulse (!) 59   Ht '5\' 7"'$  (1.702 m)   Wt 125 lb 12.8 oz (57.1 kg)   SpO2 98%   BMI 19.70 kg/m?  , BMI Body mass index is 19.7 kg/m?. ?GENERAL:  Well appearing ?NECK:  No jugular venous distention, waveform within normal limits, carotid upstroke brisk and symmetric, no bruits, no thyromegaly ?LUNGS:  Clear to auscultation bilaterally ?CHEST:  Well healed ICD pocket. ?HEART:  PMI not displaced or sustained,S1 and S2 within normal limits, no S3, no S4, no clicks, no rubs, no murmurs ?ABD:  Flat, positive bowel sounds normal in frequency in pitch, no bruits, no rebound, no guarding, no midline pulsatile mass, no hepatomegaly, no splenomegaly ?EXT:  2 plus pulses throughout, no edema, no cyanosis no clubbing ? ? ?EKG:  EKG is ordered today. ?The ekg ordered today demonstrates sinus rhythm, ventricular pacing 100% capture, rate 57 ? ? ?Recent Labs: ?08/12/2021: ALT 18; BUN 15; Creatinine, Ser 1.06; Hemoglobin 11.5; Platelets 325; Potassium 5.2; Sodium 130  ? ? ?Lipid Panel ?   ?Component Value Date/Time  ? CHOL 190 08/12/2021 0854  ? TRIG 54 08/12/2021 0854  ? TRIG CANCELED 01/31/2014 1618  ? HDL 86 08/12/2021 0854  ? HDL CANCELED 01/31/2014 1618  ? CHOLHDL 2.2 08/12/2021 0854  ? [place] 94 08/12/2021 0854  ? ?  ? ?Wt Readings from Last 3 Encounters:  ?10/27/21 125 lb 12.8 oz (57.1 kg)  ?08/27/21 128 lb 9.6 oz (58.3 kg)  ?08/12/21 127 lb (57.6 kg)  ?  ? ? ?Other studies Reviewed: ?Additional studies/ records that were reviewed today include: EP note, AF Clinic note from Feb. ?Review of the above records demonstrates:  Please see elsewhere in the note.   ? ? ?ASSESSMENT AND PLAN: ? ?PAF:    Mr. [name] has a CHA2DS2 - VASc score of 5.  He has been advised to  reduce his ETOH intake.     ? ?VENTRICULAR TACHYCARDIA:    He saw Dr. Rayann Heman in March.   He is up to date with ICD follow up.   ? ?CARDIOMOPATHY:  His EF was normal in 2015.  No change in therapy.  ?  ?TOBACCO ABUSE:   We did talk about this again today.  He is trying to cut back. ? ?  DYSLIPIDEMIA:   LDL is 94 in February.  I am going to increase his pravastatin to 80 mg daily and check a lipid profile in about 12 weeks.   ? ?HTN: The blood pressure is better at home he says and controlled.  He will continue on the meds as listed. ? ?ETOH:   As above. ? ?SLEEP APNEA: He is using CPAP .   ? ?Current medicines are reviewed at length with the patient today.  The patient does not have concerns regarding medicines. ? ?The following changes have been made: As above ? ?Labs/ tests ordered today include:   ? ?Orders Placed This Encounter  ?Procedures  ? Lipid panel  ? ? ? ?Disposition:   FU with me in 12 months.   ? ? ?Signed, ?Minus Breeding, MD  ?10/27/2021 11:23 AM    ?[place] ? ? ? ?

## 2021-10-26 LAB — CUP PACEART REMOTE DEVICE CHECK
Battery Remaining Longevity: 30 mo
Battery Remaining Percentage: 45 %
Brady Statistic RA Percent Paced: 0 %
Brady Statistic RV Percent Paced: 99 %
Date Time Interrogation Session: 20230508003200
HighPow Impedance: 61 Ohm
Implantable Lead Implant Date: 20040312
Implantable Lead Implant Date: 20040312
Implantable Lead Implant Date: 20040312
Implantable Lead Location: 753858
Implantable Lead Location: 753859
Implantable Lead Location: 753860
Implantable Lead Model: 158
Implantable Lead Model: 4087
Implantable Lead Model: 4513
Implantable Lead Serial Number: 129158
Implantable Lead Serial Number: 209446
Implantable Lead Serial Number: 407264
Implantable Pulse Generator Implant Date: 20150917
Lead Channel Impedance Value: 1065 Ohm
Lead Channel Impedance Value: 580 Ohm
Lead Channel Impedance Value: 594 Ohm
Lead Channel Pacing Threshold Amplitude: 0.7 V
Lead Channel Pacing Threshold Amplitude: 1.2 V
Lead Channel Pacing Threshold Amplitude: 2.3 V
Lead Channel Pacing Threshold Pulse Width: 0.4 ms
Lead Channel Pacing Threshold Pulse Width: 0.4 ms
Lead Channel Pacing Threshold Pulse Width: 0.8 ms
Lead Channel Setting Pacing Amplitude: 2 V
Lead Channel Setting Pacing Amplitude: 2.5 V
Lead Channel Setting Pacing Amplitude: 3 V
Lead Channel Setting Pacing Pulse Width: 0.4 ms
Lead Channel Setting Pacing Pulse Width: 0.8 ms
Lead Channel Setting Sensing Sensitivity: 0.6 mV
Lead Channel Setting Sensing Sensitivity: 1 mV
Pulse Gen Serial Number: 106298

## 2021-10-27 ENCOUNTER — Ambulatory Visit: Payer: Medicare Other | Admitting: Cardiology

## 2021-10-27 ENCOUNTER — Encounter: Payer: Self-pay | Admitting: Cardiology

## 2021-10-27 ENCOUNTER — Other Ambulatory Visit: Payer: Self-pay | Admitting: *Deleted

## 2021-10-27 VITALS — BP 146/70 | HR 59 | Ht 67.0 in | Wt 125.8 lb

## 2021-10-27 DIAGNOSIS — I10 Essential (primary) hypertension: Secondary | ICD-10-CM

## 2021-10-27 DIAGNOSIS — I5022 Chronic systolic (congestive) heart failure: Secondary | ICD-10-CM

## 2021-10-27 DIAGNOSIS — E785 Hyperlipidemia, unspecified: Secondary | ICD-10-CM

## 2021-10-27 DIAGNOSIS — Z79899 Other long term (current) drug therapy: Secondary | ICD-10-CM

## 2021-10-27 DIAGNOSIS — I480 Paroxysmal atrial fibrillation: Secondary | ICD-10-CM

## 2021-10-27 DIAGNOSIS — I25119 Atherosclerotic heart disease of native coronary artery with unspecified angina pectoris: Secondary | ICD-10-CM

## 2021-10-27 DIAGNOSIS — D6869 Other thrombophilia: Secondary | ICD-10-CM

## 2021-10-27 MED ORDER — PRAVASTATIN SODIUM 80 MG PO TABS: 80.0000 mg | ORAL_TABLET | Freq: Every evening | ORAL | 3 refills | Status: DC

## 2021-10-27 NOTE — Patient Instructions (Signed)
Medication Instructions:  ?Please increase your Pravastatin to 80 mg a day. ?Continue all other medications as listed. ? ?*If you need a refill on your cardiac medications before your next appointment, please call your pharmacy* ? ?Lab Work: ?Please have blood work in 3 months (Lipid panel)  This can be drawn at [facility] around 01/27/22. ? ?If you have labs (blood work) drawn today and your tests are completely normal, you will receive your results only by: ?MyChart Message (if you have MyChart) OR ?A paper copy in the mail ?If you have any lab test that is abnormal or we need to change your treatment, we will call you to review the results. ? ?Follow-Up: ?At [facility], you and your health needs are our priority.  As part of our continuing mission to provide you with exceptional heart care, we have created designated Provider Care Teams.  These Care Teams include your primary Cardiologist (physician) and Advanced Practice Providers (APPs -  Physician Assistants and Nurse Practitioners) who all work together to provide you with the care you need, when you need it. ? ?We recommend signing up for the patient portal called "MyChart".  Sign up information is provided on this After Visit Summary.  MyChart is used to connect with patients for Virtual Visits (Telemedicine).  Patients are able to view lab/test results, encounter notes, upcoming appointments, etc.  Non-urgent messages can be sent to your provider as well.   ?To learn more about what you can do with MyChart, go to [email or web address].   ? ?Your next appointment:   ?1 year(s) ? ?The format for your next appointment:   ?In Person ? ?Provider:   ?Minus Breeding, MD{ ? ? ?Important Information About Sugar ? ? ? ? ?  ?

## 2021-11-09 NOTE — Progress Notes (Signed)
Remote ICD transmission.   

## 2021-11-12 ENCOUNTER — Ambulatory Visit (INDEPENDENT_AMBULATORY_CARE_PROVIDER_SITE_OTHER): Payer: Medicare Other | Admitting: Family

## 2021-11-12 ENCOUNTER — Encounter: Payer: Self-pay | Admitting: Family

## 2021-11-12 VITALS — BP 131/65 | HR 71 | Temp 97.8°F | Ht 67.0 in | Wt 127.6 lb

## 2021-11-12 DIAGNOSIS — R3914 Feeling of incomplete bladder emptying: Secondary | ICD-10-CM

## 2021-11-12 DIAGNOSIS — F17200 Nicotine dependence, unspecified, uncomplicated: Secondary | ICD-10-CM

## 2021-11-12 DIAGNOSIS — I700 Atherosclerosis of aorta: Secondary | ICD-10-CM

## 2021-11-12 DIAGNOSIS — I480 Paroxysmal atrial fibrillation: Secondary | ICD-10-CM

## 2021-11-12 DIAGNOSIS — G4700 Insomnia, unspecified: Secondary | ICD-10-CM

## 2021-11-12 DIAGNOSIS — N401 Enlarged prostate with lower urinary tract symptoms: Secondary | ICD-10-CM

## 2021-11-12 DIAGNOSIS — Z79899 Other long term (current) drug therapy: Secondary | ICD-10-CM | POA: Diagnosis not present

## 2021-11-12 DIAGNOSIS — R350 Frequency of micturition: Secondary | ICD-10-CM

## 2021-11-12 DIAGNOSIS — F411 Generalized anxiety disorder: Secondary | ICD-10-CM | POA: Diagnosis not present

## 2021-11-12 DIAGNOSIS — K219 Gastro-esophageal reflux disease without esophagitis: Secondary | ICD-10-CM | POA: Diagnosis not present

## 2021-11-12 DIAGNOSIS — I10 Essential (primary) hypertension: Secondary | ICD-10-CM

## 2021-11-12 DIAGNOSIS — F1010 Alcohol abuse, uncomplicated: Secondary | ICD-10-CM

## 2021-11-12 DIAGNOSIS — E785 Hyperlipidemia, unspecified: Secondary | ICD-10-CM | POA: Diagnosis not present

## 2021-11-12 DIAGNOSIS — F1320 Sedative, hypnotic or anxiolytic dependence, uncomplicated: Secondary | ICD-10-CM | POA: Diagnosis not present

## 2021-11-12 DIAGNOSIS — R0681 Apnea, not elsewhere classified: Secondary | ICD-10-CM

## 2021-11-12 DIAGNOSIS — G621 Alcoholic polyneuropathy: Secondary | ICD-10-CM

## 2021-11-12 MED ORDER — TAMSULOSIN HCL 0.4 MG PO CAPS: 0.4000 mg | ORAL_CAPSULE | Freq: Every day | ORAL | 3 refills | Status: DC

## 2021-11-12 MED ORDER — ALPRAZOLAM 1 MG PO TABS: 1.0000 mg | ORAL_TABLET | Freq: Two times a day (BID) | ORAL | 2 refills | Status: DC | PRN

## 2021-11-12 NOTE — Patient Instructions (Signed)

## 2021-11-12 NOTE — Progress Notes (Signed)
Subjective:    Patient ID: [name], male    DOB: [birth year], 81 y.o.   MRN: [id number]  Chief Complaint  Patient presents with   Medical Management of Chronic Issues   Pt presents to the office today for chronic follow up. PT is followed by Cardiologists for CAD, CHF, Atherosclerosis, Cardiomyopathy, and Ventricular tachycardia every 2 years.  PT has pacemaker and is followed annually for this.     Pt is followed by Dermatologists for psoriasis as needed.     He does report drinking 4-5 beers a day. He reports constant burning and aching pain of bilateral feet from alocholic peripheral neuropathy that is 5 out 10. He has taken Lyric without relief.    He has aortic atheroscleroses and takes pravastatin daily.   He has emphysema and smokes 1/4 a pack a day. Denies any SOB. Hypertension This is a chronic problem. The problem has been resolved since onset. The problem is controlled. Associated symptoms include anxiety. Pertinent negatives include no malaise/fatigue, peripheral edema or shortness of breath. Risk factors for coronary artery disease include dyslipidemia, male gender, sedentary lifestyle and smoking/tobacco exposure. The current treatment provides moderate improvement.  Congestive Heart Failure Presents for follow-up visit. Associated symptoms include fatigue and nocturia (3). Pertinent negatives include no chest pressure, edema or shortness of breath. The symptoms have been stable.  Gastroesophageal Reflux He complains of belching, heartburn and a hoarse voice. This is a chronic problem. The current episode started more than 1 year ago. The problem occurs occasionally. The problem has been unchanged. Associated symptoms include fatigue. He has tried a PPI for the symptoms. The treatment provided moderate relief.  Insomnia Primary symptoms: difficulty falling asleep, frequent awakening, no malaise/fatigue.   The current episode started more than one year. The onset  quality is gradual. The problem occurs intermittently.  Hyperlipidemia This is a chronic problem. The current episode started more than 1 year ago. The problem is controlled. Factors aggravating his hyperlipidemia include smoking. Pertinent negatives include no shortness of breath. Current antihyperlipidemic treatment includes statins. The current treatment provides moderate improvement of lipids. Risk factors for coronary artery disease include dyslipidemia, male sex, hypertension and a sedentary lifestyle.  Anxiety Presents for follow-up visit. Symptoms include dry mouth, excessive worry, insomnia, irritability and nervous/anxious behavior. Patient reports no shortness of breath. Symptoms occur occasionally.    Benign Prostatic Hypertrophy This is a chronic problem. The current episode started more than 1 year ago. Irritative symptoms include nocturia (3). Treatments tried: pt thinks he accidently stopped the flomax.  Nicotine Dependence Presents for follow-up visit. Symptoms include fatigue, insomnia and irritability. His urge triggers include company of smokers. The symptoms have been stable. He smokes < 1/2 a pack of cigarettes per day.     Review of Systems  Constitutional:  Positive for fatigue and irritability. Negative for malaise/fatigue.  HENT:  Positive for hoarse voice.   Respiratory:  Negative for shortness of breath.   Gastrointestinal:  Positive for heartburn.  Genitourinary:  Positive for nocturia (3).  Psychiatric/Behavioral:  The patient is nervous/anxious and has insomnia.   All other systems reviewed and are negative.     Objective:   Physical Exam Vitals reviewed.  Constitutional:      General: He is not in acute distress.    Appearance: He is well-developed.  HENT:     Head: Normocephalic.     Right Ear: Tympanic membrane normal.     Left Ear: Tympanic membrane normal.  Eyes:  General:        Right eye: No discharge.        Left eye: No discharge.      Pupils: Pupils are equal, round, and reactive to light.  Neck:     Thyroid: No thyromegaly.  Cardiovascular:     Rate and Rhythm: Normal rate and regular rhythm.     Heart sounds: Normal heart sounds. No murmur heard. Pulmonary:     Effort: Pulmonary effort is normal. No respiratory distress.     Breath sounds: Normal breath sounds. No wheezing.  Abdominal:     General: Bowel sounds are normal. There is no distension.     Palpations: Abdomen is soft.     Tenderness: There is no abdominal tenderness.  Musculoskeletal:        General: No tenderness. Normal range of motion.     Cervical back: Normal range of motion and neck supple.  Skin:    General: Skin is warm and dry.     Findings: No erythema or rash.  Neurological:     Mental Status: He is alert and oriented to person, place, and time.     Cranial Nerves: No cranial nerve deficit.     Deep Tendon Reflexes: Reflexes are normal and symmetric.  Psychiatric:        Behavior: Behavior normal.        Thought Content: Thought content normal.        Judgment: Judgment normal.      BP 131/65   Pulse 71   Temp 97.8 F (36.6 C)   Ht '5\' 7"'$  (1.702 m)   Wt 127 lb 9.6 oz (57.9 kg)   SpO2 99%   BMI 19.98 kg/m      Assessment & Plan:  [name] comes in today with chief complaint of Medical Management of Chronic Issues   Diagnosis and orders addressed:  1. GAD (generalized anxiety disorder) - ALPRAZolam (XANAX) 1 MG tablet; Take 1 tablet (1 mg total) by mouth 2 (two) times daily as needed. Keep appt w/ Alyse Low for refills  Dispense: 60 tablet; Refill: 2  2. Controlled substance agreement signed - ALPRAZolam (XANAX) 1 MG tablet; Take 1 tablet (1 mg total) by mouth 2 (two) times daily as needed. Keep appt w/ Alyse Low for refills  Dispense: 60 tablet; Refill: 2  3. Benzodiazepine dependence (HCC) - ALPRAZolam (XANAX) 1 MG tablet; Take 1 tablet (1 mg total) by mouth 2 (two) times daily as needed. Keep appt w/ Alyse Low for  refills  Dispense: 60 tablet; Refill: 2  4. Essential hypertension  5. Aortic atherosclerosis ([place])  6. Thoracic aorta atherosclerosis (HCC)  7. Paroxysmal atrial fibrillation ([place])  8. GERD with apnea  9. Gastroesophageal reflux disease, unspecified whether esophagitis present  10. Alcoholic peripheral neuropathy ([place])  11. TOBACCO ABUSE  12. Alcohol abuse  13. Benign prostatic hyperplasia with urinary frequency  14. Hyperlipidemia, unspecified hyperlipidemia type  15. Insomnia, unspecified type  16. Benign prostatic hyperplasia with incomplete bladder emptying Restart flomax - tamsulosin (FLOMAX) 0.4 MG CAPS capsule; Take 1 capsule (0.4 mg total) by mouth daily.  Dispense: 90 capsule; Refill: 3    Health Maintenance reviewed Diet and exercise encouraged  Follow up plan: 3 months   Evelina Dun, FNP

## 2021-11-20 ENCOUNTER — Other Ambulatory Visit (HOSPITAL_COMMUNITY): Payer: Self-pay | Admitting: Nurse Practitioner

## 2021-11-20 ENCOUNTER — Other Ambulatory Visit: Payer: Self-pay | Admitting: Family

## 2021-11-20 DIAGNOSIS — I10 Essential (primary) hypertension: Secondary | ICD-10-CM

## 2021-12-11 ENCOUNTER — Other Ambulatory Visit: Payer: Self-pay | Admitting: Internal Medicine

## 2021-12-11 DIAGNOSIS — I10 Essential (primary) hypertension: Secondary | ICD-10-CM

## 2021-12-28 ENCOUNTER — Ambulatory Visit: Payer: Self-pay | Admitting: *Deleted

## 2021-12-28 NOTE — Chronic Care Management (AMB) (Signed)
  Chronic Care Management   Note  12/28/2021 Name: [name] MRN: [id number] DOB: [birth year]   Patient has not recently engaged with the Chronic Care Management RN Care Manager. Removing RN Care Manager from Care Team and closing [place]. If patient is currently engaged with another CCM team member I will forward this encounter to inform them of my case closure. Patient may be eligible for re-engagement with RN Care Manager in the future if necessary and can discuss this with their PCP.  Chong Sicilian, BSN, RN-BC Embedded Chronic Care Manager [facility] [place] [facility] Direct Dial: [phone]

## 2022-01-03 ENCOUNTER — Ambulatory Visit (INDEPENDENT_AMBULATORY_CARE_PROVIDER_SITE_OTHER): Payer: Medicare Other | Admitting: Family

## 2022-01-03 ENCOUNTER — Encounter: Payer: Self-pay | Admitting: Family

## 2022-01-03 VITALS — BP 114/62 | HR 69 | Temp 97.2°F | Ht 67.0 in | Wt 133.8 lb

## 2022-01-03 DIAGNOSIS — I10 Essential (primary) hypertension: Secondary | ICD-10-CM

## 2022-01-03 DIAGNOSIS — R609 Edema, unspecified: Secondary | ICD-10-CM

## 2022-01-03 DIAGNOSIS — I5022 Chronic systolic (congestive) heart failure: Secondary | ICD-10-CM | POA: Diagnosis not present

## 2022-01-03 MED ORDER — FUROSEMIDE 20 MG PO TABS
20.0000 mg | ORAL_TABLET | Freq: Every day | ORAL | 0 refills | Status: DC | PRN
Start: 2022-01-03 — End: 2022-01-24

## 2022-01-03 NOTE — Patient Instructions (Signed)
Peripheral Edema  Peripheral edema is swelling that is caused by a buildup of fluid. Peripheral edema most often affects the lower legs, ankles, and feet. It can also develop in the arms, hands, and face. The area of the body that has peripheral edema will look swollen. It may also feel heavy or warm. Your clothes may start to feel tight. Pressing on the area may make a temporary dent in your skin (pitting edema). You may not be able to move your swollen arm or leg as much as usual. There are many causes of peripheral edema. It can happen because of a complication of other conditions such as heart failure, kidney disease, or a problem with your circulation. It also can be a side effect of certain medicines or happen because of an infection. It often happens to women during pregnancy. Sometimes, the cause is not known. Follow these instructions at home: Managing pain, stiffness, and swelling  Raise (elevate) your legs while you are sitting or lying down. Move around often to prevent stiffness and to reduce swelling. Do not sit or stand for long periods of time. Do not wear tight clothing. Do not wear garters on your upper legs. Exercise your legs to get your circulation going. This helps to move the fluid back into your blood vessels, and it may help the swelling go down. Wear compression stockings as told by your health care provider. These stockings help to prevent blood clots and reduce swelling in your legs. It is important that these are the correct size. These stockings should be prescribed by your doctor to prevent possible injuries. If elastic bandages or wraps are recommended, use them as told by your health care provider. Medicines Take over-the-counter and prescription medicines only as told by your health care provider. Your health care provider may prescribe medicine to help your body get rid of excess water (diuretic). Take this medicine if you are told to take it. General  instructions Eat a low-salt (low-sodium) diet as told by your health care provider. Sometimes, eating less salt may reduce swelling. Pay attention to any changes in your symptoms. Moisturize your skin daily to help prevent skin from cracking and draining. Keep all follow-up visits. This is important. Contact a health care provider if: You have a fever. You have swelling in only one leg. You have increased swelling, redness, or pain in one or both of your legs. You have drainage or sores at the area where you have edema. Get help right away if: You have edema that starts suddenly or is getting worse, especially if you are pregnant or have a medical condition. You develop shortness of breath, especially when you are lying down. You have pain in your chest or abdomen. You feel weak. You feel like you will faint. These symptoms may be an emergency. Get help right away. Call 911. Do not wait to see if the symptoms will go away. Do not drive yourself to the hospital. Summary Peripheral edema is swelling that is caused by a buildup of fluid. Peripheral edema most often affects the lower legs, ankles, and feet. Move around often to prevent stiffness and to reduce swelling. Do not sit or stand for long periods of time. Pay attention to any changes in your symptoms. Contact a health care provider if you have edema that starts suddenly or is getting worse, especially if you are pregnant or have a medical condition. Get help right away if you develop shortness of breath, especially when lying down.   This information is not intended to replace advice given to you by your health care provider. Make sure you discuss any questions you have with your health care provider. Document Revised: 02/08/2021 Document Reviewed: 02/08/2021 Elsevier Patient Education  2023 [employer] Inc.  

## 2022-01-03 NOTE — Progress Notes (Signed)
Subjective:    Patient ID: [name], male    DOB: 11-25-1940, 81 y.o.   MRN: [id number]  Chief Complaint  Patient presents with   Edema   Pt presents to the office today with bilateral edema in legs that started several weeks ago. States he has noticed a line from his socks. Denies any increased salt or changes in medications.   He is 6 lbs heavier from previous visit.  Hypertension This is a chronic problem. The current episode started more than 1 year ago. The problem has been resolved since onset. The problem is controlled. Associated symptoms include peripheral edema and shortness of breath (at times). Pertinent negatives include no malaise/fatigue. Risk factors for coronary artery disease include dyslipidemia, obesity and sedentary lifestyle. Hypertensive end-organ damage includes heart failure.  Congestive Heart Failure Presents for follow-up visit. Associated symptoms include edema, fatigue and shortness of breath (at times). The symptoms have been stable.      Review of Systems  Constitutional:  Positive for fatigue. Negative for malaise/fatigue.  Respiratory:  Positive for shortness of breath (at times).   All other systems reviewed and are negative.      Objective:   Physical Exam Vitals reviewed.  Constitutional:      General: He is not in acute distress.    Appearance: He is well-developed.  HENT:     Head: Normocephalic.     Right Ear: Tympanic membrane normal.     Left Ear: Tympanic membrane normal.  Eyes:     General:        Right eye: No discharge.        Left eye: No discharge.     Pupils: Pupils are equal, round, and reactive to light.  Neck:     Thyroid: No thyromegaly.  Cardiovascular:     Rate and Rhythm: Normal rate and regular rhythm.     Heart sounds: Normal heart sounds. No murmur heard. Pulmonary:     Effort: Pulmonary effort is normal. No respiratory distress.     Breath sounds: Normal breath sounds. No wheezing.  Abdominal:      General: Bowel sounds are normal. There is no distension.     Palpations: Abdomen is soft.     Tenderness: There is no abdominal tenderness.  Musculoskeletal:        General: No tenderness. Normal range of motion.     Cervical back: Normal range of motion and neck supple.     Right lower leg: Edema (2+) present.     Left lower leg: Edema (2+) present.  Skin:    General: Skin is warm and dry.     Findings: No erythema or rash.  Neurological:     Mental Status: He is alert and oriented to person, place, and time.     Cranial Nerves: No cranial nerve deficit.     Deep Tendon Reflexes: Reflexes are normal and symmetric.  Psychiatric:        Behavior: Behavior normal.        Thought Content: Thought content normal.        Judgment: Judgment normal.       BP 114/62   Pulse 69   Temp (!) 97.2 F (36.2 C)   Ht 5' 7" (1.702 m)   Wt 133 lb 12.8 oz (60.7 kg)   SpO2 99%   BMI 20.96 kg/m      Assessment & Plan:  [name] comes in today with chief complaint of Edema  Diagnosis and orders addressed:  1. Chronic systolic heart failure (HCC) - Compression stockings - furosemide (LASIX) 20 MG tablet; Take 1 tablet (20 mg total) by mouth daily as needed.  Dispense: 30 tablet; Refill: 0 - Brain natriuretic peptide - BMP8+EGFR  2. Essential hypertension - Compression stockings - Brain natriuretic peptide - BMP8+EGFR  3. Peripheral edema - Compression stockings - furosemide (LASIX) 20 MG tablet; Take 1 tablet (20 mg total) by mouth daily as needed.  Dispense: 30 tablet; Refill: 0 - Brain natriuretic peptide - BMP8+EGFR  Low salt diet Compression hose Keep elevated Lasix take for 3 days, then as needed  Labs pending Health Maintenance reviewed Diet and exercise encouraged  Follow up plan: Keep chronic follow up   Evelina Dun, FNP

## 2022-01-04 LAB — BMP8+EGFR
BUN/Creatinine Ratio: 15 (ref 10–24)
BUN: 15 mg/dL (ref 8–27)
CO2: 23 mmol/L (ref 20–29)
Calcium: 9.2 mg/dL (ref 8.6–10.2)
Chloride: 94 mmol/L — ABNORMAL LOW (ref 96–106)
Creatinine, Ser: 1.03 mg/dL (ref 0.76–1.27)
Glucose: 88 mg/dL (ref 70–99)
Potassium: 4.9 mmol/L (ref 3.5–5.2)
Sodium: 130 mmol/L — ABNORMAL LOW (ref 134–144)
eGFR: 74 mL/min/{1.73_m2} (ref >59)

## 2022-01-04 LAB — BRAIN NATRIURETIC PEPTIDE: BNP: 393.5 pg/mL — ABNORMAL HIGH (ref 0.0–100.0)

## 2022-01-11 DIAGNOSIS — R063 Periodic breathing: Secondary | ICD-10-CM | POA: Diagnosis not present

## 2022-01-11 DIAGNOSIS — G4737 Central sleep apnea in conditions classified elsewhere: Secondary | ICD-10-CM | POA: Diagnosis not present

## 2022-01-20 ENCOUNTER — Other Ambulatory Visit: Payer: Self-pay | Admitting: Family

## 2022-01-20 DIAGNOSIS — K219 Gastro-esophageal reflux disease without esophagitis: Secondary | ICD-10-CM

## 2022-01-24 ENCOUNTER — Ambulatory Visit (INDEPENDENT_AMBULATORY_CARE_PROVIDER_SITE_OTHER): Payer: Medicare Other

## 2022-01-24 ENCOUNTER — Other Ambulatory Visit: Payer: Self-pay | Admitting: Family

## 2022-01-24 DIAGNOSIS — I5022 Chronic systolic (congestive) heart failure: Secondary | ICD-10-CM

## 2022-01-24 DIAGNOSIS — R609 Edema, unspecified: Secondary | ICD-10-CM

## 2022-01-24 DIAGNOSIS — R600 Localized edema: Secondary | ICD-10-CM

## 2022-01-25 LAB — CUP PACEART REMOTE DEVICE CHECK
Battery Remaining Longevity: 24 mo
Battery Remaining Percentage: 35 %
Brady Statistic RA Percent Paced: 1 %
Brady Statistic RV Percent Paced: 99 %
Date Time Interrogation Session: 20230807013500
HighPow Impedance: 52 Ohm
Implantable Lead Implant Date: 20040312
Implantable Lead Implant Date: 20040312
Implantable Lead Implant Date: 20040312
Implantable Lead Location: 753858
Implantable Lead Location: 753859
Implantable Lead Location: 753860
Implantable Lead Model: 158
Implantable Lead Model: 4087
Implantable Lead Model: 4513
Implantable Lead Serial Number: 129158
Implantable Lead Serial Number: 209446
Implantable Lead Serial Number: 407264
Implantable Pulse Generator Implant Date: 20150917
Lead Channel Impedance Value: 1063 Ohm
Lead Channel Impedance Value: 543 Ohm
Lead Channel Impedance Value: 558 Ohm
Lead Channel Pacing Threshold Amplitude: 0.7 V
Lead Channel Pacing Threshold Amplitude: 1.2 V
Lead Channel Pacing Threshold Amplitude: 2.3 V
Lead Channel Pacing Threshold Pulse Width: 0.4 ms
Lead Channel Pacing Threshold Pulse Width: 0.4 ms
Lead Channel Pacing Threshold Pulse Width: 0.8 ms
Lead Channel Setting Pacing Amplitude: 2 V
Lead Channel Setting Pacing Amplitude: 2.5 V
Lead Channel Setting Pacing Amplitude: 3 V
Lead Channel Setting Pacing Pulse Width: 0.4 ms
Lead Channel Setting Pacing Pulse Width: 0.8 ms
Lead Channel Setting Sensing Sensitivity: 0.6 mV
Lead Channel Setting Sensing Sensitivity: 1 mV
Pulse Gen Serial Number: 106298

## 2022-01-27 ENCOUNTER — Ambulatory Visit (INDEPENDENT_AMBULATORY_CARE_PROVIDER_SITE_OTHER): Payer: Medicare Other | Admitting: Family

## 2022-01-27 ENCOUNTER — Encounter: Payer: Self-pay | Admitting: Family

## 2022-01-27 VITALS — BP 135/72 | HR 70 | Temp 96.7°F | Ht 66.0 in | Wt 134.8 lb

## 2022-01-27 DIAGNOSIS — I5022 Chronic systolic (congestive) heart failure: Secondary | ICD-10-CM

## 2022-01-27 DIAGNOSIS — J432 Centrilobular emphysema: Secondary | ICD-10-CM | POA: Diagnosis not present

## 2022-01-27 DIAGNOSIS — J441 Chronic obstructive pulmonary disease with (acute) exacerbation: Secondary | ICD-10-CM

## 2022-01-27 DIAGNOSIS — R609 Edema, unspecified: Secondary | ICD-10-CM | POA: Diagnosis not present

## 2022-01-27 MED ORDER — ALBUTEROL SULFATE HFA 108 (90 BASE) MCG/ACT IN AERS: 1.0000 | INHALATION_SPRAY | Freq: Four times a day (QID) | RESPIRATORY_TRACT | 2 refills | Status: DC | PRN

## 2022-01-27 MED ORDER — DOXYCYCLINE HYCLATE 100 MG PO TABS: 100.0000 mg | ORAL_TABLET | Freq: Two times a day (BID) | ORAL | 0 refills | Status: DC

## 2022-01-27 MED ORDER — TRELEGY ELLIPTA 100-62.5-25 MCG/ACT IN AEPB: 1.0000 | INHALATION_SPRAY | Freq: Every day | RESPIRATORY_TRACT | 11 refills | Status: DC

## 2022-01-27 MED ORDER — PREDNISONE 10 MG (21) PO TBPK
ORAL_TABLET | ORAL | 0 refills | Status: DC
Start: 2022-01-27 — End: 2022-02-14

## 2022-01-27 MED ORDER — FUROSEMIDE 20 MG PO TABS: ORAL_TABLET | ORAL | 1 refills | Status: DC

## 2022-01-27 MED ORDER — PREDNISONE 10 MG PO TABS
ORAL_TABLET | ORAL | 0 refills | Status: DC
Start: 2022-01-27 — End: 2022-01-27

## 2022-01-27 NOTE — Patient Instructions (Signed)

## 2022-01-27 NOTE — Progress Notes (Signed)
Subjective:    Patient ID: [name], male    DOB: May 18, 1941, 81 y.o.   MRN: [id number]  Chief Complaint  Patient presents with   Edema    In legs off and on about 6lbs gained in about 4 days    Cough    About 3 weeks    PT presets to the office today with bilateral leg edema that started several weeks ago. He is taking Lasix 20 mg daily. Reports SOB and coughing.      01/27/2022    9:52 AM 01/03/2022   11:30 AM 11/12/2021    8:24 AM  Last 3 Weights  Weight (lbs) 134 lb 12.8 oz 133 lb 12.8 oz 127 lb 9.6 oz  Weight (kg) 61.145 kg 60.691 kg 57.879 kg     Cough This is a new problem. The current episode started in the past 7 days. The problem has been gradually worsening. The problem occurs every few minutes. The cough is Non-productive. Associated symptoms include shortness of breath and wheezing. Pertinent negatives include no chills, ear congestion, ear pain, fever, headaches, nasal congestion or postnasal drip. Risk factors for lung disease include smoking/tobacco exposure. He has tried rest for the symptoms. The treatment provided mild relief.  Nicotine Dependence Presents for follow-up visit. His urge triggers include company of smokers. The symptoms have been stable. He smokes < 1/2 a pack of cigarettes per day.      Review of Systems  Constitutional:  Negative for chills and fever.  HENT:  Negative for ear pain and postnasal drip.   Respiratory:  Positive for cough, shortness of breath and wheezing.   Neurological:  Negative for headaches.  All other systems reviewed and are negative.      Objective:   Physical Exam Vitals reviewed.  Constitutional:      General: He is not in acute distress.    Appearance: He is well-developed.  HENT:     Head: Normocephalic.     Right Ear: Tympanic membrane normal.     Left Ear: Tympanic membrane normal.  Eyes:     General:        Right eye: No discharge.        Left eye: No discharge.     Pupils: Pupils are equal,  round, and reactive to light.  Neck:     Thyroid: No thyromegaly.  Cardiovascular:     Rate and Rhythm: Normal rate and regular rhythm.     Heart sounds: Normal heart sounds. No murmur heard. Pulmonary:     Effort: Pulmonary effort is normal. No respiratory distress.     Breath sounds: Rhonchi present. No wheezing.  Abdominal:     General: Bowel sounds are normal. There is no distension.     Palpations: Abdomen is soft.     Tenderness: There is no abdominal tenderness.  Musculoskeletal:        General: No tenderness. Normal range of motion.     Cervical back: Normal range of motion and neck supple.     Right lower leg: Edema (2+) present.     Left lower leg: Edema (2+) present.  Skin:    General: Skin is warm and dry.     Findings: No erythema or rash.  Neurological:     Mental Status: He is alert and oriented to person, place, and time.     Cranial Nerves: No cranial nerve deficit.     Deep Tendon Reflexes: Reflexes are normal and symmetric.  Psychiatric:        Behavior: Behavior normal.        Thought Content: Thought content normal.        Judgment: Judgment normal.       BP 135/72   Pulse 70   Temp (!) 96.7 F (35.9 C) (Temporal)   Ht 5' 6" (1.676 m)   Wt 134 lb 12.8 oz (61.1 kg)   BMI 21.76 kg/m      Assessment & Plan:  [name] comes in today with chief complaint of Edema (In legs off and on about 6lbs gained in about 4 days ) and Cough (About 3 weeks )   Diagnosis and orders addressed:  1. Chronic systolic heart failure (HCC) Increase lasix to 40 mg for 5 days then decrease to 20 mg  - furosemide (LASIX) 20 MG tablet; 40 mg daily for next 5 days, then decrease to 20 mg daily  Dispense: 95 tablet; Refill: 1 - BMP8+EGFR - Brain natriuretic peptide - CBC with Differential/Platelet  2. Centrilobular emphysema ([place]) Start Trelegy Doxycyline  Prednisone  - Fluticasone-Umeclidin-Vilant (TRELEGY ELLIPTA) 100-62.5-25 MCG/ACT AEPB; Inhale 1 puff into  the lungs daily.  Dispense: 1 each; Refill: 11 - doxycycline (VIBRA-TABS) 100 MG tablet; Take 1 tablet (100 mg total) by mouth 2 (two) times daily.  Dispense: 20 tablet; Refill: 0 - BMP8+EGFR - Brain natriuretic peptide - predniSONE (STERAPRED UNI-PAK 21 TAB) 10 MG (21) TBPK tablet; Use as directed  Dispense: 21 tablet; Refill: 0 - CBC with Differential/Platelet  3. COPD exacerbation (HCC) Smoking cessation  - doxycycline (VIBRA-TABS) 100 MG tablet; Take 1 tablet (100 mg total) by mouth 2 (two) times daily.  Dispense: 20 tablet; Refill: 0 - BMP8+EGFR - predniSONE (STERAPRED UNI-PAK 21 TAB) 10 MG (21) TBPK tablet; Use as directed  Dispense: 21 tablet; Refill: 0 - CBC with Differential/Platelet  4. Peripheral edema - furosemide (LASIX) 20 MG tablet; 40 mg daily for next 5 days, then decrease to 20 mg daily  Dispense: 95 tablet; Refill: 1 - BMP8+EGFR - Brain natriuretic peptide - CBC with Differential/Platelet   Labs pending Health Maintenance reviewed Diet and exercise encouraged  Follow up plan: 1 week    Evelina Dun, FNP

## 2022-01-28 ENCOUNTER — Other Ambulatory Visit: Payer: Self-pay | Admitting: Family

## 2022-01-28 LAB — CBC WITH DIFFERENTIAL/PLATELET
Basophils Absolute: 0.1 10*3/uL (ref 0.0–0.2)
Basos: 1 %
EOS (ABSOLUTE): 0.3 10*3/uL (ref 0.0–0.4)
Eos: 5 %
Hematocrit: 31.1 % — ABNORMAL LOW (ref 37.5–51.0)
Hemoglobin: 10.4 g/dL — ABNORMAL LOW (ref 13.0–17.7)
Immature Grans (Abs): 0 10*3/uL (ref 0.0–0.1)
Immature Granulocytes: 0 %
Lymphocytes Absolute: 1.4 10*3/uL (ref 0.7–3.1)
Lymphs: 21 %
MCH: 31 pg (ref 26.6–33.0)
MCHC: 33.4 g/dL (ref 31.5–35.7)
MCV: 93 fL (ref 79–97)
Monocytes Absolute: 1.1 10*3/uL — ABNORMAL HIGH (ref 0.1–0.9)
Monocytes: 16 %
Neutrophils Absolute: 3.8 10*3/uL (ref 1.4–7.0)
Neutrophils: 57 %
Platelets: 339 10*3/uL (ref 150–450)
RBC: 3.35 x10E6/uL — ABNORMAL LOW (ref 4.14–5.80)
RDW: 12.4 % (ref 11.6–15.4)
WBC: 6.8 10*3/uL (ref 3.4–10.8)

## 2022-01-28 LAB — BMP8+EGFR
BUN/Creatinine Ratio: 15 (ref 10–24)
BUN: 17 mg/dL (ref 8–27)
CO2: 22 mmol/L (ref 20–29)
Calcium: 9.4 mg/dL (ref 8.6–10.2)
Chloride: 90 mmol/L — ABNORMAL LOW (ref 96–106)
Creatinine, Ser: 1.14 mg/dL (ref 0.76–1.27)
Glucose: 87 mg/dL (ref 70–99)
Potassium: 4.9 mmol/L (ref 3.5–5.2)
Sodium: 126 mmol/L — ABNORMAL LOW (ref 134–144)
eGFR: 66 mL/min/{1.73_m2} (ref >59)

## 2022-01-28 LAB — BRAIN NATRIURETIC PEPTIDE: BNP: 403.8 pg/mL — ABNORMAL HIGH (ref 0.0–100.0)

## 2022-01-28 MED ORDER — SODIUM CHLORIDE 1 G PO TABS: 1.0000 g | ORAL_TABLET | Freq: Three times a day (TID) | ORAL | 2 refills | Status: DC

## 2022-02-01 LAB — B12 AND FOLATE PANEL
Folate: >20 ng/mL (ref >3.0)
Vitamin B-12: 1366 pg/mL — ABNORMAL HIGH (ref 232–1245)

## 2022-02-01 LAB — IRON AND TIBC
Iron Saturation: 18 % (ref 15–55)
Iron: 74 ug/dL (ref 38–169)
Total Iron Binding Capacity: 413 ug/dL (ref 250–450)
UIBC: 339 ug/dL (ref 111–343)

## 2022-02-01 LAB — SPECIMEN STATUS REPORT

## 2022-02-04 ENCOUNTER — Encounter: Payer: Self-pay | Admitting: Family

## 2022-02-04 ENCOUNTER — Ambulatory Visit (INDEPENDENT_AMBULATORY_CARE_PROVIDER_SITE_OTHER): Payer: Medicare Other | Admitting: Family

## 2022-02-04 VITALS — BP 137/71 | HR 70 | Temp 97.0°F | Ht 66.0 in | Wt 131.4 lb

## 2022-02-04 DIAGNOSIS — F17200 Nicotine dependence, unspecified, uncomplicated: Secondary | ICD-10-CM | POA: Diagnosis not present

## 2022-02-04 DIAGNOSIS — I10 Essential (primary) hypertension: Secondary | ICD-10-CM | POA: Diagnosis not present

## 2022-02-04 DIAGNOSIS — J432 Centrilobular emphysema: Secondary | ICD-10-CM | POA: Diagnosis not present

## 2022-02-04 DIAGNOSIS — I5022 Chronic systolic (congestive) heart failure: Secondary | ICD-10-CM | POA: Diagnosis not present

## 2022-02-04 NOTE — Patient Instructions (Signed)
Heart Failure, Diagnosis  Heart failure is a condition in which the heart has trouble pumping blood. This may mean that the heart cannot pump enough blood out to the body or that the heart does not fill up with enough blood. For some people with heart failure, fluid may back up into the lungs. There may also be swelling (edema) in the lower legs. Heart failure is usually a long-term (chronic) condition. It is important for you to take good care of yourself and follow the treatment plan from your health care provider. Different stages of heart failure have different treatment plans. The stages are: Stage A: At risk for heart failure. Having no symptoms of heart failure, but being at risk for developing heart failure. Stage B: Pre-heart failure. Having no symptoms of heart failure, but having structural changes to the heart that indicate heart failure. Stage C: Symptomatic heart failure. Having symptoms of heart failure in addition to structural changes to the heart that indicate heart failure. Stage D: Advanced heart failure. Having symptoms that interfere with daily life and frequent hospitalizations related to heart failure. What are the causes? This condition may be caused by: High blood pressure (hypertension). Hypertension causes the heart muscle to work harder than normal. Coronary artery disease, or CAD. CAD is the buildup of cholesterol and fat (plaque) in the arteries of the heart. Heart attack, also called myocardial infarction. This injures the heart muscle, making it hard for the heart to pump blood. Abnormal heart valves. The valves do not open and close properly, forcing the heart to pump harder to keep the blood flowing. Heart muscle disease, inflammation, or infection (cardiomyopathy or myocarditis). This is damage to the heart muscle. It can increase the risk of heart failure. Lung disease. The heart works harder when the lungs are not healthy. What increases the risk? The risk  of heart failure increases as a person ages. This condition is also more likely to develop in people who: Are obese. Use tobacco or nicotine products. Abuse alcohol or drugs. Have taken medicines that can damage the heart, such as chemotherapy drugs. Have any of these conditions: Diabetes. Abnormal heart rhythms. Thyroid problems. Low blood counts (anemia). Chronic kidney disease. Have a family history of heart failure. What are the signs or symptoms? Symptoms of this condition include: Shortness of breath with activity, such as when climbing stairs. A cough that does not go away. Swelling of the feet, ankles, legs, or abdomen. Losing or gaining weight for no reason. Trouble breathing when lying flat. Waking from sleep because of the need to sit up and get more air. Rapid heartbeat. Other symptoms may include: Tiredness (fatigue) and loss of energy. Feeling light-headed, dizzy, or close to fainting. Nausea or loss of appetite. Waking up more often during the night to urinate (nocturia). Confusion. How is this diagnosed? This condition is diagnosed based on: Your medical history, symptoms, and a physical exam. Blood tests. Diagnostic tests, which may include: Echocardiogram. Electrocardiogram (ECG). Chest X-ray. Exercise stress test. Cardiac MRI. Cardiac catheterization and angiogram. Radionuclide scans. How is this treated? Treatment for this condition is aimed at managing the symptoms of heart failure. Medicines Treatment may include medicines that: Help lower blood pressure by relaxing (dilating) the blood vessels. These medicines are called ACE inhibitors (angiotensin-converting enzyme), ARBs (angiotensin receptor blockers), or vasodilators. Cause the kidneys to remove salt and water from the blood through urination (diuretics). Improve heart muscle strength and prevent the heart from beating too fast (beta blockers). Increase the   force of the heartbeat  (digoxin). Lower heart rates. Certain diabetes medicines (SGLT-2 inhibitors) may also be used in treatment. Healthy behavior changes Treatment may also include making healthy lifestyle changes, such as: Reaching and staying at a healthy weight. Not using tobacco or nicotine products. Eating heart-healthy foods. Limiting or avoiding alcohol. Stopping the use of illegal drugs. Being physically active. Participating in a cardiac rehabilitation program, which is a treatment program to improve your health and well-being through exercise training, education, and counseling. Other treatments Other treatments may include: Procedures to open blocked arteries or repair damaged valves. Placing a pacemaker to improve heart function (cardiac resynchronization therapy). Placing a device to treat serious abnormal heart rhythms (implantable cardioverter defibrillator, or ICD). Placing a device to improve the pumping ability of the heart (left ventricular assist device, or LVAD). Receiving a healthy heart from a donor (heart transplant). This is done when other treatments have not helped. Follow these instructions at home: Manage other health conditions as told by your health care provider. These may include hypertension, diabetes, thyroid disease, or abnormal heart rhythms. Get ongoing education and support as needed. Learn as much as you can about heart failure. Keep all follow-up visits. This is important. Where to find more information American Heart Association: www.heart.org Centers for Disease Control and Prevention: www.cdc.gov NIH National Institute on Aging: www.nia.nih.gov Summary Heart failure is a condition in which the heart has trouble pumping blood. This condition is commonly caused by high blood pressure and other diseases of the heart and lungs. Symptoms of this condition include shortness of breath, tiredness (fatigue), nausea, and swelling of the feet, ankles, legs, or  abdomen. Treatments for this condition may include medicines, lifestyle changes, and surgery. Manage other health conditions as told by your health care provider. This information is not intended to replace advice given to you by your health care provider. Make sure you discuss any questions you have with your health care provider. Document Revised: 09/14/2021 Document Reviewed: 12/28/2019 Elsevier Patient Education  2023 Elsevier Inc.  

## 2022-02-04 NOTE — Progress Notes (Addendum)
Subjective:    Patient ID: [name], male    DOB: 28-Jan-1941, 81 y.o.   MRN: [id number]  Chief Complaint  Patient presents with   Follow-up    SOB is getting better but still has     Pt presents to the office today to follow up on SOB. He has hx of COPD, CHF, smoker, and hypertension.  He was seen on 01/27/22 and given doxycycline, prednisone, and increased Lasix to 40 mg for 5 days then resumed 20 mg daily. Also started him on Trelegy inhaler daily.   Reports his SOB is greatly improved. He has lost 3 lb since our last visit.      02/04/2022    8:54 AM 01/27/2022    9:52 AM 01/03/2022   11:30 AM  Last 3 Weights  Weight (lbs) 131 lb 6.4 oz 134 lb 12.8 oz 133 lb 12.8 oz  Weight (kg) 59.603 kg 61.145 kg 60.691 kg     Shortness of Breath This is a chronic problem. Pertinent negatives include no chest pain.  Congestive Heart Failure Presents for follow-up visit. Associated symptoms include edema and shortness of breath. Pertinent negatives include no chest pain or fatigue. The symptoms have been stable.  Nicotine Dependence Presents for follow-up visit. Symptoms are negative for fatigue. His urge triggers include company of smokers. He smokes < 1/2 a pack of cigarettes per day.      Review of Systems  Constitutional:  Negative for fatigue.  Respiratory:  Positive for shortness of breath.   Cardiovascular:  Negative for chest pain.  All other systems reviewed and are negative.      Objective:   Physical Exam Vitals reviewed.  Constitutional:      General: He is not in acute distress.    Appearance: He is well-developed.  HENT:     Head: Normocephalic.     Right Ear: Tympanic membrane normal.     Left Ear: Tympanic membrane normal.  Eyes:     General:        Right eye: No discharge.        Left eye: No discharge.     Pupils: Pupils are equal, round, and reactive to light.  Neck:     Thyroid: No thyromegaly.  Cardiovascular:     Rate and Rhythm: Normal  rate and regular rhythm.     Heart sounds: Normal heart sounds. No murmur heard. Pulmonary:     Effort: Pulmonary effort is normal. No respiratory distress.     Breath sounds: Normal breath sounds. No wheezing.  Abdominal:     General: Bowel sounds are normal. There is no distension.     Palpations: Abdomen is soft.     Tenderness: There is no abdominal tenderness.  Musculoskeletal:        General: No tenderness. Normal range of motion.     Cervical back: Normal range of motion and neck supple.     Right lower leg: Edema (trace) present.     Left lower leg: Edema (trace) present.  Skin:    General: Skin is warm and dry.     Findings: No erythema or rash.  Neurological:     Mental Status: He is alert and oriented to person, place, and time.     Cranial Nerves: No cranial nerve deficit.     Deep Tendon Reflexes: Reflexes are normal and symmetric.  Psychiatric:        Behavior: Behavior normal.  Thought Content: Thought content normal.        Judgment: Judgment normal.       BP 137/71   Pulse 70   Temp (!) 97 F (36.1 C) (Temporal)   Ht '5\' 6"'  (1.676 m)   Wt 131 lb 6.4 oz (59.6 kg)   SpO2 99%   BMI 21.21 kg/m      Assessment & Plan:   [name] comes in today with chief complaint of Follow-up (SOB is getting better but still has )   Diagnosis and orders addressed:  1. Chronic systolic heart failure (HCC) - CMP14+EGFR  2. Essential hypertension - CMP14+EGFR  3. Centrilobular emphysema ([place]) - CMP14+EGFR  4. TOBACCO ABUSE - CMP14+EGFR   Labs pending Health Maintenance reviewed Diet and exercise encouraged  Follow up plan: Keep chronic follow up  Evelina Dun, FNP

## 2022-02-05 LAB — CMP14+EGFR
ALT: 19 IU/L (ref 0–44)
AST: 27 IU/L (ref 0–40)
Albumin/Globulin Ratio: 1.6 (ref 1.2–2.2)
Albumin: 4.6 g/dL (ref 3.8–4.8)
Alkaline Phosphatase: 89 IU/L (ref 44–121)
BUN/Creatinine Ratio: 14 (ref 10–24)
BUN: 15 mg/dL (ref 8–27)
Bilirubin Total: 0.4 mg/dL (ref 0.0–1.2)
CO2: 21 mmol/L (ref 20–29)
Calcium: 9.7 mg/dL (ref 8.6–10.2)
Chloride: 90 mmol/L — ABNORMAL LOW (ref 96–106)
Creatinine, Ser: 1.09 mg/dL (ref 0.76–1.27)
Globulin, Total: 2.9 g/dL (ref 1.5–4.5)
Glucose: 113 mg/dL — ABNORMAL HIGH (ref 70–99)
Potassium: 4.3 mmol/L (ref 3.5–5.2)
Sodium: 127 mmol/L — ABNORMAL LOW (ref 134–144)
Total Protein: 7.5 g/dL (ref 6.0–8.5)
eGFR: 69 mL/min/{1.73_m2} (ref >59)

## 2022-02-08 DIAGNOSIS — G4733 Obstructive sleep apnea (adult) (pediatric): Secondary | ICD-10-CM | POA: Diagnosis not present

## 2022-02-14 ENCOUNTER — Ambulatory Visit (INDEPENDENT_AMBULATORY_CARE_PROVIDER_SITE_OTHER): Payer: Medicare Other | Admitting: Family

## 2022-02-14 ENCOUNTER — Encounter: Payer: Self-pay | Admitting: Family

## 2022-02-14 VITALS — BP 133/72 | HR 69 | Temp 97.2°F | Ht 66.0 in | Wt 130.2 lb

## 2022-02-14 DIAGNOSIS — E785 Hyperlipidemia, unspecified: Secondary | ICD-10-CM

## 2022-02-14 DIAGNOSIS — I700 Atherosclerosis of aorta: Secondary | ICD-10-CM | POA: Diagnosis not present

## 2022-02-14 DIAGNOSIS — Z79899 Other long term (current) drug therapy: Secondary | ICD-10-CM

## 2022-02-14 DIAGNOSIS — I5022 Chronic systolic (congestive) heart failure: Secondary | ICD-10-CM

## 2022-02-14 DIAGNOSIS — I10 Essential (primary) hypertension: Secondary | ICD-10-CM

## 2022-02-14 DIAGNOSIS — F1010 Alcohol abuse, uncomplicated: Secondary | ICD-10-CM

## 2022-02-14 DIAGNOSIS — K219 Gastro-esophageal reflux disease without esophagitis: Secondary | ICD-10-CM

## 2022-02-14 DIAGNOSIS — G4733 Obstructive sleep apnea (adult) (pediatric): Secondary | ICD-10-CM

## 2022-02-14 DIAGNOSIS — F1320 Sedative, hypnotic or anxiolytic dependence, uncomplicated: Secondary | ICD-10-CM | POA: Diagnosis not present

## 2022-02-14 DIAGNOSIS — F411 Generalized anxiety disorder: Secondary | ICD-10-CM | POA: Diagnosis not present

## 2022-02-14 DIAGNOSIS — I25119 Atherosclerotic heart disease of native coronary artery with unspecified angina pectoris: Secondary | ICD-10-CM | POA: Diagnosis not present

## 2022-02-14 DIAGNOSIS — R0681 Apnea, not elsewhere classified: Secondary | ICD-10-CM

## 2022-02-14 DIAGNOSIS — G621 Alcoholic polyneuropathy: Secondary | ICD-10-CM

## 2022-02-14 DIAGNOSIS — F17200 Nicotine dependence, unspecified, uncomplicated: Secondary | ICD-10-CM

## 2022-02-14 DIAGNOSIS — G4700 Insomnia, unspecified: Secondary | ICD-10-CM

## 2022-02-14 MED ORDER — ALPRAZOLAM 1 MG PO TABS: 1.0000 mg | ORAL_TABLET | Freq: Two times a day (BID) | ORAL | 2 refills | Status: DC | PRN

## 2022-02-14 NOTE — Patient Instructions (Signed)
Peripheral Edema  Peripheral edema is swelling that is caused by a buildup of fluid. Peripheral edema most often affects the lower legs, ankles, and feet. It can also develop in the arms, hands, and face. The area of the body that has peripheral edema will look swollen. It may also feel heavy or warm. Your clothes may start to feel tight. Pressing on the area may make a temporary dent in your skin (pitting edema). You may not be able to move your swollen arm or leg as much as usual. There are many causes of peripheral edema. It can happen because of a complication of other conditions such as heart failure, kidney disease, or a problem with your circulation. It also can be a side effect of certain medicines or happen because of an infection. It often happens to women during pregnancy. Sometimes, the cause is not known. Follow these instructions at home: Managing pain, stiffness, and swelling  Raise (elevate) your legs while you are sitting or lying down. Move around often to prevent stiffness and to reduce swelling. Do not sit or stand for long periods of time. Do not wear tight clothing. Do not wear garters on your upper legs. Exercise your legs to get your circulation going. This helps to move the fluid back into your blood vessels, and it may help the swelling go down. Wear compression stockings as told by your health care provider. These stockings help to prevent blood clots and reduce swelling in your legs. It is important that these are the correct size. These stockings should be prescribed by your doctor to prevent possible injuries. If elastic bandages or wraps are recommended, use them as told by your health care provider. Medicines Take over-the-counter and prescription medicines only as told by your health care provider. Your health care provider may prescribe medicine to help your body get rid of excess water (diuretic). Take this medicine if you are told to take it. General  instructions Eat a low-salt (low-sodium) diet as told by your health care provider. Sometimes, eating less salt may reduce swelling. Pay attention to any changes in your symptoms. Moisturize your skin daily to help prevent skin from cracking and draining. Keep all follow-up visits. This is important. Contact a health care provider if: You have a fever. You have swelling in only one leg. You have increased swelling, redness, or pain in one or both of your legs. You have drainage or sores at the area where you have edema. Get help right away if: You have edema that starts suddenly or is getting worse, especially if you are pregnant or have a medical condition. You develop shortness of breath, especially when you are lying down. You have pain in your chest or abdomen. You feel weak. You feel like you will faint. These symptoms may be an emergency. Get help right away. Call 911. Do not wait to see if the symptoms will go away. Do not drive yourself to the hospital. Summary Peripheral edema is swelling that is caused by a buildup of fluid. Peripheral edema most often affects the lower legs, ankles, and feet. Move around often to prevent stiffness and to reduce swelling. Do not sit or stand for long periods of time. Pay attention to any changes in your symptoms. Contact a health care provider if you have edema that starts suddenly or is getting worse, especially if you are pregnant or have a medical condition. Get help right away if you develop shortness of breath, especially when lying down.   This information is not intended to replace advice given to you by your health care provider. Make sure you discuss any questions you have with your health care provider. Document Revised: 02/08/2021 Document Reviewed: 02/08/2021 Elsevier Patient Education  2023 [employer] Inc.  

## 2022-02-14 NOTE — Progress Notes (Signed)
Subjective:    Patient ID: [name], male    DOB: 1940-11-12, 81 y.o.   MRN: [id number]  Chief Complaint  Patient presents with   Medical Management of Chronic Issues   Pt presents to the office today for chronic follow up. PT is followed by Cardiologists for CAD, CHF, Atherosclerosis, Cardiomyopathy, and Ventricular tachycardia every 2 years.  PT has pacemaker and is followed annually for this.     Pt is followed by Dermatologists for psoriasis as needed.    Has OSA and uses CPAP nightly.    He does report drinking 4-5 beers a day. He reports constant burning and aching pain of bilateral feet from alocholic peripheral neuropathy that is 5 out 10. He has taken Lyric without relief.    He has aortic atheroscleroses and takes pravastatin daily.   He has emphysema and smokes 1/4 a pack a day. Denies any SOB.     02/14/2022    9:43 AM 02/04/2022    8:54 AM 01/27/2022    9:52 AM  Last 3 Weights  Weight (lbs) 130 lb 3.2 oz 131 lb 6.4 oz 134 lb 12.8 oz  Weight (kg) 59.058 kg 59.603 kg 61.145 kg     Hypertension This is a chronic problem. The current episode started more than 1 year ago. The problem has been resolved since onset. The problem is controlled. Associated symptoms include anxiety, malaise/fatigue, peripheral edema and shortness of breath ("at times"). Risk factors for coronary artery disease include dyslipidemia, male gender, sedentary lifestyle and smoking/tobacco exposure. The current treatment provides moderate improvement. Hypertensive end-organ damage includes heart failure.  Congestive Heart Failure Presents for follow-up visit. Associated symptoms include edema, fatigue, nocturia (2) and shortness of breath ("at times"). The symptoms have been stable.  Gastroesophageal Reflux He complains of belching and heartburn. This is a chronic problem. The current episode started more than 1 year ago. The problem occurs occasionally. Associated symptoms include fatigue.  Risk factors include smoking/tobacco exposure. He has tried a PPI for the symptoms. The treatment provided moderate relief.  Nicotine Dependence Presents for follow-up visit. Symptoms include fatigue, insomnia and irritability. His urge triggers include company of smokers. The symptoms have been stable. He smokes < 1/2 a pack of cigarettes per day.  Insomnia Primary symptoms: difficulty falling asleep, frequent awakening, malaise/fatigue.   The current episode started more than one year. The onset quality is gradual. Past treatments include medication. The treatment provided moderate relief.  Hyperlipidemia This is a chronic problem. The current episode started more than 1 year ago. The problem is controlled. Recent lipid tests were reviewed and are normal. Associated symptoms include shortness of breath ("at times"). Current antihyperlipidemic treatment includes statins. The current treatment provides moderate improvement of lipids. Risk factors for coronary artery disease include dyslipidemia, male sex, hypertension and a sedentary lifestyle.  Anxiety Presents for follow-up visit. Symptoms include excessive worry, insomnia, irritability, nervous/anxious behavior, restlessness and shortness of breath ("at times"). Symptoms occur occasionally. The severity of symptoms is moderate.    Benign Prostatic Hypertrophy This is a chronic problem. The current episode started more than 1 year ago. Irritative symptoms include nocturia (2). Past treatments include tamsulosin.      Review of Systems  Constitutional:  Positive for fatigue, irritability and malaise/fatigue.  Respiratory:  Positive for shortness of breath ("at times").   Gastrointestinal:  Positive for heartburn.  Genitourinary:  Positive for nocturia (2).  Psychiatric/Behavioral:  The patient is nervous/anxious and has insomnia.   All  other systems reviewed and are negative.      Objective:   Physical Exam Vitals reviewed.   Constitutional:      General: He is not in acute distress.    Appearance: He is well-developed.  HENT:     Head: Normocephalic.     Right Ear: Tympanic membrane normal.     Left Ear: Tympanic membrane normal.  Eyes:     General:        Right eye: No discharge.        Left eye: No discharge.     Pupils: Pupils are equal, round, and reactive to light.  Neck:     Thyroid: No thyromegaly.  Cardiovascular:     Rate and Rhythm: Normal rate and regular rhythm.     Heart sounds: Normal heart sounds. No murmur heard. Pulmonary:     Effort: Pulmonary effort is normal. No respiratory distress.     Breath sounds: Normal breath sounds. No wheezing.  Abdominal:     General: Bowel sounds are normal. There is no distension.     Palpations: Abdomen is soft.     Tenderness: There is no abdominal tenderness.  Musculoskeletal:        General: No tenderness. Normal range of motion.     Cervical back: Normal range of motion and neck supple.  Skin:    General: Skin is warm and dry.     Findings: No erythema or rash.  Neurological:     Mental Status: He is alert and oriented to person, place, and time.     Cranial Nerves: No cranial nerve deficit.     Deep Tendon Reflexes: Reflexes are normal and symmetric.  Psychiatric:        Behavior: Behavior normal.        Thought Content: Thought content normal.        Judgment: Judgment normal.       BP 133/72   Pulse 69   Temp (!) 97.2 F (36.2 C) (Temporal)   Ht '5\' 6"'  (1.676 m)   Wt 130 lb 3.2 oz (59.1 kg)   BMI 21.01 kg/m      Assessment & Plan:  [name] comes in today with chief complaint of Medical Management of Chronic Issues   Diagnosis and orders addressed:  1. GAD (generalized anxiety disorder) - ALPRAZolam (XANAX) 1 MG tablet; Take 1 tablet (1 mg total) by mouth 2 (two) times daily as needed. Keep appt w/ Alyse Low for refills  Dispense: 60 tablet; Refill: 2 - BMP8+EGFR  2. Controlled substance agreement signed -  ALPRAZolam (XANAX) 1 MG tablet; Take 1 tablet (1 mg total) by mouth 2 (two) times daily as needed. Keep appt w/ Alyse Low for refills  Dispense: 60 tablet; Refill: 2 - BMP8+EGFR  3. Benzodiazepine dependence (HCC) - ALPRAZolam (XANAX) 1 MG tablet; Take 1 tablet (1 mg total) by mouth 2 (two) times daily as needed. Keep appt w/ Alyse Low for refills  Dispense: 60 tablet; Refill: 2 - BMP8+EGFR  4. Thoracic aorta atherosclerosis (HCC) - BMP8+EGFR  5. Aortic atherosclerosis (HCC) - BMP8+EGFR  6. Chronic systolic heart failure (HCC) - BMP8+EGFR  7. Atherosclerosis of native coronary artery with angina pectoris, unspecified whether native or transplanted heart (HCC) - BMP8+EGFR  8. Essential hypertension  - BMP8+EGFR  9. GERD with apnea - BMP8+EGFR  10. Gastroesophageal reflux disease, unspecified whether esophagitis present - BMP8+EGFR  11. Alcoholic peripheral neuropathy (HCC) - BMP8+EGFR  12. Insomnia, unspecified type  - BMP8+EGFR  13. TOBACCO ABUSE - BMP8+EGFR  14. Alcohol abuse - BMP8+EGFR  15. Hyperlipidemia, unspecified hyperlipidemia type - BMP8+EGFR  16. OSA (obstructive sleep apnea) - BMP8+EGFR   Labs pending Patient reviewed in [place] controlled database, no flags noted. Contract and drug screen are up to date.  Health Maintenance reviewed Diet and exercise encouraged  Follow up plan: 3 months    Evelina Dun, FNP

## 2022-02-15 LAB — BMP8+EGFR
BUN/Creatinine Ratio: 11 (ref 10–24)
BUN: 12 mg/dL (ref 8–27)
CO2: 22 mmol/L (ref 20–29)
Calcium: 9.6 mg/dL (ref 8.6–10.2)
Chloride: 91 mmol/L — ABNORMAL LOW (ref 96–106)
Creatinine, Ser: 1.1 mg/dL (ref 0.76–1.27)
Glucose: 92 mg/dL (ref 70–99)
Potassium: 4.9 mmol/L (ref 3.5–5.2)
Sodium: 131 mmol/L — ABNORMAL LOW (ref 134–144)
eGFR: 69 mL/min/{1.73_m2} (ref >59)

## 2022-02-20 ENCOUNTER — Other Ambulatory Visit: Payer: Self-pay | Admitting: Family

## 2022-02-20 DIAGNOSIS — R609 Edema, unspecified: Secondary | ICD-10-CM

## 2022-02-20 DIAGNOSIS — I5022 Chronic systolic (congestive) heart failure: Secondary | ICD-10-CM

## 2022-03-01 NOTE — Progress Notes (Signed)
Remote ICD transmission.   

## 2022-03-24 ENCOUNTER — Other Ambulatory Visit: Payer: Self-pay | Admitting: Family

## 2022-04-04 ENCOUNTER — Ambulatory Visit: Payer: Medicare Other | Admitting: Family

## 2022-04-04 ENCOUNTER — Encounter: Payer: Self-pay | Admitting: *Deleted

## 2022-04-05 ENCOUNTER — Telehealth: Payer: Medicare Other | Admitting: Adult Health

## 2022-04-11 DIAGNOSIS — R063 Periodic breathing: Secondary | ICD-10-CM | POA: Diagnosis not present

## 2022-04-11 DIAGNOSIS — G4737 Central sleep apnea in conditions classified elsewhere: Secondary | ICD-10-CM | POA: Diagnosis not present

## 2022-04-23 ENCOUNTER — Other Ambulatory Visit: Payer: Self-pay | Admitting: Internal Medicine

## 2022-04-23 ENCOUNTER — Other Ambulatory Visit: Payer: Self-pay | Admitting: Family

## 2022-04-23 DIAGNOSIS — I10 Essential (primary) hypertension: Secondary | ICD-10-CM

## 2022-04-23 DIAGNOSIS — K219 Gastro-esophageal reflux disease without esophagitis: Secondary | ICD-10-CM

## 2022-04-25 DIAGNOSIS — M47816 Spondylosis without myelopathy or radiculopathy, lumbar region: Secondary | ICD-10-CM | POA: Diagnosis not present

## 2022-04-25 DIAGNOSIS — I5023 Acute on chronic systolic (congestive) heart failure: Secondary | ICD-10-CM | POA: Diagnosis not present

## 2022-04-25 DIAGNOSIS — Z881 Allergy status to other antibiotic agents status: Secondary | ICD-10-CM | POA: Diagnosis not present

## 2022-04-25 DIAGNOSIS — I509 Heart failure, unspecified: Secondary | ICD-10-CM | POA: Diagnosis not present

## 2022-04-25 DIAGNOSIS — R0602 Shortness of breath: Secondary | ICD-10-CM | POA: Diagnosis not present

## 2022-04-25 DIAGNOSIS — M5126 Other intervertebral disc displacement, lumbar region: Secondary | ICD-10-CM | POA: Diagnosis not present

## 2022-04-25 DIAGNOSIS — M545 Low back pain, unspecified: Secondary | ICD-10-CM | POA: Diagnosis not present

## 2022-04-26 ENCOUNTER — Ambulatory Visit (INDEPENDENT_AMBULATORY_CARE_PROVIDER_SITE_OTHER): Payer: Medicare Other

## 2022-04-26 DIAGNOSIS — I428 Other cardiomyopathies: Secondary | ICD-10-CM | POA: Diagnosis not present

## 2022-04-26 LAB — CUP PACEART REMOTE DEVICE CHECK
Battery Remaining Longevity: 18 mo
Battery Remaining Percentage: 28 %
Brady Statistic RA Percent Paced: 2 %
Brady Statistic RV Percent Paced: 99 %
Date Time Interrogation Session: 20231107011000
HighPow Impedance: 52 Ohm
Implantable Lead Connection Status: 753985
Implantable Lead Connection Status: 753985
Implantable Lead Connection Status: 753985
Implantable Lead Implant Date: 20040312
Implantable Lead Implant Date: 20040312
Implantable Lead Implant Date: 20040312
Implantable Lead Location: 753858
Implantable Lead Location: 753859
Implantable Lead Location: 753860
Implantable Lead Model: 158
Implantable Lead Model: 4087
Implantable Lead Model: 4513
Implantable Lead Serial Number: 129158
Implantable Lead Serial Number: 209446
Implantable Lead Serial Number: 407264
Implantable Pulse Generator Implant Date: 20150917
Lead Channel Impedance Value: 1130 Ohm
Lead Channel Impedance Value: 543 Ohm
Lead Channel Impedance Value: 656 Ohm
Lead Channel Pacing Threshold Amplitude: 0.7 V
Lead Channel Pacing Threshold Amplitude: 1.2 V
Lead Channel Pacing Threshold Amplitude: 2.3 V
Lead Channel Pacing Threshold Pulse Width: 0.4 ms
Lead Channel Pacing Threshold Pulse Width: 0.4 ms
Lead Channel Pacing Threshold Pulse Width: 0.8 ms
Lead Channel Setting Pacing Amplitude: 2 V
Lead Channel Setting Pacing Amplitude: 2.5 V
Lead Channel Setting Pacing Amplitude: 3 V
Lead Channel Setting Pacing Pulse Width: 0.4 ms
Lead Channel Setting Pacing Pulse Width: 0.8 ms
Lead Channel Setting Sensing Sensitivity: 0.6 mV
Lead Channel Setting Sensing Sensitivity: 1 mV
Pulse Gen Serial Number: 106298

## 2022-05-03 ENCOUNTER — Ambulatory Visit (INDEPENDENT_AMBULATORY_CARE_PROVIDER_SITE_OTHER): Payer: Medicare Other | Admitting: Family

## 2022-05-03 ENCOUNTER — Encounter: Payer: Self-pay | Admitting: Family

## 2022-05-03 VITALS — BP 125/65 | HR 69 | Temp 97.4°F | Ht 66.0 in | Wt 132.8 lb

## 2022-05-03 DIAGNOSIS — I5022 Chronic systolic (congestive) heart failure: Secondary | ICD-10-CM

## 2022-05-03 DIAGNOSIS — I509 Heart failure, unspecified: Secondary | ICD-10-CM | POA: Diagnosis not present

## 2022-05-03 DIAGNOSIS — R609 Edema, unspecified: Secondary | ICD-10-CM | POA: Diagnosis not present

## 2022-05-03 DIAGNOSIS — Z09 Encounter for follow-up examination after completed treatment for conditions other than malignant neoplasm: Secondary | ICD-10-CM | POA: Diagnosis not present

## 2022-05-03 DIAGNOSIS — Z23 Encounter for immunization: Secondary | ICD-10-CM | POA: Diagnosis not present

## 2022-05-03 MED ORDER — FUROSEMIDE 20 MG PO TABS: ORAL_TABLET | ORAL | 1 refills | Status: DC

## 2022-05-03 NOTE — Progress Notes (Signed)
Subjective:    Patient ID: [name], male    DOB: [birth year], 81 y.o.   MRN: [id number]  Chief Complaint  Patient presents with   ER follow up   Pt presents to the office today for ED follow up. He went to the ED on 04/25/22 with increased SOB with exertion. They increased his Lasix to 40 mg for a week. He lost 2 lbs since the ED. Reports his breathing is improved.      05/03/2022    9:19 AM 02/14/2022    9:43 AM 02/04/2022    8:54 AM  Last 3 Weights  Weight (lbs) 132 lb 12.8 oz 130 lb 3.2 oz 131 lb 6.4 oz  Weight (kg) 60.238 kg 59.058 kg 59.603 kg    He has OSA and uses CPAP nightly.   Congestive Heart Failure Presents for follow-up visit. Associated symptoms include edema, fatigue, muscle weakness and shortness of breath. Pertinent negatives include no chest pressure or nocturia. The symptoms have been worsening.  Hypertension This is a chronic problem. The current episode started more than 1 year ago. The problem has been resolved since onset. The problem is controlled. Associated symptoms include malaise/fatigue and shortness of breath. Pertinent negatives include no peripheral edema.      Review of Systems  Constitutional:  Positive for fatigue and malaise/fatigue.  Respiratory:  Positive for shortness of breath.   Genitourinary:  Negative for nocturia.  Musculoskeletal:  Positive for muscle weakness.  All other systems reviewed and are negative.      Objective:   Physical Exam Vitals reviewed.  Constitutional:      General: He is not in acute distress.    Appearance: He is well-developed.  HENT:     Head: Normocephalic.     Right Ear: Tympanic membrane normal.     Left Ear: Tympanic membrane normal.  Eyes:     General:        Right eye: No discharge.        Left eye: No discharge.     Pupils: Pupils are equal, round, and reactive to light.  Neck:     Thyroid: No thyromegaly.  Cardiovascular:     Rate and Rhythm: Normal rate and regular rhythm.      Heart sounds: Normal heart sounds. No murmur heard. Pulmonary:     Effort: Pulmonary effort is normal. No respiratory distress.     Breath sounds: Normal breath sounds. No wheezing.  Abdominal:     General: Bowel sounds are normal. There is no distension.     Palpations: Abdomen is soft.     Tenderness: There is no abdominal tenderness.  Musculoskeletal:        General: No tenderness. Normal range of motion.     Cervical back: Normal range of motion and neck supple.     Right lower leg: Edema (2+) present.     Left lower leg: Edema (2+) present.     Comments: Pain in lumbar with flexion and extension  Skin:    General: Skin is warm and dry.     Findings: No erythema or rash.  Neurological:     Mental Status: He is alert and oriented to person, place, and time.     Cranial Nerves: No cranial nerve deficit.     Deep Tendon Reflexes: Reflexes are normal and symmetric.  Psychiatric:        Behavior: Behavior normal.        Thought Content: Thought content normal.  Judgment: Judgment normal.       BP 125/65   Pulse 69   Temp (!) 97.4 F (36.3 C) (Temporal)   Ht _0  (1.676 m)   Wt 132 lb 12.8 oz (60.2 kg)   SpO2 98%   BMI 21.43 kg/m      Assessment & Plan:  [name] comes in today with chief complaint of ER follow up   Diagnosis and orders addressed:  1. Need for immunization against influenza - Flu Vaccine QUAD High Dose(Fluad) - CMP14+EGFR  2. Congestive heart failure, unspecified HF chronicity, unspecified heart failure type ([place]) - CMP14+EGFR  3. Hospital discharge follow-up - CMP14+EGFR  4. Chronic systolic heart failure (HCC) - furosemide (LASIX) 20 MG tablet; 40 mg daily for next 5 days, then decrease to 20 mg daily  Dispense: 95 tablet; Refill: 1 - CMP14+EGFR  5. Peripheral edema - furosemide (LASIX) 20 MG tablet; 40 mg daily for next 5 days, then decrease to 20 mg daily  Dispense: 95 tablet; Refill: 1 - CMP14+EGFR  Will continue  Lasix 40 mg for the next 5 days then decrease to 20 mg daily Low salt diet Labs pending Health Maintenance reviewed Diet and exercise encouraged  Follow up plan: Keep chronic follow up   Evelina Dun, FNP

## 2022-05-03 NOTE — Patient Instructions (Signed)
Heart Failure, Diagnosis  Heart failure is a condition in which the heart has trouble pumping blood. This may mean that the heart cannot pump enough blood out to the body or that the heart does not fill up with enough blood. For some people with heart failure, fluid may back up into the lungs. There may also be swelling (edema) in the lower legs. Heart failure is usually a long-term (chronic) condition. It is important for you to take good care of yourself and follow the treatment plan from your health care provider. Different stages of heart failure have different treatment plans. The stages are: Stage A: At risk for heart failure. Having no symptoms of heart failure, but being at risk for developing heart failure. Stage B: Pre-heart failure. Having no symptoms of heart failure, but having structural changes to the heart that indicate heart failure. Stage C: Symptomatic heart failure. Having symptoms of heart failure in addition to structural changes to the heart that indicate heart failure. Stage D: Advanced heart failure. Having symptoms that interfere with daily life and frequent hospitalizations related to heart failure. What are the causes? This condition may be caused by: High blood pressure (hypertension). Hypertension causes the heart muscle to work harder than normal. Coronary artery disease, or CAD. CAD is the buildup of cholesterol and fat (plaque) in the arteries of the heart. Heart attack, also called myocardial infarction. This injures the heart muscle, making it hard for the heart to pump blood. Abnormal heart valves. The valves do not open and close properly, forcing the heart to pump harder to keep the blood flowing. Heart muscle disease, inflammation, or infection (cardiomyopathy or myocarditis). This is damage to the heart muscle. It can increase the risk of heart failure. Lung disease. The heart works harder when the lungs are not healthy. What increases the risk? The risk  of heart failure increases as a person ages. This condition is also more likely to develop in people who: Are obese. Use tobacco or nicotine products. Abuse alcohol or drugs. Have taken medicines that can damage the heart, such as chemotherapy drugs. Have any of these conditions: Diabetes. Abnormal heart rhythms. Thyroid problems. Low blood counts (anemia). Chronic kidney disease. Have a family history of heart failure. What are the signs or symptoms? Symptoms of this condition include: Shortness of breath with activity, such as when climbing stairs. A cough that does not go away. Swelling of the feet, ankles, legs, or abdomen. Losing or gaining weight for no reason. Trouble breathing when lying flat. Waking from sleep because of the need to sit up and get more air. Rapid heartbeat. Other symptoms may include: Tiredness (fatigue) and loss of energy. Feeling light-headed, dizzy, or close to fainting. Nausea or loss of appetite. Waking up more often during the night to urinate (nocturia). Confusion. How is this diagnosed? This condition is diagnosed based on: Your medical history, symptoms, and a physical exam. Blood tests. Diagnostic tests, which may include: Echocardiogram. Electrocardiogram (ECG). Chest X-ray. Exercise stress test. Cardiac MRI. Cardiac catheterization and angiogram. Radionuclide scans. How is this treated? Treatment for this condition is aimed at managing the symptoms of heart failure. Medicines Treatment may include medicines that: Help lower blood pressure by relaxing (dilating) the blood vessels. These medicines are called ACE inhibitors (angiotensin-converting enzyme), ARBs (angiotensin receptor blockers), or vasodilators. Cause the kidneys to remove salt and water from the blood through urination (diuretics). Improve heart muscle strength and prevent the heart from beating too fast (beta blockers). Increase the   force of the heartbeat  (digoxin). Lower heart rates. Certain diabetes medicines (SGLT-2 inhibitors) may also be used in treatment. Healthy behavior changes Treatment may also include making healthy lifestyle changes, such as: Reaching and staying at a healthy weight. Not using tobacco or nicotine products. Eating heart-healthy foods. Limiting or avoiding alcohol. Stopping the use of illegal drugs. Being physically active. Participating in a cardiac rehabilitation program, which is a treatment program to improve your health and well-being through exercise training, education, and counseling. Other treatments Other treatments may include: Procedures to open blocked arteries or repair damaged valves. Placing a pacemaker to improve heart function (cardiac resynchronization therapy). Placing a device to treat serious abnormal heart rhythms (implantable cardioverter defibrillator, or ICD). Placing a device to improve the pumping ability of the heart (left ventricular assist device, or LVAD). Receiving a healthy heart from a donor (heart transplant). This is done when other treatments have not helped. Follow these instructions at home: Manage other health conditions as told by your health care provider. These may include hypertension, diabetes, thyroid disease, or abnormal heart rhythms. Get ongoing education and support as needed. Learn as much as you can about heart failure. Keep all follow-up visits. This is important. Where to find more information American Heart Association: www.heart.org Centers for Disease Control and Prevention: www.cdc.gov NIH National Institute on Aging: www.nia.nih.gov Summary Heart failure is a condition in which the heart has trouble pumping blood. This condition is commonly caused by high blood pressure and other diseases of the heart and lungs. Symptoms of this condition include shortness of breath, tiredness (fatigue), nausea, and swelling of the feet, ankles, legs, or  abdomen. Treatments for this condition may include medicines, lifestyle changes, and surgery. Manage other health conditions as told by your health care provider. This information is not intended to replace advice given to you by your health care provider. Make sure you discuss any questions you have with your health care provider. Document Revised: 09/14/2021 Document Reviewed: 12/28/2019 Elsevier Patient Education  2023 Elsevier Inc.  

## 2022-05-04 LAB — CMP14+EGFR
ALT: 9 IU/L (ref 0–44)
AST: 26 IU/L (ref 0–40)
Albumin/Globulin Ratio: 1.4 (ref 1.2–2.2)
Albumin: 4.2 g/dL (ref 3.8–4.8)
Alkaline Phosphatase: 204 IU/L — ABNORMAL HIGH (ref 44–121)
BUN/Creatinine Ratio: 10 (ref 10–24)
BUN: 12 mg/dL (ref 8–27)
Bilirubin Total: 0.7 mg/dL (ref 0.0–1.2)
CO2: 22 mmol/L (ref 20–29)
Calcium: 9.6 mg/dL (ref 8.6–10.2)
Chloride: 89 mmol/L — ABNORMAL LOW (ref 96–106)
Creatinine, Ser: 1.21 mg/dL (ref 0.76–1.27)
Globulin, Total: 2.9 g/dL (ref 1.5–4.5)
Glucose: 119 mg/dL — ABNORMAL HIGH (ref 70–99)
Potassium: 4 mmol/L (ref 3.5–5.2)
Sodium: 130 mmol/L — ABNORMAL LOW (ref 134–144)
Total Protein: 7.1 g/dL (ref 6.0–8.5)
eGFR: 61 mL/min/{1.73_m2} (ref >59)

## 2022-05-13 DIAGNOSIS — Z79899 Other long term (current) drug therapy: Secondary | ICD-10-CM | POA: Diagnosis not present

## 2022-05-13 DIAGNOSIS — I69391 Dysphagia following cerebral infarction: Secondary | ICD-10-CM | POA: Diagnosis not present

## 2022-05-13 DIAGNOSIS — I63511 Cerebral infarction due to unspecified occlusion or stenosis of right middle cerebral artery: Secondary | ICD-10-CM | POA: Diagnosis not present

## 2022-05-13 DIAGNOSIS — I5033 Acute on chronic diastolic (congestive) heart failure: Secondary | ICD-10-CM | POA: Diagnosis not present

## 2022-05-13 DIAGNOSIS — I6381 Other cerebral infarction due to occlusion or stenosis of small artery: Secondary | ICD-10-CM | POA: Insufficient documentation

## 2022-05-13 DIAGNOSIS — G4733 Obstructive sleep apnea (adult) (pediatric): Secondary | ICD-10-CM | POA: Diagnosis not present

## 2022-05-13 DIAGNOSIS — I4819 Other persistent atrial fibrillation: Secondary | ICD-10-CM | POA: Diagnosis not present

## 2022-05-13 DIAGNOSIS — E876 Hypokalemia: Secondary | ICD-10-CM | POA: Diagnosis not present

## 2022-05-13 DIAGNOSIS — J449 Chronic obstructive pulmonary disease, unspecified: Secondary | ICD-10-CM | POA: Diagnosis not present

## 2022-05-13 DIAGNOSIS — E119 Type 2 diabetes mellitus without complications: Secondary | ICD-10-CM | POA: Diagnosis not present

## 2022-05-13 DIAGNOSIS — I5023 Acute on chronic systolic (congestive) heart failure: Secondary | ICD-10-CM | POA: Diagnosis not present

## 2022-05-13 DIAGNOSIS — J9621 Acute and chronic respiratory failure with hypoxia: Secondary | ICD-10-CM | POA: Diagnosis not present

## 2022-05-13 DIAGNOSIS — I2510 Atherosclerotic heart disease of native coronary artery without angina pectoris: Secondary | ICD-10-CM | POA: Diagnosis not present

## 2022-05-13 DIAGNOSIS — Z8719 Personal history of other diseases of the digestive system: Secondary | ICD-10-CM | POA: Diagnosis not present

## 2022-05-13 DIAGNOSIS — Z950 Presence of cardiac pacemaker: Secondary | ICD-10-CM | POA: Diagnosis not present

## 2022-05-13 DIAGNOSIS — R109 Unspecified abdominal pain: Secondary | ICD-10-CM | POA: Diagnosis not present

## 2022-05-13 DIAGNOSIS — R29818 Other symptoms and signs involving the nervous system: Secondary | ICD-10-CM | POA: Diagnosis not present

## 2022-05-13 DIAGNOSIS — Z881 Allergy status to other antibiotic agents status: Secondary | ICD-10-CM | POA: Diagnosis not present

## 2022-05-13 DIAGNOSIS — I639 Cerebral infarction, unspecified: Secondary | ICD-10-CM | POA: Diagnosis not present

## 2022-05-13 DIAGNOSIS — R52 Pain, unspecified: Secondary | ICD-10-CM | POA: Diagnosis not present

## 2022-05-13 DIAGNOSIS — R29898 Other symptoms and signs involving the musculoskeletal system: Secondary | ICD-10-CM | POA: Diagnosis not present

## 2022-05-13 DIAGNOSIS — E871 Hypo-osmolality and hyponatremia: Secondary | ICD-10-CM | POA: Diagnosis not present

## 2022-05-13 DIAGNOSIS — J811 Chronic pulmonary edema: Secondary | ICD-10-CM | POA: Diagnosis not present

## 2022-05-13 DIAGNOSIS — I480 Paroxysmal atrial fibrillation: Secondary | ICD-10-CM | POA: Diagnosis not present

## 2022-05-13 DIAGNOSIS — J439 Emphysema, unspecified: Secondary | ICD-10-CM | POA: Diagnosis not present

## 2022-05-13 DIAGNOSIS — Z9989 Dependence on other enabling machines and devices: Secondary | ICD-10-CM | POA: Diagnosis not present

## 2022-05-13 DIAGNOSIS — I69392 Facial weakness following cerebral infarction: Secondary | ICD-10-CM | POA: Diagnosis not present

## 2022-05-13 DIAGNOSIS — K5909 Other constipation: Secondary | ICD-10-CM | POA: Diagnosis not present

## 2022-05-13 DIAGNOSIS — Z20822 Contact with and (suspected) exposure to covid-19: Secondary | ICD-10-CM | POA: Diagnosis not present

## 2022-05-13 DIAGNOSIS — Z95810 Presence of automatic (implantable) cardiac defibrillator: Secondary | ICD-10-CM | POA: Diagnosis not present

## 2022-05-13 DIAGNOSIS — Z743 Need for continuous supervision: Secondary | ICD-10-CM | POA: Diagnosis not present

## 2022-05-13 DIAGNOSIS — Z8639 Personal history of other endocrine, nutritional and metabolic disease: Secondary | ICD-10-CM | POA: Diagnosis not present

## 2022-05-13 DIAGNOSIS — R0902 Hypoxemia: Secondary | ICD-10-CM | POA: Diagnosis not present

## 2022-05-13 DIAGNOSIS — E440 Moderate protein-calorie malnutrition: Secondary | ICD-10-CM | POA: Diagnosis not present

## 2022-05-13 DIAGNOSIS — R29810 Facial weakness: Secondary | ICD-10-CM | POA: Diagnosis not present

## 2022-05-13 DIAGNOSIS — Z8679 Personal history of other diseases of the circulatory system: Secondary | ICD-10-CM | POA: Diagnosis not present

## 2022-05-13 DIAGNOSIS — I428 Other cardiomyopathies: Secondary | ICD-10-CM | POA: Diagnosis not present

## 2022-05-13 DIAGNOSIS — I6503 Occlusion and stenosis of bilateral vertebral arteries: Secondary | ICD-10-CM | POA: Diagnosis not present

## 2022-05-13 DIAGNOSIS — E1151 Type 2 diabetes mellitus with diabetic peripheral angiopathy without gangrene: Secondary | ICD-10-CM | POA: Diagnosis not present

## 2022-05-13 DIAGNOSIS — I63319 Cerebral infarction due to thrombosis of unspecified middle cerebral artery: Secondary | ICD-10-CM | POA: Diagnosis not present

## 2022-05-13 DIAGNOSIS — J90 Pleural effusion, not elsewhere classified: Secondary | ICD-10-CM | POA: Diagnosis not present

## 2022-05-13 DIAGNOSIS — I69354 Hemiplegia and hemiparesis following cerebral infarction affecting left non-dominant side: Secondary | ICD-10-CM | POA: Diagnosis not present

## 2022-05-13 DIAGNOSIS — Z9981 Dependence on supplemental oxygen: Secondary | ICD-10-CM | POA: Diagnosis not present

## 2022-05-13 DIAGNOSIS — R4781 Slurred speech: Secondary | ICD-10-CM | POA: Diagnosis not present

## 2022-05-13 DIAGNOSIS — I739 Peripheral vascular disease, unspecified: Secondary | ICD-10-CM | POA: Diagnosis not present

## 2022-05-13 DIAGNOSIS — J441 Chronic obstructive pulmonary disease with (acute) exacerbation: Secondary | ICD-10-CM | POA: Diagnosis not present

## 2022-05-13 DIAGNOSIS — Z7901 Long term (current) use of anticoagulants: Secondary | ICD-10-CM | POA: Diagnosis not present

## 2022-05-13 DIAGNOSIS — I63311 Cerebral infarction due to thrombosis of right middle cerebral artery: Secondary | ICD-10-CM | POA: Diagnosis not present

## 2022-05-13 DIAGNOSIS — I110 Hypertensive heart disease with heart failure: Secondary | ICD-10-CM | POA: Diagnosis not present

## 2022-05-13 DIAGNOSIS — R479 Unspecified speech disturbances: Secondary | ICD-10-CM | POA: Diagnosis not present

## 2022-05-13 DIAGNOSIS — F17210 Nicotine dependence, cigarettes, uncomplicated: Secondary | ICD-10-CM | POA: Diagnosis not present

## 2022-05-13 DIAGNOSIS — I6523 Occlusion and stenosis of bilateral carotid arteries: Secondary | ICD-10-CM | POA: Diagnosis not present

## 2022-05-13 DIAGNOSIS — R531 Weakness: Secondary | ICD-10-CM | POA: Diagnosis not present

## 2022-05-13 DIAGNOSIS — I509 Heart failure, unspecified: Secondary | ICD-10-CM | POA: Diagnosis not present

## 2022-05-14 DIAGNOSIS — J449 Chronic obstructive pulmonary disease, unspecified: Secondary | ICD-10-CM | POA: Diagnosis not present

## 2022-05-14 DIAGNOSIS — Z79899 Other long term (current) drug therapy: Secondary | ICD-10-CM | POA: Diagnosis not present

## 2022-05-14 DIAGNOSIS — I509 Heart failure, unspecified: Secondary | ICD-10-CM | POA: Diagnosis not present

## 2022-05-14 DIAGNOSIS — Z950 Presence of cardiac pacemaker: Secondary | ICD-10-CM | POA: Diagnosis not present

## 2022-05-14 DIAGNOSIS — I69392 Facial weakness following cerebral infarction: Secondary | ICD-10-CM | POA: Diagnosis not present

## 2022-05-14 DIAGNOSIS — Z8639 Personal history of other endocrine, nutritional and metabolic disease: Secondary | ICD-10-CM | POA: Diagnosis not present

## 2022-05-14 DIAGNOSIS — I69354 Hemiplegia and hemiparesis following cerebral infarction affecting left non-dominant side: Secondary | ICD-10-CM | POA: Diagnosis not present

## 2022-05-15 DIAGNOSIS — I69354 Hemiplegia and hemiparesis following cerebral infarction affecting left non-dominant side: Secondary | ICD-10-CM | POA: Diagnosis not present

## 2022-05-15 DIAGNOSIS — J90 Pleural effusion, not elsewhere classified: Secondary | ICD-10-CM | POA: Diagnosis not present

## 2022-05-15 DIAGNOSIS — I69392 Facial weakness following cerebral infarction: Secondary | ICD-10-CM | POA: Diagnosis not present

## 2022-05-15 DIAGNOSIS — I110 Hypertensive heart disease with heart failure: Secondary | ICD-10-CM | POA: Diagnosis not present

## 2022-05-15 DIAGNOSIS — Z9989 Dependence on other enabling machines and devices: Secondary | ICD-10-CM | POA: Diagnosis not present

## 2022-05-15 DIAGNOSIS — E876 Hypokalemia: Secondary | ICD-10-CM | POA: Diagnosis not present

## 2022-05-15 DIAGNOSIS — Z79899 Other long term (current) drug therapy: Secondary | ICD-10-CM | POA: Diagnosis not present

## 2022-05-15 DIAGNOSIS — F17210 Nicotine dependence, cigarettes, uncomplicated: Secondary | ICD-10-CM | POA: Diagnosis not present

## 2022-05-15 DIAGNOSIS — J441 Chronic obstructive pulmonary disease with (acute) exacerbation: Secondary | ICD-10-CM | POA: Diagnosis not present

## 2022-05-15 DIAGNOSIS — R0902 Hypoxemia: Secondary | ICD-10-CM | POA: Diagnosis not present

## 2022-05-15 DIAGNOSIS — I509 Heart failure, unspecified: Secondary | ICD-10-CM | POA: Diagnosis not present

## 2022-05-16 DIAGNOSIS — Z79899 Other long term (current) drug therapy: Secondary | ICD-10-CM | POA: Diagnosis not present

## 2022-05-16 DIAGNOSIS — I509 Heart failure, unspecified: Secondary | ICD-10-CM | POA: Diagnosis not present

## 2022-05-16 DIAGNOSIS — Z9989 Dependence on other enabling machines and devices: Secondary | ICD-10-CM | POA: Diagnosis not present

## 2022-05-16 DIAGNOSIS — E876 Hypokalemia: Secondary | ICD-10-CM | POA: Diagnosis not present

## 2022-05-16 DIAGNOSIS — J441 Chronic obstructive pulmonary disease with (acute) exacerbation: Secondary | ICD-10-CM | POA: Diagnosis not present

## 2022-05-16 DIAGNOSIS — R0902 Hypoxemia: Secondary | ICD-10-CM | POA: Diagnosis not present

## 2022-05-16 DIAGNOSIS — Z9981 Dependence on supplemental oxygen: Secondary | ICD-10-CM | POA: Diagnosis not present

## 2022-05-16 DIAGNOSIS — I69392 Facial weakness following cerebral infarction: Secondary | ICD-10-CM | POA: Diagnosis not present

## 2022-05-16 DIAGNOSIS — I110 Hypertensive heart disease with heart failure: Secondary | ICD-10-CM | POA: Diagnosis not present

## 2022-05-16 DIAGNOSIS — F17210 Nicotine dependence, cigarettes, uncomplicated: Secondary | ICD-10-CM | POA: Diagnosis not present

## 2022-05-17 ENCOUNTER — Ambulatory Visit: Payer: Medicare Other | Admitting: Family

## 2022-05-17 DIAGNOSIS — I509 Heart failure, unspecified: Secondary | ICD-10-CM | POA: Insufficient documentation

## 2022-05-18 DIAGNOSIS — I509 Heart failure, unspecified: Secondary | ICD-10-CM | POA: Diagnosis not present

## 2022-05-18 DIAGNOSIS — J90 Pleural effusion, not elsewhere classified: Secondary | ICD-10-CM | POA: Diagnosis not present

## 2022-05-20 NOTE — Progress Notes (Signed)
Remote ICD transmission.   

## 2022-05-23 ENCOUNTER — Telehealth: Payer: Self-pay | Admitting: Family

## 2022-05-23 DIAGNOSIS — I509 Heart failure, unspecified: Secondary | ICD-10-CM

## 2022-05-23 NOTE — Telephone Encounter (Signed)
Orders placed.

## 2022-05-23 NOTE — Telephone Encounter (Signed)
Calling about new therapy orders for patient

## 2022-05-24 ENCOUNTER — Encounter: Payer: Self-pay | Admitting: Nurse Practitioner

## 2022-05-24 ENCOUNTER — Ambulatory Visit (INDEPENDENT_AMBULATORY_CARE_PROVIDER_SITE_OTHER): Payer: Medicare Other | Admitting: Nurse Practitioner

## 2022-05-24 VITALS — BP 105/64 | HR 81 | Temp 96.1°F | Resp 20 | Ht 66.0 in | Wt 119.0 lb

## 2022-05-24 DIAGNOSIS — Z09 Encounter for follow-up examination after completed treatment for conditions other than malignant neoplasm: Secondary | ICD-10-CM

## 2022-05-24 DIAGNOSIS — F411 Generalized anxiety disorder: Secondary | ICD-10-CM | POA: Diagnosis not present

## 2022-05-24 DIAGNOSIS — I5022 Chronic systolic (congestive) heart failure: Secondary | ICD-10-CM | POA: Diagnosis not present

## 2022-05-24 DIAGNOSIS — Z8673 Personal history of transient ischemic attack (TIA), and cerebral infarction without residual deficits: Secondary | ICD-10-CM

## 2022-05-24 MED ORDER — ALPRAZOLAM 1 MG PO TABS: 1.0000 mg | ORAL_TABLET | Freq: Two times a day (BID) | ORAL | 0 refills | Status: DC | PRN

## 2022-05-24 NOTE — Progress Notes (Signed)
Subjective:    Patient ID: [name], male    DOB: 10-Sep-1940, 81 y.o.   MRN: [id number]   Chief Complaint: Hospitalization Follow-up   HPI Patient was in hospital for stroke. His family says the left side of face is droopy and left hand is not working. His hand strength nad arm strength is almost back to normal but the left side of his face is til droopy. Other wise doing well. He stayed extra in hospital because of his congestive heart failure. Palliative care is suppose to come tomorrow to see him. He has follow up with cardiology on THursday.     Review of Systems  Constitutional:  Negative for diaphoresis.  Eyes:  Negative for pain.  Respiratory:  Negative for shortness of breath.   Cardiovascular:  Negative for chest pain, palpitations and leg swelling.  Gastrointestinal:  Negative for abdominal pain.  Endocrine: Negative for polydipsia.  Skin:  Negative for rash.  Neurological:  Negative for dizziness, weakness and headaches.  Hematological:  Does not bruise/bleed easily.  All other systems reviewed and are negative.      Objective:   Physical Exam Vitals and nursing note reviewed.  Constitutional:      Appearance: Normal appearance. He is well-developed.  HENT:     Head: Normocephalic.     Nose: Nose normal.     Mouth/Throat:     Mouth: Mucous membranes are moist.     Pharynx: Oropharynx is clear.  Eyes:     Pupils: Pupils are equal, round, and reactive to light.  Neck:     Thyroid: No thyroid mass or thyromegaly.     Vascular: No carotid bruit or JVD.     Trachea: Phonation normal.  Cardiovascular:     Rate and Rhythm: Normal rate and regular rhythm.  Pulmonary:     Effort: Pulmonary effort is normal. No respiratory distress.     Breath sounds: Normal breath sounds.  Abdominal:     General: Bowel sounds are normal.     Palpations: Abdomen is soft.     Tenderness: There is no abdominal tenderness.  Musculoskeletal:        General: Normal range  of motion.     Cervical back: Normal range of motion and neck supple.     Comments: Walking with walker- gait slow and steady  Lymphadenopathy:     Cervical: No cervical adenopathy.  Skin:    General: Skin is warm and dry.  Neurological:     Mental Status: He is alert and oriented to person, place, and time.  Psychiatric:        Behavior: Behavior normal.        Thought Content: Thought content normal.        Judgment: Judgment normal.     BP 105/64   Pulse 81   Temp (!) 96.1 F (35.6 C) (Temporal)   Resp 20   Ht '5\' 6"'$  (1.676 m)   Wt 119 lb (54 kg)   SpO2 95%   BMI 19.21 kg/m        Assessment & Plan:  [name] in today with chief complaint of Hospitalization Follow-up   1. History of CVA (cerebrovascular accident) No permanent effects  2. Chronic systolic heart failure ([place]) Keep follow up appointment with cardiology Limit fluid intake  3. GAD (generalized anxiety disorder) Stress management - ALPRAZolam (XANAX) 1 MG tablet; Take 1 tablet (1 mg total) by mouth 2 (two) times daily as needed. Keep  appt w/ Alyse Low for refills  Dispense: 60 tablet; Refill: 0  4. Hospital discharge follow-up Hospital records reviewed    The above assessment and management plan was discussed with the patient. The patient verbalized understanding of and has agreed to the management plan. Patient is aware to call the clinic if symptoms persist or worsen. Patient is aware when to return to the clinic for a follow-up visit. Patient educated on when it is appropriate to go to the emergency department.   Mary-Margaret Hassell Done, FNP

## 2022-05-25 ENCOUNTER — Encounter: Payer: Self-pay | Admitting: Cardiology

## 2022-05-25 DIAGNOSIS — E119 Type 2 diabetes mellitus without complications: Secondary | ICD-10-CM | POA: Diagnosis not present

## 2022-05-25 DIAGNOSIS — I69991 Dysphagia following unspecified cerebrovascular disease: Secondary | ICD-10-CM | POA: Diagnosis not present

## 2022-05-25 DIAGNOSIS — I429 Cardiomyopathy, unspecified: Secondary | ICD-10-CM | POA: Diagnosis not present

## 2022-05-25 DIAGNOSIS — I480 Paroxysmal atrial fibrillation: Secondary | ICD-10-CM | POA: Diagnosis not present

## 2022-05-25 DIAGNOSIS — J449 Chronic obstructive pulmonary disease, unspecified: Secondary | ICD-10-CM | POA: Diagnosis not present

## 2022-05-25 DIAGNOSIS — I509 Heart failure, unspecified: Secondary | ICD-10-CM | POA: Diagnosis not present

## 2022-05-25 DIAGNOSIS — I110 Hypertensive heart disease with heart failure: Secondary | ICD-10-CM | POA: Diagnosis not present

## 2022-05-25 DIAGNOSIS — Z7901 Long term (current) use of anticoagulants: Secondary | ICD-10-CM | POA: Diagnosis not present

## 2022-05-25 DIAGNOSIS — Z950 Presence of cardiac pacemaker: Secondary | ICD-10-CM | POA: Diagnosis not present

## 2022-05-25 DIAGNOSIS — E785 Hyperlipidemia, unspecified: Secondary | ICD-10-CM | POA: Diagnosis not present

## 2022-05-25 NOTE — Progress Notes (Signed)
Cardiology Office Note   Date:  05/26/2022   ID:  [name], [name] 10-23-1940, MRN [id number]  PCP:  Junie Spencer, FNP  Cardiologist:   Rollene Rotunda, MD   Chief Complaint  Patient presents with   Cerebrovascular Accident      History of Present Illness: [name] is a 81 y.o. male who presents for follow up of non ischemic cardiomyopathy.  Since I last saw him he had been gaining some fluid and getting more short of breath.  His daughter was trying to get him into see [place] for what she thought might have been heart failure.  In the meantime he had an episode where he just kind of "froze."  He was taken to [facility] and I reviewed these records for this visit.  He had an echocardiogram as demonstrated still well-preserved ejection fraction with moderate mitral regurgitation.  There was moderate pulmonary hypertension.  Head CT demonstrated occlusion of the right M2 branch.  He had been taking Eliquis since the summer when he was found to be in persistent atrial fibrillation but he had missed at least a dose.  He has no residual weakness and some speech disturbances.  He is going to start with physical therapy.  He is walking with a walker.  Prior to that he been doing okay although it still been smoking a third a pack of cigarettes a day and 4-5 beers a week at least.  He has not been having any palpitations, presyncope or syncope.  The swelling and shortness of breath and PND that he was having seems to have resolved.  He was sent home on higher dose of diuretic.  I do note that he was hypokalemic on his last blood draw.  He denies any chest pressure, neck or arm discomfort.  He is not having any new shortness of breath, PND or orthopnea.  About 119 pounds.   Past Medical History:  Diagnosis Date   Anxiety    Arthritis    CAD (coronary artery disease)    Nonobstructive cath 2012.  PCI to RCA 2003   Carotid artery disease (HCC)    Cataract    CHF (congestive  heart failure) (HCC)    COPD (chronic obstructive pulmonary disease) (HCC)    Depression    Dyslipidemia    EtOH dependence (HCC)    GERD (gastroesophageal reflux disease)    Hyperlipidemia    Hypertension    Insomnia    Nonischemic cardiomyopathy (HCC)    EF was 20% (60% last echo 2015)   Pacemaker    and defibrillator   PAF (paroxysmal atrial fibrillation) (HCC)    Peptic ulcer disease    Tobacco abuse     Past Surgical History:  Procedure Laterality Date   BIV ICD GENERTAOR CHANGE OUT N/A 03/06/2014   [employer] Gen change by Dr Johney Frame Lianne Moris CRT-D with LV1 header)   biventricular defibrillator implantation     [employer] and pacemaker   CATARACT EXTRACTION W/PHACO Right 12/10/2015   Procedure: CATARACT EXTRACTION PHACO AND INTRAOCULAR LENS PLACEMENT RIGHT EYE; CDE: 19.30;  Surgeon: Gemma Payor, MD;  Location: [facility];  Service: Ophthalmology;  Laterality: Right;   CATARACT EXTRACTION W/PHACO Left 01/14/2016   Procedure: CATARACT EXTRACTION PHACO AND INTRAOCULAR LENS PLACEMENT LEFT EYE; CDE:  8.03;  Surgeon: Gemma Payor, MD;  Location: [facility];  Service: Ophthalmology;  Laterality: Left;   COLONOSCOPY     EYE SURGERY     Ulcer  surgery     repair of stomach ulcer     Current Outpatient Medications  Medication Sig Dispense Refill   albuterol (VENTOLIN HFA) 108 (90 Base) MCG/ACT inhaler INHALE 1 PUFF BY MOUTH EVERY 6 HOURS AS NEEDED FOR SHORTNESS OF BREATH 9 g 0   ALPRAZolam (XANAX) 1 MG tablet Take 1 tablet (1 mg total) by mouth 2 (two) times daily as needed. Keep appt w/ Neysa Bonito for refills 60 tablet 0   amLODipine (NORVASC) 10 MG tablet Take 1 tablet by mouth once daily 90 tablet 0   cetirizine (ZYRTEC) 10 MG tablet Take 10 mg by mouth daily as needed for allergies.     ELIQUIS 5 MG TABS tablet Take 1 tablet by mouth twice daily 60 tablet 6   Fluticasone-Umeclidin-Vilant (TRELEGY ELLIPTA) 100-62.5-25 MCG/ACT AEPB Inhale 1 puff into the lungs daily. 1 each 11    furosemide (LASIX) 20 MG tablet 40 mg daily for next 5 days, then decrease to 20 mg daily 95 tablet 1   KLOR-CON M20 20 MEQ tablet Take 20 mEq by mouth daily.     lisinopril (ZESTRIL) 40 MG tablet Take 1 tablet by mouth once daily 90 tablet 0   metoprolol tartrate (LOPRESSOR) 100 MG tablet Take 1 tablet by mouth twice daily 180 tablet 0   Multiple Vitamin (MULTIVITAMIN) capsule Take 1 capsule by mouth daily.     pantoprazole (PROTONIX) 40 MG tablet Take 1 tablet by mouth once daily 90 tablet 0   pravastatin (PRAVACHOL) 80 MG tablet Take 1 tablet (80 mg total) by mouth every evening. 90 tablet 3   sodium chloride 1 g tablet Take 1 tablet (1 g total) by mouth 3 (three) times daily with meals. 90 tablet 2   tamsulosin (FLOMAX) 0.4 MG CAPS capsule Take 1 capsule (0.4 mg total) by mouth daily. 90 capsule 3   No current facility-administered medications for this visit.    Allergies:   Gabapentin, Sudafed [pseudoephedrine hcl], Prednisone, and Avelox [moxifloxacin hcl in nacl]    ROS:  Please see the history of present illness.   Otherwise, review of systems are positive for none neuroapthy.     PHYSICAL EXAM: VS:  BP 118/60   Ht 5\' 8"  (1.727 m)   Wt 119 lb (54 kg)   BMI 18.09 kg/m  , BMI Body mass index is 18.09 kg/m. GENERAL:  Well appearing NECK:  No jugular venous distention, waveform within normal limits, carotid upstroke brisk and symmetric, no bruits, no thyromegaly LUNGS:  Clear to auscultation bilaterally CHEST: Well-healed ICD scar HEART:  PMI not displaced or sustained,S1 and S2 within normal limits, no S3, no clicks, no rubs, no murmurs ABD:  Flat, positive bowel sounds normal in frequency in pitch, no bruits, no rebound, no guarding, no midline pulsatile mass, no hepatomegaly, no splenomegaly EXT:  2 plus pulses throughout, no edema, no cyanosis no clubbing   EKG:  EKG is ordered today. The ekg ordered today demonstrates underlying atrial fibrillation, ventricular pacing 100%  capture, rate 70.  There is significant baseline artifact.   Recent Labs: 01/27/2022: BNP 403.8; Hemoglobin 10.4; Platelets 339 05/03/2022: ALT 9; BUN 12; Creatinine, Ser 1.21; Potassium 4.0; Sodium 130    Lipid Panel    Component Value Date/Time   CHOL 190 08/12/2021 0854   TRIG 54 08/12/2021 0854   TRIG CANCELED 01/31/2014 1618   HDL 86 08/12/2021 0854   HDL CANCELED 01/31/2014 1618   CHOLHDL 2.2 08/12/2021 0854   LDLCALC 94 08/12/2021 0854  Wt Readings from Last 3 Encounters:  05/26/22 119 lb (54 kg)  05/24/22 119 lb (54 kg)  05/03/22 132 lb 12.8 oz (60.2 kg)      Other studies Reviewed: Additional studies/ records that were reviewed today include: Device interrogation Review of the above records demonstrates:  Please see elsewhere in the note.     ASSESSMENT AND PLAN:  PAF:    Mr. NAVIAN AGUILA has a CHA2DS2 - VASc score of 5. Probably persistent atrial fibrillation.  He is going to continue his anticoagulation.   VENTRICULAR TACHYCARDIA:     He is up-to-date with follow-up and I reviewed the April 26, 2022 device interrogation for this visit.   CARDIOMOPATHY:  His EF was normal in 2015.  EF was stable and in the 50 to 55% as above.   TOBACCO ABUSE:   He understands the need to stop smoking.  We talked about this multiple times.  DYSLIPIDEMIA:   I will repeat a lipid profile today.    HTN: The blood pressure is at target.    ETOH:   He has not been drinking since he got out of the hospital.  SLEEP APNEA:   He uses BiPAP.  His family stated his mask is not fitting well and I have asked him to get a follow-up with Dr. Richardean Chimera.    ANEMIA:  I will check a ferritin and iron level.  He is not reporting any active bleeding.  MR: I will follow this clinically.  HYPOKALEMIA: I will check a potassium today as above and also magnesium because he was low in the hospital.    Current medicines are reviewed at length with the patient today.  The patient does  not have concerns regarding medicines.  The following changes have been made: As above.   Labs/ tests ordered today include:  None  Orders Placed This Encounter  Procedures   Basic Metabolic Panel (BMET)   Iron, TIBC and Ferritin Panel   Lipid Profile   Magnesium   EKG 12-Lead     Disposition:   FU with me in 3 months.     Signed, Rollene Rotunda, MD  05/26/2022 9:15 AM    [facility]

## 2022-05-26 ENCOUNTER — Ambulatory Visit: Payer: Medicare Other | Attending: Cardiology | Admitting: Cardiology

## 2022-05-26 ENCOUNTER — Encounter: Payer: Self-pay | Admitting: Cardiology

## 2022-05-26 VITALS — BP 118/60 | Ht 68.0 in | Wt 119.0 lb

## 2022-05-26 DIAGNOSIS — E785 Hyperlipidemia, unspecified: Secondary | ICD-10-CM

## 2022-05-26 DIAGNOSIS — I10 Essential (primary) hypertension: Secondary | ICD-10-CM | POA: Diagnosis not present

## 2022-05-26 DIAGNOSIS — I480 Paroxysmal atrial fibrillation: Secondary | ICD-10-CM

## 2022-05-26 DIAGNOSIS — R790 Abnormal level of blood mineral: Secondary | ICD-10-CM

## 2022-05-26 LAB — BASIC METABOLIC PANEL
BUN/Creatinine Ratio: 13 (ref 10–24)
BUN: 21 mg/dL (ref 8–27)
CO2: 23 mmol/L (ref 20–29)
Calcium: 10.1 mg/dL (ref 8.6–10.2)
Chloride: 95 mmol/L — ABNORMAL LOW (ref 96–106)
Creatinine, Ser: 1.63 mg/dL — ABNORMAL HIGH (ref 0.76–1.27)
Glucose: 97 mg/dL (ref 70–99)
Potassium: 4.4 mmol/L (ref 3.5–5.2)
Sodium: 134 mmol/L (ref 134–144)
eGFR: 43 mL/min/{1.73_m2} — ABNORMAL LOW (ref >59)

## 2022-05-26 LAB — IRON,TIBC AND FERRITIN PANEL
Ferritin: 217 ng/mL (ref 30–400)
Iron Saturation: 14 % — ABNORMAL LOW (ref 15–55)
Iron: 52 ug/dL (ref 38–169)
Total Iron Binding Capacity: 377 ug/dL (ref 250–450)
UIBC: 325 ug/dL (ref 111–343)

## 2022-05-26 LAB — MAGNESIUM: Magnesium: 2 mg/dL (ref 1.6–2.3)

## 2022-05-26 LAB — LIPID PANEL
Chol/HDL Ratio: 2.8 ratio (ref 0.0–5.0)
Cholesterol, Total: 154 mg/dL (ref 100–199)
HDL: 55 mg/dL (ref >39)
LDL Chol Calc (NIH): 81 mg/dL (ref 0–99)
Triglycerides: 98 mg/dL (ref 0–149)
VLDL Cholesterol Cal: 18 mg/dL (ref 5–40)

## 2022-05-26 NOTE — Patient Instructions (Signed)
Medication Instructions:  Your physician recommends that you continue on your current medications as directed. Please refer to the Current Medication list given to you today.  *If you need a refill on your cardiac medications before your next appointment, please call your pharmacy*   Lab Work: Your physician recommends that you have the following labs drawn today BMET, IRON, FERRITIN LEVEL, MAGNESIUM and LIPID PROFILE  If you have labs (blood work) drawn today and your tests are completely normal, you will receive your results only by: [place] (if you have MyChart) OR A paper copy in the mail If you have any lab test that is abnormal or we need to change your treatment, we will call you to review the results.   Testing/Procedures: NONE   Follow-Up: At [facility], you and your health needs are our priority.  As part of our continuing mission to provide you with exceptional heart care, we have created designated Provider Care Teams.  These Care Teams include your primary Cardiologist (physician) and Advanced Practice Providers (APPs -  Physician Assistants and Nurse Practitioners) who all work together to provide you with the care you need, when you need it.  We recommend signing up for the patient portal called "MyChart".  Sign up information is provided on this After Visit Summary.  MyChart is used to connect with patients for Virtual Visits (Telemedicine).  Patients are able to view lab/test results, encounter notes, upcoming appointments, etc.  Non-urgent messages can be sent to your provider as well.   To learn more about what you can do with MyChart, go to [email or web address].    Your next appointment:   3 month(s)  The format for your next appointment:   In Person  Provider:   Minus Breeding, MD in the [facility] on a Wednesday

## 2022-05-27 ENCOUNTER — Other Ambulatory Visit: Payer: Self-pay

## 2022-05-27 DIAGNOSIS — Z950 Presence of cardiac pacemaker: Secondary | ICD-10-CM | POA: Diagnosis not present

## 2022-05-27 DIAGNOSIS — I480 Paroxysmal atrial fibrillation: Secondary | ICD-10-CM

## 2022-05-27 DIAGNOSIS — E785 Hyperlipidemia, unspecified: Secondary | ICD-10-CM | POA: Diagnosis not present

## 2022-05-27 DIAGNOSIS — I110 Hypertensive heart disease with heart failure: Secondary | ICD-10-CM | POA: Diagnosis not present

## 2022-05-27 DIAGNOSIS — I509 Heart failure, unspecified: Secondary | ICD-10-CM | POA: Diagnosis not present

## 2022-05-27 DIAGNOSIS — Z7901 Long term (current) use of anticoagulants: Secondary | ICD-10-CM | POA: Diagnosis not present

## 2022-05-27 DIAGNOSIS — I69991 Dysphagia following unspecified cerebrovascular disease: Secondary | ICD-10-CM | POA: Diagnosis not present

## 2022-05-27 DIAGNOSIS — J449 Chronic obstructive pulmonary disease, unspecified: Secondary | ICD-10-CM | POA: Diagnosis not present

## 2022-05-27 DIAGNOSIS — I429 Cardiomyopathy, unspecified: Secondary | ICD-10-CM | POA: Diagnosis not present

## 2022-05-27 DIAGNOSIS — E119 Type 2 diabetes mellitus without complications: Secondary | ICD-10-CM | POA: Diagnosis not present

## 2022-05-28 ENCOUNTER — Other Ambulatory Visit: Payer: Self-pay | Admitting: Family

## 2022-05-28 DIAGNOSIS — I10 Essential (primary) hypertension: Secondary | ICD-10-CM

## 2022-05-30 ENCOUNTER — Other Ambulatory Visit: Payer: Self-pay | Admitting: *Deleted

## 2022-05-30 DIAGNOSIS — I5032 Chronic diastolic (congestive) heart failure: Secondary | ICD-10-CM | POA: Diagnosis not present

## 2022-05-30 DIAGNOSIS — Z515 Encounter for palliative care: Secondary | ICD-10-CM | POA: Diagnosis not present

## 2022-05-30 DIAGNOSIS — I10 Essential (primary) hypertension: Secondary | ICD-10-CM | POA: Diagnosis not present

## 2022-05-30 DIAGNOSIS — I480 Paroxysmal atrial fibrillation: Secondary | ICD-10-CM | POA: Diagnosis not present

## 2022-05-30 DIAGNOSIS — G4733 Obstructive sleep apnea (adult) (pediatric): Secondary | ICD-10-CM | POA: Diagnosis not present

## 2022-05-30 DIAGNOSIS — R063 Periodic breathing: Secondary | ICD-10-CM | POA: Diagnosis not present

## 2022-05-30 DIAGNOSIS — J432 Centrilobular emphysema: Secondary | ICD-10-CM | POA: Diagnosis not present

## 2022-05-31 DIAGNOSIS — E785 Hyperlipidemia, unspecified: Secondary | ICD-10-CM | POA: Diagnosis not present

## 2022-05-31 DIAGNOSIS — I509 Heart failure, unspecified: Secondary | ICD-10-CM | POA: Diagnosis not present

## 2022-05-31 DIAGNOSIS — Z7901 Long term (current) use of anticoagulants: Secondary | ICD-10-CM | POA: Diagnosis not present

## 2022-05-31 DIAGNOSIS — I69991 Dysphagia following unspecified cerebrovascular disease: Secondary | ICD-10-CM | POA: Diagnosis not present

## 2022-05-31 DIAGNOSIS — J449 Chronic obstructive pulmonary disease, unspecified: Secondary | ICD-10-CM | POA: Diagnosis not present

## 2022-05-31 DIAGNOSIS — I480 Paroxysmal atrial fibrillation: Secondary | ICD-10-CM | POA: Diagnosis not present

## 2022-05-31 DIAGNOSIS — I429 Cardiomyopathy, unspecified: Secondary | ICD-10-CM | POA: Diagnosis not present

## 2022-05-31 DIAGNOSIS — Z950 Presence of cardiac pacemaker: Secondary | ICD-10-CM | POA: Diagnosis not present

## 2022-05-31 DIAGNOSIS — E119 Type 2 diabetes mellitus without complications: Secondary | ICD-10-CM | POA: Diagnosis not present

## 2022-05-31 DIAGNOSIS — I110 Hypertensive heart disease with heart failure: Secondary | ICD-10-CM | POA: Diagnosis not present

## 2022-06-01 DIAGNOSIS — E119 Type 2 diabetes mellitus without complications: Secondary | ICD-10-CM | POA: Diagnosis not present

## 2022-06-01 DIAGNOSIS — I69991 Dysphagia following unspecified cerebrovascular disease: Secondary | ICD-10-CM | POA: Diagnosis not present

## 2022-06-01 DIAGNOSIS — I480 Paroxysmal atrial fibrillation: Secondary | ICD-10-CM | POA: Diagnosis not present

## 2022-06-01 DIAGNOSIS — E785 Hyperlipidemia, unspecified: Secondary | ICD-10-CM | POA: Diagnosis not present

## 2022-06-01 DIAGNOSIS — I110 Hypertensive heart disease with heart failure: Secondary | ICD-10-CM | POA: Diagnosis not present

## 2022-06-01 DIAGNOSIS — I509 Heart failure, unspecified: Secondary | ICD-10-CM | POA: Diagnosis not present

## 2022-06-01 DIAGNOSIS — Z950 Presence of cardiac pacemaker: Secondary | ICD-10-CM | POA: Diagnosis not present

## 2022-06-01 DIAGNOSIS — Z7901 Long term (current) use of anticoagulants: Secondary | ICD-10-CM | POA: Diagnosis not present

## 2022-06-01 DIAGNOSIS — J449 Chronic obstructive pulmonary disease, unspecified: Secondary | ICD-10-CM | POA: Diagnosis not present

## 2022-06-01 DIAGNOSIS — I429 Cardiomyopathy, unspecified: Secondary | ICD-10-CM | POA: Diagnosis not present

## 2022-06-02 DIAGNOSIS — I480 Paroxysmal atrial fibrillation: Secondary | ICD-10-CM | POA: Diagnosis not present

## 2022-06-02 DIAGNOSIS — I110 Hypertensive heart disease with heart failure: Secondary | ICD-10-CM | POA: Diagnosis not present

## 2022-06-02 DIAGNOSIS — I509 Heart failure, unspecified: Secondary | ICD-10-CM | POA: Diagnosis not present

## 2022-06-02 DIAGNOSIS — Z950 Presence of cardiac pacemaker: Secondary | ICD-10-CM | POA: Diagnosis not present

## 2022-06-02 DIAGNOSIS — I429 Cardiomyopathy, unspecified: Secondary | ICD-10-CM | POA: Diagnosis not present

## 2022-06-02 DIAGNOSIS — J449 Chronic obstructive pulmonary disease, unspecified: Secondary | ICD-10-CM | POA: Diagnosis not present

## 2022-06-02 DIAGNOSIS — E785 Hyperlipidemia, unspecified: Secondary | ICD-10-CM | POA: Diagnosis not present

## 2022-06-02 DIAGNOSIS — I69991 Dysphagia following unspecified cerebrovascular disease: Secondary | ICD-10-CM | POA: Diagnosis not present

## 2022-06-02 DIAGNOSIS — E119 Type 2 diabetes mellitus without complications: Secondary | ICD-10-CM | POA: Diagnosis not present

## 2022-06-02 DIAGNOSIS — Z7901 Long term (current) use of anticoagulants: Secondary | ICD-10-CM | POA: Diagnosis not present

## 2022-06-03 ENCOUNTER — Ambulatory Visit (INDEPENDENT_AMBULATORY_CARE_PROVIDER_SITE_OTHER): Payer: Medicare Other

## 2022-06-03 DIAGNOSIS — I509 Heart failure, unspecified: Secondary | ICD-10-CM

## 2022-06-03 DIAGNOSIS — Z7901 Long term (current) use of anticoagulants: Secondary | ICD-10-CM

## 2022-06-03 DIAGNOSIS — J449 Chronic obstructive pulmonary disease, unspecified: Secondary | ICD-10-CM

## 2022-06-03 DIAGNOSIS — I429 Cardiomyopathy, unspecified: Secondary | ICD-10-CM | POA: Diagnosis not present

## 2022-06-03 DIAGNOSIS — G4733 Obstructive sleep apnea (adult) (pediatric): Secondary | ICD-10-CM

## 2022-06-03 DIAGNOSIS — I480 Paroxysmal atrial fibrillation: Secondary | ICD-10-CM | POA: Diagnosis not present

## 2022-06-03 DIAGNOSIS — I110 Hypertensive heart disease with heart failure: Secondary | ICD-10-CM

## 2022-06-03 DIAGNOSIS — Z950 Presence of cardiac pacemaker: Secondary | ICD-10-CM

## 2022-06-03 DIAGNOSIS — E785 Hyperlipidemia, unspecified: Secondary | ICD-10-CM

## 2022-06-03 DIAGNOSIS — I69991 Dysphagia following unspecified cerebrovascular disease: Secondary | ICD-10-CM | POA: Diagnosis not present

## 2022-06-03 DIAGNOSIS — E119 Type 2 diabetes mellitus without complications: Secondary | ICD-10-CM | POA: Diagnosis not present

## 2022-06-07 DIAGNOSIS — Z950 Presence of cardiac pacemaker: Secondary | ICD-10-CM | POA: Diagnosis not present

## 2022-06-07 DIAGNOSIS — I69991 Dysphagia following unspecified cerebrovascular disease: Secondary | ICD-10-CM | POA: Diagnosis not present

## 2022-06-07 DIAGNOSIS — E119 Type 2 diabetes mellitus without complications: Secondary | ICD-10-CM | POA: Diagnosis not present

## 2022-06-07 DIAGNOSIS — J449 Chronic obstructive pulmonary disease, unspecified: Secondary | ICD-10-CM | POA: Diagnosis not present

## 2022-06-07 DIAGNOSIS — E785 Hyperlipidemia, unspecified: Secondary | ICD-10-CM | POA: Diagnosis not present

## 2022-06-07 DIAGNOSIS — I429 Cardiomyopathy, unspecified: Secondary | ICD-10-CM | POA: Diagnosis not present

## 2022-06-07 DIAGNOSIS — Z7901 Long term (current) use of anticoagulants: Secondary | ICD-10-CM | POA: Diagnosis not present

## 2022-06-07 DIAGNOSIS — I509 Heart failure, unspecified: Secondary | ICD-10-CM | POA: Diagnosis not present

## 2022-06-07 DIAGNOSIS — I110 Hypertensive heart disease with heart failure: Secondary | ICD-10-CM | POA: Diagnosis not present

## 2022-06-07 DIAGNOSIS — I480 Paroxysmal atrial fibrillation: Secondary | ICD-10-CM | POA: Diagnosis not present

## 2022-06-09 DIAGNOSIS — E785 Hyperlipidemia, unspecified: Secondary | ICD-10-CM | POA: Diagnosis not present

## 2022-06-09 DIAGNOSIS — I509 Heart failure, unspecified: Secondary | ICD-10-CM | POA: Diagnosis not present

## 2022-06-09 DIAGNOSIS — Z950 Presence of cardiac pacemaker: Secondary | ICD-10-CM | POA: Diagnosis not present

## 2022-06-09 DIAGNOSIS — I480 Paroxysmal atrial fibrillation: Secondary | ICD-10-CM | POA: Diagnosis not present

## 2022-06-09 DIAGNOSIS — Z7901 Long term (current) use of anticoagulants: Secondary | ICD-10-CM | POA: Diagnosis not present

## 2022-06-09 DIAGNOSIS — I110 Hypertensive heart disease with heart failure: Secondary | ICD-10-CM | POA: Diagnosis not present

## 2022-06-09 DIAGNOSIS — J449 Chronic obstructive pulmonary disease, unspecified: Secondary | ICD-10-CM | POA: Diagnosis not present

## 2022-06-09 DIAGNOSIS — I429 Cardiomyopathy, unspecified: Secondary | ICD-10-CM | POA: Diagnosis not present

## 2022-06-09 DIAGNOSIS — E119 Type 2 diabetes mellitus without complications: Secondary | ICD-10-CM | POA: Diagnosis not present

## 2022-06-09 DIAGNOSIS — I69991 Dysphagia following unspecified cerebrovascular disease: Secondary | ICD-10-CM | POA: Diagnosis not present

## 2022-06-10 DIAGNOSIS — E785 Hyperlipidemia, unspecified: Secondary | ICD-10-CM | POA: Diagnosis not present

## 2022-06-10 DIAGNOSIS — I69991 Dysphagia following unspecified cerebrovascular disease: Secondary | ICD-10-CM | POA: Diagnosis not present

## 2022-06-10 DIAGNOSIS — I429 Cardiomyopathy, unspecified: Secondary | ICD-10-CM | POA: Diagnosis not present

## 2022-06-10 DIAGNOSIS — Z7901 Long term (current) use of anticoagulants: Secondary | ICD-10-CM | POA: Diagnosis not present

## 2022-06-10 DIAGNOSIS — Z950 Presence of cardiac pacemaker: Secondary | ICD-10-CM | POA: Diagnosis not present

## 2022-06-10 DIAGNOSIS — I110 Hypertensive heart disease with heart failure: Secondary | ICD-10-CM | POA: Diagnosis not present

## 2022-06-10 DIAGNOSIS — I509 Heart failure, unspecified: Secondary | ICD-10-CM | POA: Diagnosis not present

## 2022-06-10 DIAGNOSIS — J449 Chronic obstructive pulmonary disease, unspecified: Secondary | ICD-10-CM | POA: Diagnosis not present

## 2022-06-10 DIAGNOSIS — E119 Type 2 diabetes mellitus without complications: Secondary | ICD-10-CM | POA: Diagnosis not present

## 2022-06-10 DIAGNOSIS — I480 Paroxysmal atrial fibrillation: Secondary | ICD-10-CM | POA: Diagnosis not present

## 2022-06-15 DIAGNOSIS — E119 Type 2 diabetes mellitus without complications: Secondary | ICD-10-CM | POA: Diagnosis not present

## 2022-06-15 DIAGNOSIS — I509 Heart failure, unspecified: Secondary | ICD-10-CM | POA: Diagnosis not present

## 2022-06-15 DIAGNOSIS — I480 Paroxysmal atrial fibrillation: Secondary | ICD-10-CM | POA: Diagnosis not present

## 2022-06-15 DIAGNOSIS — E785 Hyperlipidemia, unspecified: Secondary | ICD-10-CM | POA: Diagnosis not present

## 2022-06-15 DIAGNOSIS — I110 Hypertensive heart disease with heart failure: Secondary | ICD-10-CM | POA: Diagnosis not present

## 2022-06-15 DIAGNOSIS — Z950 Presence of cardiac pacemaker: Secondary | ICD-10-CM | POA: Diagnosis not present

## 2022-06-15 DIAGNOSIS — I69991 Dysphagia following unspecified cerebrovascular disease: Secondary | ICD-10-CM | POA: Diagnosis not present

## 2022-06-15 DIAGNOSIS — Z7901 Long term (current) use of anticoagulants: Secondary | ICD-10-CM | POA: Diagnosis not present

## 2022-06-15 DIAGNOSIS — J449 Chronic obstructive pulmonary disease, unspecified: Secondary | ICD-10-CM | POA: Diagnosis not present

## 2022-06-15 DIAGNOSIS — I429 Cardiomyopathy, unspecified: Secondary | ICD-10-CM | POA: Diagnosis not present

## 2022-06-16 ENCOUNTER — Encounter: Payer: Self-pay | Admitting: Family

## 2022-06-16 ENCOUNTER — Ambulatory Visit (INDEPENDENT_AMBULATORY_CARE_PROVIDER_SITE_OTHER): Payer: Medicare Other | Admitting: Family

## 2022-06-16 VITALS — BP 105/61 | HR 67 | Temp 96.5°F | Ht 68.0 in | Wt 123.0 lb

## 2022-06-16 DIAGNOSIS — I25119 Atherosclerotic heart disease of native coronary artery with unspecified angina pectoris: Secondary | ICD-10-CM

## 2022-06-16 DIAGNOSIS — Z87891 Personal history of nicotine dependence: Secondary | ICD-10-CM

## 2022-06-16 DIAGNOSIS — I10 Essential (primary) hypertension: Secondary | ICD-10-CM | POA: Diagnosis not present

## 2022-06-16 DIAGNOSIS — B009 Herpesviral infection, unspecified: Secondary | ICD-10-CM

## 2022-06-16 DIAGNOSIS — R350 Frequency of micturition: Secondary | ICD-10-CM | POA: Diagnosis not present

## 2022-06-16 DIAGNOSIS — K219 Gastro-esophageal reflux disease without esophagitis: Secondary | ICD-10-CM

## 2022-06-16 DIAGNOSIS — F1011 Alcohol abuse, in remission: Secondary | ICD-10-CM

## 2022-06-16 DIAGNOSIS — N401 Enlarged prostate with lower urinary tract symptoms: Secondary | ICD-10-CM | POA: Diagnosis not present

## 2022-06-16 DIAGNOSIS — F411 Generalized anxiety disorder: Secondary | ICD-10-CM

## 2022-06-16 DIAGNOSIS — Z8673 Personal history of transient ischemic attack (TIA), and cerebral infarction without residual deficits: Secondary | ICD-10-CM | POA: Diagnosis not present

## 2022-06-16 DIAGNOSIS — E785 Hyperlipidemia, unspecified: Secondary | ICD-10-CM

## 2022-06-16 DIAGNOSIS — I5022 Chronic systolic (congestive) heart failure: Secondary | ICD-10-CM | POA: Diagnosis not present

## 2022-06-16 DIAGNOSIS — G621 Alcoholic polyneuropathy: Secondary | ICD-10-CM

## 2022-06-16 DIAGNOSIS — I700 Atherosclerosis of aorta: Secondary | ICD-10-CM

## 2022-06-16 DIAGNOSIS — F1320 Sedative, hypnotic or anxiolytic dependence, uncomplicated: Secondary | ICD-10-CM

## 2022-06-16 DIAGNOSIS — G4700 Insomnia, unspecified: Secondary | ICD-10-CM

## 2022-06-16 DIAGNOSIS — D6869 Other thrombophilia: Secondary | ICD-10-CM

## 2022-06-16 DIAGNOSIS — Z79899 Other long term (current) drug therapy: Secondary | ICD-10-CM

## 2022-06-16 DIAGNOSIS — G4733 Obstructive sleep apnea (adult) (pediatric): Secondary | ICD-10-CM

## 2022-06-16 DIAGNOSIS — L409 Psoriasis, unspecified: Secondary | ICD-10-CM

## 2022-06-16 DIAGNOSIS — J432 Centrilobular emphysema: Secondary | ICD-10-CM

## 2022-06-16 DIAGNOSIS — I6930 Unspecified sequelae of cerebral infarction: Secondary | ICD-10-CM

## 2022-06-16 MED ORDER — VALACYCLOVIR HCL 1 G PO TABS: 2000.0000 mg | ORAL_TABLET | Freq: Two times a day (BID) | ORAL | 3 refills | Status: AC

## 2022-06-16 MED ORDER — ALPRAZOLAM 1 MG PO TABS: 1.0000 mg | ORAL_TABLET | Freq: Two times a day (BID) | ORAL | 0 refills | Status: DC | PRN

## 2022-06-16 MED ORDER — PREGABALIN 50 MG PO CAPS: 50.0000 mg | ORAL_CAPSULE | Freq: Two times a day (BID) | ORAL | 2 refills | Status: DC

## 2022-06-16 NOTE — Addendum Note (Signed)
Addended by: Brynda Peon F on: 06/16/2022 12:35 PM   Modules accepted: Orders

## 2022-06-16 NOTE — Progress Notes (Signed)
Subjective:    Patient ID: [name], male    DOB: 02-28-1941, 81 y.o.   MRN: [id number]  Chief Complaint  Patient presents with   Follow-up    Pt presents to the office today for chronic follow up. PT is followed by Cardiologists for CAD, CHF, Atherosclerosis, Cardiomyopathy, and Ventricular tachycardia every 2 years.  PT has pacemaker and is followed annually for this.     Pt is followed by Dermatologists for psoriasis as needed.     Has OSA and uses CPAP nightly.    He  reports he quit drinking 05/13/22. He reports constant burning and aching pain of bilateral feet from alocholic peripheral neuropathy that is 7 out 10. He has taken Lyric without relief.    He has aortic atheroscleroses and takes pravastatin daily.  He had a CVA in 05/13/22, with mild left sided weakness. He is doing PT.    He has emphysema. He quit smoking 05/13/22. Denies any SOB. Hypertension This is a chronic problem. The current episode started more than 1 year ago. The problem has been resolved since onset. The problem is controlled. Associated symptoms include anxiety and malaise/fatigue. Pertinent negatives include no peripheral edema, shortness of breath or sweats. Risk factors for coronary artery disease include dyslipidemia, male gender and sedentary lifestyle. The current treatment provides moderate improvement. There is no history of CVA or heart failure.  Congestive Heart Failure Presents for follow-up visit. Associated symptoms include edema and nocturia (1). Pertinent negatives include no shortness of breath. The symptoms have been stable.  Gastroesophageal Reflux He complains of belching, heartburn and a hoarse voice. This is a chronic problem. The current episode started more than 1 year ago. The problem occurs occasionally. He has tried a PPI for the symptoms. The treatment provided moderate relief.  Insomnia Primary symptoms: difficulty falling asleep, frequent awakening, malaise/fatigue.    The current episode started more than one year. The onset quality is gradual. The problem occurs intermittently. Past treatments include medication. The treatment provided moderate relief.  Hyperlipidemia This is a chronic problem. The current episode started more than 1 year ago. The problem is controlled. Pertinent negatives include no shortness of breath. Current antihyperlipidemic treatment includes statins. The current treatment provides moderate improvement of lipids. Risk factors for coronary artery disease include dyslipidemia, hypertension, male sex, a sedentary lifestyle and post-menopausal.  Anxiety Presents for follow-up visit. Symptoms include excessive worry, insomnia, irritability, nervous/anxious behavior and restlessness. Patient reports no shortness of breath. Symptoms occur occasionally. The severity of symptoms is mild.    Benign Prostatic Hypertrophy This is a chronic problem. The current episode started more than 1 year ago. Irritative symptoms include nocturia (1).      Review of Systems  Constitutional:  Positive for irritability and malaise/fatigue.  HENT:  Positive for hoarse voice.   Respiratory:  Negative for shortness of breath.   Gastrointestinal:  Positive for heartburn.  Genitourinary:  Positive for nocturia (1).  Psychiatric/Behavioral:  The patient is nervous/anxious and has insomnia.   All other systems reviewed and are negative.      Objective:   Physical Exam Vitals reviewed.  Constitutional:      General: He is not in acute distress.    Appearance: He is well-developed.  HENT:     Head: Normocephalic.     Right Ear: Tympanic membrane normal.     Left Ear: Tympanic membrane normal.  Eyes:     General:        Right  eye: No discharge.        Left eye: No discharge.     Pupils: Pupils are equal, round, and reactive to light.  Neck:     Thyroid: No thyromegaly.  Cardiovascular:     Rate and Rhythm: Normal rate and regular rhythm.     Heart  sounds: Normal heart sounds. No murmur heard. Pulmonary:     Effort: Pulmonary effort is normal. No respiratory distress.     Breath sounds: Normal breath sounds. No wheezing.  Abdominal:     General: Bowel sounds are normal. There is no distension.     Palpations: Abdomen is soft.     Tenderness: There is no abdominal tenderness.  Musculoskeletal:        General: No tenderness. Normal range of motion.     Cervical back: Normal range of motion and neck supple.  Skin:    General: Skin is warm and dry.     Findings: No erythema or rash.  Neurological:     Mental Status: He is alert and oriented to person, place, and time.     Cranial Nerves: No cranial nerve deficit.     Motor: Weakness present.     Gait: Gait abnormal (using cane).     Deep Tendon Reflexes: Reflexes are normal and symmetric.  Psychiatric:        Behavior: Behavior normal.        Thought Content: Thought content normal.        Judgment: Judgment normal.       BP 105/61   Pulse 67   Temp (!) 96.5 F (35.8 C) (Temporal)   Ht _0  (1.727 m)   Wt 123 lb (55.8 kg)   SpO2 93%   BMI 18.70 kg/m      Assessment & Plan:  [name] comes in today with chief complaint of Follow-up   Diagnosis and orders addressed:  1. Aortic atherosclerosis (HCC) - CMP14+EGFR - CBC with Differential/Platelet  2. Benign prostatic hyperplasia with urinary frequency - CMP14+EGFR - CBC with Differential/Platelet  3. Benzodiazepine dependence (HCC) - CMP14+EGFR - CBC with Differential/Platelet  4. Chronic systolic heart failure (HCC) - CMP14+EGFR - CBC with Differential/Platelet  5. Controlled substance agreement signed - CMP14+EGFR - CBC with Differential/Platelet  6. Atherosclerosis of native coronary artery with angina pectoris, unspecified whether native or transplanted heart (HCC) - CMP14+EGFR - CBC with Differential/Platelet  7. Alcohol abuse, in remission - CMP14+EGFR - CBC with  Differential/Platelet  8. Alcoholic peripheral neuropathy (HCC) Start Lyrica 23m BID - CMP14+EGFR - CBC with Differential/Platelet - pregabalin (LYRICA) 50 MG capsule; Take 1 capsule (50 mg total) by mouth 2 (two) times daily.  Dispense: 60 capsule; Refill: 2  9. Essential hypertension - CMP14+EGFR - CBC with Differential/Platelet  10. GAD (generalized anxiety disorder) - CMP14+EGFR - CBC with Differential/Platelet - ALPRAZolam (XANAX) 1 MG tablet; Take 1 tablet (1 mg total) by mouth 2 (two) times daily as needed. Keep appt w/ CAlyse Lowfor refills  Dispense: 60 tablet; Refill: 0  11. Gastroesophageal reflux disease, unspecified whether esophagitis present - CMP14+EGFR - CBC with Differential/Platelet  12. History of CVA (cerebrovascular accident) - CMP14+EGFR - CBC with Differential/Platelet  13. Hyperlipidemia, unspecified hyperlipidemia type - CMP14+EGFR - CBC with Differential/Platelet  14. Insomnia, unspecified type - CMP14+EGFR - CBC with Differential/Platelet  15. OSA (obstructive sleep apnea) - CMP14+EGFR - CBC with Differential/Platelet  16. Psoriasis - CMP14+EGFR - CBC with Differential/Platelet  17. History of smoking - CMP14+EGFR -  CBC with Differential/Platelet  18. Thoracic aorta atherosclerosis (HCC) - CMP14+EGFR - CBC with Differential/Platelet  19. Secondary hypercoagulable state ([place]) - CMP14+EGFR - CBC with Differential/Platelet  20. Late effect of cerebrovascular accident (CVA) - CMP14+EGFR - CBC with Differential/Platelet  21. Centrilobular emphysema (HCC) - CMP14+EGFR - CBC with Differential/Platelet  22. Herpes simplex type 1 infection Start Valtrex 2000 mg BID  - valACYclovir (VALTREX) 1000 MG tablet; Take 2 tablets (2,000 mg total) by mouth 2 (two) times daily for 4 days.  Dispense: 4 tablet; Refill: 3   Labs pending Patient reviewed in [place] controlled database, no flags noted. Contract and drug screen are up to date.  Health  Maintenance reviewed Diet and exercise encouraged  Follow up plan: 3 months   Evelina Dun, FNP

## 2022-06-16 NOTE — Patient Instructions (Signed)
Peripheral Neuropathy Peripheral neuropathy is a type of nerve damage. It affects nerves that carry signals between the spinal cord and the arms, legs, and the rest of the body (peripheral nerves). It does not affect nerves in the spinal cord or brain. In peripheral neuropathy, one nerve or a group of nerves may be damaged. Peripheral neuropathy is a broad category that includes many specific nerve disorders, like diabetic neuropathy, hereditary neuropathy, and carpal tunnel syndrome. What are the causes? This condition may be caused by: Certain diseases, such as: Diabetes. This is the most common cause of peripheral neuropathy. Autoimmune diseases, such as rheumatoid arthritis and systemic lupus erythematosus. Nerve diseases that are passed from parent to child (inherited). Kidney disease. Thyroid disease. Other causes may include: Nerve injury. Pressure or stress on a nerve that lasts a long time. Lack (deficiency) of B vitamins. This can result from alcoholism, poor diet, or a restricted diet. Infections. Some medicines, such as cancer medicines (chemotherapy). Poisonous (toxic) substances, such as lead and mercury. Too little blood flowing to the legs. In some cases, the cause of this condition is not known. What are the signs or symptoms? Symptoms of this condition depend on which of your nerves is damaged. Symptoms in the legs, hands, and arms can include: Loss of feeling (numbness) in the feet, hands, or both. Tingling in the feet, hands, or both. Burning pain. Very sensitive skin. Weakness. Not being able to move a part of the body (paralysis). Clumsiness or poor coordination. Muscle twitching. Loss of balance. Symptoms in other parts of the body can include: Not being able to control your bladder. Feeling dizzy. Sexual problems. How is this diagnosed? Diagnosing and finding the cause of peripheral neuropathy can be difficult. Your health care provider will take your  medical history and do a physical exam. A neurological exam will also be done. This involves checking things that are affected by your brain, spinal cord, and nerves (nervous system). For example, your health care provider will check your reflexes, how you move, and what you can feel. You may have other tests, such as: Blood tests. Electromyogram (EMG) and nerve conduction tests. These tests check nerve function and how well the nerves are controlling the muscles. Imaging tests, such as a CT scan or MRI, to rule out other causes of your symptoms. Removing a small piece of nerve to be examined in a lab (nerve biopsy). Removing and examining a small amount of the fluid that surrounds the brain and spinal cord (lumbar puncture). How is this treated? Treatment for this condition may involve: Treating the underlying cause of the neuropathy, such as diabetes, kidney disease, or vitamin deficiencies. Stopping medicines that can cause neuropathy, such as chemotherapy. Medicine to help relieve pain. Medicines may include: Prescription or over-the-counter pain medicine. Anti-seizure medicine. Antidepressants. Pain-relieving patches that are applied to painful areas of skin. Surgery to relieve pressure on a nerve or to destroy a nerve that is causing pain. Physical therapy to help improve movement and balance. Devices to help you move around (assistive devices). Follow these instructions at home: Medicines Take over-the-counter and prescription medicines only as told by your health care provider. Do not take any other medicines without first asking your health care provider. Ask your health care provider if the medicine prescribed to you requires you to avoid driving or using machinery. Lifestyle  Do not use any products that contain nicotine or tobacco. These products include cigarettes, chewing tobacco, and vaping devices, such as e-cigarettes. Smoking keeps   blood from reaching damaged nerves. If you  need help quitting, ask your health care provider. Avoid or limit alcohol. Too much alcohol can cause a vitamin B deficiency, and vitamin B is needed for healthy nerves. Eat a healthy diet. This includes: Eating foods that are high in fiber, such as beans, whole grains, and fresh fruits and vegetables. Limiting foods that are high in fat and processed sugars, such as fried or sweet foods. General instructions  If you have diabetes, work closely with your health care provider to keep your blood sugar under control. If you have numbness in your feet: Check every day for signs of injury or infection. Watch for redness, warmth, and swelling. Wear padded socks and comfortable shoes. These help protect your feet. Develop a good support system. Living with peripheral neuropathy can be stressful. Consider talking with a mental health specialist or joining a support group. Use assistive devices and attend physical therapy as told by your health care provider. This may include using a walker or a cane. Keep all follow-up visits. This is important. Where to find more information National Institute of Neurological Disorders: www.ninds.nih.gov Contact a health care provider if: You have new signs or symptoms of peripheral neuropathy. You are struggling emotionally from dealing with peripheral neuropathy. Your pain is not well controlled. Get help right away if: You have an injury or infection that is not healing normally. You develop new weakness in an arm or leg. You have fallen or do so frequently. Summary Peripheral neuropathy is when the nerves in the arms or legs are damaged, resulting in numbness, weakness, or pain. There are many causes of peripheral neuropathy, including diabetes, pinched nerves, vitamin deficiencies, autoimmune disease, and hereditary conditions. Diagnosing and finding the cause of peripheral neuropathy can be difficult. Your health care provider will take your medical  history, do a physical exam, and do tests, including blood tests and nerve function tests. Treatment involves treating the underlying cause of the neuropathy and taking medicines to help control pain. Physical therapy and assistive devices may also help. This information is not intended to replace advice given to you by your health care provider. Make sure you discuss any questions you have with your health care provider. Document Revised: 02/09/2021 Document Reviewed: 02/09/2021 Elsevier Patient Education  2023 [employer]  

## 2022-06-17 DIAGNOSIS — E785 Hyperlipidemia, unspecified: Secondary | ICD-10-CM | POA: Diagnosis not present

## 2022-06-17 DIAGNOSIS — Z950 Presence of cardiac pacemaker: Secondary | ICD-10-CM | POA: Diagnosis not present

## 2022-06-17 DIAGNOSIS — J449 Chronic obstructive pulmonary disease, unspecified: Secondary | ICD-10-CM | POA: Diagnosis not present

## 2022-06-17 DIAGNOSIS — E119 Type 2 diabetes mellitus without complications: Secondary | ICD-10-CM | POA: Diagnosis not present

## 2022-06-17 DIAGNOSIS — Z7901 Long term (current) use of anticoagulants: Secondary | ICD-10-CM | POA: Diagnosis not present

## 2022-06-17 DIAGNOSIS — I480 Paroxysmal atrial fibrillation: Secondary | ICD-10-CM | POA: Diagnosis not present

## 2022-06-17 DIAGNOSIS — I429 Cardiomyopathy, unspecified: Secondary | ICD-10-CM | POA: Diagnosis not present

## 2022-06-17 DIAGNOSIS — I69991 Dysphagia following unspecified cerebrovascular disease: Secondary | ICD-10-CM | POA: Diagnosis not present

## 2022-06-17 DIAGNOSIS — I509 Heart failure, unspecified: Secondary | ICD-10-CM | POA: Diagnosis not present

## 2022-06-17 DIAGNOSIS — I110 Hypertensive heart disease with heart failure: Secondary | ICD-10-CM | POA: Diagnosis not present

## 2022-06-17 LAB — CMP14+EGFR
ALT: 20 IU/L (ref 0–44)
AST: 27 IU/L (ref 0–40)
Albumin/Globulin Ratio: 1.5 (ref 1.2–2.2)
Albumin: 4.2 g/dL (ref 3.8–4.8)
Alkaline Phosphatase: 93 IU/L (ref 44–121)
BUN/Creatinine Ratio: 19 (ref 10–24)
BUN: 26 mg/dL (ref 8–27)
Bilirubin Total: 0.5 mg/dL (ref 0.0–1.2)
CO2: 22 mmol/L (ref 20–29)
Calcium: 9.5 mg/dL (ref 8.6–10.2)
Chloride: 95 mmol/L — ABNORMAL LOW (ref 96–106)
Creatinine, Ser: 1.38 mg/dL — ABNORMAL HIGH (ref 0.76–1.27)
Globulin, Total: 2.8 g/dL (ref 1.5–4.5)
Glucose: 79 mg/dL (ref 70–99)
Potassium: 4.7 mmol/L (ref 3.5–5.2)
Sodium: 130 mmol/L — ABNORMAL LOW (ref 134–144)
Total Protein: 7 g/dL (ref 6.0–8.5)
eGFR: 52 mL/min/{1.73_m2} — ABNORMAL LOW (ref >59)

## 2022-06-17 LAB — CBC WITH DIFFERENTIAL/PLATELET
Basophils Absolute: 0.1 10*3/uL (ref 0.0–0.2)
Basos: 1 %
EOS (ABSOLUTE): 0.2 10*3/uL (ref 0.0–0.4)
Eos: 4 %
Hematocrit: 29.4 % — ABNORMAL LOW (ref 37.5–51.0)
Hemoglobin: 9.6 g/dL — ABNORMAL LOW (ref 13.0–17.7)
Immature Grans (Abs): 0 10*3/uL (ref 0.0–0.1)
Immature Granulocytes: 0 %
Lymphocytes Absolute: 1.8 10*3/uL (ref 0.7–3.1)
Lymphs: 30 %
MCH: 30.6 pg (ref 26.6–33.0)
MCHC: 32.7 g/dL (ref 31.5–35.7)
MCV: 94 fL (ref 79–97)
Monocytes Absolute: 0.8 10*3/uL (ref 0.1–0.9)
Monocytes: 13 %
Neutrophils Absolute: 3.2 10*3/uL (ref 1.4–7.0)
Neutrophils: 52 %
Platelets: 247 10*3/uL (ref 150–450)
RBC: 3.14 x10E6/uL — ABNORMAL LOW (ref 4.14–5.80)
RDW: 15.7 % — ABNORMAL HIGH (ref 11.6–15.4)
WBC: 6.1 10*3/uL (ref 3.4–10.8)

## 2022-06-18 LAB — TOXASSURE SELECT 13 (MW), URINE

## 2022-06-21 DIAGNOSIS — E785 Hyperlipidemia, unspecified: Secondary | ICD-10-CM | POA: Diagnosis not present

## 2022-06-21 DIAGNOSIS — E119 Type 2 diabetes mellitus without complications: Secondary | ICD-10-CM | POA: Diagnosis not present

## 2022-06-21 DIAGNOSIS — I480 Paroxysmal atrial fibrillation: Secondary | ICD-10-CM | POA: Diagnosis not present

## 2022-06-21 DIAGNOSIS — Z950 Presence of cardiac pacemaker: Secondary | ICD-10-CM | POA: Diagnosis not present

## 2022-06-21 DIAGNOSIS — I509 Heart failure, unspecified: Secondary | ICD-10-CM | POA: Diagnosis not present

## 2022-06-21 DIAGNOSIS — J449 Chronic obstructive pulmonary disease, unspecified: Secondary | ICD-10-CM | POA: Diagnosis not present

## 2022-06-21 DIAGNOSIS — I429 Cardiomyopathy, unspecified: Secondary | ICD-10-CM | POA: Diagnosis not present

## 2022-06-21 DIAGNOSIS — I69991 Dysphagia following unspecified cerebrovascular disease: Secondary | ICD-10-CM | POA: Diagnosis not present

## 2022-06-21 DIAGNOSIS — I110 Hypertensive heart disease with heart failure: Secondary | ICD-10-CM | POA: Diagnosis not present

## 2022-06-21 DIAGNOSIS — Z7901 Long term (current) use of anticoagulants: Secondary | ICD-10-CM | POA: Diagnosis not present

## 2022-06-23 MED ORDER — KLOR-CON M20 20 MEQ PO TBCR: 20.0000 meq | EXTENDED_RELEASE_TABLET | Freq: Every day | ORAL | 1 refills | Status: DC

## 2022-06-24 DIAGNOSIS — I429 Cardiomyopathy, unspecified: Secondary | ICD-10-CM | POA: Diagnosis not present

## 2022-06-24 DIAGNOSIS — J449 Chronic obstructive pulmonary disease, unspecified: Secondary | ICD-10-CM | POA: Diagnosis not present

## 2022-06-24 DIAGNOSIS — I110 Hypertensive heart disease with heart failure: Secondary | ICD-10-CM | POA: Diagnosis not present

## 2022-06-24 DIAGNOSIS — I509 Heart failure, unspecified: Secondary | ICD-10-CM | POA: Diagnosis not present

## 2022-06-24 DIAGNOSIS — Z950 Presence of cardiac pacemaker: Secondary | ICD-10-CM | POA: Diagnosis not present

## 2022-06-24 DIAGNOSIS — E119 Type 2 diabetes mellitus without complications: Secondary | ICD-10-CM | POA: Diagnosis not present

## 2022-06-24 DIAGNOSIS — I480 Paroxysmal atrial fibrillation: Secondary | ICD-10-CM | POA: Diagnosis not present

## 2022-06-24 DIAGNOSIS — I69991 Dysphagia following unspecified cerebrovascular disease: Secondary | ICD-10-CM | POA: Diagnosis not present

## 2022-06-24 DIAGNOSIS — E785 Hyperlipidemia, unspecified: Secondary | ICD-10-CM | POA: Diagnosis not present

## 2022-06-24 DIAGNOSIS — Z7901 Long term (current) use of anticoagulants: Secondary | ICD-10-CM | POA: Diagnosis not present

## 2022-06-28 ENCOUNTER — Encounter: Payer: Self-pay | Admitting: *Deleted

## 2022-07-07 DIAGNOSIS — Z515 Encounter for palliative care: Secondary | ICD-10-CM | POA: Diagnosis not present

## 2022-07-07 DIAGNOSIS — J432 Centrilobular emphysema: Secondary | ICD-10-CM | POA: Diagnosis not present

## 2022-07-07 DIAGNOSIS — I480 Paroxysmal atrial fibrillation: Secondary | ICD-10-CM | POA: Diagnosis not present

## 2022-07-07 DIAGNOSIS — I10 Essential (primary) hypertension: Secondary | ICD-10-CM | POA: Diagnosis not present

## 2022-07-07 DIAGNOSIS — M479 Spondylosis, unspecified: Secondary | ICD-10-CM | POA: Diagnosis not present

## 2022-07-07 DIAGNOSIS — I5032 Chronic diastolic (congestive) heart failure: Secondary | ICD-10-CM | POA: Diagnosis not present

## 2022-07-11 DIAGNOSIS — G4737 Central sleep apnea in conditions classified elsewhere: Secondary | ICD-10-CM | POA: Diagnosis not present

## 2022-07-11 DIAGNOSIS — R1032 Left lower quadrant pain: Secondary | ICD-10-CM | POA: Diagnosis not present

## 2022-07-11 DIAGNOSIS — K828 Other specified diseases of gallbladder: Secondary | ICD-10-CM | POA: Diagnosis not present

## 2022-07-11 DIAGNOSIS — R063 Periodic breathing: Secondary | ICD-10-CM | POA: Diagnosis not present

## 2022-07-11 DIAGNOSIS — N3289 Other specified disorders of bladder: Secondary | ICD-10-CM | POA: Diagnosis not present

## 2022-07-11 DIAGNOSIS — K5732 Diverticulitis of large intestine without perforation or abscess without bleeding: Secondary | ICD-10-CM | POA: Diagnosis not present

## 2022-07-11 DIAGNOSIS — K5730 Diverticulosis of large intestine without perforation or abscess without bleeding: Secondary | ICD-10-CM | POA: Diagnosis not present

## 2022-07-11 DIAGNOSIS — R079 Chest pain, unspecified: Secondary | ICD-10-CM | POA: Diagnosis not present

## 2022-07-11 DIAGNOSIS — N289 Disorder of kidney and ureter, unspecified: Secondary | ICD-10-CM | POA: Diagnosis not present

## 2022-07-11 DIAGNOSIS — K5792 Diverticulitis of intestine, part unspecified, without perforation or abscess without bleeding: Secondary | ICD-10-CM | POA: Diagnosis not present

## 2022-07-11 DIAGNOSIS — Z888 Allergy status to other drugs, medicaments and biological substances status: Secondary | ICD-10-CM | POA: Diagnosis not present

## 2022-07-11 DIAGNOSIS — R9431 Abnormal electrocardiogram [ECG] [EKG]: Secondary | ICD-10-CM | POA: Diagnosis not present

## 2022-07-14 ENCOUNTER — Encounter: Payer: Self-pay | Admitting: Cardiology

## 2022-07-16 ENCOUNTER — Other Ambulatory Visit (HOSPITAL_COMMUNITY): Payer: Self-pay | Admitting: Nurse Practitioner

## 2022-07-16 DIAGNOSIS — I480 Paroxysmal atrial fibrillation: Secondary | ICD-10-CM

## 2022-07-18 ENCOUNTER — Ambulatory Visit (INDEPENDENT_AMBULATORY_CARE_PROVIDER_SITE_OTHER): Payer: Medicare Other | Admitting: Family

## 2022-07-18 ENCOUNTER — Encounter: Payer: Self-pay | Admitting: Family

## 2022-07-18 VITALS — BP 101/56 | HR 65 | Temp 98.1°F | Ht 68.0 in | Wt 140.0 lb

## 2022-07-18 DIAGNOSIS — Z09 Encounter for follow-up examination after completed treatment for conditions other than malignant neoplasm: Secondary | ICD-10-CM | POA: Diagnosis not present

## 2022-07-18 DIAGNOSIS — J432 Centrilobular emphysema: Secondary | ICD-10-CM

## 2022-07-18 DIAGNOSIS — I5022 Chronic systolic (congestive) heart failure: Secondary | ICD-10-CM

## 2022-07-18 DIAGNOSIS — I6930 Unspecified sequelae of cerebral infarction: Secondary | ICD-10-CM

## 2022-07-18 DIAGNOSIS — I110 Hypertensive heart disease with heart failure: Secondary | ICD-10-CM | POA: Diagnosis not present

## 2022-07-18 DIAGNOSIS — E785 Hyperlipidemia, unspecified: Secondary | ICD-10-CM | POA: Diagnosis not present

## 2022-07-18 DIAGNOSIS — G621 Alcoholic polyneuropathy: Secondary | ICD-10-CM | POA: Diagnosis not present

## 2022-07-18 DIAGNOSIS — I10 Essential (primary) hypertension: Secondary | ICD-10-CM | POA: Diagnosis not present

## 2022-07-18 DIAGNOSIS — I700 Atherosclerosis of aorta: Secondary | ICD-10-CM | POA: Diagnosis not present

## 2022-07-18 DIAGNOSIS — F411 Generalized anxiety disorder: Secondary | ICD-10-CM

## 2022-07-18 DIAGNOSIS — Z23 Encounter for immunization: Secondary | ICD-10-CM

## 2022-07-18 NOTE — Progress Notes (Signed)
Subjective:    Patient ID: [name], male    DOB: 06-Jun-1941, 82 y.o.   MRN: [id number]  Chief Complaint  Patient presents with   Medical Management of Chronic Issues    Weight gain, fluid build up   Pt presents to the office today for chronic follow up. PT is followed by Cardiologists for CAD, CHF, Atherosclerosis, Cardiomyopathy, and Ventricular tachycardia every 2 years.  PT has pacemaker and is followed annually for this.     Pt is followed by Dermatologists for psoriasis as needed.     Has OSA and uses CPAP nightly.    He  reports he quit drinking 05/13/22. He reports constant burning and aching pain of bilateral feet from alocholic peripheral neuropathy that is 2 out 10. The Lyrica 50 mg BID has greatly helped.    He has aortic atheroscleroses and takes pravastatin daily.   He had a CVA in 05/13/22, with mild left sided weakness. He is doing PT.    He has emphysema. He quit smoking 05/13/22. Denies any SOB.  Complaining of left ingrown toenail of left great toenail.   He also went to the ED on 07/11/22 for abdominal pain and increased fluid. He was diagnosed with diverticulitis. He was given Augmentin that he has completed.   His CT abdomen showed,"1.  Acute diverticulitis affecting a short segment of the sigmoid colon. No evidence of perforation or abscess formation.  2.  Trace fluid fluid within the abdomen, likely reactive.  3.  Hypoattenuating lesion within the right kidney, not definitively reflecting a renal cyst. Recommend nonemergent further evaluation with renal ultrasound.  4.  Small right pleural effusion with adjacent atelectasis.  5.  Severe atherosclerosis. "  He has taken his Lasix  80 mg for the last three days then resumed 40 mg today.      07/18/2022   11:45 AM 06/16/2022   11:47 AM 05/26/2022    8:18 AM  Last 3 Weights  Weight (lbs) 140 lb 123 lb 119 lb  Weight (kg) 63.504 kg 55.792 kg 53.978 kg     Congestive Heart Failure Presents for  follow-up visit. Associated symptoms include edema and fatigue. The symptoms have been stable.  Anxiety Presents for follow-up visit. Symptoms include decreased concentration, excessive worry and nervous/anxious behavior. Symptoms occur most days. The severity of symptoms is moderate.        Review of Systems  Constitutional:  Positive for fatigue.  Psychiatric/Behavioral:  Positive for decreased concentration. The patient is nervous/anxious.   All other systems reviewed and are negative.      Objective:   Physical Exam Vitals reviewed.  Constitutional:      General: He is not in acute distress.    Appearance: He is well-developed.  HENT:     Head: Normocephalic.     Right Ear: Tympanic membrane normal.     Left Ear: Tympanic membrane normal.  Eyes:     General:        Right eye: No discharge.        Left eye: No discharge.     Pupils: Pupils are equal, round, and reactive to light.  Neck:     Thyroid: No thyromegaly.  Cardiovascular:     Rate and Rhythm: Normal rate and regular rhythm.     Heart sounds: Normal heart sounds. No murmur heard. Pulmonary:     Effort: Pulmonary effort is normal. No respiratory distress.     Breath sounds: Normal breath sounds. No  wheezing.  Abdominal:     General: Bowel sounds are normal. There is no distension.     Palpations: Abdomen is soft.     Tenderness: There is no abdominal tenderness.  Musculoskeletal:        General: No tenderness. Normal range of motion.     Cervical back: Normal range of motion and neck supple.     Right lower leg: Edema (trace) present.     Left lower leg: Edema (trace) present.  Skin:    General: Skin is warm and dry.     Findings: No erythema or rash.  Neurological:     Mental Status: He is alert and oriented to person, place, and time.     Cranial Nerves: No cranial nerve deficit.     Motor: Weakness (using cane) present.     Deep Tendon Reflexes: Reflexes are normal and symmetric.  Psychiatric:         Behavior: Behavior normal.        Thought Content: Thought content normal.        Judgment: Judgment normal.      BP (!) 101/56   Pulse 65   Temp 98.1 F (36.7 C)   Ht '5\' 8"'$  (1.727 m)   Wt 140 lb (63.5 kg)   SpO2 96% Comment: patient has reynaud's  BMI 21.29 kg/m       Assessment & Plan:  [name] comes in today with chief complaint of Medical Management of Chronic Issues (Weight gain, fluid build up)   Diagnosis and orders addressed:  1. Alcoholic peripheral neuropathy (HCC) - CMP14+EGFR  2. Aortic atherosclerosis (HCC) - CMP14+EGFR  3. Centrilobular emphysema (HCC) - CMP14+EGFR  4. Chronic systolic heart failure (HCC) - CMP14+EGFR - Brain natriuretic peptide  5. Essential hypertension - CMP14+EGFR  6. GAD (generalized anxiety disorder) - CMP14+EGFR  7. Hyperlipidemia, unspecified hyperlipidemia type - CMP14+EGFR  8. Late effect of cerebrovascular accident (CVA)  - CMP14+EGFR  9. Hospital discharge follow-up - [place] pending Health Maintenance reviewed Diet and exercise encouraged  Follow up plan: 2 months    Evelina Dun, FNP

## 2022-07-18 NOTE — Telephone Encounter (Signed)
Prescription refill request for Eliquis received. Indication: Afib  Last office visit: 05/26/22 (Hochrein)  Scr: 1.45 (07/11/22)  Age: 82 Weight: 63.5kg  Appropriate dose. Refill sent.

## 2022-07-18 NOTE — Patient Instructions (Signed)
Heart Failure, Diagnosis  Heart failure is a condition in which the heart has trouble pumping blood. This may mean that the heart cannot pump enough blood out to the body or that the heart does not fill up with enough blood. For some people with heart failure, fluid may back up into the lungs. There may also be swelling (edema) in the lower legs. Heart failure is usually a long-term (chronic) condition. It is important for you to take good care of yourself and follow the treatment plan from your health care provider. Different stages of heart failure have different treatment plans. The stages are: Stage A: At risk for heart failure. Having no symptoms of heart failure, but being at risk for developing heart failure. Stage B: Pre-heart failure. Having no symptoms of heart failure, but having structural changes to the heart that indicate heart failure. Stage C: Symptomatic heart failure. Having symptoms of heart failure in addition to structural changes to the heart that indicate heart failure. Stage D: Advanced heart failure. Having symptoms that interfere with daily life and frequent hospitalizations related to heart failure. What are the causes? This condition may be caused by: High blood pressure (hypertension). Hypertension causes the heart muscle to work harder than normal. Coronary artery disease, or CAD. CAD is the buildup of cholesterol and fat (plaque) in the arteries of the heart. Heart attack, also called myocardial infarction. This injures the heart muscle, making it hard for the heart to pump blood. Abnormal heart valves. The valves do not open and close properly, forcing the heart to pump harder to keep the blood flowing. Heart muscle disease, inflammation, or infection (cardiomyopathy or myocarditis). This is damage to the heart muscle. It can increase the risk of heart failure. Lung disease. The heart works harder when the lungs are not healthy. What increases the risk? The risk  of heart failure increases as a person ages. This condition is also more likely to develop in people who: Are obese. Use tobacco or nicotine products. Abuse alcohol or drugs. Have taken medicines that can damage the heart, such as chemotherapy drugs. Have any of these conditions: Diabetes. Abnormal heart rhythms. Thyroid problems. Low blood counts (anemia). Chronic kidney disease. Have a family history of heart failure. What are the signs or symptoms? Symptoms of this condition include: Shortness of breath with activity, such as when climbing stairs. A cough that does not go away. Swelling of the feet, ankles, legs, or abdomen. Losing or gaining weight for no reason. Trouble breathing when lying flat. Waking from sleep because of the need to sit up and get more air. Rapid heartbeat. Other symptoms may include: Tiredness (fatigue) and loss of energy. Feeling light-headed, dizzy, or close to fainting. Nausea or loss of appetite. Waking up more often during the night to urinate (nocturia). Confusion. How is this diagnosed? This condition is diagnosed based on: Your medical history, symptoms, and a physical exam. Blood tests. Diagnostic tests, which may include: Echocardiogram. Electrocardiogram (ECG). Chest X-ray. Exercise stress test. Cardiac MRI. Cardiac catheterization and angiogram. Radionuclide scans. How is this treated? Treatment for this condition is aimed at managing the symptoms of heart failure. Medicines Treatment may include medicines that: Help lower blood pressure by relaxing (dilating) the blood vessels. These medicines are called ACE inhibitors (angiotensin-converting enzyme), ARBs (angiotensin receptor blockers), or vasodilators. Cause the kidneys to remove salt and water from the blood through urination (diuretics). Improve heart muscle strength and prevent the heart from beating too fast (beta blockers). Increase the   force of the heartbeat  (digoxin). Lower heart rates. Certain diabetes medicines (SGLT-2 inhibitors) may also be used in treatment. Healthy behavior changes Treatment may also include making healthy lifestyle changes, such as: Reaching and staying at a healthy weight. Not using tobacco or nicotine products. Eating heart-healthy foods. Limiting or avoiding alcohol. Stopping the use of illegal drugs. Being physically active. Participating in a cardiac rehabilitation program, which is a treatment program to improve your health and well-being through exercise training, education, and counseling. Other treatments Other treatments may include: Procedures to open blocked arteries or repair damaged valves. Placing a pacemaker to improve heart function (cardiac resynchronization therapy). Placing a device to treat serious abnormal heart rhythms (implantable cardioverter defibrillator, or ICD). Placing a device to improve the pumping ability of the heart (left ventricular assist device, or LVAD). Receiving a healthy heart from a donor (heart transplant). This is done when other treatments have not helped. Follow these instructions at home: Manage other health conditions as told by your health care provider. These may include hypertension, diabetes, thyroid disease, or abnormal heart rhythms. Get ongoing education and support as needed. Learn as much as you can about heart failure. Keep all follow-up visits. This is important. Where to find more information American Heart Association: www.heart.org Centers for Disease Control and Prevention: www.cdc.gov NIH National Institute on Aging: www.nia.nih.gov Summary Heart failure is a condition in which the heart has trouble pumping blood. This condition is commonly caused by high blood pressure and other diseases of the heart and lungs. Symptoms of this condition include shortness of breath, tiredness (fatigue), nausea, and swelling of the feet, ankles, legs, or  abdomen. Treatments for this condition may include medicines, lifestyle changes, and surgery. Manage other health conditions as told by your health care provider. This information is not intended to replace advice given to you by your health care provider. Make sure you discuss any questions you have with your health care provider. Document Revised: 09/14/2021 Document Reviewed: 12/28/2019 Elsevier Patient Education  2023 Elsevier Inc.  

## 2022-07-18 NOTE — Addendum Note (Signed)
Addended by: Geryl Rankins D on: 07/18/2022 01:47 PM   Modules accepted: Orders

## 2022-07-19 LAB — CMP14+EGFR
ALT: 55 IU/L — ABNORMAL HIGH (ref 0–44)
AST: 52 IU/L — ABNORMAL HIGH (ref 0–40)
Albumin/Globulin Ratio: 1.4 (ref 1.2–2.2)
Albumin: 4 g/dL (ref 3.8–4.8)
Alkaline Phosphatase: 370 IU/L — ABNORMAL HIGH (ref 44–121)
BUN/Creatinine Ratio: 18 (ref 10–24)
BUN: 32 mg/dL — ABNORMAL HIGH (ref 8–27)
Bilirubin Total: 0.4 mg/dL (ref 0.0–1.2)
CO2: 19 mmol/L — ABNORMAL LOW (ref 20–29)
Calcium: 9 mg/dL (ref 8.6–10.2)
Chloride: 99 mmol/L (ref 96–106)
Creatinine, Ser: 1.8 mg/dL — ABNORMAL HIGH (ref 0.76–1.27)
Globulin, Total: 2.8 g/dL (ref 1.5–4.5)
Glucose: 84 mg/dL (ref 70–99)
Potassium: 5 mmol/L (ref 3.5–5.2)
Sodium: 135 mmol/L (ref 134–144)
Total Protein: 6.8 g/dL (ref 6.0–8.5)
eGFR: 38 mL/min/{1.73_m2} — ABNORMAL LOW (ref >59)

## 2022-07-19 LAB — BRAIN NATRIURETIC PEPTIDE: BNP: 746.9 pg/mL — ABNORMAL HIGH (ref 0.0–100.0)

## 2022-07-21 ENCOUNTER — Encounter: Payer: Self-pay | Admitting: Cardiology

## 2022-07-21 NOTE — Telephone Encounter (Signed)
Error

## 2022-07-21 NOTE — Telephone Encounter (Signed)
Spoke with daughter to inform her pt is scheduled with Arnold Long tomorrow to discuss labs, eliquis, and edema. She stated pt will be at appointment.

## 2022-07-22 ENCOUNTER — Ambulatory Visit: Payer: Medicare Other | Attending: Adult Health | Admitting: Adult Health

## 2022-07-22 ENCOUNTER — Encounter: Payer: Self-pay | Admitting: Adult Health

## 2022-07-22 VITALS — BP 110/64 | HR 42 | Ht 67.0 in | Wt 143.4 lb

## 2022-07-22 DIAGNOSIS — R7401 Elevation of levels of liver transaminase levels: Secondary | ICD-10-CM

## 2022-07-22 DIAGNOSIS — Z79899 Other long term (current) drug therapy: Secondary | ICD-10-CM | POA: Diagnosis not present

## 2022-07-22 DIAGNOSIS — D538 Other specified nutritional anemias: Secondary | ICD-10-CM | POA: Diagnosis not present

## 2022-07-22 DIAGNOSIS — I5022 Chronic systolic (congestive) heart failure: Secondary | ICD-10-CM | POA: Diagnosis not present

## 2022-07-22 MED ORDER — FUROSCIX 80 MG/10ML ~~LOC~~ CTKT: 80.0000 mg | CARTRIDGE | SUBCUTANEOUS | 0 refills | Status: DC

## 2022-07-22 NOTE — Progress Notes (Signed)
Cardiology Clinic Note   Patient Name: [name] Date of Encounter: 07/22/2022  Primary Care Provider:  Sharion Balloon, FNP Primary Cardiologist:  Minus Breeding, MD  Patient Profile    82 year old male with history of nonischemic cardiomyopathy, BiV ICD pacemaker in situ (Incepta CRT-D with LV1 header ), ventricular tachycardia, history of right M2 branch CVA hypertension, permanent atrial fibrillation, on Eliquis CHA2DS2-VASc score of 5, ongoing tobacco abuse, hyperlipidemia, OSA on BiPAP, and iron deficiency anemia.  Last seen by Dr. Percival Spanish on 05/26/2022.  At that time he was stable, there was no evidence of volume overload after adjustment of diuretic during recent hospitalization at [facility] where he had been diagnosed with a CVA.  Past Medical History    Past Medical History:  Diagnosis Date   Anxiety    Arthritis    CAD (coronary artery disease)    Nonobstructive cath 2012.  PCI to RCA 2003   Carotid artery disease (HCC)    Cataract    CHF (congestive heart failure) (HCC)    COPD (chronic obstructive pulmonary disease) (HCC)    Depression    Dyslipidemia    EtOH dependence (HCC)    GERD (gastroesophageal reflux disease)    Hyperlipidemia    Hypertension    Insomnia    Nonischemic cardiomyopathy (HCC)    EF was 20% (60% last echo 2015)   Pacemaker    and defibrillator   PAF (paroxysmal atrial fibrillation) ([place])    Peptic ulcer disease    Tobacco abuse    Past Surgical History:  Procedure Laterality Date   BIV ICD GENERTAOR CHANGE OUT N/A 03/06/2014   [employer] Gen change by Dr Rayann Heman Beckie Salts CRT-D with LV1 header)   biventricular defibrillator implantation     [employer] and pacemaker   CATARACT EXTRACTION W/PHACO Right 12/10/2015   Procedure: CATARACT EXTRACTION PHACO AND INTRAOCULAR LENS PLACEMENT RIGHT EYE; CDE: 19.30;  Surgeon: Tonny Branch, MD;  Location: [facility];  Service: Ophthalmology;  Laterality: Right;   CATARACT  EXTRACTION W/PHACO Left 01/14/2016   Procedure: CATARACT EXTRACTION PHACO AND INTRAOCULAR LENS PLACEMENT LEFT EYE; CDE:  8.03;  Surgeon: Tonny Branch, MD;  Location: [facility];  Service: Ophthalmology;  Laterality: Left;   COLONOSCOPY     EYE SURGERY     Ulcer surgery     repair of stomach ulcer    Allergies  Allergies  Allergen Reactions   Gabapentin Anaphylaxis   Sudafed [Pseudoephedrine Hcl] Other (See Comments)    Unknown   Prednisone Other (See Comments)    Depression and "personality changes".   Avelox [Moxifloxacin Hcl In Nacl] Other (See Comments)    Unknown     History of Present Illness    [name] presents today post ED follow-up after being seen at [facility] ED on 07/13/2022 for abdominal pain and was diagnosed with diverticulitis.  He was treated with antibiotics and released.  The patient was not found to have any volume overload.  Chest x-ray revealed no acute abnormality.  CT scan of the abdomen did reveal diverticulitis with a renal lesion that needs further follow-up on an outpatient basis.  [name] family called our office on 07/14/2022 due to volume overload which occurred after he returned from hospitalization.  He was advised to take extra doses of Lasix for 3 days and follow-up on Monday with PCP.  On follow-up with his PCP it was noted that he had elevation in his BNP to 749.  He continued to  take Lasix 40 mg daily along with potassium supplement 20 mill equivalents daily.  He has been noticing worsening lower extremity edema, abdominal distention and dyspnea.  He was also noted to have elevated alkaline phosphatase of 370, up from 204 2 months prior, AST and ALT were elevated as well (52 and 55 respectively).  He comes to the office short of breath, frail, with an O2 sat of 88% on room air.  O2 was placed on the patient and he did increase slightly.  But then decreased into the high 80s..  His daughter who is with him states that he is not very energetic.   He is normally up and around moving really well, has more energy, but has not been able to do so over the last few days with fluid retention.  On review of his weights since 06/16/2022, baseline weight was 123 pounds, increasing to 140 pounds on 07/18/2022, and then 143 pounds on 07/22/2022.  His daughter states that his weight is normally less then 130 pounds.  Home Medications    Current Outpatient Medications  Medication Sig Dispense Refill   albuterol (VENTOLIN HFA) 108 (90 Base) MCG/ACT inhaler INHALE 1 PUFF BY MOUTH EVERY 6 HOURS AS NEEDED FOR SHORTNESS OF BREATH 9 g 0   ALPRAZolam (XANAX) 1 MG tablet Take 1 tablet (1 mg total) by mouth 2 (two) times daily as needed. Keep appt w/ Alyse Low for refills 60 tablet 0   amLODipine (NORVASC) 10 MG tablet Take 1 tablet by mouth once daily 90 tablet 1   apixaban (ELIQUIS) 5 MG TABS tablet Take 1 tablet by mouth twice daily 60 tablet 5   cetirizine (ZYRTEC) 10 MG tablet Take 10 mg by mouth daily as needed for allergies.     furosemide (LASIX) 40 MG tablet Take 1 tablet by mouth daily.     KLOR-CON M20 20 MEQ tablet Take 1 tablet (20 mEq total) by mouth daily. 90 tablet 1   lisinopril (ZESTRIL) 40 MG tablet Take 1 tablet by mouth once daily 90 tablet 0   metoprolol tartrate (LOPRESSOR) 100 MG tablet Take 1 tablet by mouth twice daily 180 tablet 0   Multiple Vitamin (MULTIVITAMIN) capsule Take 1 capsule by mouth daily.     pantoprazole (PROTONIX) 40 MG tablet Take 1 tablet by mouth once daily 90 tablet 0   pravastatin (PRAVACHOL) 80 MG tablet Take 1 tablet (80 mg total) by mouth every evening. 90 tablet 3   pregabalin (LYRICA) 50 MG capsule Take 1 capsule (50 mg total) by mouth 2 (two) times daily. 60 capsule 2   tamsulosin (FLOMAX) 0.4 MG CAPS capsule Take 1 capsule (0.4 mg total) by mouth daily. 90 capsule 3   Fluticasone-Umeclidin-Vilant (TRELEGY ELLIPTA) 100-62.5-25 MCG/ACT AEPB Inhale 1 puff into the lungs daily. (Patient not taking: Reported on  07/22/2022) 1 each 11   furosemide (LASIX) 20 MG tablet 40 mg daily for next 5 days, then decrease to 20 mg daily (Patient not taking: Reported on 07/22/2022) 95 tablet 1   No current facility-administered medications for this visit.     Family History    Family History  Problem Relation Age of Onset   Heart failure Father    Colon polyps Sister    GI Bleed Sister    GI Bleed Brother    Heart attack Brother    Psoriasis Daughter    Arthritis/Rheumatoid Daughter    Cancer Brother    Heart disease Brother    Heart disease Brother  Colon cancer Neg Hx    Esophageal cancer Neg Hx    Rectal cancer Neg Hx    Stomach cancer Neg Hx    He indicated that his mother is deceased. He indicated that his father is deceased. He indicated that both of his sisters are deceased. He indicated that three of his eight brothers are alive. He indicated that his maternal grandmother is deceased. He indicated that his maternal grandfather is deceased. He indicated that his paternal grandmother is deceased. He indicated that his paternal grandfather is deceased. He indicated that both of his daughters are alive. He indicated that both of his sons are alive. He indicated that the status of his neg hx is unknown.  Social History    Social History   Socioeconomic History   Marital status: Divorced    Spouse name: Not on file   Number of children: 4   Years of education: Some college   Highest education level: Some college, no degree  Occupational History   Occupation: Retired    Fish farm manager: [employer]  Tobacco Use   Smoking status: Every Day    Packs/day: 0.25    Years: 45.00    Total pack years: 11.25    Types: Cigarettes    Last attempt to quit: 05/13/2022    Years since quitting: 0.1   Smokeless tobacco: Former    Types: Chew    Quit date: 06/20/1989   Tobacco comments:    he is not interested in cessation    Patient started back smoking about 3 Cigarettes a daily  Vaping Use   Vaping  Use: Never used  Substance and Sexual Activity   Alcohol use: Yes    Alcohol/week: 6.0 standard drinks of alcohol    Types: 6 Cans of beer per week    Comment: drings 4-6 beers per day   Drug use: No    Types: Marijuana    Comment: last used marijuana yesterday   Sexual activity: Not Currently    Birth control/protection: None  Other Topics Concern   Not on file  Social History Narrative   Divorced 2 sons 2 daughters   Is retired from [employer] logistics/support/administrative officer, he was a Building control surveyor   3 alcoholic beverages daily no caffeine no drugs he still smokes no other tobacco or drug use   Social Determinants of Radio broadcast assistant Strain: Low Risk  (04/21/2021)   Overall Financial Resource Strain (CARDIA)    Difficulty of Paying Living Expenses: Not hard at all  Food Insecurity: No Food Insecurity (04/21/2021)   Hunger Vital Sign    Worried About Running Out of Food in the Last Year: Never true    [place] in the Last Year: Never true  Transportation Needs: No Transportation Needs (04/21/2021)   PRAPARE - Hydrologist (Medical): No    Lack of Transportation (Non-Medical): No  Physical Activity: Inactive (04/21/2021)   Exercise Vital Sign    Days of Exercise per Week: 0 days    Minutes of Exercise per Session: 0 min  Stress: No Stress Concern Present (04/21/2021)   [place]    Feeling of Stress : Only a little  Social Connections: Socially Isolated (04/21/2021)   Social Connection and Isolation Panel [NHANES]    Frequency of Communication with Friends and Family: More than three times a week    Frequency of Social Gatherings with Friends and Family: More  than three times a week    Attends Religious Services: Never    Active Member of Clubs or Organizations: No    Attends Archivist Meetings: Never    Marital Status: Divorced  Human resources officer Violence: Not At Risk  (04/21/2021)   Humiliation, Afraid, Rape, and Kick questionnaire    Fear of Current or Ex-Partner: No    Emotionally Abused: No    Physically Abused: No    Sexually Abused: No     Review of Systems    General:  No chills, fever, night sweats positive for increased weight changes.  Cardiovascular:  No chest pain, positive for dyspnea on exertion, positive for edema in the lower extremities, orthopnea, palpitations, paroxysmal nocturnal dyspnea. Dermatological: No rash, lesions/masses Respiratory: No cough, dyspnea Urologic: No hematuria, dysuria Abdominal:   No nausea, vomiting, diarrhea, bright red blood per rectum, melena, or hematemesis Neurologic:  No visual changes, wkns, changes in mental status. All other systems reviewed and are otherwise negative except as noted above.     Physical Exam    VS:  BP 110/64 (BP Location: Left Arm, Patient Position: Sitting, Cuff Size: Normal)   Pulse (!) 59   Ht '5\' 7"'$  (1.702 m)   Wt 143 lb 6.4 oz (65 kg)   SpO2 (!) 89%   BMI 22.46 kg/m  , BMI Body mass index is 22.46 kg/m.     GEN: Well nourished, well developed, in no acute distress. HEENT: normal. Neck: Supple, no JVD, carotid bruits, or masses. Cardiac: RRR, 2/6 systolic murmurs, rubs, or gallops. No clubbing, cyanosis, 2+ pretibial edema, 3+ in the ankles edema.  Radials/DP/PT 2+ and equal bilaterally.  Respiratory:  Respirations regular and unlabored, clear to auscultation bilaterally.  Wearing oxygen via nasal cannula placed on arrival to the office. GI: Soft, nontender, nondistended, BS + x 4. MS: no deformity or atrophy. Skin: warm and dry, no rash. Neuro:  Strength and sensation are intact. Psych: Normal affect.  Accessory Clinical Findings    ECG personally reviewed by me today-not completed today.  Lab Results  Component Value Date   WBC 6.1 06/16/2022   HGB 9.6 (L) 06/16/2022   HCT 29.4 (L) 06/16/2022   MCV 94 06/16/2022   PLT 247 06/16/2022   Lab Results   Component Value Date   CREATININE 1.80 (H) 07/18/2022   BUN 32 (H) 07/18/2022   NA 135 07/18/2022   K 5.0 07/18/2022   CL 99 07/18/2022   CO2 19 (L) 07/18/2022   Lab Results  Component Value Date   ALT 55 (H) 07/18/2022   AST 52 (H) 07/18/2022   ALKPHOS 370 (H) 07/18/2022   BILITOT 0.4 07/18/2022   Lab Results  Component Value Date   CHOL 154 05/26/2022   HDL 55 05/26/2022   LDLCALC 81 05/26/2022   TRIG 98 05/26/2022   CHOLHDL 2.8 05/26/2022    Lab Results  Component Value Date   HGBA1C 5.7 09/07/2010    Review of Prior Studies:   Assessment & Plan   1.  Acute on chronic systolic CHF: I will place a Furosix infusion on his abdomen to assist with diuresis.  He states that he has not had a lot of diuresis from 40 mg of Lasix daily, he has not found his diuretic to be working as well and he continues to gain weight and edema.  He is short of breath and lacks energy.  He will follow-up in the office on Monday February 5th  to  be checked concerning his diuresis and have a BMET drawn.  The patient and his family have been advised that if he does not diurese, continues to feel short of breath, or more fatigued he is to present to the ED for further evaluation.  2.  Anemia: Review of lab work demonstrates that the patient's hemoglobin was 9.6 on 06/16/2022, down from 11.5 in February 2023, and 10.4 in August 2023.  On Monday he will have a CBC drawn to evaluate status.  This may be delusional due to decompensated heart failure versus worsening anemia causing his shortness of breath.  3.  Transaminitis: He has elevated AST at 52 compared to 27 1 month earlier, and elevated ALT of 55 compared to 48-monthearlier.  Alkaline phosphatase is also increased to 370.  Will need to follow-up with PCP and possible have GI evaluation NAFLD,  It is noted that he is on pravastatin which does not influence liver function.  Uncertain of recent diverticulitis infection is influencing labs.  Recommend  patient be seen by PCP or GI.    Signed, KPhill Myron LWest Pugh ANP, [place]  07/22/2022 3:15 PM      Office ([phone] ([phone] Notice: This dictation was prepared with Dragon dictation along with smaller phrase technology. Any transcriptional errors that result from this process are unintentional and may not be corrected upon review.

## 2022-07-22 NOTE — Patient Instructions (Addendum)
Medication Instructions:  Potassium ( Take additional Tablet Today and Saturday February 2 only). *If you need a refill on your cardiac medications before your next appointment, please call your pharmacy*   Lab Work: BMET Monday If you have labs (blood work) drawn today and your tests are completely normal, you will receive your results only by: [place] (if you have MyChart) OR A paper copy in the mail If you have any lab test that is abnormal or we need to change your treatment, we will call you to review the results.   Testing/Procedures: No Testing   Follow-Up: At [facility], you and your health needs are our priority.  As part of our continuing mission to provide you with exceptional heart care, we have created designated Provider Care Teams.  These Care Teams include your primary Cardiologist (physician) and Advanced Practice Providers (APPs -  Physician Assistants and Nurse Practitioners) who all work together to provide you with the care you need, when you need it.  We recommend signing up for the patient portal called "MyChart".  Sign up information is provided on this After Visit Summary.  MyChart is used to connect with patients for Virtual Visits (Telemedicine).  Patients are able to view lab/test results, encounter notes, upcoming appointments, etc.  Non-urgent messages can be sent to your provider as well.   To learn more about what you can do with MyChart, go to [email or web address].    Your next appointment:   Keep Scheduled Appointment  Provider:   Minus Breeding, MD

## 2022-07-24 DIAGNOSIS — I130 Hypertensive heart and chronic kidney disease with heart failure and stage 1 through stage 4 chronic kidney disease, or unspecified chronic kidney disease: Secondary | ICD-10-CM | POA: Diagnosis not present

## 2022-07-24 DIAGNOSIS — Z8673 Personal history of transient ischemic attack (TIA), and cerebral infarction without residual deficits: Secondary | ICD-10-CM | POA: Insufficient documentation

## 2022-07-24 DIAGNOSIS — Z79899 Other long term (current) drug therapy: Secondary | ICD-10-CM | POA: Diagnosis not present

## 2022-07-24 DIAGNOSIS — N400 Enlarged prostate without lower urinary tract symptoms: Secondary | ICD-10-CM | POA: Insufficient documentation

## 2022-07-24 DIAGNOSIS — D649 Anemia, unspecified: Secondary | ICD-10-CM | POA: Diagnosis not present

## 2022-07-24 DIAGNOSIS — I5023 Acute on chronic systolic (congestive) heart failure: Secondary | ICD-10-CM | POA: Diagnosis not present

## 2022-07-24 DIAGNOSIS — J441 Chronic obstructive pulmonary disease with (acute) exacerbation: Secondary | ICD-10-CM | POA: Diagnosis not present

## 2022-07-24 DIAGNOSIS — I2510 Atherosclerotic heart disease of native coronary artery without angina pectoris: Secondary | ICD-10-CM | POA: Diagnosis not present

## 2022-07-24 DIAGNOSIS — Z7901 Long term (current) use of anticoagulants: Secondary | ICD-10-CM | POA: Diagnosis not present

## 2022-07-24 DIAGNOSIS — N179 Acute kidney failure, unspecified: Secondary | ICD-10-CM | POA: Diagnosis not present

## 2022-07-24 DIAGNOSIS — E785 Hyperlipidemia, unspecified: Secondary | ICD-10-CM | POA: Diagnosis not present

## 2022-07-24 DIAGNOSIS — Z888 Allergy status to other drugs, medicaments and biological substances status: Secondary | ICD-10-CM | POA: Diagnosis not present

## 2022-07-24 DIAGNOSIS — I428 Other cardiomyopathies: Secondary | ICD-10-CM | POA: Diagnosis not present

## 2022-07-24 DIAGNOSIS — N1831 Chronic kidney disease, stage 3a: Secondary | ICD-10-CM | POA: Diagnosis not present

## 2022-07-24 DIAGNOSIS — F17210 Nicotine dependence, cigarettes, uncomplicated: Secondary | ICD-10-CM | POA: Diagnosis not present

## 2022-07-24 DIAGNOSIS — I517 Cardiomegaly: Secondary | ICD-10-CM | POA: Diagnosis not present

## 2022-07-24 DIAGNOSIS — G4733 Obstructive sleep apnea (adult) (pediatric): Secondary | ICD-10-CM | POA: Diagnosis not present

## 2022-07-24 DIAGNOSIS — J9811 Atelectasis: Secondary | ICD-10-CM | POA: Diagnosis not present

## 2022-07-24 DIAGNOSIS — K219 Gastro-esophageal reflux disease without esophagitis: Secondary | ICD-10-CM | POA: Diagnosis not present

## 2022-07-24 DIAGNOSIS — I480 Paroxysmal atrial fibrillation: Secondary | ICD-10-CM | POA: Diagnosis not present

## 2022-07-24 DIAGNOSIS — J439 Emphysema, unspecified: Secondary | ICD-10-CM | POA: Diagnosis not present

## 2022-07-24 DIAGNOSIS — Z95810 Presence of automatic (implantable) cardiac defibrillator: Secondary | ICD-10-CM | POA: Diagnosis not present

## 2022-07-24 DIAGNOSIS — I088 Other rheumatic multiple valve diseases: Secondary | ICD-10-CM | POA: Diagnosis not present

## 2022-07-24 DIAGNOSIS — J90 Pleural effusion, not elsewhere classified: Secondary | ICD-10-CM | POA: Diagnosis not present

## 2022-07-24 DIAGNOSIS — D631 Anemia in chronic kidney disease: Secondary | ICD-10-CM | POA: Diagnosis not present

## 2022-07-24 DIAGNOSIS — E871 Hypo-osmolality and hyponatremia: Secondary | ICD-10-CM | POA: Diagnosis not present

## 2022-07-24 DIAGNOSIS — R9431 Abnormal electrocardiogram [ECG] [EKG]: Secondary | ICD-10-CM | POA: Diagnosis not present

## 2022-07-24 DIAGNOSIS — L409 Psoriasis, unspecified: Secondary | ICD-10-CM | POA: Diagnosis not present

## 2022-07-26 ENCOUNTER — Ambulatory Visit (INDEPENDENT_AMBULATORY_CARE_PROVIDER_SITE_OTHER): Payer: Medicare Other

## 2022-07-26 DIAGNOSIS — I428 Other cardiomyopathies: Secondary | ICD-10-CM | POA: Diagnosis not present

## 2022-07-27 ENCOUNTER — Encounter: Payer: Self-pay | Admitting: Cardiology

## 2022-07-27 ENCOUNTER — Other Ambulatory Visit: Payer: Self-pay | Admitting: Family

## 2022-07-27 DIAGNOSIS — F411 Generalized anxiety disorder: Secondary | ICD-10-CM

## 2022-07-27 NOTE — Telephone Encounter (Signed)
Called and spoke with patients daughter and patient. Offered next available apt with Dr. Percival Spanish which is tomorrow. Patient was discharged today from the hospital and would like to see how he does over the weekend and follow up next week. Scheduled patient to see Dr. Percival Spanish on Monday 2/12 at 12pm. Patient aware of appointment time and date. Advised to call back with any concerns.

## 2022-07-27 NOTE — Telephone Encounter (Signed)
Pt saw you 07/18/22-ok to refill or does pt ntbs again?

## 2022-07-28 ENCOUNTER — Telehealth: Payer: Self-pay

## 2022-07-28 ENCOUNTER — Encounter (HOSPITAL_COMMUNITY): Payer: Self-pay | Admitting: *Deleted

## 2022-07-28 DIAGNOSIS — R0600 Dyspnea, unspecified: Secondary | ICD-10-CM | POA: Insufficient documentation

## 2022-07-28 NOTE — Patient Outreach (Signed)
  Care Coordination TOC Note Transition Care Management Follow-up Telephone Call Date of discharge and from where: [facility] 07/27/22 How have you been since you were released from the hospital? Per patients daughter he is doing very well. Any questions or concerns? No  Items Reviewed: Did the pt receive and understand the discharge instructions provided? Yes  Medications obtained and verified? Yes  Other? No  Any new allergies since your discharge? No  Dietary orders reviewed? Yes Do you have support at home? Yes   Home Care and Equipment/Supplies: Were home health services ordered? no If so, what is the name of the agency? N/a  Has the agency set up a time to come to the patient's home? no Were any new equipment or medical supplies ordered?  No What is the name of the medical supply agency? N/a Were you able to get the supplies/equipment? not applicable Do you have any questions related to the use of the equipment or supplies? No  Functional Questionnaire: (I = Independent and D = Dependent) ADLs: I  Bathing/Dressing- I  Meal Prep- I  Eating- I  Maintaining continence- I  Transferring/Ambulation- I  Managing Meds- I  Follow up appointments reviewed:  PCP Hospital f/u appt confirmed? No  Scheduled to see Dr. Percival Spanish on 06/01/23 @ 12:00. [facility] f/u appt confirmed? Yes  Scheduled to see Ronnald Collum on 08/02/22 @ 11:00. Are transportation arrangements needed? No  If their condition worsens, is the pt aware to call PCP or go to the Emergency Dept.? Yes Was the patient provided with contact information for the PCP's office or ED? Yes Was to pt encouraged to call back with questions or concerns? Yes  SDOH assessments and interventions completed:   Yes SDOH Interventions Today    Flowsheet Row Most Recent Value  SDOH Interventions   Housing Interventions Intervention Not Indicated  Utilities Interventions Intervention Not  Indicated       Care Coordination Interventions:  No Care Coordination interventions needed at this time.   Encounter Outcome:  Pt. Visit Completed

## 2022-07-29 LAB — CUP PACEART REMOTE DEVICE CHECK
Battery Remaining Longevity: 18 mo
Battery Remaining Percentage: 26 %
Brady Statistic RA Percent Paced: 2 %
Brady Statistic RV Percent Paced: 97 %
Date Time Interrogation Session: 20240208181400
HighPow Impedance: 47 Ohm
Implantable Lead Connection Status: 753985
Implantable Lead Connection Status: 753985
Implantable Lead Connection Status: 753985
Implantable Lead Implant Date: 20040312
Implantable Lead Implant Date: 20040312
Implantable Lead Implant Date: 20040312
Implantable Lead Location: 753858
Implantable Lead Location: 753859
Implantable Lead Location: 753860
Implantable Lead Model: 158
Implantable Lead Model: 4087
Implantable Lead Model: 4513
Implantable Lead Serial Number: 129158
Implantable Lead Serial Number: 209446
Implantable Lead Serial Number: 407264
Implantable Pulse Generator Implant Date: 20150917
Lead Channel Impedance Value: 1064 Ohm
Lead Channel Impedance Value: 518 Ohm
Lead Channel Impedance Value: 633 Ohm
Lead Channel Pacing Threshold Amplitude: 0.7 V
Lead Channel Pacing Threshold Amplitude: 1.2 V
Lead Channel Pacing Threshold Amplitude: 2.3 V
Lead Channel Pacing Threshold Pulse Width: 0.4 ms
Lead Channel Pacing Threshold Pulse Width: 0.4 ms
Lead Channel Pacing Threshold Pulse Width: 0.8 ms
Lead Channel Setting Pacing Amplitude: 2 V
Lead Channel Setting Pacing Amplitude: 2.5 V
Lead Channel Setting Pacing Amplitude: 3 V
Lead Channel Setting Pacing Pulse Width: 0.4 ms
Lead Channel Setting Pacing Pulse Width: 0.8 ms
Lead Channel Setting Sensing Sensitivity: 0.6 mV
Lead Channel Setting Sensing Sensitivity: 1 mV
Pulse Gen Serial Number: 106298

## 2022-07-31 DIAGNOSIS — Z7901 Long term (current) use of anticoagulants: Secondary | ICD-10-CM | POA: Diagnosis not present

## 2022-07-31 DIAGNOSIS — H748X1 Other specified disorders of right middle ear and mastoid: Secondary | ICD-10-CM | POA: Diagnosis not present

## 2022-07-31 DIAGNOSIS — S51812A Laceration without foreign body of left forearm, initial encounter: Secondary | ICD-10-CM | POA: Diagnosis not present

## 2022-07-31 DIAGNOSIS — G319 Degenerative disease of nervous system, unspecified: Secondary | ICD-10-CM | POA: Diagnosis not present

## 2022-07-31 DIAGNOSIS — I249 Acute ischemic heart disease, unspecified: Secondary | ICD-10-CM | POA: Diagnosis not present

## 2022-07-31 DIAGNOSIS — W01198A Fall on same level from slipping, tripping and stumbling with subsequent striking against other object, initial encounter: Secondary | ICD-10-CM | POA: Diagnosis not present

## 2022-07-31 DIAGNOSIS — I5043 Acute on chronic combined systolic (congestive) and diastolic (congestive) heart failure: Secondary | ICD-10-CM | POA: Insufficient documentation

## 2022-07-31 DIAGNOSIS — Z8673 Personal history of transient ischemic attack (TIA), and cerebral infarction without residual deficits: Secondary | ICD-10-CM | POA: Diagnosis not present

## 2022-07-31 DIAGNOSIS — F17210 Nicotine dependence, cigarettes, uncomplicated: Secondary | ICD-10-CM | POA: Diagnosis not present

## 2022-07-31 DIAGNOSIS — I6782 Cerebral ischemia: Secondary | ICD-10-CM | POA: Diagnosis not present

## 2022-07-31 DIAGNOSIS — S0990XA Unspecified injury of head, initial encounter: Secondary | ICD-10-CM | POA: Diagnosis not present

## 2022-07-31 NOTE — Progress Notes (Unsigned)
Cardiology Office Note   Date:  08/01/2022   ID:  [name], [name] 1940-09-16, MRN [id number]  PCP:  Sharion Balloon, FNP  Cardiologist:   Minus Breeding, MD   Chief Complaint  Patient presents with   Shortness of Breath      History of Present Illness: [name] is a 82 y.o. male who presents for follow up of non ischemic cardiomyopathy and atrial fib.   He was added to my schedule today because of the edema and falls.   He had a stroke Dec 2023.  He had resolution of hemiparesis.  He was hospitalized at [facility] with volume overload.  He had an EF of 50 - 55% on echo.    There was moderate TR.  He was managed for acute on chronic diastolic HF.   He had AKI.   I reviewed these records for this visit.    This was at [facility].  His daughter said that he was doing pretty well when he came out of the hospital after the stroke.  He probably had too much salt.  He gained volume.  She said she took him back to the hospital because he had some labored breathing and increase in his abdominal girth.  When he got home he started getting weaker and having increased volume despite having been diuresed in the hospital.  They came to our clinic last week and was sent home with subcutaneous Lasix but this did not seem to work.  There was some malfunction in the delivery and he was noted to have bleeding all over his shirt and the medicine did not dispense.  He is back today because of this and because he had falls with weakness.  Still increased shortness of breath.  Sats seem to be in the 90s.  He wears BiPAP at night.  He sleeps by himself at night.  And that is when he has been having falls.  They are planning to move to another home and he is going to live with his daughter who is quite attentive.    Of note in the hospital [facility] his EF was 50 to 55%.     Past Medical History:  Diagnosis Date   Anxiety    Arthritis    CAD (coronary artery disease)    Nonobstructive cath 2012.   PCI to RCA 2003   Carotid artery disease (HCC)    Cataract    CHF (congestive heart failure) (HCC)    COPD (chronic obstructive pulmonary disease) (HCC)    Depression    Dyslipidemia    EtOH dependence (HCC)    GERD (gastroesophageal reflux disease)    Hyperlipidemia    Hypertension    Insomnia    Nonischemic cardiomyopathy (HCC)    EF was 20% (60% last echo 2015)   Pacemaker    and defibrillator   PAF (paroxysmal atrial fibrillation) ([place])    Peptic ulcer disease    Tobacco abuse     Past Surgical History:  Procedure Laterality Date   BIV ICD GENERTAOR CHANGE OUT N/A 03/06/2014   [employer] Gen change by Dr Rayann Heman Beckie Salts CRT-D with LV1 header)   biventricular defibrillator implantation     [employer] and pacemaker   CATARACT EXTRACTION W/PHACO Right 12/10/2015   Procedure: CATARACT EXTRACTION PHACO AND INTRAOCULAR LENS PLACEMENT RIGHT EYE; CDE: 19.30;  Surgeon: Tonny Branch, MD;  Location: [facility];  Service: Ophthalmology;  Laterality: Right;   CATARACT  EXTRACTION W/PHACO Left 01/14/2016   Procedure: CATARACT EXTRACTION PHACO AND INTRAOCULAR LENS PLACEMENT LEFT EYE; CDE:  8.03;  Surgeon: Tonny Branch, MD;  Location: [facility];  Service: Ophthalmology;  Laterality: Left;   COLONOSCOPY     EYE SURGERY     Ulcer surgery     repair of stomach ulcer     Current Outpatient Medications  Medication Sig Dispense Refill   albuterol (VENTOLIN HFA) 108 (90 Base) MCG/ACT inhaler INHALE 1 PUFF BY MOUTH EVERY 6 HOURS AS NEEDED FOR SHORTNESS OF BREATH 9 g 0   ALPRAZolam (XANAX) 1 MG tablet TAKE 1 TABLET BY MOUTH TWICE DAILY . APPOINTMENT REQUIRED FOR FUTURE REFILLS 60 tablet 2   apixaban (ELIQUIS) 5 MG TABS tablet Take 1 tablet by mouth twice daily 60 tablet 5   cetirizine (ZYRTEC) 10 MG tablet Take 10 mg by mouth daily as needed for allergies.     Fluticasone-Umeclidin-Vilant (TRELEGY ELLIPTA) 100-62.5-25 MCG/ACT AEPB Inhale 1 puff into the lungs daily. 1 each 11   KLOR-CON M20  20 MEQ tablet Take 1 tablet (20 mEq total) by mouth daily. 90 tablet 1   lisinopril (ZESTRIL) 40 MG tablet Take 1 tablet by mouth once daily 90 tablet 0   metoprolol tartrate (LOPRESSOR) 100 MG tablet Take 1 tablet by mouth twice daily 180 tablet 0   Multiple Vitamin (MULTIVITAMIN) capsule Take 1 capsule by mouth daily.     pantoprazole (PROTONIX) 40 MG tablet Take 1 tablet by mouth once daily 90 tablet 0   pravastatin (PRAVACHOL) 80 MG tablet Take 1 tablet (80 mg total) by mouth every evening. 90 tablet 3   pregabalin (LYRICA) 50 MG capsule Take 1 capsule (50 mg total) by mouth 2 (two) times daily. 60 capsule 2   tamsulosin (FLOMAX) 0.4 MG CAPS capsule Take 1 capsule (0.4 mg total) by mouth daily. 90 capsule 3   No current facility-administered medications for this visit.    Allergies:   Gabapentin, Sudafed [pseudoephedrine hcl], Prednisone, and Avelox [moxifloxacin hcl in nacl]    ROS:  Please see the history of present illness.   Otherwise, review of systems are positive for weakness.   All other systems are reviewed and negative.    PHYSICAL EXAM: VS:  BP (!) 94/57   Pulse 90   Ht 5' 8"$  (1.727 m)   Wt 132 lb 9.6 oz (60.1 kg)   SpO2 97%   BMI 20.16 kg/m  , BMI Body mass index is 20.16 kg/m. GENERAL:  Very frail appearing NECK:  No jugular venous distention, waveform within normal limits, carotid upstroke brisk and symmetric, no bruits, no thyromegaly LUNGS:  Clear to auscultation bilaterally CHEST:  Unremarkable HEART:  PMI not displaced or sustained,S1 and S2 within normal limits, no S3, no S4, no clicks, no rubs, no murmurs ABD:  Flat, positive bowel sounds normal in frequency in pitch, no bruits, no rebound, no guarding, no midline pulsatile mass, no hepatomegaly, no splenomegaly  EXT:  2 plus pulses throughout, mild leg edema, no cyanosis no clubbing  EKG:  EKG is not ordered today.    Recent Labs: 05/26/2022: Magnesium 2.0 06/16/2022: Hemoglobin 9.6; Platelets  247 07/18/2022: ALT 55; BNP 746.9; BUN 32; Creatinine, Ser 1.80; Potassium 5.0; Sodium 135    Lipid Panel    Component Value Date/Time   CHOL 154 05/26/2022 0933   TRIG 98 05/26/2022 0933   TRIG CANCELED 01/31/2014 1618   HDL 55 05/26/2022 0933   HDL CANCELED 01/31/2014 1618  CHOLHDL 2.8 05/26/2022 0933   LDLCALC 81 05/26/2022 0933      Wt Readings from Last 3 Encounters:  08/01/22 132 lb 9.6 oz (60.1 kg)  07/22/22 143 lb 6.4 oz (65 kg)  07/18/22 140 lb (63.5 kg)      Other studies Reviewed: Additional studies/ records that were reviewed today include: None Review of the above records demonstrates:  Please see elsewhere in the note.     ASSESSMENT AND PLAN:  PAF:    Mr. MAHLON VIENNEAU has a CHA2DS2 - VASc score of 5.  For now I am going to continue his anticoagulation.   ACUTE ON CHRONIC SYSTOLIC AND DIASTOLIC HF: His EF was relatively preserved.  He has acute diastolic dysfunction.  I am going to try to switch him to torsemide 40 mg daily.  He will get a basic metabolic profile tomorrow when he sees his primary provider and also I like to see it again in a week.  Will have to watch his renal function carefully because he did have renal insufficiency.  VENTRICULAR TACHYCARDIA:    I reviewed the ICD interrogation in Feb.    CARDIOMOPATHY:  His EF was low normal.  TOBACCO ABUSE:   He needs to quit tobacco.   DYSLIPIDEMIA:   LDL was 81.  No change in therapy.   HTN: His blood pressure is very low and I will stop his amlodipine.  ETOH:   His daughter says he is not drinking.  SLEEP APNEA: He is using BiPAP.    WEAKNESS: He has had difficulty with his gait.  Because of the fall he went to the ER at [facility] and had a head CT the other day and there were no acute findings.  He has chronic atrophy.  I was concerned about may be missing chronic subdural hematomas but I cannot get an MRI because of his ICD.  We might have to do serial CTs if he has continuing  weakness.   Current medicines are reviewed at length with the patient today.  The patient does not have concerns regarding medicines.  The following changes have been made: None  Labs/ tests ordered today include:  See above  Orders Placed This Encounter  Procedures   Basic metabolic panel   Basic metabolic panel     Disposition:   FU with me in 2 weeks.   Signed, Minus Breeding, MD  08/01/2022 1:04 PM    [facility]

## 2022-08-01 ENCOUNTER — Ambulatory Visit: Payer: Medicare Other | Attending: Cardiology | Admitting: Cardiology

## 2022-08-01 ENCOUNTER — Encounter: Payer: Self-pay | Admitting: Cardiology

## 2022-08-01 VITALS — BP 94/57 | HR 90 | Ht 68.0 in | Wt 132.6 lb

## 2022-08-01 DIAGNOSIS — E785 Hyperlipidemia, unspecified: Secondary | ICD-10-CM

## 2022-08-01 DIAGNOSIS — I5043 Acute on chronic combined systolic (congestive) and diastolic (congestive) heart failure: Secondary | ICD-10-CM | POA: Diagnosis not present

## 2022-08-01 DIAGNOSIS — I480 Paroxysmal atrial fibrillation: Secondary | ICD-10-CM | POA: Diagnosis not present

## 2022-08-01 DIAGNOSIS — I10 Essential (primary) hypertension: Secondary | ICD-10-CM

## 2022-08-01 DIAGNOSIS — Z79899 Other long term (current) drug therapy: Secondary | ICD-10-CM

## 2022-08-01 DIAGNOSIS — Z720 Tobacco use: Secondary | ICD-10-CM

## 2022-08-01 MED ORDER — TORSEMIDE 20 MG PO TABS: 40.0000 mg | ORAL_TABLET | Freq: Every day | ORAL | 3 refills | Status: DC

## 2022-08-01 NOTE — Addendum Note (Signed)
Addended by: Jacqulynn Cadet on: 08/01/2022 01:11 PM   Modules accepted: Orders

## 2022-08-01 NOTE — Patient Instructions (Addendum)
Medication Instructions:  STOP Amlodipine STOP Lasix (Furosemide)  START Torsemide 40 mg daily-Take 2 (20 mg) tablets by mouth daily   *If you need a refill on your cardiac medications before your next appointment, please call your pharmacy*  Lab Work: Your physician recommends that you return for lab work TOMORROW at PCP office:  BMP  Your physician recommends that you return for lab work in 1 week:  BMP  If you have labs (blood work) drawn today and your tests are completely normal, you will receive your results only by: [place] (if you have [place]) OR A paper copy in the mail If you have any lab test that is abnormal or we need to change your treatment, we will call you to review the results.  Testing/Procedures: NONE ordered at this time of appointment   Follow-Up: At [facility], you and your health needs are our priority.  As part of our continuing mission to provide you with exceptional heart care, we have created designated Provider Care Teams.  These Care Teams include your primary Cardiologist (physician) and Advanced Practice Providers (APPs -  Physician Assistants and Nurse Practitioners) who all work together to provide you with the care you need, when you need it.   Your next appointment:   2 week(s)  Provider:   Minus Breeding, MD     Other Instructions

## 2022-08-02 ENCOUNTER — Telehealth: Payer: Self-pay | Admitting: Family Medicine

## 2022-08-02 ENCOUNTER — Encounter: Payer: Self-pay | Admitting: Nurse Practitioner

## 2022-08-02 ENCOUNTER — Ambulatory Visit (INDEPENDENT_AMBULATORY_CARE_PROVIDER_SITE_OTHER): Payer: Medicare Other | Admitting: Nurse Practitioner

## 2022-08-02 VITALS — BP 102/65 | HR 91 | Temp 96.1°F | Resp 20 | Ht 68.0 in | Wt 134.0 lb

## 2022-08-02 DIAGNOSIS — S51812D Laceration without foreign body of left forearm, subsequent encounter: Secondary | ICD-10-CM

## 2022-08-02 DIAGNOSIS — I110 Hypertensive heart disease with heart failure: Secondary | ICD-10-CM | POA: Diagnosis not present

## 2022-08-02 DIAGNOSIS — S51812A Laceration without foreign body of left forearm, initial encounter: Secondary | ICD-10-CM | POA: Diagnosis not present

## 2022-08-02 DIAGNOSIS — I5043 Acute on chronic combined systolic (congestive) and diastolic (congestive) heart failure: Secondary | ICD-10-CM | POA: Diagnosis not present

## 2022-08-02 DIAGNOSIS — W19XXXA Unspecified fall, initial encounter: Secondary | ICD-10-CM | POA: Diagnosis not present

## 2022-08-02 DIAGNOSIS — I509 Heart failure, unspecified: Secondary | ICD-10-CM | POA: Diagnosis not present

## 2022-08-02 DIAGNOSIS — Z7689 Persons encountering health services in other specified circumstances: Secondary | ICD-10-CM

## 2022-08-02 DIAGNOSIS — D649 Anemia, unspecified: Secondary | ICD-10-CM

## 2022-08-02 LAB — CMP14+EGFR
ALT: 13 IU/L (ref 0–44)
AST: 27 IU/L (ref 0–40)
Albumin/Globulin Ratio: 1.2 (ref 1.2–2.2)
Albumin: 3.7 g/dL — ABNORMAL LOW (ref 3.8–4.8)
Alkaline Phosphatase: 190 IU/L — ABNORMAL HIGH (ref 44–121)
BUN/Creatinine Ratio: 16 (ref 10–24)
BUN: 36 mg/dL — ABNORMAL HIGH (ref 8–27)
Bilirubin Total: 0.4 mg/dL (ref 0.0–1.2)
CO2: 19 mmol/L — ABNORMAL LOW (ref 20–29)
Calcium: 8.8 mg/dL (ref 8.6–10.2)
Chloride: 101 mmol/L (ref 96–106)
Creatinine, Ser: 2.22 mg/dL — ABNORMAL HIGH (ref 0.76–1.27)
Globulin, Total: 3.2 g/dL (ref 1.5–4.5)
Glucose: 100 mg/dL — ABNORMAL HIGH (ref 70–99)
Potassium: 5 mmol/L (ref 3.5–5.2)
Sodium: 134 mmol/L (ref 134–144)
Total Protein: 6.9 g/dL (ref 6.0–8.5)
eGFR: 29 mL/min/{1.73_m2} — ABNORMAL LOW (ref >59)

## 2022-08-02 LAB — CBC WITH DIFFERENTIAL/PLATELET
Basophils Absolute: 0.1 10*3/uL (ref 0.0–0.2)
Basos: 1 %
EOS (ABSOLUTE): 0.2 10*3/uL (ref 0.0–0.4)
Eos: 3 %
Hematocrit: 23.5 % — ABNORMAL LOW (ref 37.5–51.0)
Hemoglobin: 7.7 g/dL — CL (ref 13.0–17.7)
Immature Grans (Abs): 0 10*3/uL (ref 0.0–0.1)
Immature Granulocytes: 0 %
Lymphocytes Absolute: 1.9 10*3/uL (ref 0.7–3.1)
Lymphs: 21 %
MCH: 30.4 pg (ref 26.6–33.0)
MCHC: 32.8 g/dL (ref 31.5–35.7)
MCV: 93 fL (ref 79–97)
Monocytes Absolute: 1.2 10*3/uL — ABNORMAL HIGH (ref 0.1–0.9)
Monocytes: 13 %
Neutrophils Absolute: 5.6 10*3/uL (ref 1.4–7.0)
Neutrophils: 62 %
Platelets: 359 10*3/uL (ref 150–450)
RBC: 2.53 x10E6/uL — CL (ref 4.14–5.80)
RDW: 15.4 % (ref 11.6–15.4)
WBC: 8.9 10*3/uL (ref 3.4–10.8)

## 2022-08-02 NOTE — Telephone Encounter (Signed)
Hb 7.7 and 2.53 RBCs on call for critical  on labs from today, do not see actual result in epic yet, but it looks slightly lower than 2/6 lab from [facility] of hb 8.5. watch for blood in stool and recheck cbc tomorrow 2/14 or 2/15. Nurse, Please put in order for the patient.  Caryl Pina, MD [facility] 08/02/2022, 9:23 PM

## 2022-08-02 NOTE — Progress Notes (Signed)
   Subjective:    Patient ID: [name], male    DOB: 1940/08/16, 82 y.o.   MRN: [id number]  Today's visit was for Transitional Care Management.  The patient was discharged from [place] on 07/27/22 with a primary diagnosis of CHF.   Contact with the patient and/or caregiver, by a clinical staff member, was made on 07/28/22 and was documented as a telephone encounter within the EMR.  Through chart review and discussion with the patient I have determined that management of their condition is of high complexity.   Patient brought in by Daughter. He was in hospital for 3 days with CHF. Was SOB and coughing. He is better but still has slight cough. He fell a couple of times when he came home and he lacerated/skin tear. his left forearm. That is healing fine. Saw cardiologist yesterday. They stopped his amlodipine and changed lasix to toresemide.      Review of Systems  Constitutional:  Negative for diaphoresis.  Eyes:  Negative for pain.  Respiratory:  Negative for shortness of breath.   Cardiovascular:  Negative for chest pain, palpitations and leg swelling.  Gastrointestinal:  Negative for abdominal pain.  Endocrine: Negative for polydipsia.  Skin:  Negative for rash.  Neurological:  Negative for dizziness, weakness and headaches.  Hematological:  Does not bruise/bleed easily.  All other systems reviewed and are negative.      Objective:   Physical Exam Vitals reviewed.  Constitutional:      Appearance: Normal appearance.  Cardiovascular:     Rate and Rhythm: Normal rate and regular rhythm.     Heart sounds: Normal heart sounds.  Skin:    General: Skin is warm.     Comments: Steri strips on left forearm wound- no erythema or drainage  Neurological:     General: No focal deficit present.     Mental Status: He is alert and oriented to person, place, and time.  Psychiatric:        Mood and Affect: Mood normal.        Behavior: Behavior normal.    BP 102/65   Pulse 91    Temp (!) 96.1 F (35.6 C) (Temporal)   Resp 20   Ht '5\' 8"'$  (1.727 m)   Wt 134 lb (60.8 kg)   SpO2 97%   BMI 20.37 kg/m         Assessment & Plan:   [name] in today with chief complaint of Hospitalization Follow-up   1. Encounter for support and coordination of transition of care Hospital records reviewed  2. Acute on chronic congestive heart failure, unspecified heart failure type (HCC) Limit fluid intake Report any sob - CBC with Differential/Platelet - CMP14+EGFR  3. Skin tear of left forearm without complication, subsequent encounter Keep wound clean and dry Watch for signs of infection    The above assessment and management plan was discussed with the patient. The patient verbalized understanding of and has agreed to the management plan. Patient is aware to call the clinic if symptoms persist or worsen. Patient is aware when to return to the clinic for a follow-up visit. Patient educated on when it is appropriate to go to the emergency department.   Mary-Margaret Hassell Done, FNP

## 2022-08-02 NOTE — Patient Instructions (Signed)
Please call to schedule your mammogram and/or bone density: [facility]  Address: [address], [place], [place] [address] Phone: [phone]  [facility] [address] [place],    [address] Phone: [phone]    Heart Failure and Exercise Heart failure is a condition in which the heart does not fill or pump enough blood and oxygen to support your body and its functions. Heart failure is a long-term (chronic) condition. Living with heart failure can be challenging. However, following your health care provider's instructions about a healthy lifestyle may help improve your symptoms. This includes choosing the right exercise plan. Doing daily physical activity is important after a diagnosis of heart failure. You may have some activity restrictions, so talk to your health care provider before doing any exercises. What are the benefits of exercise? Exercise may: Make your heart muscles stronger. Lower your blood pressure and cholesterol. Help you lose weight. Help your bones stay strong. Improve your blood circulation. Help your body use oxygen better. This relieves symptoms such as fatigue and shortness of breath. Help your mental health by lowering the risk of depression and other problems. Improve your quality of life. Decrease your chance of hospital admission for heart failure. What is an exercise plan? An exercise plan is a set of specific exercises and training activities. You will work with your health care provider to create the exercise plan that works for you. The plan may include: Different types of exercises and how to do them. Cardiac rehabilitation exercises. These are supervised programs that are designed to strengthen your heart. What are strengthening exercises? Strengthening exercises are a type of physical activity that involves using resistance to improve your muscle strength.  Strengthening exercises usually have repetitive motions. These types of exercises can include: Lifting weights. Using weight machines. Using resistance tubes and bands. Using kettlebells. Using your body weight, such as doing push-ups or squats. What are balance exercises? Balance exercises are another type of physical activity. They strengthen the muscles of the back, abdomen, and pelvis (core muscles) and improve your balance. They can also lower your risk of falling. These types of exercises can include: Standing on one leg. Walking backward, sideways, and in a straight line. Standing up after sitting, without using your hands. Shifting your weight from one leg to the other. Lifting one leg in front of you. Doing tai chi. This is a type of exercise that uses slow movements and deep breathing. How can I increase my flexibility? Having better flexibility can keep you from falling. It can also lengthen your muscles, improve your range of motion, and help your joints. You can increase your flexibility by: Doing tai chi. Doing yoga. Stretching. How much aerobic exercise should I get?  Aerobic exercise strengthens your breathing and circulation system and increases your body's use of oxygen. This type of exercise causes your heart to beat faster while you are doing it. Examples of aerobic exercise include biking, walking, running, and swimming. Talk to your health care provider to find out how much aerobic exercise is safe for you. To do this type of exercise: Start exercising slowly, limiting the amount of time at first. You may need to start with 5 minutes of aerobic exercise every day. Slowly add more minutes until you can safely do at least 30 minutes of exercise at least 5 days a week. Summary Daily physical activity is important after a diagnosis of heart failure.   Exercise can make your heart muscles stronger. It also offers other benefits that will improve your health. Exercise can  decrease your chance of hospital admission for heart failure. Talk to your health care provider before doing any exercises. This information is not intended to replace advice given to you by your health care provider. Make sure you discuss any questions you have with your health care provider. Document Revised: 09/14/2021 Document Reviewed: 01/20/2020 Elsevier Patient Education  2023 Elsevier Inc.  

## 2022-08-03 NOTE — Telephone Encounter (Signed)
Patient and granddaughter aware.  He will come back in for repeat CBC.

## 2022-08-03 NOTE — Addendum Note (Signed)
Addended by: Michaela Corner on: 08/03/2022 08:50 AM   Modules accepted: Orders

## 2022-08-04 ENCOUNTER — Other Ambulatory Visit: Payer: Medicare Other

## 2022-08-04 DIAGNOSIS — D649 Anemia, unspecified: Secondary | ICD-10-CM

## 2022-08-04 LAB — CBC WITH DIFFERENTIAL/PLATELET
Basophils Absolute: 0.1 10*3/uL (ref 0.0–0.2)
Basos: 1 %
EOS (ABSOLUTE): 0.4 10*3/uL (ref 0.0–0.4)
Eos: 5 %
Hematocrit: 24.1 % — ABNORMAL LOW (ref 37.5–51.0)
Hemoglobin: 8 g/dL — CL (ref 13.0–17.7)
Immature Grans (Abs): 0 10*3/uL (ref 0.0–0.1)
Immature Granulocytes: 0 %
Lymphocytes Absolute: 2.1 10*3/uL (ref 0.7–3.1)
Lymphs: 26 %
MCH: 30.9 pg (ref 26.6–33.0)
MCHC: 33.2 g/dL (ref 31.5–35.7)
MCV: 93 fL (ref 79–97)
Monocytes Absolute: 1.1 10*3/uL — ABNORMAL HIGH (ref 0.1–0.9)
Monocytes: 13 %
Neutrophils Absolute: 4.4 10*3/uL (ref 1.4–7.0)
Neutrophils: 55 %
Platelets: 364 10*3/uL (ref 150–450)
RBC: 2.59 x10E6/uL — CL (ref 4.14–5.80)
RDW: 15.6 % — ABNORMAL HIGH (ref 11.6–15.4)
WBC: 8.1 10*3/uL (ref 3.4–10.8)

## 2022-08-08 ENCOUNTER — Encounter: Payer: Self-pay | Admitting: Family

## 2022-08-08 ENCOUNTER — Ambulatory Visit (INDEPENDENT_AMBULATORY_CARE_PROVIDER_SITE_OTHER): Payer: Medicare Other | Admitting: Family

## 2022-08-08 VITALS — BP 106/61 | HR 70 | Temp 96.0°F | Ht 68.0 in | Wt 129.4 lb

## 2022-08-08 DIAGNOSIS — I110 Hypertensive heart disease with heart failure: Secondary | ICD-10-CM

## 2022-08-08 DIAGNOSIS — D649 Anemia, unspecified: Secondary | ICD-10-CM | POA: Diagnosis not present

## 2022-08-08 DIAGNOSIS — I10 Essential (primary) hypertension: Secondary | ICD-10-CM

## 2022-08-08 DIAGNOSIS — I5022 Chronic systolic (congestive) heart failure: Secondary | ICD-10-CM | POA: Diagnosis not present

## 2022-08-08 LAB — HEMOGLOBIN, FINGERSTICK: Hemoglobin: 8.4 g/dL — ABNORMAL LOW (ref 12.6–17.7)

## 2022-08-08 NOTE — Progress Notes (Signed)
   Subjective:    Patient ID: [name], male    DOB: 1941-06-02, 82 y.o.   MRN: [id number]  Chief Complaint  Patient presents with   Follow-up    LOW HEMOGLOBIN    Pt presents to the office today recheck hgb. He was seen on 08/04/22 for a hospital follow up for CHF. He had a hgb of 7.7 and was rechecked and it was 8.0. Reports mild fatigue, mild SOB.  Congestive Heart Failure Presents for follow-up visit. Associated symptoms include fatigue. Pertinent negatives include no edema.      Review of Systems  Constitutional:  Positive for fatigue.  All other systems reviewed and are negative.      Objective:   Physical Exam Vitals reviewed.  Constitutional:      General: He is not in acute distress.    Appearance: He is well-developed.  HENT:     Head: Normocephalic.     Right Ear: Tympanic membrane normal.     Left Ear: Tympanic membrane normal.  Eyes:     General:        Right eye: No discharge.        Left eye: No discharge.     Pupils: Pupils are equal, round, and reactive to light.  Neck:     Thyroid: No thyromegaly.  Cardiovascular:     Rate and Rhythm: Normal rate and regular rhythm.     Heart sounds: Normal heart sounds. No murmur heard. Pulmonary:     Effort: Pulmonary effort is normal. No respiratory distress.     Breath sounds: Normal breath sounds. No wheezing.  Abdominal:     General: Bowel sounds are normal. There is no distension.     Palpations: Abdomen is soft.     Tenderness: There is no abdominal tenderness.  Musculoskeletal:        General: No tenderness. Normal range of motion.     Cervical back: Normal range of motion and neck supple.  Skin:    General: Skin is warm and dry.     Coloration: Skin is pale.     Findings: No erythema or rash.  Neurological:     Mental Status: He is alert and oriented to person, place, and time.     Cranial Nerves: No cranial nerve deficit.     Deep Tendon Reflexes: Reflexes are normal and symmetric.   Psychiatric:        Behavior: Behavior normal.        Thought Content: Thought content normal.        Judgment: Judgment normal.      BP 106/61   Pulse 70   Temp (!) 96 F (35.6 C) (Temporal)   Ht 5' 8"$  (1.727 m)   Wt 129 lb 6.4 oz (58.7 kg)   BMI 19.68 kg/m       Assessment & Plan:  [name] comes in today with chief complaint of Follow-up (LOW HEMOGLOBIN )   Diagnosis and orders addressed:  1. Low hemoglobin - Hemoglobin, fingerstick - CMP14+EGFR - CBC with Differential/Platelet  2. Essential hypertension  3. Chronic systolic heart failure (HCC)  Continue Demadex 20 mg  Low salt diet Continue to weigh daily   Labs pending Health Maintenance reviewed Diet and exercise encouraged  Follow up plan: Keep chronic follow up  Evelina Dun, FNP

## 2022-08-08 NOTE — Patient Instructions (Signed)

## 2022-08-09 LAB — CMP14+EGFR
ALT: 14 IU/L (ref 0–44)
AST: 24 IU/L (ref 0–40)
Albumin/Globulin Ratio: 1.2 (ref 1.2–2.2)
Albumin: 4.1 g/dL (ref 3.8–4.8)
Alkaline Phosphatase: 184 IU/L — ABNORMAL HIGH (ref 44–121)
BUN/Creatinine Ratio: 19 (ref 10–24)
BUN: 40 mg/dL — ABNORMAL HIGH (ref 8–27)
Bilirubin Total: 0.3 mg/dL (ref 0.0–1.2)
CO2: 18 mmol/L — ABNORMAL LOW (ref 20–29)
Calcium: 9.6 mg/dL (ref 8.6–10.2)
Chloride: 101 mmol/L (ref 96–106)
Creatinine, Ser: 2.07 mg/dL — ABNORMAL HIGH (ref 0.76–1.27)
Globulin, Total: 3.4 g/dL (ref 1.5–4.5)
Glucose: 104 mg/dL — ABNORMAL HIGH (ref 70–99)
Potassium: 4.6 mmol/L (ref 3.5–5.2)
Sodium: 137 mmol/L (ref 134–144)
Total Protein: 7.5 g/dL (ref 6.0–8.5)
eGFR: 32 mL/min/{1.73_m2} — ABNORMAL LOW (ref >59)

## 2022-08-09 LAB — CBC WITH DIFFERENTIAL/PLATELET
Basophils Absolute: 0.1 10*3/uL (ref 0.0–0.2)
Basos: 1 %
EOS (ABSOLUTE): 0.5 10*3/uL — ABNORMAL HIGH (ref 0.0–0.4)
Eos: 6 %
Hematocrit: 25.9 % — ABNORMAL LOW (ref 37.5–51.0)
Hemoglobin: 8.3 g/dL — CL (ref 13.0–17.7)
Immature Grans (Abs): 0 10*3/uL (ref 0.0–0.1)
Immature Granulocytes: 0 %
Lymphocytes Absolute: 2.2 10*3/uL (ref 0.7–3.1)
Lymphs: 27 %
MCH: 30.1 pg (ref 26.6–33.0)
MCHC: 32 g/dL (ref 31.5–35.7)
MCV: 94 fL (ref 79–97)
Monocytes Absolute: 1.3 10*3/uL — ABNORMAL HIGH (ref 0.1–0.9)
Monocytes: 16 %
Neutrophils Absolute: 4 10*3/uL (ref 1.4–7.0)
Neutrophils: 50 %
Platelets: 372 10*3/uL (ref 150–450)
RBC: 2.76 x10E6/uL — ABNORMAL LOW (ref 4.14–5.80)
RDW: 15.6 % — ABNORMAL HIGH (ref 11.6–15.4)
WBC: 8 10*3/uL (ref 3.4–10.8)

## 2022-08-10 DIAGNOSIS — Z515 Encounter for palliative care: Secondary | ICD-10-CM | POA: Diagnosis not present

## 2022-08-10 DIAGNOSIS — E8770 Fluid overload, unspecified: Secondary | ICD-10-CM | POA: Diagnosis not present

## 2022-08-10 DIAGNOSIS — R059 Cough, unspecified: Secondary | ICD-10-CM | POA: Diagnosis not present

## 2022-08-10 DIAGNOSIS — I5032 Chronic diastolic (congestive) heart failure: Secondary | ICD-10-CM | POA: Diagnosis not present

## 2022-08-10 DIAGNOSIS — K5900 Constipation, unspecified: Secondary | ICD-10-CM | POA: Diagnosis not present

## 2022-08-10 DIAGNOSIS — R0600 Dyspnea, unspecified: Secondary | ICD-10-CM | POA: Diagnosis not present

## 2022-08-10 DIAGNOSIS — R5383 Other fatigue: Secondary | ICD-10-CM | POA: Diagnosis not present

## 2022-08-10 DIAGNOSIS — R531 Weakness: Secondary | ICD-10-CM | POA: Diagnosis not present

## 2022-08-13 ENCOUNTER — Other Ambulatory Visit: Payer: Self-pay | Admitting: Family

## 2022-08-13 DIAGNOSIS — K219 Gastro-esophageal reflux disease without esophagitis: Secondary | ICD-10-CM

## 2022-08-15 ENCOUNTER — Other Ambulatory Visit: Payer: Self-pay | Admitting: *Deleted

## 2022-08-15 DIAGNOSIS — N401 Enlarged prostate with lower urinary tract symptoms: Secondary | ICD-10-CM

## 2022-08-15 MED ORDER — TAMSULOSIN HCL 0.4 MG PO CAPS: 0.4000 mg | ORAL_CAPSULE | Freq: Every day | ORAL | 0 refills | Status: DC

## 2022-08-16 DIAGNOSIS — M7989 Other specified soft tissue disorders: Secondary | ICD-10-CM | POA: Insufficient documentation

## 2022-08-16 NOTE — Progress Notes (Unsigned)
Cardiology Office Note   Date:  08/18/2022   ID:  [name], [name] 10-24-1940, MRN [id number]  PCP:  Sharion Balloon, FNP  Cardiologist:   Minus Breeding, MD   Chief Complaint  Patient presents with   Shortness of Breath      History of Present Illness: [name] is a 82 y.o. male who presents for follow up of non ischemic cardiomyopathy and atrial fib.  I saw him recently with increased leg edema.  He had a stroke Dec 2023.  He had resolution of hemiparesis.  He was hospitalized at [facility] with volume overload.  He had an EF of 50 - 55% on echo.    There was moderate TR.  He was managed for acute on chronic diastolic HF.   He had AKI.    At the last visit he had low BPs and increased edema.  I held his Norvasc and changed Lasix to Torsemide.  He returns for follow up.    He is doing much better since diuresis.  His daughter gave him 40 mg of torsemide but then he lost 5 pounds quickly so she did that for about 5 days.  He went down to 20 mg and he is still losing weight so she is now has him on 10 mg daily.  He feels much better.  His much more mobile.  He is not staggering and not falling.  He is not having chest pressure, neck or arm discomfort.  Is not having any palpitations, presyncope or syncope.  His breathing is better and he is not having any PND or orthopnea.   Past Medical History:  Diagnosis Date   Anxiety    Arthritis    CAD (coronary artery disease)    Nonobstructive cath 2012.  PCI to RCA 2003   Carotid artery disease (HCC)    Cataract    CHF (congestive heart failure) (HCC)    COPD (chronic obstructive pulmonary disease) (HCC)    Depression    Dyslipidemia    EtOH dependence (HCC)    GERD (gastroesophageal reflux disease)    Hyperlipidemia    Hypertension    Insomnia    Nonischemic cardiomyopathy (HCC)    EF was 20% (60% last echo 2015)   Pacemaker    and defibrillator   PAF (paroxysmal atrial fibrillation) ([place])    Peptic ulcer  disease    Tobacco abuse     Past Surgical History:  Procedure Laterality Date   BIV ICD GENERTAOR CHANGE OUT N/A 03/06/2014   [employer] Gen change by Dr Rayann Heman Beckie Salts CRT-D with LV1 header)   biventricular defibrillator implantation     [employer] and pacemaker   CATARACT EXTRACTION W/PHACO Right 12/10/2015   Procedure: CATARACT EXTRACTION PHACO AND INTRAOCULAR LENS PLACEMENT RIGHT EYE; CDE: 19.30;  Surgeon: Tonny Branch, MD;  Location: [facility];  Service: Ophthalmology;  Laterality: Right;   CATARACT EXTRACTION W/PHACO Left 01/14/2016   Procedure: CATARACT EXTRACTION PHACO AND INTRAOCULAR LENS PLACEMENT LEFT EYE; CDE:  8.03;  Surgeon: Tonny Branch, MD;  Location: [facility];  Service: Ophthalmology;  Laterality: Left;   COLONOSCOPY     EYE SURGERY     Ulcer surgery     repair of stomach ulcer     Current Outpatient Medications  Medication Sig Dispense Refill   albuterol (VENTOLIN HFA) 108 (90 Base) MCG/ACT inhaler INHALE 1 PUFF BY MOUTH EVERY 6 HOURS AS NEEDED FOR SHORTNESS OF BREATH 9 g  0   ALPRAZolam (XANAX) 1 MG tablet TAKE 1 TABLET BY MOUTH TWICE DAILY . APPOINTMENT REQUIRED FOR FUTURE REFILLS 60 tablet 2   apixaban (ELIQUIS) 5 MG TABS tablet Take 1 tablet by mouth twice daily 60 tablet 5   cetirizine (ZYRTEC) 10 MG tablet Take 10 mg by mouth daily as needed for allergies.     KLOR-CON M20 20 MEQ tablet Take 1 tablet (20 mEq total) by mouth daily. 90 tablet 1   lisinopril (ZESTRIL) 40 MG tablet Take 1 tablet by mouth once daily 90 tablet 0   metoprolol tartrate (LOPRESSOR) 100 MG tablet Take 1 tablet by mouth twice daily 180 tablet 0   Multiple Vitamin (MULTIVITAMIN) capsule Take 1 capsule by mouth daily.     pantoprazole (PROTONIX) 40 MG tablet Take 1 tablet by mouth once daily 90 tablet 0   pravastatin (PRAVACHOL) 80 MG tablet Take 1 tablet (80 mg total) by mouth every evening. 90 tablet 3   pregabalin (LYRICA) 50 MG capsule Take 1 capsule (50 mg total) by mouth 2 (two)  times daily. 60 capsule 2   tamsulosin (FLOMAX) 0.4 MG CAPS capsule Take 1 capsule (0.4 mg total) by mouth daily. 90 capsule 0   torsemide (DEMADEX) 20 MG tablet Take 2 tablets (40 mg total) by mouth daily. (Patient taking differently: Take 20 mg by mouth daily.) 180 tablet 3   No current facility-administered medications for this visit.    Allergies:   Gabapentin, Sudafed [pseudoephedrine hcl], Prednisone, and Avelox [moxifloxacin hcl in nacl]    ROS:  Please see the history of present illness.   Otherwise, review of systems are positive for none.   All other systems are reviewed and negative.    PHYSICAL EXAM: VS:  BP (!) 143/78   Pulse 70   Ht '5\' 8"'$  (1.727 m)   Wt 133 lb (60.3 kg)   SpO2 97%   BMI 20.22 kg/m  , BMI Body mass index is 20.22 kg/m. GEN:  No distress NECK:  No jugular venous distention at 90 degrees, waveform within normal limits, carotid upstroke brisk and symmetric, no bruits, no thyromegaly LYMPHATICS:  No cervical adenopathy LUNGS:  Clear to auscultation bilaterally BACK:  No CVA tenderness CHEST:  Unremarkable HEART:  S1 and S2 within normal limits, no S3, no S4, no clicks, no rubs, soft apical systolic murmur radiating slightly at the aortic outflow tract, no diastolic murmurs ABD:  Positive bowel sounds normal in frequency in pitch, no bruits, no rebound, no guarding, unable to assess midline mass or bruit with the patient seated. EXT:  2 plus pulses throughout, moderate edema, no cyanosis no clubbing SKIN:  No rashes no nodules NEURO:  Cranial nerves II through XII grossly intact, motor grossly intact throughout PSYCH:  Cognitively intact, oriented to person place and time   EKG:  EKG is not ordered today.    Recent Labs: 05/26/2022: Magnesium 2.0 07/18/2022: BNP 746.9 08/08/2022: ALT 14; BUN 40; Creatinine, Ser 2.07; Hemoglobin 8.3; Platelets 372; Potassium 4.6; Sodium 137    Lipid Panel    Component Value Date/Time   CHOL 154 05/26/2022 0933    TRIG 98 05/26/2022 0933   TRIG CANCELED 01/31/2014 1618   HDL 55 05/26/2022 0933   HDL CANCELED 01/31/2014 1618   CHOLHDL 2.8 05/26/2022 0933   LDLCALC 81 05/26/2022 0933      Wt Readings from Last 3 Encounters:  08/18/22 133 lb (60.3 kg)  08/08/22 129 lb 6.4 oz (58.7 kg)  08/02/22  134 lb (60.8 kg)      Other studies Reviewed: Additional studies/ records that were reviewed today include: None Review of the above records demonstrates:  NA   ASSESSMENT AND PLAN:  PAF:    [name] has a CHA2DS2 - VASc score of 5.  He tolerates anticoagulation.  No change in therapy.  ACUTE ON CHRONIC SYSTOLIC AND DIASTOLIC HF: I will check a basic metabolic profile.  For now he will remain on the meds as listed and his daughter can expertly dose adjust his diuretic.  VENTRICULAR TACHYCARDIA:    Up to date with device follow up.    CARDIOMOPATHY:  His EF was low normal.  However, he would not tolerate med titration with low blood pressures.  TOBACCO ABUSE:  He understands the need to quit smoking.  DYSLIPIDEMIA:   LDL was 81.     HTN: His blood pressure is low and I backed off on his overloaded pain.  It is a little high today but I am going to allow a little permissive hypertension.   ETOH:   He apparently is not drinking.   SLEEP APNEA: He is using   BiPAP.      Current medicines are reviewed at length with the patient today.  The patient does not have concerns regarding medicines.  The following changes have been made: None  Labs/ tests ordered today include:   Orders Placed This Encounter  Procedures   Basic metabolic panel     Disposition:   FU with me in 3 months.   Signed, Minus Breeding, MD  08/18/2022 10:11 AM    [place]

## 2022-08-18 ENCOUNTER — Ambulatory Visit: Payer: Medicare Other | Attending: Cardiology | Admitting: Cardiology

## 2022-08-18 ENCOUNTER — Encounter: Payer: Self-pay | Admitting: Cardiology

## 2022-08-18 VITALS — BP 143/78 | HR 70 | Ht 68.0 in | Wt 133.0 lb

## 2022-08-18 DIAGNOSIS — I5033 Acute on chronic diastolic (congestive) heart failure: Secondary | ICD-10-CM | POA: Diagnosis not present

## 2022-08-18 DIAGNOSIS — M7989 Other specified soft tissue disorders: Secondary | ICD-10-CM | POA: Diagnosis not present

## 2022-08-18 NOTE — Patient Instructions (Signed)
Medication Instructions:  Your physician recommends that you continue on your current medications as directed. Please refer to the Current Medication list given to you today.  *If you need a refill on your cardiac medications before your next appointment, please call your pharmacy*   Lab Work: BMET today  If you have labs (blood work) drawn today and your tests are completely normal, you will receive your results only by: [place] (if you have MyChart) OR A paper copy in the mail If you have any lab test that is abnormal or we need to change your treatment, we will call you to review the results.  Follow-Up: At [facility], you and your health needs are our priority.  As part of our continuing mission to provide you with exceptional heart care, we have created designated Provider Care Teams.  These Care Teams include your primary Cardiologist (physician) and Advanced Practice Providers (APPs -  Physician Assistants and Nurse Practitioners) who all work together to provide you with the care you need, when you need it.  We recommend signing up for the patient portal called "MyChart".  Sign up information is provided on this After Visit Summary.  MyChart is used to connect with patients for Virtual Visits (Telemedicine).  Patients are able to view lab/test results, encounter notes, upcoming appointments, etc.  Non-urgent messages can be sent to your provider as well.   To learn more about what you can do with MyChart, go to [email or web address].    Your next appointment:   3 month(s) in [place]  Provider:   Minus Breeding, MD

## 2022-08-19 ENCOUNTER — Encounter: Payer: Medicare Other | Admitting: Internal Medicine

## 2022-08-19 ENCOUNTER — Ambulatory Visit: Payer: Medicare Other | Attending: Internal Medicine | Admitting: Cardiovascular Disease

## 2022-08-19 ENCOUNTER — Encounter: Payer: Self-pay | Admitting: Cardiovascular Disease

## 2022-08-19 ENCOUNTER — Telehealth: Payer: Self-pay | Admitting: *Deleted

## 2022-08-19 VITALS — BP 136/72 | HR 70 | Ht 67.0 in | Wt 130.0 lb

## 2022-08-19 DIAGNOSIS — I5022 Chronic systolic (congestive) heart failure: Secondary | ICD-10-CM | POA: Diagnosis not present

## 2022-08-19 DIAGNOSIS — I480 Paroxysmal atrial fibrillation: Secondary | ICD-10-CM | POA: Diagnosis not present

## 2022-08-19 DIAGNOSIS — E875 Hyperkalemia: Secondary | ICD-10-CM

## 2022-08-19 LAB — CUP PACEART INCLINIC DEVICE CHECK
Date Time Interrogation Session: 20240301133309
HighPow Impedance: 48 Ohm
Implantable Lead Connection Status: 753985
Implantable Lead Connection Status: 753985
Implantable Lead Connection Status: 753985
Implantable Lead Implant Date: 20040312
Implantable Lead Implant Date: 20040312
Implantable Lead Implant Date: 20040312
Implantable Lead Location: 753858
Implantable Lead Location: 753859
Implantable Lead Location: 753860
Implantable Lead Model: 158
Implantable Lead Model: 4087
Implantable Lead Model: 4513
Implantable Lead Serial Number: 129158
Implantable Lead Serial Number: 209446
Implantable Lead Serial Number: 407264
Implantable Pulse Generator Implant Date: 20150917
Lead Channel Impedance Value: 1060 Ohm
Lead Channel Impedance Value: 518 Ohm
Lead Channel Impedance Value: 632 Ohm
Lead Channel Pacing Threshold Amplitude: 0.8 V
Lead Channel Pacing Threshold Amplitude: 1.2 V
Lead Channel Pacing Threshold Amplitude: 1.9 V
Lead Channel Pacing Threshold Pulse Width: 0.4 ms
Lead Channel Pacing Threshold Pulse Width: 0.4 ms
Lead Channel Pacing Threshold Pulse Width: 0.8 ms
Lead Channel Sensing Intrinsic Amplitude: 0.7 mV
Lead Channel Sensing Intrinsic Amplitude: 16 mV
Lead Channel Sensing Intrinsic Amplitude: 24.4 mV
Lead Channel Setting Pacing Amplitude: 2 V
Lead Channel Setting Pacing Amplitude: 2.5 V
Lead Channel Setting Pacing Amplitude: 3 V
Lead Channel Setting Pacing Pulse Width: 0.4 ms
Lead Channel Setting Pacing Pulse Width: 0.8 ms
Lead Channel Setting Sensing Sensitivity: 0.6 mV
Lead Channel Setting Sensing Sensitivity: 1 mV
Pulse Gen Serial Number: 106298

## 2022-08-19 LAB — BASIC METABOLIC PANEL
BUN/Creatinine Ratio: 19 (ref 10–24)
BUN: 33 mg/dL — ABNORMAL HIGH (ref 8–27)
CO2: 20 mmol/L (ref 20–29)
Calcium: 9.4 mg/dL (ref 8.6–10.2)
Chloride: 99 mmol/L (ref 96–106)
Creatinine, Ser: 1.7 mg/dL — ABNORMAL HIGH (ref 0.76–1.27)
Glucose: 66 mg/dL — ABNORMAL LOW (ref 70–99)
Potassium: 5.7 mmol/L — ABNORMAL HIGH (ref 3.5–5.2)
Sodium: 135 mmol/L (ref 134–144)
eGFR: 41 mL/min/{1.73_m2} — ABNORMAL LOW (ref >59)

## 2022-08-19 MED ORDER — LOKELMA 5 G PO PACK: 5.0000 g | PACK | Freq: Once | ORAL | 0 refills | Status: AC

## 2022-08-19 NOTE — Telephone Encounter (Signed)
-----   Message from Minus Breeding, MD sent at 08/19/2022  8:04 AM EST ----- Please stop the potassium and repeat a stat potassium today.  If this is elevated he will need treatment.

## 2022-08-19 NOTE — Telephone Encounter (Signed)
Spoke with pt daughter, the patient had already taken his potassium prior to my call. Per dr Percival Spanish, script for [facility] sent to the patients pharmacy for 1 time dose. He will have a bmp Monday and he will not take any potassium until we tell him to.

## 2022-08-19 NOTE — Progress Notes (Signed)
PCP: Sharion Balloon, FNP Primary Cardiologist: Dr Percival Spanish Primary EP: Dr Theressa Millard is a 82 y.o. male who presents today for routine electrophysiology followup.  Since last being seen in our clinic, the patient reports doing very well. He had a CHF exacerbation, but this has resolved fairly quickly with diuretics.  He saw Dr. Percival Spanish in clinic yesterday.   Today, he denies symptoms of palpitations, chest pain, shortness of breath,  lower extremity edema, dizziness, presyncope, syncope, or ICD shocks.  he has no device related complaints -- no new tenderness, drainage, redness. The patient is otherwise without complaint today.   Past Medical History:  Diagnosis Date   Anxiety    Arthritis    CAD (coronary artery disease)    Nonobstructive cath 2012.  PCI to RCA 2003   Carotid artery disease (HCC)    Cataract    CHF (congestive heart failure) (HCC)    COPD (chronic obstructive pulmonary disease) (HCC)    Depression    Dyslipidemia    EtOH dependence (HCC)    GERD (gastroesophageal reflux disease)    Hyperlipidemia    Hypertension    Insomnia    Nonischemic cardiomyopathy (HCC)    EF was 20% (60% last echo 2015)   Pacemaker    and defibrillator   PAF (paroxysmal atrial fibrillation) ([place])    Peptic ulcer disease    Tobacco abuse    Past Surgical History:  Procedure Laterality Date   BIV ICD GENERTAOR CHANGE OUT N/A 03/06/2014   [employer] Gen change by Dr Rayann Heman Beckie Salts CRT-D with LV1 header)   biventricular defibrillator implantation     [employer] and pacemaker   CATARACT EXTRACTION W/PHACO Right 12/10/2015   Procedure: CATARACT EXTRACTION PHACO AND INTRAOCULAR LENS PLACEMENT RIGHT EYE; CDE: 19.30;  Surgeon: Tonny Branch, MD;  Location: [facility];  Service: Ophthalmology;  Laterality: Right;   CATARACT EXTRACTION W/PHACO Left 01/14/2016   Procedure: CATARACT EXTRACTION PHACO AND INTRAOCULAR LENS PLACEMENT LEFT EYE; CDE:  8.03;  Surgeon: Tonny Branch, MD;  Location: [facility];  Service: Ophthalmology;  Laterality: Left;   COLONOSCOPY     EYE SURGERY     Ulcer surgery     repair of stomach ulcer    ROS- all systems are reviewed and negative except as per HPI above  Current Outpatient Medications  Medication Sig Dispense Refill   albuterol (VENTOLIN HFA) 108 (90 Base) MCG/ACT inhaler INHALE 1 PUFF BY MOUTH EVERY 6 HOURS AS NEEDED FOR SHORTNESS OF BREATH 9 g 0   ALPRAZolam (XANAX) 1 MG tablet TAKE 1 TABLET BY MOUTH TWICE DAILY . APPOINTMENT REQUIRED FOR FUTURE REFILLS 60 tablet 2   apixaban (ELIQUIS) 5 MG TABS tablet Take 1 tablet by mouth twice daily 60 tablet 5   cetirizine (ZYRTEC) 10 MG tablet Take 10 mg by mouth daily as needed for allergies.     KLOR-CON M20 20 MEQ tablet Take 1 tablet (20 mEq total) by mouth daily. 90 tablet 1   lisinopril (ZESTRIL) 40 MG tablet Take 1 tablet by mouth once daily 90 tablet 0   metoprolol tartrate (LOPRESSOR) 100 MG tablet Take 1 tablet by mouth twice daily 180 tablet 0   Multiple Vitamin (MULTIVITAMIN) capsule Take 1 capsule by mouth daily.     pantoprazole (PROTONIX) 40 MG tablet Take 1 tablet by mouth once daily 90 tablet 0   pravastatin (PRAVACHOL) 80 MG tablet Take 1 tablet (80 mg total) by mouth every evening. [place]  tablet 3   pregabalin (LYRICA) 50 MG capsule Take 1 capsule (50 mg total) by mouth 2 (two) times daily. 60 capsule 2   tamsulosin (FLOMAX) 0.4 MG CAPS capsule Take 1 capsule (0.4 mg total) by mouth daily. 90 capsule 0   torsemide (DEMADEX) 20 MG tablet Take 2 tablets (40 mg total) by mouth daily. (Patient taking differently: Take 20 mg by mouth daily.) 180 tablet 3   No current facility-administered medications for this visit.    Physical Exam: Vitals:   08/19/22 1108  BP: 136/72  Pulse: 70  SpO2: 99%  Weight: 130 lb (59 kg)  Height: '5\' 7"'$  (1.702 m)    Gen: Appears comfortable, well-nourished CV: RRR, no dependent edema The device site is normal -- no tenderness, edema,  drainage, redness, threatened erosion. Pulm: breathing easily   ICD interrogation- reviewed in detail today,  See PACEART report    Wt Readings from Last 3 Encounters:  08/19/22 130 lb (59 kg)  08/18/22 133 lb (60.3 kg)  08/08/22 129 lb 6.4 oz (58.7 kg)   Ecg today show atrial fibrillation with BiV pacing  Assessment and Plan:  1.  Chronic systolic dysfunction/ nonischemic CM euvolemic today EF recovered with CRT Stable on an appropriate medical regimen Normal BiV ICD function See Pace Art report No changes today he is not device dependant today  2. VT Previously noted Current well controlled  3. HTN Stable No change required today  4. Paroxysmal atrial fibrillation I suspect his AF is persistent and undersensed Chads2vasc scoreis at least 5 on eliquis Labs yesterday noted   Risks, benefits and potential toxicities for medications prescribed and/or refilled reviewed with patient today.   Melida Quitter, MD 08/19/2022 11:28 AM

## 2022-08-19 NOTE — Addendum Note (Signed)
Addended by: Sharee Holster R on: 08/19/2022 04:00 PM   Modules accepted: Orders

## 2022-08-19 NOTE — Patient Instructions (Addendum)
Medication Instructions:  Continue all current medications.   Labwork: none  Testing/Procedures: none  Follow-Up: 6 months   Any Other Special Instructions Will Be Listed Below (If Applicable).   If you need a refill on your cardiac medications before your next appointment, please call your pharmacy.  

## 2022-08-22 ENCOUNTER — Other Ambulatory Visit: Payer: Medicare Other

## 2022-08-22 DIAGNOSIS — E875 Hyperkalemia: Secondary | ICD-10-CM | POA: Diagnosis not present

## 2022-08-23 LAB — BASIC METABOLIC PANEL
BUN/Creatinine Ratio: 23 (ref 10–24)
BUN: 41 mg/dL — ABNORMAL HIGH (ref 8–27)
CO2: 17 mmol/L — ABNORMAL LOW (ref 20–29)
Calcium: 8.7 mg/dL (ref 8.6–10.2)
Chloride: 101 mmol/L (ref 96–106)
Creatinine, Ser: 1.78 mg/dL — ABNORMAL HIGH (ref 0.76–1.27)
Glucose: 100 mg/dL — ABNORMAL HIGH (ref 70–99)
Potassium: 3.8 mmol/L (ref 3.5–5.2)
Sodium: 139 mmol/L (ref 134–144)
eGFR: 38 mL/min/{1.73_m2} — ABNORMAL LOW (ref >59)

## 2022-08-24 NOTE — Progress Notes (Signed)
Remote ICD transmission.   

## 2022-08-29 ENCOUNTER — Other Ambulatory Visit: Payer: Self-pay | Admitting: Cardiology

## 2022-08-29 DIAGNOSIS — I10 Essential (primary) hypertension: Secondary | ICD-10-CM

## 2022-08-31 ENCOUNTER — Ambulatory Visit: Payer: Medicare Other | Admitting: Cardiology

## 2022-09-07 ENCOUNTER — Other Ambulatory Visit: Payer: Self-pay | Admitting: Cardiology

## 2022-09-07 DIAGNOSIS — I10 Essential (primary) hypertension: Secondary | ICD-10-CM

## 2022-09-08 ENCOUNTER — Telehealth: Payer: Self-pay | Admitting: Family

## 2022-09-08 NOTE — Telephone Encounter (Signed)
Called patient to schedule Medicare Annual Wellness Visit (AWV). Unable to reach patient.  Last date of AWV: 04/21/2021  Please schedule an appointment at any time with either Mickel Baas or [place], [facility]. .  If any questions, please contact me at [phone].  Thank you,  Colletta [place],  [facility] Direct Dial ??[phone]

## 2022-09-15 DIAGNOSIS — I10 Essential (primary) hypertension: Secondary | ICD-10-CM | POA: Diagnosis not present

## 2022-09-15 DIAGNOSIS — R0600 Dyspnea, unspecified: Secondary | ICD-10-CM | POA: Diagnosis not present

## 2022-09-15 DIAGNOSIS — I5032 Chronic diastolic (congestive) heart failure: Secondary | ICD-10-CM | POA: Diagnosis not present

## 2022-09-15 DIAGNOSIS — D649 Anemia, unspecified: Secondary | ICD-10-CM | POA: Diagnosis not present

## 2022-09-15 DIAGNOSIS — Z515 Encounter for palliative care: Secondary | ICD-10-CM | POA: Diagnosis not present

## 2022-09-15 DIAGNOSIS — I4891 Unspecified atrial fibrillation: Secondary | ICD-10-CM | POA: Diagnosis not present

## 2022-09-20 ENCOUNTER — Ambulatory Visit (INDEPENDENT_AMBULATORY_CARE_PROVIDER_SITE_OTHER): Payer: Medicare Other | Admitting: Family

## 2022-09-20 ENCOUNTER — Encounter: Payer: Self-pay | Admitting: Family

## 2022-09-20 VITALS — BP 124/65 | HR 73 | Temp 97.5°F | Ht 67.0 in | Wt 141.2 lb

## 2022-09-20 DIAGNOSIS — I509 Heart failure, unspecified: Secondary | ICD-10-CM

## 2022-09-20 DIAGNOSIS — Z0001 Encounter for general adult medical examination with abnormal findings: Secondary | ICD-10-CM

## 2022-09-20 DIAGNOSIS — F17200 Nicotine dependence, unspecified, uncomplicated: Secondary | ICD-10-CM

## 2022-09-20 DIAGNOSIS — G621 Alcoholic polyneuropathy: Secondary | ICD-10-CM

## 2022-09-20 DIAGNOSIS — I6930 Unspecified sequelae of cerebral infarction: Secondary | ICD-10-CM

## 2022-09-20 DIAGNOSIS — K219 Gastro-esophageal reflux disease without esophagitis: Secondary | ICD-10-CM

## 2022-09-20 DIAGNOSIS — J439 Emphysema, unspecified: Secondary | ICD-10-CM | POA: Diagnosis not present

## 2022-09-20 DIAGNOSIS — G4700 Insomnia, unspecified: Secondary | ICD-10-CM

## 2022-09-20 DIAGNOSIS — I480 Paroxysmal atrial fibrillation: Secondary | ICD-10-CM | POA: Diagnosis not present

## 2022-09-20 DIAGNOSIS — E785 Hyperlipidemia, unspecified: Secondary | ICD-10-CM

## 2022-09-20 DIAGNOSIS — R3914 Feeling of incomplete bladder emptying: Secondary | ICD-10-CM

## 2022-09-20 DIAGNOSIS — I110 Hypertensive heart disease with heart failure: Secondary | ICD-10-CM

## 2022-09-20 DIAGNOSIS — I25119 Atherosclerotic heart disease of native coronary artery with unspecified angina pectoris: Secondary | ICD-10-CM | POA: Diagnosis not present

## 2022-09-20 DIAGNOSIS — F1010 Alcohol abuse, uncomplicated: Secondary | ICD-10-CM

## 2022-09-20 DIAGNOSIS — I700 Atherosclerosis of aorta: Secondary | ICD-10-CM

## 2022-09-20 DIAGNOSIS — Z0000 Encounter for general adult medical examination without abnormal findings: Secondary | ICD-10-CM

## 2022-09-20 DIAGNOSIS — I10 Essential (primary) hypertension: Secondary | ICD-10-CM

## 2022-09-20 DIAGNOSIS — F411 Generalized anxiety disorder: Secondary | ICD-10-CM | POA: Diagnosis not present

## 2022-09-20 DIAGNOSIS — L409 Psoriasis, unspecified: Secondary | ICD-10-CM

## 2022-09-20 DIAGNOSIS — N401 Enlarged prostate with lower urinary tract symptoms: Secondary | ICD-10-CM

## 2022-09-20 MED ORDER — LISINOPRIL 40 MG PO TABS: 40.0000 mg | ORAL_TABLET | Freq: Every day | ORAL | 1 refills | Status: DC

## 2022-09-20 MED ORDER — APIXABAN 5 MG PO TABS: 5.0000 mg | ORAL_TABLET | Freq: Two times a day (BID) | ORAL | 5 refills | Status: DC

## 2022-09-20 MED ORDER — METOPROLOL TARTRATE 100 MG PO TABS
100.0000 mg | ORAL_TABLET | Freq: Two times a day (BID) | ORAL | 1 refills | Status: DC
Start: 2022-09-20 — End: 2023-06-07

## 2022-09-20 MED ORDER — ALPRAZOLAM 1 MG PO TABS: ORAL_TABLET | ORAL | 2 refills | Status: DC

## 2022-09-20 MED ORDER — PANTOPRAZOLE SODIUM 40 MG PO TBEC
40.0000 mg | DELAYED_RELEASE_TABLET | Freq: Every day | ORAL | 1 refills | Status: DC
Start: 2022-09-20 — End: 2022-12-20

## 2022-09-20 MED ORDER — PREGABALIN 50 MG PO CAPS
50.0000 mg | ORAL_CAPSULE | Freq: Two times a day (BID) | ORAL | 2 refills | Status: DC
Start: 2022-09-20 — End: 2022-12-20

## 2022-09-20 MED ORDER — TAMSULOSIN HCL 0.4 MG PO CAPS
0.4000 mg | ORAL_CAPSULE | Freq: Every day | ORAL | 0 refills | Status: DC
Start: 2022-09-20 — End: 2022-11-21

## 2022-09-20 NOTE — Patient Instructions (Signed)
Health Maintenance After Age 80 After age 80, you are at a higher risk for certain long-term diseases and infections as well as injuries from falls. Falls are a major cause of broken bones and head injuries in people who are older than age 80. Getting regular preventive care can help to keep you healthy and well. Preventive care includes getting regular testing and making lifestyle changes as recommended by your health care provider. Talk with your health care provider about: Which screenings and tests you should have. A screening is a test that checks for a disease when you have no symptoms. A diet and exercise plan that is right for you. What should I know about screenings and tests to prevent falls? Screening and testing are the best ways to find a health problem early. Early diagnosis and treatment give you the best chance of managing medical conditions that are common after age 80. Certain conditions and lifestyle choices may make you more likely to have a fall. Your health care provider may recommend: Regular vision checks. Poor vision and conditions such as cataracts can make you more likely to have a fall. If you wear glasses, make sure to get your prescription updated if your vision changes. Medicine review. Work with your health care provider to regularly review all of the medicines you are taking, including over-the-counter medicines. Ask your health care provider about any side effects that may make you more likely to have a fall. Tell your health care provider if any medicines that you take make you feel dizzy or sleepy. Strength and balance checks. Your health care provider may recommend certain tests to check your strength and balance while standing, walking, or changing positions. Foot health exam. Foot pain and numbness, as well as not wearing proper footwear, can make you more likely to have a fall. Screenings, including: Osteoporosis screening. Osteoporosis is a condition that causes  the bones to get weaker and break more easily. Blood pressure screening. Blood pressure changes and medicines to control blood pressure can make you feel dizzy. Depression screening. You may be more likely to have a fall if you have a fear of falling, feel depressed, or feel unable to do activities that you used to do. Alcohol use screening. Using too much alcohol can affect your balance and may make you more likely to have a fall. Follow these instructions at home: Lifestyle Do not drink alcohol if: Your health care provider tells you not to drink. If you drink alcohol: Limit how much you have to: 0-1 drink a day for women. 0-2 drinks a day for men. Know how much alcohol is in your drink. In the [place] one drink equals one 12 oz bottle of beer (355 mL), one 5 oz glass of wine (148 mL), or one 1 oz glass of hard liquor (44 mL). Do not use any products that contain nicotine or tobacco. These products include cigarettes, chewing tobacco, and vaping devices, such as e-cigarettes. If you need help quitting, ask your health care provider. Activity  Follow a regular exercise program to stay fit. This will help you maintain your balance. Ask your health care provider what types of exercise are appropriate for you. If you need a cane or walker, use it as recommended by your health care provider. Wear supportive shoes that have nonskid soles. Safety  Remove any tripping hazards, such as rugs, cords, and clutter. Install safety equipment such as grab bars in bathrooms and safety rails on stairs. Keep rooms and walkways   well-lit. General instructions Talk with your health care provider about your risks for falling. Tell your health care provider if: You fall. Be sure to tell your health care provider about all falls, even ones that seem minor. You feel dizzy, tiredness (fatigue), or off-balance. Take over-the-counter and prescription medicines only as told by your health care provider. These include  supplements. Eat a healthy diet and maintain a healthy weight. A healthy diet includes low-fat dairy products, low-fat (lean) meats, and fiber from whole grains, beans, and lots of fruits and vegetables. Stay current with your vaccines. Schedule regular health, dental, and eye exams. Summary Having a healthy lifestyle and getting preventive care can help to protect your health and wellness after age 80. Screening and testing are the best way to find a health problem early and help you avoid having a fall. Early diagnosis and treatment give you the best chance for managing medical conditions that are more common for people who are older than age 80. Falls are a major cause of broken bones and head injuries in people who are older than age 80. Take precautions to prevent a fall at home. Work with your health care provider to learn what changes you can make to improve your health and wellness and to prevent falls. This information is not intended to replace advice given to you by your health care provider. Make sure you discuss any questions you have with your health care provider. Document Revised: 10/26/2020 Document Reviewed: 10/26/2020 Elsevier Patient Education  2023 [employer]  

## 2022-09-20 NOTE — Progress Notes (Signed)
Subjective:    Patient ID: [name], male    DOB: 04/26/1941, 82 y.o.   MRN: [id number]  Chief Complaint  Patient presents with   Medical Management of Chronic Issues    WEIGHT GAIN.   Pt presents to the office today for CPE and  chronic follow up. PT is followed by Cardiologists for CAD, CHF, Atherosclerosis, Cardiomyopathy, and Ventricular tachycardia every 2 years.  PT has pacemaker and is followed annually for this.     Pt is followed by Dermatologists for psoriasis as needed.     Has OSA and uses CPAP nightly.    He  reports he quit drinking 05/13/22. He reports constant burning and aching pain of bilateral feet from alocholic peripheral neuropathy that is 4 out 10. The Lyrica 50 mg BID has greatly helped.    He has aortic atheroscleroses and takes pravastatin daily.   He had a CVA in 05/13/22, with mild left sided weakness. He is doing PT. This has greatly improved.    He has emphysema. Smoking less 1/2 a pack a day. Denies any SOB.  He has taken his demadex 20 mg daily, and has take 30 mg twice last week.      09/20/2022    9:27 AM 08/19/2022   11:08 AM 08/18/2022    9:06 AM  Last 3 Weights  Weight (lbs) 141 lb 3.2 oz 130 lb 133 lb  Weight (kg) 64.048 kg 58.968 kg 60.328 kg     Hypertension This is a chronic problem. The current episode started more than 1 year ago. The problem has been resolved since onset. The problem is controlled. Associated symptoms include anxiety, malaise/fatigue, peripheral edema (trace) and shortness of breath. Risk factors for coronary artery disease include dyslipidemia, obesity and male gender. The current treatment provides moderate improvement.  Congestive Heart Failure Presents for follow-up visit. Associated symptoms include edema, fatigue, nocturia (1) and shortness of breath. The symptoms have been stable.  Gastroesophageal Reflux He complains of belching and heartburn. This is a chronic problem. The current episode started more  than 1 year ago. The problem occurs occasionally. Associated symptoms include fatigue. He has tried a PPI for the symptoms. The treatment provided moderate relief.  Benign Prostatic Hypertrophy This is a chronic problem. The current episode started more than 1 year ago. The problem has been waxing and waning since onset. Irritative symptoms include nocturia (1). Past treatments include tamsulosin. The treatment provided moderate relief.  Anemia Presents for follow-up visit. Symptoms include malaise/fatigue.  Hyperlipidemia This is a chronic problem. The current episode started more than 1 year ago. Associated symptoms include shortness of breath. The current treatment provides mild improvement of lipids. Risk factors for coronary artery disease include dyslipidemia, hypertension, male sex and a sedentary lifestyle.  Anxiety Presents for follow-up visit. Symptoms include excessive worry, nervous/anxious behavior and shortness of breath. Patient reports no irritability. Symptoms occur most days. The severity of symptoms is moderate.   His past medical history is significant for anemia.      Review of Systems  Constitutional:  Positive for fatigue and malaise/fatigue. Negative for irritability.  Respiratory:  Positive for shortness of breath.   Gastrointestinal:  Positive for heartburn.  Genitourinary:  Positive for nocturia (1).  Psychiatric/Behavioral:  The patient is nervous/anxious.   All other systems reviewed and are negative.  Family History  Problem Relation Age of Onset   Heart failure Father    Colon polyps Sister    GI Bleed Sister  GI Bleed Brother    Heart attack Brother    Psoriasis Daughter    Arthritis/Rheumatoid Daughter    Cancer Brother    Heart disease Brother    Heart disease Brother    Colon cancer Neg Hx    Esophageal cancer Neg Hx    Rectal cancer Neg Hx    Stomach cancer Neg Hx    Social History   Socioeconomic History   Marital status: Divorced     Spouse name: Not on file   Number of children: 4   Years of education: Some college   Highest education level: Some college, no degree  Occupational History   Occupation: Retired    Fish farm manager: [employer]  Tobacco Use   Smoking status: Some Days    Packs/day: 0.25    Years: 45.00    Additional pack years: 0.00    Total pack years: 11.25    Types: Cigarettes   Smokeless tobacco: Former    Types: Chew    Quit date: 06/20/1989   Tobacco comments:    he is not interested in cessation    Patient started back smoking about 3 Cigarettes a daily  Vaping Use   Vaping Use: Never used  Substance and Sexual Activity   Alcohol use: Yes    Alcohol/week: 6.0 standard drinks of alcohol    Types: 6 Cans of beer per week    Comment: drings 4-6 beers per day   Drug use: No    Types: Marijuana    Comment: last used marijuana yesterday   Sexual activity: Not Currently    Birth control/protection: None  Other Topics Concern   Not on file  Social History Narrative   Divorced 2 sons 2 daughters   Is retired from [employer] forces logistics/support/administrative officer, he was a Building control surveyor   3 alcoholic beverages daily no caffeine no drugs he still smokes no other tobacco or drug use   Social Determinants of Radio broadcast assistant Strain: Low Risk  (04/21/2021)   Overall Financial Resource Strain (CARDIA)    Difficulty of Paying Living Expenses: Not hard at all  Food Insecurity: No Food Insecurity (04/21/2021)   Hunger Vital Sign    Worried About Running Out of Food in the Last Year: Never true    [place] in the Last Year: Never true  Transportation Needs: No Transportation Needs (04/21/2021)   PRAPARE - Hydrologist (Medical): No    Lack of Transportation (Non-Medical): No  Physical Activity: Inactive (04/21/2021)   Exercise Vital Sign    Days of Exercise per Week: 0 days    Minutes of Exercise per Session: 0 min  Stress: No Stress Concern Present (04/21/2021)   [place]    Feeling of Stress : Only a little  Social Connections: Socially Isolated (04/21/2021)   Social Connection and Isolation Panel [NHANES]    Frequency of Communication with Friends and Family: More than three times a week    Frequency of Social Gatherings with Friends and Family: More than three times a week    Attends Religious Services: Never    Marine scientist or Organizations: No    Attends Archivist Meetings: Never    Marital Status: Divorced       Objective:   Physical Exam Vitals reviewed.  Constitutional:      General: He is not in acute distress.    Appearance:  He is well-developed.  HENT:     Head: Normocephalic.  Eyes:     General:        Right eye: No discharge.        Left eye: No discharge.     Pupils: Pupils are equal, round, and reactive to light.  Neck:     Thyroid: No thyromegaly.  Cardiovascular:     Rate and Rhythm: Normal rate and regular rhythm.     Heart sounds: Normal heart sounds. No murmur heard. Pulmonary:     Effort: Pulmonary effort is normal. No respiratory distress.     Breath sounds: Normal breath sounds. No wheezing.  Abdominal:     General: Bowel sounds are normal. There is no distension.     Palpations: Abdomen is soft.     Tenderness: There is no abdominal tenderness.  Musculoskeletal:        General: No tenderness or signs of injury. Normal range of motion.     Cervical back: Normal range of motion and neck supple.     Right lower leg: Edema (2+) present.     Left lower leg: Edema (2+) present.  Skin:    General: Skin is warm and dry.     Findings: No erythema or rash.  Neurological:     Mental Status: He is alert and oriented to person, place, and time.     Cranial Nerves: No cranial nerve deficit.     Motor: Weakness present.     Deep Tendon Reflexes: Reflexes are normal and symmetric.     Comments: Mild left sided weakness   Psychiatric:         Behavior: Behavior normal.        Thought Content: Thought content normal.        Judgment: Judgment normal.       BP 124/65   Pulse 73   Temp (!) 97.5 F (36.4 C) (Temporal)   Ht 5\' 7"  (1.702 m)   Wt 141 lb 3.2 oz (64 kg)   SpO2 96%   BMI 22.12 kg/m      Assessment & Plan:  KAULIN PARROTTE comes in today with chief complaint of Medical Management of Chronic Issues (WEIGHT GAIN.)   Diagnosis and orders addressed:  1. GAD (generalized anxiety disorder) - ALPRAZolam (XANAX) 1 MG tablet; TAKE 1 TABLET BY MOUTH TWICE DAILY . APPOINTMENT REQUIRED FOR FUTURE REFILLS  Dispense: 60 tablet; Refill: 2 - CMP14+EGFR - CBC with Differential/Platelet  2. Paroxysmal atrial fibrillation - apixaban (ELIQUIS) 5 MG TABS tablet; Take 1 tablet (5 mg total) by mouth 2 (two) times daily.  Dispense: 60 tablet; Refill: 5 - CMP14+EGFR - CBC with Differential/Platelet  3. Essential hypertension - lisinopril (ZESTRIL) 40 MG tablet; Take 1 tablet (40 mg total) by mouth daily.  Dispense: 90 tablet; Refill: 1 - metoprolol tartrate (LOPRESSOR) 100 MG tablet; Take 1 tablet (100 mg total) by mouth 2 (two) times daily.  Dispense: 180 tablet; Refill: 1 - CMP14+EGFR - CBC with Differential/Platelet  4. Gastroesophageal reflux disease - pantoprazole (PROTONIX) 40 MG tablet; Take 1 tablet (40 mg total) by mouth daily.  Dispense: 90 tablet; Refill: 1 - CMP14+EGFR - CBC with Differential/Platelet  5. Alcoholic peripheral neuropathy - pregabalin (LYRICA) 50 MG capsule; Take 1 capsule (50 mg total) by mouth 2 (two) times daily.  Dispense: 60 capsule; Refill: 2 - CMP14+EGFR - CBC with Differential/Platelet  6. Benign prostatic hyperplasia with incomplete bladder emptying  - tamsulosin (FLOMAX) 0.4 MG CAPS  capsule; Take 1 capsule (0.4 mg total) by mouth daily.  Dispense: 90 capsule; Refill: 0 - CMP14+EGFR - CBC with Differential/Platelet  7. Hyperlipidemia, unspecified hyperlipidemia type -  CMP14+EGFR - CBC with Differential/Platelet - Lipid panel  8. Alcohol abuse - CMP14+EGFR - CBC with Differential/Platelet  9. TOBACCO ABUSE  - CMP14+EGFR - CBC with Differential/Platelet  10. Atherosclerosis of native coronary artery with angina pectoris, unspecified whether native or transplanted heart - CMP14+EGFR - CBC with Differential/Platelet - Lipid panel  11. Insomnia, unspecified type  - CMP14+EGFR - CBC with Differential/Platelet  12. Gastroesophageal reflux disease, unspecified whether esophagitis present - CMP14+EGFR - CBC with Differential/Platelet  13. Psoriasis  - CMP14+EGFR - CBC with Differential/Platelet  14. Late effect of cerebrovascular accident (CVA) - CMP14+EGFR - CBC with Differential/Platelet  15. Annual physical exam - CMP14+EGFR - CBC with Differential/Platelet - Lipid panel - TSH   Labs pending Patient reviewed in [place] controlled database, no flags noted. Contract and drug screen are up to date.  Health Maintenance reviewed Diet and exercise encouraged  Follow up plan: 3 months   Evelina Dun, FNP

## 2022-09-21 LAB — CBC WITH DIFFERENTIAL/PLATELET
Basophils Absolute: 0.1 10*3/uL (ref 0.0–0.2)
Basos: 1 %
EOS (ABSOLUTE): 0.2 10*3/uL (ref 0.0–0.4)
Eos: 2 %
Hematocrit: 28.7 % — ABNORMAL LOW (ref 37.5–51.0)
Hemoglobin: 9.2 g/dL — ABNORMAL LOW (ref 13.0–17.7)
Immature Grans (Abs): 0 10*3/uL (ref 0.0–0.1)
Immature Granulocytes: 0 %
Lymphocytes Absolute: 1.8 10*3/uL (ref 0.7–3.1)
Lymphs: 24 %
MCH: 30 pg (ref 26.6–33.0)
MCHC: 32.1 g/dL (ref 31.5–35.7)
MCV: 94 fL (ref 79–97)
Monocytes Absolute: 1.1 10*3/uL — ABNORMAL HIGH (ref 0.1–0.9)
Monocytes: 14 %
Neutrophils Absolute: 4.5 10*3/uL (ref 1.4–7.0)
Neutrophils: 59 %
Platelets: 248 10*3/uL (ref 150–450)
RBC: 3.07 x10E6/uL — ABNORMAL LOW (ref 4.14–5.80)
RDW: 14.9 % (ref 11.6–15.4)
WBC: 7.6 10*3/uL (ref 3.4–10.8)

## 2022-09-21 LAB — CMP14+EGFR
ALT: 13 IU/L (ref 0–44)
AST: 26 IU/L (ref 0–40)
Albumin/Globulin Ratio: 1.1 — ABNORMAL LOW (ref 1.2–2.2)
Albumin: 3.8 g/dL (ref 3.8–4.8)
Alkaline Phosphatase: 129 IU/L — ABNORMAL HIGH (ref 44–121)
BUN/Creatinine Ratio: 16 (ref 10–24)
BUN: 30 mg/dL — ABNORMAL HIGH (ref 8–27)
Bilirubin Total: 0.3 mg/dL (ref 0.0–1.2)
CO2: 21 mmol/L (ref 20–29)
Calcium: 9.4 mg/dL (ref 8.6–10.2)
Chloride: 101 mmol/L (ref 96–106)
Creatinine, Ser: 1.82 mg/dL — ABNORMAL HIGH (ref 0.76–1.27)
Globulin, Total: 3.4 g/dL (ref 1.5–4.5)
Glucose: 63 mg/dL — ABNORMAL LOW (ref 70–99)
Potassium: 4.1 mmol/L (ref 3.5–5.2)
Sodium: 137 mmol/L (ref 134–144)
Total Protein: 7.2 g/dL (ref 6.0–8.5)
eGFR: 37 mL/min/{1.73_m2} — ABNORMAL LOW (ref >59)

## 2022-09-21 LAB — LIPID PANEL
Chol/HDL Ratio: 3 ratio (ref 0.0–5.0)
Cholesterol, Total: 130 mg/dL (ref 100–199)
HDL: 43 mg/dL (ref >39)
LDL Chol Calc (NIH): 68 mg/dL (ref 0–99)
Triglycerides: 102 mg/dL (ref 0–149)
VLDL Cholesterol Cal: 19 mg/dL (ref 5–40)

## 2022-09-21 LAB — TSH: TSH: 0.953 u[IU]/mL (ref 0.450–4.500)

## 2022-09-29 ENCOUNTER — Telehealth: Payer: Self-pay | Admitting: Family

## 2022-09-29 NOTE — Telephone Encounter (Signed)
Called patient to schedule Medicare Annual Wellness Visit (AWV). Left message for patient to call back and schedule Medicare Annual Wellness Visit (AWV).  Last date of AWV: : 04/21/2021   Please schedule an appointment at any time with either Vernona Rieger or [place], NHA's. .  If any questions, please contact me at [phone].  Thank you,  Judeth Cornfield,  AMB Clinical Support [facility] AWV Program Direct Dial ??[phone]

## 2022-10-08 DIAGNOSIS — R220 Localized swelling, mass and lump, head: Secondary | ICD-10-CM | POA: Diagnosis not present

## 2022-10-08 DIAGNOSIS — K130 Diseases of lips: Secondary | ICD-10-CM | POA: Diagnosis not present

## 2022-10-15 DIAGNOSIS — R063 Periodic breathing: Secondary | ICD-10-CM | POA: Diagnosis not present

## 2022-10-15 DIAGNOSIS — G4737 Central sleep apnea in conditions classified elsewhere: Secondary | ICD-10-CM | POA: Diagnosis not present

## 2022-10-25 ENCOUNTER — Ambulatory Visit (INDEPENDENT_AMBULATORY_CARE_PROVIDER_SITE_OTHER): Payer: Medicare Other

## 2022-10-25 DIAGNOSIS — I428 Other cardiomyopathies: Secondary | ICD-10-CM | POA: Diagnosis not present

## 2022-10-25 LAB — CUP PACEART REMOTE DEVICE CHECK
Battery Remaining Longevity: 12 mo
Battery Remaining Percentage: 18 %
Brady Statistic RA Percent Paced: 1 %
Brady Statistic RV Percent Paced: 96 %
Date Time Interrogation Session: 20240507003100
HighPow Impedance: 52 Ohm
Implantable Lead Connection Status: 753985
Implantable Lead Connection Status: 753985
Implantable Lead Connection Status: 753985
Implantable Lead Implant Date: 20040312
Implantable Lead Implant Date: 20040312
Implantable Lead Implant Date: 20040312
Implantable Lead Location: 753858
Implantable Lead Location: 753859
Implantable Lead Location: 753860
Implantable Lead Model: 158
Implantable Lead Model: 4087
Implantable Lead Model: 4513
Implantable Lead Serial Number: 129158
Implantable Lead Serial Number: 209446
Implantable Lead Serial Number: 407264
Implantable Pulse Generator Implant Date: 20150917
Lead Channel Impedance Value: 1081 Ohm
Lead Channel Impedance Value: 505 Ohm
Lead Channel Impedance Value: 632 Ohm
Lead Channel Pacing Threshold Amplitude: 0.8 V
Lead Channel Pacing Threshold Amplitude: 1.2 V
Lead Channel Pacing Threshold Amplitude: 1.9 V
Lead Channel Pacing Threshold Pulse Width: 0.4 ms
Lead Channel Pacing Threshold Pulse Width: 0.4 ms
Lead Channel Pacing Threshold Pulse Width: 0.8 ms
Lead Channel Setting Pacing Amplitude: 2 V
Lead Channel Setting Pacing Amplitude: 2.5 V
Lead Channel Setting Pacing Amplitude: 3 V
Lead Channel Setting Pacing Pulse Width: 0.4 ms
Lead Channel Setting Pacing Pulse Width: 0.8 ms
Lead Channel Setting Sensing Sensitivity: 0.6 mV
Lead Channel Setting Sensing Sensitivity: 1 mV
Pulse Gen Serial Number: 106298

## 2022-10-27 ENCOUNTER — Other Ambulatory Visit: Payer: Self-pay | Admitting: Cardiology

## 2022-10-31 ENCOUNTER — Telehealth: Payer: Self-pay | Admitting: Family

## 2022-10-31 NOTE — Telephone Encounter (Signed)
Contacted [name] to schedule their annual wellness visit. Appointment made for 11/11/2022.  Thank you,  Judeth Cornfield,  AMB Clinical Support [facility] AWV Program Direct Dial ??[phone]

## 2022-11-08 ENCOUNTER — Encounter: Payer: Self-pay | Admitting: Cardiology

## 2022-11-08 NOTE — Telephone Encounter (Signed)
Patient has edema to bilateral lower legs.  Skin is red. Normal temperature. No pitting. He was prescribed 40 mg torsemide at hospital in February.  He has been taking 20 mg since then up until swelling started .  Has had swelling most of the last week and  half and so started 40 mg of Torsemide last Monday. Since increase , the swelling seems same.  Weight is same on the 40 mg.  He complains of being tired, and hard to take deep breath.  Mostly states "Just tired all the time". Socks leave imprint. Patient watches sodium "best he can".  No High sodium foods last few days. Elevates legs at times.  Weight today 148 since last week 4/20  140.4 4/2    141.3 3/1    130 2/29  133

## 2022-11-09 DIAGNOSIS — L858 Other specified epidermal thickening: Secondary | ICD-10-CM | POA: Diagnosis not present

## 2022-11-09 DIAGNOSIS — C4402 Squamous cell carcinoma of skin of lip: Secondary | ICD-10-CM | POA: Diagnosis not present

## 2022-11-09 NOTE — Progress Notes (Unsigned)
Cardiology Office Note:   Date:  11/10/2022  ID:  COLLAN FACCHINI, DOB 1941/01/27, MRN [id number]  History of Present Illness:   [name] is a 82 y.o. male who presents for follow up of non ischemic cardiomyopathy and atrial fib.  I saw him recently with increased leg edema.  He had a stroke Dec 2023.  He had resolution of hemiparesis.  He was hospitalized at [facility] with volume overload.  He had an EF of 50 - 55% on echo.    There was moderate TR.  He was managed for acute on chronic diastolic HF.   He had AKI.    At the last visit he had low BPs and increased edema.  I held his Norvasc and changed Lasix to Torsemide.  He returns for follow up.     He called recently with increased weight gain.  He was added to my schedule today.  He has had 15 pounds of weight gain slowly.  His daughter started giving him an extra torsemide for the last 10 days and has been taking 40 mg.  Is probably been getting a little more fluid and maybe a little more salt.  He does not keep his feet elevated is much as he should.  He has not been wearing compression stockings.  He is not having any new PND or orthopnea.  He definitely has lower extremity swelling and some abdominal distention.  He is not feeling any new palpitations, presyncope or syncope.  He is not having any chest pressure, neck or arm discomfort.  He gets around slowly with a cane because of back pain.   ROS: As stated in the HPI and negative for all other systems.  Studies Reviewed:    EKG:   NSR, ventricular pacing.  Rate 70,    Risk Assessment/Calculations:    CHA2DS2-VASc Score = 4   This indicates a 4.8% annual risk of stroke. The patient's score is based upon: CHF History: 1 HTN History: 1 Diabetes History: 0 Stroke History: 0 Vascular Disease History: 0 Age Score: 2 Gender Score: 0    Physical Exam:   VS:  BP (!) 145/60 (BP Location: Left Arm, Patient Position: Sitting, Cuff Size: Normal)   Pulse 70   Ht 5\' 8"   (1.727 m)   Wt 147 lb (66.7 kg)   SpO2 100%   BMI 22.35 kg/m    Wt Readings from Last 3 Encounters:  11/10/22 147 lb (66.7 kg)  09/20/22 141 lb 3.2 oz (64 kg)  08/19/22 130 lb (59 kg)     GEN: Well nourished, well developed in no acute distress NECK: No JVD; No carotid bruits CARDIAC: Grade RRR, no murmurs, rubs, gallops RESPIRATORY:  Clear to auscultation without rales, wheezing or rhonchi  ABDOMEN: Soft, non-tender, non-distended EXTREMITIES:    Moderate leg edema; No deformity   ASSESSMENT AND PLAN:    PAF:    Mr. [name] has a CHA2DS2 - VASc score of 4.    I did go back and I do see evidence of increased burden of fib on his most recent device interrogation.  He tolerates anticoagulation.  No change in therapy.  ACUTE ON CHRONIC SYSTOLIC AND DIASTOLIC HF:    He seems to be volume overloaded.  His daughter is great attention to him.  I am to have him take 60 mg of torsemide for the next 5 days.  I will check a basic metabolic profile today and again in about 7  days.  We talked about salt and fluid restriction.   VENTRICULAR TACHYCARDIA:    He is up-to-date with device follow-up as above.  No change in therapy.   TOBACCO ABUSE:  He understands the need to quit smoking.       DYSLIPIDEMIA:   LDL was 81 previously.  No change in therapy.  HTN: His blood pressure is at target at home.[name]  No change in therapy.  SLEEP APNEA:   He uses a BiPAP.      Signed, Rollene Rotunda, MD

## 2022-11-10 ENCOUNTER — Encounter: Payer: Self-pay | Admitting: Cardiology

## 2022-11-10 ENCOUNTER — Ambulatory Visit: Payer: Medicare Other | Attending: Cardiology | Admitting: Cardiology

## 2022-11-10 VITALS — BP 145/60 | HR 70 | Ht 68.0 in | Wt 147.0 lb

## 2022-11-10 DIAGNOSIS — M7989 Other specified soft tissue disorders: Secondary | ICD-10-CM

## 2022-11-10 DIAGNOSIS — I5033 Acute on chronic diastolic (congestive) heart failure: Secondary | ICD-10-CM | POA: Diagnosis not present

## 2022-11-10 DIAGNOSIS — I480 Paroxysmal atrial fibrillation: Secondary | ICD-10-CM | POA: Diagnosis not present

## 2022-11-10 NOTE — Patient Instructions (Addendum)
Medication Instructions:   TAKE 40 MG OF TORSEMIDE IN THE MORNING AND 20 MG OF TORSEMIDE 6 HOURS LATER FOR THE NEXT 5 DAYS THEN REDUCE BACK TO 40 MG ONCE DAILY IN THE MORNING  *If you need a refill on your cardiac medications before your next appointment, please call your pharmacy*   Lab Work:  Your physician recommends that you return for lab work in: 7 DAYS=11/17/22  If you have labs (blood work) drawn today and your tests are completely normal, you will receive your results only by: MyChart Message (if you have MyChart) OR A paper copy in the mail If you have any lab test that is abnormal or we need to change your treatment, we will call you to review the results.    Follow-Up: At [facility], you and your health needs are our priority.  As part of our continuing mission to provide you with exceptional heart care, we have created designated Provider Care Teams.  These Care Teams include your primary Cardiologist (physician) and Advanced Practice Providers (APPs -  Physician Assistants and Nurse Practitioners) who all work together to provide you with the care you need, when you need it.  We recommend signing up for the patient portal called "MyChart".  Sign up information is provided on this After Visit Summary.  MyChart is used to connect with patients for Virtual Visits (Telemedicine).  Patients are able to view lab/test results, encounter notes, upcoming appointments, etc.  Non-urgent messages can be sent to your provider as well.   To learn more about what you can do with MyChart, go to [email or web address].    Your next appointment:    AS SCHEDULED

## 2022-11-11 ENCOUNTER — Ambulatory Visit (INDEPENDENT_AMBULATORY_CARE_PROVIDER_SITE_OTHER): Payer: Medicare Other

## 2022-11-11 VITALS — Ht 66.0 in | Wt 148.0 lb

## 2022-11-11 DIAGNOSIS — Z0000 Encounter for general adult medical examination without abnormal findings: Secondary | ICD-10-CM | POA: Diagnosis not present

## 2022-11-11 LAB — BASIC METABOLIC PANEL
BUN/Creatinine Ratio: 20 (ref 10–24)
BUN: 34 mg/dL — ABNORMAL HIGH (ref 8–27)
CO2: 25 mmol/L (ref 20–29)
Calcium: 9.8 mg/dL (ref 8.6–10.2)
Chloride: 99 mmol/L (ref 96–106)
Creatinine, Ser: 1.66 mg/dL — ABNORMAL HIGH (ref 0.76–1.27)
Glucose: 77 mg/dL (ref 70–99)
Potassium: 3.9 mmol/L (ref 3.5–5.2)
Sodium: 141 mmol/L (ref 134–144)
eGFR: 42 mL/min/{1.73_m2} — ABNORMAL LOW (ref >59)

## 2022-11-11 NOTE — Progress Notes (Signed)
Subjective:   [name] is a 82 y.o. male who presents for Medicare Annual/Subsequent preventive examination. I connected with  [name] on 11/11/22 by a audio enabled telemedicine application and verified that I am speaking with the correct person using two identifiers.  Patient Location: Home  Provider Location: Home Office  I discussed the limitations of evaluation and management by telemedicine. The patient expressed understanding and agreed to proceed.  Review of Systems     Cardiac Risk Factors include: advanced age (>36men, >57 women);hypertension;dyslipidemia;male gender;smoking/ tobacco exposure     Objective:    Today's Vitals   11/11/22 0842  Weight: 148 lb (67.1 kg)  Height: 5\' 6"  (1.676 m)   Body mass index is 23.89 kg/m.     11/11/2022    8:46 AM 04/21/2021    4:15 PM 04/18/2019    8:32 AM 04/26/2018    9:50 AM 01/14/2016    7:46 AM 12/10/2015    7:03 AM 12/08/2015    2:04 PM  Advanced Directives  Does Patient Have a Medical Advance Directive? Yes No Yes No No No No  Type of Estate agent of [place];Living will  Healthcare Power of [place];Living will      Does patient want to make changes to medical advance directive?   No - Patient declined      Copy of Healthcare Power of Attorney in Chart? No - copy requested  No - copy requested      Would patient like information on creating a medical advance directive?  No - Patient declined   Yes - Educational materials given No - patient declined information No - patient declined information    Current Medications (verified) Outpatient Encounter Medications as of 11/11/2022  Medication Sig   albuterol (VENTOLIN HFA) 108 (90 Base) MCG/ACT inhaler INHALE 1 PUFF BY MOUTH EVERY 6 HOURS AS NEEDED FOR SHORTNESS OF BREATH   ALPRAZolam (XANAX) 1 MG tablet TAKE 1 TABLET BY MOUTH TWICE DAILY . APPOINTMENT REQUIRED FOR FUTURE REFILLS   apixaban (ELIQUIS) 5 MG TABS tablet Take 1 tablet (5 mg  total) by mouth 2 (two) times daily.   cetirizine (ZYRTEC) 10 MG tablet Take 10 mg by mouth daily as needed for allergies.   lisinopril (ZESTRIL) 40 MG tablet Take 1 tablet (40 mg total) by mouth daily.   metoprolol tartrate (LOPRESSOR) 100 MG tablet Take 1 tablet (100 mg total) by mouth 2 (two) times daily.   Multiple Vitamin (MULTIVITAMIN) capsule Take 1 capsule by mouth daily.   pantoprazole (PROTONIX) 40 MG tablet Take 1 tablet (40 mg total) by mouth daily.   pravastatin (PRAVACHOL) 80 MG tablet Take 1 tablet by mouth in the evening   pregabalin (LYRICA) 50 MG capsule Take 1 capsule (50 mg total) by mouth 2 (two) times daily.   tamsulosin (FLOMAX) 0.4 MG CAPS capsule Take 1 capsule (0.4 mg total) by mouth daily.   torsemide (DEMADEX) 20 MG tablet Take 2 tablets (40 mg total) by mouth daily. (Patient taking differently: Take 20 mg by mouth daily.)   No facility-administered encounter medications on file as of 11/11/2022.    Allergies (verified) Gabapentin, Sudafed [pseudoephedrine hcl], Prednisone, and Avelox [moxifloxacin hcl in nacl]   History: Past Medical History:  Diagnosis Date   Anxiety    Arthritis    CAD (coronary artery disease)    Nonobstructive cath 2012.  PCI to RCA 2003   Carotid artery disease (HCC)    Cataract  CHF (congestive heart failure) (HCC)    COPD (chronic obstructive pulmonary disease) (HCC)    Depression    Dyslipidemia    EtOH dependence (HCC)    GERD (gastroesophageal reflux disease)    Hyperlipidemia    Hypertension    Insomnia    Nonischemic cardiomyopathy (HCC)    EF was 20% (60% last echo 2015)   Pacemaker    and defibrillator   PAF (paroxysmal atrial fibrillation) (HCC)    Peptic ulcer disease    Tobacco abuse    Past Surgical History:  Procedure Laterality Date   BIV ICD GENERTAOR CHANGE OUT N/A 03/06/2014   [employer] Gen change by Dr Johney Frame Lianne Moris CRT-D with LV1 header)   biventricular defibrillator implantation      [employer] and pacemaker   CATARACT EXTRACTION W/PHACO Right 12/10/2015   Procedure: CATARACT EXTRACTION PHACO AND INTRAOCULAR LENS PLACEMENT RIGHT EYE; CDE: 19.30;  Surgeon: Gemma Payor, MD;  Location: [facility];  Service: Ophthalmology;  Laterality: Right;   CATARACT EXTRACTION W/PHACO Left 01/14/2016   Procedure: CATARACT EXTRACTION PHACO AND INTRAOCULAR LENS PLACEMENT LEFT EYE; CDE:  8.03;  Surgeon: Gemma Payor, MD;  Location: [facility];  Service: Ophthalmology;  Laterality: Left;   COLONOSCOPY     EYE SURGERY     Ulcer surgery     repair of stomach ulcer   Family History  Problem Relation Age of Onset   Heart failure Father    Colon polyps Sister    GI Bleed Sister    GI Bleed Brother    Heart attack Brother    Psoriasis Daughter    Arthritis/Rheumatoid Daughter    Cancer Brother    Heart disease Brother    Heart disease Brother    Colon cancer Neg Hx    Esophageal cancer Neg Hx    Rectal cancer Neg Hx    Stomach cancer Neg Hx    Social History   Socioeconomic History   Marital status: Divorced    Spouse name: Not on file   Number of children: 4   Years of education: Some college   Highest education level: Some college, no degree  Occupational History   Occupation: Retired    Associate Professor: [employer]  Tobacco Use   Smoking status: Some Days    Packs/day: 0.25    Years: 45.00    Additional pack years: 0.00    Total pack years: 11.25    Types: Cigarettes   Smokeless tobacco: Former    Types: Chew    Quit date: 06/20/1989   Tobacco comments:    he is not interested in cessation    Patient started back smoking about 3 Cigarettes a daily  Vaping Use   Vaping Use: Never used  Substance and Sexual Activity   Alcohol use: Yes    Alcohol/week: 6.0 standard drinks of alcohol    Types: 6 Cans of beer per week    Comment: drings 4-6 beers per day   Drug use: No    Types: Marijuana    Comment: last used marijuana yesterday   Sexual activity: Not Currently    Birth  control/protection: None  Other Topics Concern   Not on file  Social History Narrative   Divorced 2 sons 2 daughters   Is retired from Administrator, sports, he was a Psychologist, occupational   3 alcoholic beverages daily no caffeine no drugs he still smokes no other tobacco or drug use   Social Determinants of Dispensing optician  Resource Strain: Low Risk  (11/11/2022)   Overall Financial Resource Strain (CARDIA)    Difficulty of Paying Living Expenses: Not hard at all  Food Insecurity: No Food Insecurity (11/11/2022)   Hunger Vital Sign    Worried About Running Out of Food in the Last Year: Never true    Ran Out of Food in the Last Year: Never true  Transportation Needs: No Transportation Needs (11/11/2022)   PRAPARE - Administrator, Civil Service (Medical): No    Lack of Transportation (Non-Medical): No  Physical Activity: Insufficiently Active (11/11/2022)   Exercise Vital Sign    Days of Exercise per Week: 3 days    Minutes of Exercise per Session: 30 min  Stress: No Stress Concern Present (11/11/2022)   [employer] of Occupational Health - Occupational Stress Questionnaire    Feeling of Stress : Not at all  Social Connections: Socially Isolated (11/11/2022)   Social Connection and Isolation Panel [NHANES]    Frequency of Communication with Friends and Family: More than three times a week    Frequency of Social Gatherings with Friends and Family: More than three times a week    Attends Religious Services: Never    Database administrator or Organizations: No    Attends Engineer, structural: Never    Marital Status: Divorced    Tobacco Counseling Ready to quit: No Counseling given: Not Answered Tobacco comments: he is not interested in cessation Patient started back smoking about 3 Cigarettes a daily   Clinical Intake:  Pre-visit preparation completed: Yes  Pain : No/denies pain     Nutritional Risks: None Diabetes: No  How often do you need to have  someone help you when you read instructions, pamphlets, or other written materials from your doctor or pharmacy?: 1 - Never  Diabetic?no   Interpreter Needed?: No  Information entered by :: Renie Ora, LPN   Activities of Daily Living    11/11/2022    8:46 AM  In your present state of health, do you have any difficulty performing the following activities:  Hearing? 0  Vision? 0  Difficulty concentrating or making decisions? 0  Walking or climbing stairs? 0  Dressing or bathing? 0  Doing errands, shopping? 0  Preparing Food and eating ? N  Using the Toilet? N  In the past six months, have you accidently leaked urine? N  Do you have problems with loss of bowel control? N  Managing your Medications? N  Managing your Finances? N  Housekeeping or managing your Housekeeping? N    Patient Care Team: Junie Spencer, FNP as PCP - General (Nurse Practitioner) Hillis Range, MD (Inactive) as PCP - Electrophysiology (Cardiology) Rollene Rotunda, MD as PCP - Cardiology (Cardiology)  Indicate any recent Medical Services you may have received from other than [facility] providers in the past year (date may be approximate).     Assessment:   This is a routine wellness examination for [employer].  Hearing/Vision screen Vision Screening - Comments:: Wears rx glasses - up to date with routine eye exams with  Dr.Johnson   Dietary issues and exercise activities discussed: Current Exercise Habits: Home exercise routine, Type of exercise: walking, Time (Minutes): 30, Frequency (Times/Week): 3, Weekly Exercise (Minutes/Week): 90, Intensity: Mild, Exercise limited by: neurologic condition(s)   Goals Addressed             This Visit's Progress    DIET - INCREASE WATER INTAKE  Depression Screen    11/11/2022    8:45 AM 09/20/2022    9:26 AM 08/08/2022    2:35 PM 07/18/2022   11:46 AM 05/24/2022   11:15 AM 05/03/2022    9:17 AM 01/03/2022   11:37 AM  PHQ 2/9 Scores  PHQ - 2 Score 0  0  0 0 0 2  PHQ- 9 Score   0 0 0  4  Exception Documentation  Patient refusal    Patient refusal     Fall Risk    11/11/2022    8:44 AM 09/20/2022    9:26 AM 08/08/2022    2:35 PM 07/18/2022   11:46 AM 05/24/2022   11:15 AM  Fall Risk   Falls in the past year? 0 1 0 0 0  Number falls in past yr: 0 0  0   Injury with Fall? 0 0  0   Risk for fall due to : No Fall Risks Impaired balance/gait  No Fall Risks   Follow up Falls prevention discussed Education provided;Falls evaluation completed  Falls evaluation completed     FALL RISK PREVENTION PERTAINING TO THE HOME:  Any stairs in or around the home? Yes  If so, are there any without handrails? No  Home free of loose throw rugs in walkways, pet beds, electrical cords, etc? Yes  Adequate lighting in your home to reduce risk of falls? Yes   ASSISTIVE DEVICES UTILIZED TO PREVENT FALLS:  Life alert? No  Use of a cane, walker or w/c? Yes  Grab bars in the bathroom? Yes  Shower chair or bench in shower? Yes  Elevated toilet seat or a handicapped toilet? Yes       10/16/2017   11:06 AM  MMSE - Mini Mental State Exam  Orientation to time 3  Orientation to Place 4  Registration 3  Attention/ Calculation 2  Recall 1  Language- name 2 objects 2  Language- repeat 1  Language- follow 3 step command 3  Language- read & follow direction 1  Write a sentence 1  Copy design 1  Total score 22        11/11/2022    8:46 AM 04/21/2021    4:37 PM 04/21/2021    4:18 PM 04/18/2019    8:36 AM  6CIT Screen  What Year? 0 points 0 points 0 points 0 points  What month? 0 points 0 points 0 points 0 points  What time? 0 points 0 points 0 points 0 points  Count back from 20 0 points 0 points 0 points 0 points  Months in reverse 0 points 0 points 0 points 0 points  Repeat phrase 0 points 2 points 2 points 4 points  Total Score 0 points 2 points 2 points 4 points    Immunizations Immunization History  Administered Date(s) Administered   Fluad  Quad(high Dose 65+) 04/17/2019, 05/03/2022   Influenza Whole 05/13/2008   Influenza, High Dose Seasonal PF 04/24/2014, 04/09/2015, 03/28/2017, 04/24/2018, 02/22/2019, 02/25/2019   Influenza,inj,Quad PF,6+ Mos 03/15/2016   Influenza-Unspecified 03/20/2013, 04/22/2020   Moderna Sars-Covid-2 Vaccination 07/25/2019, 08/23/2019, 06/04/2020   Pneumococcal Conjugate-13 09/24/2015   Pneumococcal Polysaccharide-23 03/28/2017   Tdap 10/31/2017   Zoster Recombinat (Shingrix) 08/12/2021, 07/18/2022    TDAP status: Up to date  Flu Vaccine status: Up to date  Pneumococcal vaccine status: Up to date  Covid-19 vaccine status: Completed vaccines  Qualifies for Shingles Vaccine? Yes   Zostavax completed Yes   Shingrix Completed?: Yes  Screening Tests  Health Maintenance  Topic Date Due   COVID-19 Vaccine (4 - 2023-24 season) 02/18/2022   INFLUENZA VACCINE  01/19/2023   Medicare Annual Wellness (AWV)  11/11/2023   DTaP/Tdap/Td (2 - Td or Tdap) 11/01/2027   Pneumonia Vaccine 14+ Years old  Completed   Hepatitis C Screening  Completed   Zoster Vaccines- Shingrix  Completed   HPV VACCINES  Aged Out   Colonoscopy  Discontinued    Health Maintenance  Health Maintenance Due  Topic Date Due   COVID-19 Vaccine (4 - 2023-24 season) 02/18/2022    Colorectal cancer screening: No longer required.   Lung Cancer Screening: (Low Dose CT Chest recommended if Age 63-80 years, 30 pack-year currently smoking OR have quit w/in 15years.) does qualify.   Lung Cancer Screening Referral: declined   Additional Screening:  Hepatitis C Screening: does not qualify;   Vision Screening: Recommended annual ophthalmology exams for early detection of glaucoma and other disorders of the eye. Is the patient up to date with their annual eye exam?  Yes  Who is the provider or what is the name of the office in which the patient attends annual eye exams? Dr.Johnson  If pt is not established with a provider, would  they like to be referred to a provider to establish care? No .   Dental Screening: Recommended annual dental exams for proper oral hygiene  Community Resource Referral / Chronic Care Management: CRR required this visit?  No   CCM required this visit?  No      Plan:     I have personally reviewed and noted the following in the patient's chart:   Medical and social history Use of alcohol, tobacco or illicit drugs  Current medications and supplements including opioid prescriptions. Patient is not currently taking opioid prescriptions. Functional ability and status Nutritional status Physical activity Advanced directives List of other physicians Hospitalizations, surgeries, and ER visits in previous 12 months Vitals Screenings to include cognitive, depression, and falls Referrals and appointments  In addition, I have reviewed and discussed with patient certain preventive protocols, quality metrics, and best practice recommendations. A written personalized care plan for preventive services as well as general preventive health recommendations were provided to patient.     Lorrene Reid, LPN   02/16/5620   Nurse Notes: Bonita Quin patients daughter assisted with MWV

## 2022-11-11 NOTE — Patient Instructions (Signed)
[name] , Thank you for taking time to come for your Medicare Wellness Visit. I appreciate your ongoing commitment to your health goals. Please review the following plan we discussed and let me know if I can assist you in the future.   These are the goals we discussed:  Goals      DIET - INCREASE WATER INTAKE     Exercise 150 min/wk Moderate Activity     Quit Smoking        This is a list of the screening recommended for you and due dates:  Health Maintenance  Topic Date Due   COVID-19 Vaccine (4 - 2023-24 season) 02/18/2022   Flu Shot  01/19/2023   Medicare Annual Wellness Visit  11/11/2023   DTaP/Tdap/Td vaccine (2 - Td or Tdap) 11/01/2027   Pneumonia Vaccine  Completed   Hepatitis C Screening  Completed   Zoster (Shingles) Vaccine  Completed   HPV Vaccine  Aged Out   Colon Cancer Screening  Discontinued    Advanced directives: Advance directive discussed with you today. I have provided a copy for you to complete at home and have notarized. Once this is complete please bring a copy in to our office so we can scan it into your chart.   Conditions/risks identified: Aim for 30 minutes of exercise or brisk walking, 6-8 glasses of water, and 5 servings of fruits and vegetables each day.   Next appointment: Follow up in one year for your annual wellness visit    Preventive Care 65 Years and Older, Male Preventive care refers to lifestyle choices and visits with your health care provider that can promote health and wellness. What does preventive care include? A yearly physical exam. This is also called an annual well check. Dental exams once or twice a year. Routine eye exams. Ask your health care provider how often you should have your eyes checked. Personal lifestyle choices, including: Daily care of your teeth and gums. Regular physical activity. Eating a healthy diet. Avoiding tobacco and drug use. Limiting alcohol use. Practicing safe sex. Taking low-dose aspirin  every day. Taking vitamin and mineral supplements as recommended by your health care provider. What happens during an annual well check? The services and screenings done by your health care provider during your annual well check will depend on your age, overall health, lifestyle risk factors, and family history of disease. Counseling  Your health care provider may ask you questions about your: Alcohol use. Tobacco use. Drug use. Emotional well-being. Home and relationship well-being. Sexual activity. Eating habits. History of falls. Memory and ability to understand (cognition). Work and work Astronomer. Reproductive health. Screening  You may have the following tests or measurements: Height, weight, and BMI. Blood pressure. Lipid and cholesterol levels. These may be checked every 5 years, or more frequently if you are over 27 years old. Skin check. Lung cancer screening. You may have this screening every year starting at age 107 if you have a 30-pack-year history of smoking and currently smoke or have quit within the past 15 years. Fecal occult blood test (FOBT) of the stool. You may have this test every year starting at age 69. Flexible sigmoidoscopy or colonoscopy. You may have a sigmoidoscopy every 5 years or a colonoscopy every 10 years starting at age 12. Hepatitis C blood test. Hepatitis B blood test. Sexually transmitted disease (STD) testing. Diabetes screening. This is done by checking your blood sugar (glucose) after you have not eaten for a while (fasting). You  may have this done every 1-3 years. Bone density scan. This is done to screen for osteoporosis. You may have this done starting at age 50. Mammogram. This may be done every 1-2 years. Talk to your health care provider about how often you should have regular mammograms. Talk with your health care provider about your test results, treatment options, and if necessary, the need for more tests. Vaccines  Your health  care provider may recommend certain vaccines, such as: Influenza vaccine. This is recommended every year. Tetanus, diphtheria, and acellular pertussis (Tdap, Td) vaccine. You may need a Td booster every 10 years. Zoster vaccine. You may need this after age 79. Pneumococcal 13-valent conjugate (PCV13) vaccine. One dose is recommended after age 38. Pneumococcal polysaccharide (PPSV23) vaccine. One dose is recommended after age 44. Talk to your health care provider about which screenings and vaccines you need and how often you need them. This information is not intended to replace advice given to you by your health care provider. Make sure you discuss any questions you have with your health care provider. Document Released: 07/03/2015 Document Revised: 02/24/2016 Document Reviewed: 04/07/2015 Elsevier Interactive Patient Education  2017 [employer].  Fall Prevention in the Home Falls can cause injuries. They can happen to people of all ages. There are many things you can do to make your home safe and to help prevent falls. What can I do on the outside of my home? Regularly fix the edges of walkways and driveways and fix any cracks. Remove anything that might make you trip as you walk through a door, such as a raised step or threshold. Trim any bushes or trees on the path to your home. Use bright outdoor lighting. Clear any walking paths of anything that might make someone trip, such as rocks or tools. Regularly check to see if handrails are loose or broken. Make sure that both sides of any steps have handrails. Any raised decks and porches should have guardrails on the edges. Have any leaves, snow, or ice cleared regularly. Use sand or salt on walking paths during winter. Clean up any spills in your garage right away. This includes oil or grease spills. What can I do in the bathroom? Use night lights. Install grab bars by the toilet and in the tub and shower. Do not use towel bars as grab  bars. Use non-skid mats or decals in the tub or shower. If you need to sit down in the shower, use a plastic, non-slip stool. Keep the floor dry. Clean up any water that spills on the floor as soon as it happens. Remove soap buildup in the tub or shower regularly. Attach bath mats securely with double-sided non-slip rug tape. Do not have throw rugs and other things on the floor that can make you trip. What can I do in the bedroom? Use night lights. Make sure that you have a light by your bed that is easy to reach. Do not use any sheets or blankets that are too big for your bed. They should not hang down onto the floor. Have a firm chair that has side arms. You can use this for support while you get dressed. Do not have throw rugs and other things on the floor that can make you trip. What can I do in the kitchen? Clean up any spills right away. Avoid walking on wet floors. Keep items that you use a lot in easy-to-reach places. If you need to reach something above you, use a strong  step stool that has a grab bar. Keep electrical cords out of the way. Do not use floor polish or wax that makes floors slippery. If you must use wax, use non-skid floor wax. Do not have throw rugs and other things on the floor that can make you trip. What can I do with my stairs? Do not leave any items on the stairs. Make sure that there are handrails on both sides of the stairs and use them. Fix handrails that are broken or loose. Make sure that handrails are as long as the stairways. Check any carpeting to make sure that it is firmly attached to the stairs. Fix any carpet that is loose or worn. Avoid having throw rugs at the top or bottom of the stairs. If you do have throw rugs, attach them to the floor with carpet tape. Make sure that you have a light switch at the top of the stairs and the bottom of the stairs. If you do not have them, ask someone to add them for you. What else can I do to help prevent  falls? Wear shoes that: Do not have high heels. Have rubber bottoms. Are comfortable and fit you well. Are closed at the toe. Do not wear sandals. If you use a stepladder: Make sure that it is fully opened. Do not climb a closed stepladder. Make sure that both sides of the stepladder are locked into place. Ask someone to hold it for you, if possible. Clearly mark and make sure that you can see: Any grab bars or handrails. First and last steps. Where the edge of each step is. Use tools that help you move around (mobility aids) if they are needed. These include: Canes. Walkers. Scooters. Crutches. Turn on the lights when you go into a dark area. Replace any light bulbs as soon as they burn out. Set up your furniture so you have a clear path. Avoid moving your furniture around. If any of your floors are uneven, fix them. If there are any pets around you, be aware of where they are. Review your medicines with your doctor. Some medicines can make you feel dizzy. This can increase your chance of falling. Ask your doctor what other things that you can do to help prevent falls. This information is not intended to replace advice given to you by your health care provider. Make sure you discuss any questions you have with your health care provider. Document Released: 04/02/2009 Document Revised: 11/12/2015 Document Reviewed: 07/11/2014 Elsevier Interactive Patient Education  2017 [employer].

## 2022-11-16 NOTE — Progress Notes (Signed)
Remote ICD transmission.   

## 2022-11-18 ENCOUNTER — Other Ambulatory Visit: Payer: Medicare Other

## 2022-11-18 DIAGNOSIS — I5033 Acute on chronic diastolic (congestive) heart failure: Secondary | ICD-10-CM | POA: Diagnosis not present

## 2022-11-18 DIAGNOSIS — I5032 Chronic diastolic (congestive) heart failure: Secondary | ICD-10-CM | POA: Diagnosis not present

## 2022-11-18 DIAGNOSIS — R140 Abdominal distension (gaseous): Secondary | ICD-10-CM | POA: Diagnosis not present

## 2022-11-18 DIAGNOSIS — Z515 Encounter for palliative care: Secondary | ICD-10-CM | POA: Diagnosis not present

## 2022-11-18 DIAGNOSIS — K5900 Constipation, unspecified: Secondary | ICD-10-CM | POA: Diagnosis not present

## 2022-11-18 LAB — BASIC METABOLIC PANEL
BUN/Creatinine Ratio: 19 (ref 10–24)
BUN: 32 mg/dL — ABNORMAL HIGH (ref 8–27)
CO2: 25 mmol/L (ref 20–29)
Calcium: 9.5 mg/dL (ref 8.6–10.2)
Chloride: 102 mmol/L (ref 96–106)
Creatinine, Ser: 1.71 mg/dL — ABNORMAL HIGH (ref 0.76–1.27)
Glucose: 144 mg/dL — ABNORMAL HIGH (ref 70–99)
Potassium: 4 mmol/L (ref 3.5–5.2)
Sodium: 141 mmol/L (ref 134–144)
eGFR: 40 mL/min/{1.73_m2} — ABNORMAL LOW (ref >59)

## 2022-11-20 ENCOUNTER — Other Ambulatory Visit: Payer: Self-pay | Admitting: Family

## 2022-11-20 DIAGNOSIS — N401 Enlarged prostate with lower urinary tract symptoms: Secondary | ICD-10-CM

## 2022-11-20 NOTE — Progress Notes (Unsigned)
Cardiology Office Note:   Date:  11/23/2022  ID:  [name], [name] March 22, 1941, MRN [id number] PCP: Junie Spencer, FNP  [facility] Providers Cardiologist:  Rollene Rotunda, MD Electrophysiologist:  Hillis Range, MD (Inactive)      History of Present Illness:   [name] is a 82 y.o. male who presents for follow up of non ischemic cardiomyopathy and atrial fib.  I saw him recently with increased leg edema.  He had a stroke Dec 2023.  He had resolution of hemiparesis.  He was hospitalized at [facility] with volume overload.  He had an EF of 50 - 55% on echo.    There was moderate TR.  He was managed for acute on chronic diastolic HF.   He had AKI.    At the last visit he had low BPs and increased edema.  I held his Norvasc and changed Lasix to Torsemide.  I saw him recently and increased his diuretic because he was having increased swelling and SOB.  Follow up creat was stable. He returns for follow up.    He has lost about 10 lbs by taking twice the dose of torsemide for about 5 days.  He is now back to his regular dose.    The patient denies any new symptoms such as chest discomfort, neck or arm discomfort. There has been no new shortness of breath, PND or orthopnea. There have been no reported palpitations, presyncope or syncope.   ROS: Lip cancer resected and nonhealing  Studies Reviewed:    EKG:  NA    Risk Assessment/Calculations:    CHA2DS2-VASc Score = 4   This indicates a 4.8% annual risk of stroke. The patient's score is based upon:         Physical Exam:   VS:  BP (!) 150/82   Pulse 72   Ht 5\' 4"  (1.626 m)   Wt 138 lb (62.6 kg)   BMI 23.69 kg/m    Wt Readings from Last 3 Encounters:  11/23/22 138 lb (62.6 kg)  11/11/22 148 lb (67.1 kg)  11/10/22 147 lb (66.7 kg)     GEN: Well nourished, well developed in no acute distress NECK: No JVD; No carotid bruits CARDIAC: RRR, no murmurs, rubs, gallops RESPIRATORY:  Clear to auscultation without  rales, wheezing or rhonchi  ABDOMEN: Soft, non-tender, non-distended EXTREMITIES:  No edema; No deformity   ASSESSMENT AND PLAN:   PAF:    Mr. ADOM CABADAS has a CHA2DS2 - VASc score of 4.   He has paroxysmal atrial fibrillation but seems to tolerate this.  He tolerates anticoagulation.  ACUTE ON CHRONIC SYSTOLIC AND DIASTOLIC HF:    He is responding well to diuresis.  No change in therapy.  Action fraction 50%.  No change in therapy.  Of note his blood pressure actually is running low in the past so this is unusual.  His daughter will keep an eye on this and we can then titrate if his blood pressure actually starts to be elevated.   VENTRICULAR TACHYCARDIA:    He is up-to-date with device follow-up.  No change in therapy.  TOBACCO ABUSE: I again told him about the need to stop smoking.  No change in therapy.  DYSLIPIDEMIA:   LDL was 68.  No change in therapy.   HTN: His blood pressure is at target.  At target at home.Marland Kitchen  No change in therapy.   SLEEP APNEA:   He is using BiPAP  He uses a BiPAP.        Signed, Rollene Rotunda, MD

## 2022-11-22 ENCOUNTER — Other Ambulatory Visit: Payer: Self-pay | Admitting: Family

## 2022-11-22 MED ORDER — LINACLOTIDE 72 MCG PO CAPS: 72.0000 ug | ORAL_CAPSULE | Freq: Every day | ORAL | 1 refills | Status: DC

## 2022-11-23 ENCOUNTER — Encounter: Payer: Self-pay | Admitting: Cardiology

## 2022-11-23 ENCOUNTER — Ambulatory Visit (INDEPENDENT_AMBULATORY_CARE_PROVIDER_SITE_OTHER): Payer: Medicare Other | Admitting: Cardiology

## 2022-11-23 VITALS — BP 150/82 | HR 72 | Ht 64.0 in | Wt 138.0 lb

## 2022-11-23 DIAGNOSIS — I5043 Acute on chronic combined systolic (congestive) and diastolic (congestive) heart failure: Secondary | ICD-10-CM | POA: Diagnosis not present

## 2022-11-23 NOTE — Patient Instructions (Signed)
Medication Instructions:  The current medical regimen is effective;  continue present plan and medications.  *If you need a refill on your cardiac medications before your next appointment, please call your pharmacy   Follow-Up: At  [facility], you and your health needs are our priority.  As part of our continuing mission to provide you with exceptional heart care, we have created designated Provider Care Teams.  These Care Teams include your primary Cardiologist (physician) and Advanced Practice Providers (APPs -  Physician Assistants and Nurse Practitioners) who all work together to provide you with the care you need, when you need it.  We recommend signing up for the patient portal called "MyChart".  Sign up information is provided on this After Visit Summary.  MyChart is used to connect with patients for Virtual Visits (Telemedicine).  Patients are able to view lab/test results, encounter notes, upcoming appointments, etc.  Non-urgent messages can be sent to your provider as well.   To learn more about what you can do with MyChart, go to [email or web address].    Your next appointment:   3 month(s)  Provider:   James Hochrein, MD     

## 2022-12-01 DIAGNOSIS — C001 Malignant neoplasm of external lower lip: Secondary | ICD-10-CM | POA: Diagnosis not present

## 2022-12-09 DIAGNOSIS — Z4801 Encounter for change or removal of surgical wound dressing: Secondary | ICD-10-CM | POA: Diagnosis not present

## 2022-12-09 DIAGNOSIS — Z48817 Encounter for surgical aftercare following surgery on the skin and subcutaneous tissue: Secondary | ICD-10-CM | POA: Diagnosis not present

## 2022-12-09 DIAGNOSIS — R030 Elevated blood-pressure reading, without diagnosis of hypertension: Secondary | ICD-10-CM | POA: Diagnosis not present

## 2022-12-09 DIAGNOSIS — S01501A Unspecified open wound of lip, initial encounter: Secondary | ICD-10-CM | POA: Diagnosis not present

## 2022-12-09 DIAGNOSIS — Z6824 Body mass index (BMI) 24.0-24.9, adult: Secondary | ICD-10-CM | POA: Diagnosis not present

## 2022-12-20 ENCOUNTER — Ambulatory Visit: Payer: Medicare Other | Admitting: Family

## 2022-12-20 VITALS — BP 149/74 | HR 69 | Temp 96.6°F | Ht 64.0 in | Wt 142.0 lb

## 2022-12-20 DIAGNOSIS — R3914 Feeling of incomplete bladder emptying: Secondary | ICD-10-CM | POA: Diagnosis not present

## 2022-12-20 DIAGNOSIS — F1320 Sedative, hypnotic or anxiolytic dependence, uncomplicated: Secondary | ICD-10-CM

## 2022-12-20 DIAGNOSIS — G4700 Insomnia, unspecified: Secondary | ICD-10-CM

## 2022-12-20 DIAGNOSIS — I10 Essential (primary) hypertension: Secondary | ICD-10-CM | POA: Diagnosis not present

## 2022-12-20 DIAGNOSIS — I700 Atherosclerosis of aorta: Secondary | ICD-10-CM | POA: Diagnosis not present

## 2022-12-20 DIAGNOSIS — E785 Hyperlipidemia, unspecified: Secondary | ICD-10-CM

## 2022-12-20 DIAGNOSIS — Z79899 Other long term (current) drug therapy: Secondary | ICD-10-CM

## 2022-12-20 DIAGNOSIS — F411 Generalized anxiety disorder: Secondary | ICD-10-CM

## 2022-12-20 DIAGNOSIS — I480 Paroxysmal atrial fibrillation: Secondary | ICD-10-CM

## 2022-12-20 DIAGNOSIS — M545 Low back pain, unspecified: Secondary | ICD-10-CM

## 2022-12-20 DIAGNOSIS — K219 Gastro-esophageal reflux disease without esophagitis: Secondary | ICD-10-CM

## 2022-12-20 DIAGNOSIS — G621 Alcoholic polyneuropathy: Secondary | ICD-10-CM

## 2022-12-20 DIAGNOSIS — N401 Enlarged prostate with lower urinary tract symptoms: Secondary | ICD-10-CM

## 2022-12-20 MED ORDER — ALPRAZOLAM 1 MG PO TABS: ORAL_TABLET | ORAL | 2 refills | Status: DC

## 2022-12-20 MED ORDER — TAMSULOSIN HCL 0.4 MG PO CAPS: 0.4000 mg | ORAL_CAPSULE | Freq: Every day | ORAL | 0 refills | Status: DC

## 2022-12-20 MED ORDER — PANTOPRAZOLE SODIUM 40 MG PO TBEC: 40.0000 mg | DELAYED_RELEASE_TABLET | Freq: Every day | ORAL | 1 refills | Status: DC

## 2022-12-20 MED ORDER — LISINOPRIL 40 MG PO TABS
40.0000 mg | ORAL_TABLET | Freq: Every day | ORAL | 1 refills | Status: DC
Start: 2022-12-20 — End: 2023-01-25

## 2022-12-20 MED ORDER — ALBUTEROL SULFATE HFA 108 (90 BASE) MCG/ACT IN AERS: INHALATION_SPRAY | RESPIRATORY_TRACT | 2 refills | Status: DC

## 2022-12-20 MED ORDER — LINACLOTIDE 72 MCG PO CAPS: 72.0000 ug | ORAL_CAPSULE | Freq: Every day | ORAL | 1 refills | Status: DC

## 2022-12-20 MED ORDER — PREGABALIN 50 MG PO CAPS
50.0000 mg | ORAL_CAPSULE | Freq: Two times a day (BID) | ORAL | 2 refills | Status: DC
Start: 2022-12-20 — End: 2023-02-10

## 2022-12-20 MED ORDER — PRAVASTATIN SODIUM 80 MG PO TABS: 80.0000 mg | ORAL_TABLET | Freq: Every evening | ORAL | 3 refills | Status: DC

## 2022-12-20 NOTE — Progress Notes (Signed)
Subjective:    Patient ID: [name], male    DOB: Dec 12, 1940, 82 y.o.   MRN: [id number]  Chief Complaint  Patient presents with   Medical Management of Chronic Issues   Foot Pain    On out side of feet wants to see if maybe a higher dose of lyrica will help   Pt presents to the office today for  chronic follow up. PT is followed by Cardiologists for CAD, CHF, Atherosclerosis, Cardiomyopathy, and Ventricular tachycardia every 2 years.  PT has pacemaker and is followed annually for this.     Pt is followed by Dermatologists for psoriasis as needed.     Has OSA and uses CPAP nightly.    He  reports he quit drinking 05/13/22. He reports constant burning and aching pain of bilateral feet from alocholic peripheral neuropathy that is 7 out 10. The Lyrica 50 mg BID has greatly helped.    He has aortic atheroscleroses and takes pravastatin daily.   He had a CVA in 05/13/22, with mild left sided weakness. He is doing PT. This has greatly improved.    He has emphysema. Smoking less 1/2 a pack a day. Denies any SOB.   He has taken his demadex 40 mg daily. This week he has been taking 40 mg one day and 20 mg the other.     12/20/2022    9:05 AM 11/23/2022    2:35 PM 11/11/2022    8:42 AM  Last 3 Weights  Weight (lbs) 142 lb 138 lb 148 lb  Weight (kg) 64.411 kg 62.596 kg 67.132 kg     Hypertension This is a chronic problem. The current episode started more than 1 year ago. The problem has been waxing and waning since onset. Associated symptoms include anxiety and malaise/fatigue. Pertinent negatives include no peripheral edema or shortness of breath. Risk factors for coronary artery disease include dyslipidemia, male gender, sedentary lifestyle and smoking/tobacco exposure. The current treatment provides moderate improvement.  Congestive Heart Failure Presents for follow-up visit. Associated symptoms include edema and nocturia (1). Pertinent negatives include no shortness of breath. The  symptoms have been stable.  Gastroesophageal Reflux He complains of belching and heartburn. This is a chronic problem. The current episode started more than 1 year ago. The problem occurs occasionally. Risk factors include smoking/tobacco exposure. He has tried a PPI for the symptoms. The treatment provided moderate relief.  Benign Prostatic Hypertrophy This is a chronic problem. The current episode started more than 1 year ago. Irritative symptoms include nocturia (1).  Nicotine Dependence Presents for follow-up visit. Symptoms include insomnia. His urge triggers include company of smokers. The symptoms have been stable. He smokes < 1/2 a pack of cigarettes per day.  Insomnia Primary symptoms: difficulty falling asleep, frequent awakening, malaise/fatigue.   The current episode started more than one year. The onset quality is gradual. The problem occurs intermittently.  Hyperlipidemia This is a chronic problem. The current episode started more than 1 year ago. The problem is controlled. Recent lipid tests were reviewed and are normal. Pertinent negatives include no shortness of breath. Current antihyperlipidemic treatment includes statins. The current treatment provides moderate improvement of lipids. Risk factors for coronary artery disease include dyslipidemia, hypertension, male sex and a sedentary lifestyle.  Anxiety Presents for follow-up visit. Symptoms include excessive worry, insomnia, nervous/anxious behavior and restlessness. Patient reports no shortness of breath. Symptoms occur occasionally. The severity of symptoms is moderate.    Back Pain This is a chronic  problem. The current episode started more than 1 year ago. The problem has been waxing and waning since onset. The pain is present in the lumbar spine. The quality of the pain is described as aching. The pain is at a severity of 6/10. The pain is moderate.      Review of Systems  Constitutional:  Positive for malaise/fatigue.   Respiratory:  Negative for shortness of breath.   Gastrointestinal:  Positive for heartburn.  Genitourinary:  Positive for nocturia (1).  Musculoskeletal:  Positive for back pain.  Psychiatric/Behavioral:  The patient is nervous/anxious and has insomnia.   All other systems reviewed and are negative.      Objective:   Physical Exam Vitals reviewed.  Constitutional:      General: He is not in acute distress.    Appearance: He is well-developed.  HENT:     Head: Normocephalic.     Right Ear: Tympanic membrane normal.     Left Ear: Tympanic membrane normal.  Eyes:     General:        Right eye: No discharge.        Left eye: No discharge.     Pupils: Pupils are equal, round, and reactive to light.  Neck:     Thyroid: No thyromegaly.  Cardiovascular:     Rate and Rhythm: Normal rate and regular rhythm.     Heart sounds: Normal heart sounds. No murmur heard. Pulmonary:     Effort: Pulmonary effort is normal. No respiratory distress.     Breath sounds: Normal breath sounds. No wheezing.  Abdominal:     General: Bowel sounds are normal. There is no distension.     Palpations: Abdomen is soft.     Tenderness: There is no abdominal tenderness.  Musculoskeletal:        General: Tenderness present.     Cervical back: Normal range of motion and neck supple.     Comments: Pain in lumbar with flexion and extension  Skin:    General: Skin is warm and dry.     Findings: No erythema or rash.  Neurological:     Mental Status: He is alert and oriented to person, place, and time.     Cranial Nerves: No cranial nerve deficit.     Motor: Weakness (using cane) present.     Gait: Gait abnormal.     Deep Tendon Reflexes: Reflexes are normal and symmetric.  Psychiatric:        Behavior: Behavior normal.        Thought Content: Thought content normal.        Judgment: Judgment normal.      BP (!) 149/74   Pulse 69   Temp (!) 96.6 F (35.9 C) (Temporal)   Ht 5\' 4"  (1.626 m)   Wt  142 lb (64.4 kg)   SpO2 94%   BMI 24.37 kg/m      Assessment & Plan:   [name] comes in today with chief complaint of Medical Management of Chronic Issues and Foot Pain (On out side of feet wants to see if maybe a higher dose of lyrica will help)   Diagnosis and orders addressed:  1. GAD (generalized anxiety disorder) - ALPRAZolam (XANAX) 1 MG tablet; TAKE 1 TABLET BY MOUTH TWICE DAILY . APPOINTMENT REQUIRED FOR FUTURE REFILLS  Dispense: 60 tablet; Refill: 2 - BMP8+EGFR  2. Paroxysmal atrial fibrillation (HCC) - BMP8+EGFR  3. Essential hypertension - lisinopril (ZESTRIL) 40 MG tablet; Take 1  tablet (40 mg total) by mouth daily.  Dispense: 90 tablet; Refill: 1 - BMP8+EGFR  4. Gastroesophageal reflux disease, unspecified whether esophagitis present - pantoprazole (PROTONIX) 40 MG tablet; Take 1 tablet (40 mg total) by mouth daily.  Dispense: 90 tablet; Refill: 1 - BMP8+EGFR  5. Alcoholic peripheral neuropathy (HCC) - pregabalin (LYRICA) 50 MG capsule; Take 1 capsule (50 mg total) by mouth 2 (two) times daily.  Dispense: 60 capsule; Refill: 2 - BMP8+EGFR  6. Benign prostatic hyperplasia with incomplete bladder emptying - tamsulosin (FLOMAX) 0.4 MG CAPS capsule; Take 1 capsule (0.4 mg total) by mouth daily.  Dispense: 90 capsule; Refill: 0 - BMP8+EGFR  7. Aortic atherosclerosis (HCC) - BMP8+EGFR  8. Benzodiazepine dependence (HCC) - BMP8+EGFR  9. Controlled substance agreement signed - BMP8+EGFR  10. Hyperlipidemia, unspecified hyperlipidemia type - BMP8+EGFR  11. Insomnia, unspecified type - BMP8+EGFR  12. Acute bilateral low back pain without sciatica Referral to Ortho ROM exercises  - Ambulatory referral to Orthopedics   Labs pending Patient reviewed in [place] controlled database, no flags noted. Contract and drug screen are up to date.  Health Maintenance reviewed Diet and exercise encouraged  Follow up plan: 3 months    Jannifer Rodney, FNP

## 2022-12-20 NOTE — Patient Instructions (Signed)
Acute Back Pain, Adult Acute back pain is sudden and usually short-lived. It is often caused by an injury to the muscles and tissues in the back. The injury may result from: A muscle, tendon, or ligament getting overstretched or torn. Ligaments are tissues that connect bones to each other. Lifting something improperly can cause a back strain. Wear and tear (degeneration) of the spinal disks. Spinal disks are circular tissue that provide cushioning between the bones of the spine (vertebrae). Twisting motions, such as while playing sports or doing yard work. A hit to the back. Arthritis. You may have a physical exam, lab tests, and imaging tests to find the cause of your pain. Acute back pain usually goes away with rest and home care. Follow these instructions at home: Managing pain, stiffness, and swelling Take over-the-counter and prescription medicines only as told by your health care provider. Treatment may include medicines for pain and inflammation that are taken by mouth or applied to the skin, or muscle relaxants. Your health care provider may recommend applying ice during the first 24-48 hours after your pain starts. To do this: Put ice in a plastic bag. Place a towel between your skin and the bag. Leave the ice on for 20 minutes, 2-3 times a day. Remove the ice if your skin turns bright red. This is very important. If you cannot feel pain, heat, or cold, you have a greater risk of damage to the area. If directed, apply heat to the affected area as often as told by your health care provider. Use the heat source that your health care provider recommends, such as a moist heat pack or a heating pad. Place a towel between your skin and the heat source. Leave the heat on for 20-30 minutes. Remove the heat if your skin turns bright red. This is especially important if you are unable to feel pain, heat, or cold. You have a greater risk of getting burned. Activity  Do not stay in bed. Staying in  bed for more than 1-2 days can delay your recovery. Sit up and stand up straight. Avoid leaning forward when you sit or hunching over when you stand. If you work at a desk, sit close to it so you do not need to lean over. Keep your chin tucked in. Keep your neck drawn back, and keep your elbows bent at a 90-degree angle (right angle). Sit high and close to the steering wheel when you drive. Add lower back (lumbar) support to your car seat, if needed. Take short walks on even surfaces as soon as you are able. Try to increase the length of time you walk each day. Do not sit, drive, or stand in one place for more than 30 minutes at a time. Sitting or standing for long periods of time can put stress on your back. Do not drive or use heavy machinery while taking prescription pain medicine. Use proper lifting techniques. When you bend and lift, use positions that put less stress on your back: Bend your knees. Keep the load close to your body. Avoid twisting. Exercise regularly as told by your health care provider. Exercising helps your back heal faster and helps prevent back injuries by keeping muscles strong and flexible. Work with a physical therapist to make a safe exercise program, as recommended by your health care provider. Do any exercises as told by your physical therapist. Lifestyle Maintain a healthy weight. Extra weight puts stress on your back and makes it difficult to have good   posture. Avoid activities or situations that make you feel anxious or stressed. Stress and anxiety increase muscle tension and can make back pain worse. Learn ways to manage anxiety and stress, such as through exercise. General instructions Sleep on a firm mattress in a comfortable position. Try lying on your side with your knees slightly bent. If you lie on your back, put a pillow under your knees. Keep your head and neck in a straight line with your spine (neutral position) when using electronic equipment like  smartphones or pads. To do this: Raise your smartphone or pad to look at it instead of bending your head or neck to look down. Put the smartphone or pad at the level of your face while looking at the screen. Follow your treatment plan as told by your health care provider. This may include: Cognitive or behavioral therapy. Acupuncture or massage therapy. Meditation or yoga. Contact a health care provider if: You have pain that is not relieved with rest or medicine. You have increasing pain going down into your legs or buttocks. Your pain does not improve after 2 weeks. You have pain at night. You lose weight without trying. You have a fever or chills. You develop nausea or vomiting. You develop abdominal pain. Get help right away if: You develop new bowel or bladder control problems. You have unusual weakness or numbness in your arms or legs. You feel faint. These symptoms may represent a serious problem that is an emergency. Do not wait to see if the symptoms will go away. Get medical help right away. Call your local emergency services (911 in the [place] Do not drive yourself to the hospital. Summary Acute back pain is sudden and usually short-lived. Use proper lifting techniques. When you bend and lift, use positions that put less stress on your back. Take over-the-counter and prescription medicines only as told by your health care provider, and apply heat or ice as told. This information is not intended to replace advice given to you by your health care provider. Make sure you discuss any questions you have with your health care provider. Document Revised: 08/28/2020 Document Reviewed: 08/28/2020 [employer]vier Patient Education  2024 [employer]  

## 2022-12-21 LAB — BMP8+EGFR
BUN/Creatinine Ratio: 18 (ref 10–24)
BUN: 33 mg/dL — ABNORMAL HIGH (ref 8–27)
CO2: 24 mmol/L (ref 20–29)
Calcium: 9.3 mg/dL (ref 8.6–10.2)
Chloride: 99 mmol/L (ref 96–106)
Creatinine, Ser: 1.79 mg/dL — ABNORMAL HIGH (ref 0.76–1.27)
Glucose: 120 mg/dL — ABNORMAL HIGH (ref 70–99)
Potassium: 4 mmol/L (ref 3.5–5.2)
Sodium: 141 mmol/L (ref 134–144)
eGFR: 38 mL/min/{1.73_m2} — ABNORMAL LOW (ref >59)

## 2022-12-29 ENCOUNTER — Encounter: Payer: Self-pay | Admitting: Physical Medicine and Rehabilitation

## 2022-12-29 ENCOUNTER — Ambulatory Visit (INDEPENDENT_AMBULATORY_CARE_PROVIDER_SITE_OTHER): Payer: Medicare Other | Admitting: Physical Medicine and Rehabilitation

## 2022-12-29 DIAGNOSIS — R269 Unspecified abnormalities of gait and mobility: Secondary | ICD-10-CM | POA: Diagnosis not present

## 2022-12-29 DIAGNOSIS — M25561 Pain in right knee: Secondary | ICD-10-CM | POA: Diagnosis not present

## 2022-12-29 DIAGNOSIS — M5416 Radiculopathy, lumbar region: Secondary | ICD-10-CM

## 2022-12-29 DIAGNOSIS — G8929 Other chronic pain: Secondary | ICD-10-CM | POA: Diagnosis not present

## 2022-12-29 NOTE — Progress Notes (Signed)
Functional Pain Scale - descriptive words and definitions  Uncomfortable (3)  Pain is present but can complete all ADL's/sleep is slightly affected and passive distraction only gives marginal relief. Mild range order  Average Pain 8  Lower back pain that radiates into the knees

## 2022-12-29 NOTE — Progress Notes (Signed)
[name] - 82 y.o. male MRN [id number]  Date of birth: Apr 15, 1941  Office Visit Note: Visit Date: 12/29/2022 PCP: Junie Spencer, FNP Referred by: Junie Spencer, FNP  Subjective: Chief Complaint  Patient presents with  . Lower Back - Pain   HPI: [name] is a 82 y.o. male who comes in today per the request of Jannifer Rodney, FNP for evaluation of chronic, worsening and severe bilateral lower back pain, radiating up to thoracic back and right knee. Patient is poor historian, daughter accompanying him during our visit today. Pain ongoing chronically for several years, worsened over the last several months. His pain becomes worse with walking and activity. He describes pain as sore and aching. States his lower back pain contributes to poor gait and balance. Some relief of pain with rest and use of Tylenol. He continues with Lyrica for neuropathy, prescribed by PCP.  No history of formal physical therapy. CT scan of lumbar spine from 2023 exhibits multi level degenerative changes, lateral recess narrowing right greater than left at L4-L5, there is also bilateral foraminal stenosis at L5-S1. No high grade spinal canal stenosis noted. No history of lumbar surgery/injections. He currently ambulates with cane. Patient denies focal weakness. No recent trauma or falls.  Patient does have significant medical history including ventricular tachycardia, heart failure, atrial fibrillation, CVA, alcohol abuse and alcoholic peripheral neuropathy. Daughter states he stopped drinking December of 2023.   Review of Systems  Musculoskeletal:  Positive for back pain.  Neurological:  Positive for tingling. Negative for sensory change, focal weakness and weakness.       Tingling and numbness to bilateral lower extremities.   All other systems reviewed and are negative.  Otherwise per HPI.  Assessment & Plan: Visit Diagnoses:    ICD-10-CM   1. Lumbar radiculopathy  M54.16     2. Gait  abnormality  R26.9     3. Chronic pain of right knee  M25.561    G89.29        Plan: Findings:  Chronic, worsening and severe bilateral lower back pain radiating up to thoracic spine and right knee. Patient continues to have severe pain despite good conservative therapies such as rest and use of Tylenol. Patients clinical presentation and exam are complex, differentials include facet mediated pain vs myofascial pain. Tenderness noted to bilateral thoracic and lumbar paraspinal regions. I discussed treatment plan with patient and daughter today including possibility of lumbar epidural steroid injection. Patient is a poor historian, hard to get a good idea if he understands injection procedure. He would also need permission to come off Eliquis if we chose interlaminar approach. Patient would like to start with regimen of formal physical therapy before moving forward with interventional spine procedures. He is requesting in home PT. I will talk with our case manager Sheri to help arrange this. Should his pain persist we could consider injection, will re-visit this at later date. I encouraged patient to remain active as tolerated. No red flag symptoms noted upon exam today.     Meds & Orders: No orders of the defined types were placed in this encounter.  No orders of the defined types were placed in this encounter.   Follow-up: No follow-ups on file.   Procedures: No procedures performed      Clinical History: EXAM:  CT LUMBAR SPINE WITHOUT CONTRAST   TECHNIQUE:  Multidetector CT imaging of the lumbar spine was performed without  intravenous contrast administration. Multiplanar CT image  reconstructions were also generated.   RADIATION DOSE REDUCTION: This exam was performed according to the  departmental dose-optimization program which includes automated  exposure control, adjustment of the mA and/or kV according to  patient size and/or use of iterative reconstruction technique.    COMPARISON:  CT abdomen 03/06/2011   FINDINGS:  Segmentation: 5 lumbar type vertebral bodies.   Alignment: Mild scoliotic curvature convex to the right.   Vertebrae: No evidence of regional fracture or focal bone lesion of  significance.   Paraspinal and other soft tissues: Bilateral pleural fluid, right  more than left. Aortic atherosclerosis.   Disc levels: T11-12: Minimal disc bulge.  No stenosis.   T12-L1: Minimal disc bulge.  No stenosis.   L1-2: Disc degeneration with vacuum phenomenon. Mild bulging of the  disc. No stenosis.   L2-3: Disc degeneration with vacuum phenomenon. Mild bulging of the  disc. No stenosis.   L3-4: Disc degeneration. Bulging of the disc. Mild narrowing of the  lateral recesses but no definite neural compression.   L4-5: Moderate bulging of the disc. Facet and ligamentous  hypertrophy. Stenosis of the lateral recesses right more than left.  Neural compression could occur at this level.   L5-S1: Disc degeneration with loss of disc height. Endplate  osteophytes. No canal stenosis. Bilateral foraminal narrowing could  affect either L5 nerve.   IMPRESSION:  1. No evidence of regional fracture or focal bone lesion.  2. Degenerative disc disease and degenerative facet disease  throughout the lumbar region as outlined above.  3. L4-5: Bulging of the disc. Facet and ligamentous hypertrophy.  Stenosis of the lateral recesses right more than left. Neural  compression could occur at this level.  4. L5-S1: Disc degeneration with loss of disc height. Endplate  osteophytes. No canal stenosis. Bilateral foraminal narrowing could  affect either L5 nerve.  5. Bilateral pleural fluid, right more than left.  6. Aortic atherosclerosis.   Aortic Atherosclerosis (ICD10-I70.0).    Electronically Signed    By: Paulina Fusi M.D.    On: 04/25/2022 11:35   He reports that he has been smoking cigarettes. He has a 11.3 pack-year smoking history. He quit  smokeless tobacco use about 33 years ago.  His smokeless tobacco use included chew. No results for input(s): "HGBA1C", "LABURIC" in the last 8760 hours.  Objective:  VS:  HT:    WT:   BMI:     BP:   HR: bpm  TEMP: ( )  RESP:  Physical Exam Vitals and nursing note reviewed.  HENT:     Head: Normocephalic and atraumatic.     Right Ear: External ear normal.     Left Ear: External ear normal.     Nose: Nose normal.     Mouth/Throat:     Mouth: Mucous membranes are moist.  Eyes:     Extraocular Movements: Extraocular movements intact.  Cardiovascular:     Rate and Rhythm: Normal rate.     Pulses: Normal pulses.  Pulmonary:     Effort: Pulmonary effort is normal.  Abdominal:     General: Abdomen is flat. There is no distension.  Musculoskeletal:        General: Tenderness present.     Cervical back: Normal range of motion.     Comments: Patient is slow to rise from seated position to standing. Good lumbar range of motion. No pain noted with facet loading. 5/5 strength noted with bilateral hip flexion, knee flexion/extension, ankle dorsiflexion/plantarflexion and EHL. No clonus  noted bilaterally. No pain upon palpation of greater trochanters. No pain with internal/external rotation of bilateral hips. Sensation intact bilaterally. Negative slump test bilaterally. Ambulates with cane, gait slow and unsteady.   Skin:    General: Skin is warm and dry.     Capillary Refill: Capillary refill takes less than 2 seconds.  Neurological:     Mental Status: He is alert.     Gait: Gait abnormal.  Psychiatric:        Mood and Affect: Mood normal.        Behavior: Behavior normal.    Ortho Exam  Imaging: No results found.  Past Medical/Family/Surgical/Social History: Medications & Allergies reviewed per EMR, new medications updated. Patient Active Problem List   Diagnosis Date Noted  . Leg swelling 08/16/2022  . Acute on chronic combined systolic and diastolic HF (heart failure) (HCC)  07/31/2022  . Dyspnea 07/28/2022  . BPH (benign prostatic hyperplasia) 07/24/2022  . CKD stage G3a/A1, GFR 45-59 and albumin creatinine ratio <30 mg/g (HCC) 07/24/2022  . Normocytic anemia 07/24/2022  . Late effect of cerebrovascular accident (CVA) 06/16/2022  . CHF, acute on chronic (HCC) 05/17/2022  . Cerebrovascular accident (CVA) due to occlusion of small artery (HCC) 05/13/2022  . OSA (obstructive sleep apnea) 02/14/2022  . Paroxysmal atrial fibrillation (HCC) 10/25/2021  . Secondary hypercoagulable state (HCC) 10/25/2021  . Benign prostatic hyperplasia with urinary frequency 05/10/2021  . Centrilobular emphysema (HCC) 12/17/2020  . Aortic atherosclerosis (HCC) 12/17/2020  . Central sleep apnea due to Cheyne-Stokes respiration 02/27/2020  . Loud snoring 11/12/2019  . GERD with apnea 11/12/2019  . Alcoholic peripheral neuropathy (HCC) 10/15/2019  . Apnea 10/15/2019  . Benzodiazepine dependence (HCC) 05/06/2019  . Controlled substance agreement signed 05/06/2019  . Psoriasis 05/02/2017  . Thoracic aorta atherosclerosis (HCC) 07/27/2016  . Onychomycosis 07/01/2016  . GAD (generalized anxiety disorder) 11/03/2014  . Essential hypertension 11/03/2014  . GERD (gastroesophageal reflux disease) 11/03/2014  . Allergic rhinitis 11/03/2014  . Nonischemic cardiomyopathy (HCC) 02/26/2014  . Chronic systolic heart failure (HCC) 10/15/2012  . Personal history of adenomatous colonic polyp 01/18/2012  . Spondylosis 09/09/2010  . Insomnia 09/09/2010  . IBS (irritable bowel syndrome) 09/09/2010  . PAROXYSMAL VENTRICULAR TACHYCARDIA 07/21/2010  . Hyperlipidemia 08/17/2009  . Alcohol abuse, in remission 08/17/2009  . TOBACCO ABUSE 08/17/2009  . Coronary atherosclerosis 08/17/2009   Past Medical History:  Diagnosis Date  . Anxiety   . Arthritis   . CAD (coronary artery disease)    Nonobstructive cath 2012.  PCI to RCA 2003  . Carotid artery disease (HCC)   . Cataract   . CHF  (congestive heart failure) (HCC)   . COPD (chronic obstructive pulmonary disease) (HCC)   . Depression   . Dyslipidemia   . EtOH dependence (HCC)   . GERD (gastroesophageal reflux disease)   . Hyperlipidemia   . Hypertension   . Insomnia   . Nonischemic cardiomyopathy (HCC)    EF was 20% (60% last echo 2015)  . Pacemaker    and defibrillator  . PAF (paroxysmal atrial fibrillation) (HCC)   . Peptic ulcer disease   . Tobacco abuse    Family History  Problem Relation Age of Onset  . Heart failure Father   . Colon polyps Sister   . GI Bleed Sister   . GI Bleed Brother   . Heart attack Brother   . Psoriasis Daughter   . Arthritis/Rheumatoid Daughter   . Cancer Brother   . Heart disease Brother   .  Heart disease Brother   . Colon cancer Neg Hx   . Esophageal cancer Neg Hx   . Rectal cancer Neg Hx   . Stomach cancer Neg Hx    Past Surgical History:  Procedure Laterality Date  . BIV ICD GENERTAOR CHANGE OUT N/A 03/06/2014   [employer] Gen change by Dr Johney Frame Lianne Moris CRT-D with LV1 header)  . biventricular defibrillator implantation     [employer] and pacemaker  . CATARACT EXTRACTION W/PHACO Right 12/10/2015   Procedure: CATARACT EXTRACTION PHACO AND INTRAOCULAR LENS PLACEMENT RIGHT EYE; CDE: 19.30;  Surgeon: Gemma Payor, MD;  Location: [facility];  Service: Ophthalmology;  Laterality: Right;  . CATARACT EXTRACTION W/PHACO Left 01/14/2016   Procedure: CATARACT EXTRACTION PHACO AND INTRAOCULAR LENS PLACEMENT LEFT EYE; CDE:  8.03;  Surgeon: Gemma Payor, MD;  Location: [facility];  Service: Ophthalmology;  Laterality: Left;  . COLONOSCOPY    . EYE SURGERY    . Ulcer surgery     repair of stomach ulcer   Social History   Occupational History  . Occupation: Retired    Associate Professor: [employer]  Tobacco Use  . Smoking status: Some Days    Current packs/day: 0.25    Average packs/day: 0.3 packs/day for 45.0 years (11.3 ttl pk-yrs)    Types: Cigarettes  . Smokeless  tobacco: Former    Types: Chew    Quit date: 06/20/1989  . Tobacco comments:    he is not interested in cessation    Patient started back smoking about 3 Cigarettes a daily  Vaping Use  . Vaping status: Never Used  Substance and Sexual Activity  . Alcohol use: Yes    Alcohol/week: 6.0 standard drinks of alcohol    Types: 6 Cans of beer per week    Comment: drings 4-6 beers per day  . Drug use: No    Types: Marijuana    Comment: last used marijuana yesterday  . Sexual activity: Not Currently    Birth control/protection: None

## 2023-01-06 DIAGNOSIS — M5136 Other intervertebral disc degeneration, lumbar region: Secondary | ICD-10-CM | POA: Diagnosis not present

## 2023-01-06 DIAGNOSIS — I5042 Chronic combined systolic (congestive) and diastolic (congestive) heart failure: Secondary | ICD-10-CM | POA: Diagnosis not present

## 2023-01-06 DIAGNOSIS — I480 Paroxysmal atrial fibrillation: Secondary | ICD-10-CM | POA: Diagnosis not present

## 2023-01-06 DIAGNOSIS — R269 Unspecified abnormalities of gait and mobility: Secondary | ICD-10-CM | POA: Diagnosis not present

## 2023-01-06 DIAGNOSIS — Z7901 Long term (current) use of anticoagulants: Secondary | ICD-10-CM | POA: Diagnosis not present

## 2023-01-06 DIAGNOSIS — M48061 Spinal stenosis, lumbar region without neurogenic claudication: Secondary | ICD-10-CM | POA: Diagnosis not present

## 2023-01-06 DIAGNOSIS — M5416 Radiculopathy, lumbar region: Secondary | ICD-10-CM | POA: Diagnosis not present

## 2023-01-06 DIAGNOSIS — M4808 Spinal stenosis, sacral and sacrococcygeal region: Secondary | ICD-10-CM | POA: Diagnosis not present

## 2023-01-06 DIAGNOSIS — I700 Atherosclerosis of aorta: Secondary | ICD-10-CM | POA: Diagnosis not present

## 2023-01-11 DIAGNOSIS — M4808 Spinal stenosis, sacral and sacrococcygeal region: Secondary | ICD-10-CM | POA: Diagnosis not present

## 2023-01-11 DIAGNOSIS — M5416 Radiculopathy, lumbar region: Secondary | ICD-10-CM | POA: Diagnosis not present

## 2023-01-11 DIAGNOSIS — I480 Paroxysmal atrial fibrillation: Secondary | ICD-10-CM | POA: Diagnosis not present

## 2023-01-11 DIAGNOSIS — I5042 Chronic combined systolic (congestive) and diastolic (congestive) heart failure: Secondary | ICD-10-CM | POA: Diagnosis not present

## 2023-01-11 DIAGNOSIS — Z7901 Long term (current) use of anticoagulants: Secondary | ICD-10-CM | POA: Diagnosis not present

## 2023-01-11 DIAGNOSIS — M5136 Other intervertebral disc degeneration, lumbar region: Secondary | ICD-10-CM | POA: Diagnosis not present

## 2023-01-11 DIAGNOSIS — M48061 Spinal stenosis, lumbar region without neurogenic claudication: Secondary | ICD-10-CM | POA: Diagnosis not present

## 2023-01-11 DIAGNOSIS — R269 Unspecified abnormalities of gait and mobility: Secondary | ICD-10-CM | POA: Diagnosis not present

## 2023-01-11 DIAGNOSIS — I700 Atherosclerosis of aorta: Secondary | ICD-10-CM | POA: Diagnosis not present

## 2023-01-12 ENCOUNTER — Other Ambulatory Visit (HOSPITAL_COMMUNITY): Payer: Self-pay | Admitting: Cardiology

## 2023-01-12 DIAGNOSIS — I480 Paroxysmal atrial fibrillation: Secondary | ICD-10-CM

## 2023-01-12 NOTE — Telephone Encounter (Signed)
Eliquis 5mg  refill request received. Patient is 82 years old, weight-64.4kg, Crea- 1.79 on 12/20/22, Diagnosis-Afib, and last seen by Dr. Antoine Poche on 11/23/22. Dose is appropriate based on dosing criteria (will turn 82 in December-may need dose change at that time if creatinine remains >1.5). Will send in refill to requested pharmacy.

## 2023-01-13 DIAGNOSIS — M5416 Radiculopathy, lumbar region: Secondary | ICD-10-CM | POA: Diagnosis not present

## 2023-01-13 DIAGNOSIS — I700 Atherosclerosis of aorta: Secondary | ICD-10-CM | POA: Diagnosis not present

## 2023-01-13 DIAGNOSIS — M5136 Other intervertebral disc degeneration, lumbar region: Secondary | ICD-10-CM | POA: Diagnosis not present

## 2023-01-13 DIAGNOSIS — M48061 Spinal stenosis, lumbar region without neurogenic claudication: Secondary | ICD-10-CM | POA: Diagnosis not present

## 2023-01-13 DIAGNOSIS — M4808 Spinal stenosis, sacral and sacrococcygeal region: Secondary | ICD-10-CM | POA: Diagnosis not present

## 2023-01-13 DIAGNOSIS — I480 Paroxysmal atrial fibrillation: Secondary | ICD-10-CM | POA: Diagnosis not present

## 2023-01-13 DIAGNOSIS — I5042 Chronic combined systolic (congestive) and diastolic (congestive) heart failure: Secondary | ICD-10-CM | POA: Diagnosis not present

## 2023-01-13 DIAGNOSIS — R269 Unspecified abnormalities of gait and mobility: Secondary | ICD-10-CM | POA: Diagnosis not present

## 2023-01-13 DIAGNOSIS — Z7901 Long term (current) use of anticoagulants: Secondary | ICD-10-CM | POA: Diagnosis not present

## 2023-01-16 DIAGNOSIS — M5136 Other intervertebral disc degeneration, lumbar region: Secondary | ICD-10-CM | POA: Diagnosis not present

## 2023-01-16 DIAGNOSIS — I5042 Chronic combined systolic (congestive) and diastolic (congestive) heart failure: Secondary | ICD-10-CM | POA: Diagnosis not present

## 2023-01-16 DIAGNOSIS — M5416 Radiculopathy, lumbar region: Secondary | ICD-10-CM | POA: Diagnosis not present

## 2023-01-16 DIAGNOSIS — I700 Atherosclerosis of aorta: Secondary | ICD-10-CM | POA: Diagnosis not present

## 2023-01-16 DIAGNOSIS — M4808 Spinal stenosis, sacral and sacrococcygeal region: Secondary | ICD-10-CM | POA: Diagnosis not present

## 2023-01-16 DIAGNOSIS — I480 Paroxysmal atrial fibrillation: Secondary | ICD-10-CM | POA: Diagnosis not present

## 2023-01-16 DIAGNOSIS — Z7901 Long term (current) use of anticoagulants: Secondary | ICD-10-CM | POA: Diagnosis not present

## 2023-01-16 DIAGNOSIS — R269 Unspecified abnormalities of gait and mobility: Secondary | ICD-10-CM | POA: Diagnosis not present

## 2023-01-16 DIAGNOSIS — M48061 Spinal stenosis, lumbar region without neurogenic claudication: Secondary | ICD-10-CM | POA: Diagnosis not present

## 2023-01-18 DIAGNOSIS — I700 Atherosclerosis of aorta: Secondary | ICD-10-CM | POA: Diagnosis not present

## 2023-01-18 DIAGNOSIS — M4808 Spinal stenosis, sacral and sacrococcygeal region: Secondary | ICD-10-CM | POA: Diagnosis not present

## 2023-01-18 DIAGNOSIS — M5416 Radiculopathy, lumbar region: Secondary | ICD-10-CM | POA: Diagnosis not present

## 2023-01-18 DIAGNOSIS — Z7901 Long term (current) use of anticoagulants: Secondary | ICD-10-CM | POA: Diagnosis not present

## 2023-01-18 DIAGNOSIS — I5042 Chronic combined systolic (congestive) and diastolic (congestive) heart failure: Secondary | ICD-10-CM | POA: Diagnosis not present

## 2023-01-18 DIAGNOSIS — M5136 Other intervertebral disc degeneration, lumbar region: Secondary | ICD-10-CM | POA: Diagnosis not present

## 2023-01-18 DIAGNOSIS — M48061 Spinal stenosis, lumbar region without neurogenic claudication: Secondary | ICD-10-CM | POA: Diagnosis not present

## 2023-01-18 DIAGNOSIS — I480 Paroxysmal atrial fibrillation: Secondary | ICD-10-CM | POA: Diagnosis not present

## 2023-01-18 DIAGNOSIS — R269 Unspecified abnormalities of gait and mobility: Secondary | ICD-10-CM | POA: Diagnosis not present

## 2023-01-20 DIAGNOSIS — I10 Essential (primary) hypertension: Secondary | ICD-10-CM | POA: Diagnosis not present

## 2023-01-20 DIAGNOSIS — Z515 Encounter for palliative care: Secondary | ICD-10-CM | POA: Diagnosis not present

## 2023-01-20 DIAGNOSIS — I5032 Chronic diastolic (congestive) heart failure: Secondary | ICD-10-CM | POA: Diagnosis not present

## 2023-01-20 DIAGNOSIS — G629 Polyneuropathy, unspecified: Secondary | ICD-10-CM | POA: Diagnosis not present

## 2023-01-23 ENCOUNTER — Ambulatory Visit (INDEPENDENT_AMBULATORY_CARE_PROVIDER_SITE_OTHER): Payer: Medicare Other | Admitting: Nurse Practitioner

## 2023-01-23 ENCOUNTER — Encounter: Payer: Self-pay | Admitting: Nurse Practitioner

## 2023-01-23 VITALS — BP 138/84 | HR 69 | Temp 96.8°F | Resp 20 | Ht 64.0 in | Wt 143.0 lb

## 2023-01-23 DIAGNOSIS — I10 Essential (primary) hypertension: Secondary | ICD-10-CM | POA: Diagnosis not present

## 2023-01-23 NOTE — Patient Instructions (Signed)

## 2023-01-23 NOTE — Progress Notes (Signed)
Subjective:    Patient ID: [name], male    DOB: March 09, 1941, 82 y.o.   MRN: [id number]   Chief Complaint: Hypertension (Worried that potassium is high/)   Hypertension Pertinent negatives include no chest pain, headaches, palpitations or shortness of breath.    Patient brought in by his daughter. Blood pressure has been elevated.. has been running around 150-160 systolic. Last time he had issues his blood pressure , his potassium was out of whack.   Patient Active Problem List   Diagnosis Date Noted   Leg swelling 08/16/2022   Acute on chronic combined systolic and diastolic HF (heart failure) (HCC) 07/31/2022   Dyspnea 07/28/2022   BPH (benign prostatic hyperplasia) 07/24/2022   CKD stage G3a/A1, GFR 45-59 and albumin creatinine ratio <30 mg/g (HCC) 07/24/2022   Normocytic anemia 07/24/2022   Late effect of cerebrovascular accident (CVA) 06/16/2022   CHF, acute on chronic (HCC) 05/17/2022   Cerebrovascular accident (CVA) due to occlusion of small artery (HCC) 05/13/2022   OSA (obstructive sleep apnea) 02/14/2022   Paroxysmal atrial fibrillation (HCC) 10/25/2021   Secondary hypercoagulable state (HCC) 10/25/2021   Benign prostatic hyperplasia with urinary frequency 05/10/2021   Centrilobular emphysema (HCC) 12/17/2020   Aortic atherosclerosis (HCC) 12/17/2020   Central sleep apnea due to Cheyne-Stokes respiration 02/27/2020   Loud snoring 11/12/2019   GERD with apnea 11/12/2019   Alcoholic peripheral neuropathy (HCC) 10/15/2019   Apnea 10/15/2019   Benzodiazepine dependence (HCC) 05/06/2019   Controlled substance agreement signed 05/06/2019   Psoriasis 05/02/2017   Thoracic aorta atherosclerosis (HCC) 07/27/2016   Onychomycosis 07/01/2016   GAD (generalized anxiety disorder) 11/03/2014   Essential hypertension 11/03/2014   GERD (gastroesophageal reflux disease) 11/03/2014   Allergic rhinitis 11/03/2014   Nonischemic cardiomyopathy (HCC) 02/26/2014   Chronic  systolic heart failure (HCC) 10/15/2012   Personal history of adenomatous colonic polyp 01/18/2012   Spondylosis 09/09/2010   Insomnia 09/09/2010   IBS (irritable bowel syndrome) 09/09/2010   PAROXYSMAL VENTRICULAR TACHYCARDIA 07/21/2010   Hyperlipidemia 08/17/2009   Alcohol abuse, in remission 08/17/2009   TOBACCO ABUSE 08/17/2009   Coronary atherosclerosis 08/17/2009       Review of Systems  Constitutional:  Negative for diaphoresis.  Eyes:  Negative for pain.  Respiratory:  Negative for shortness of breath.   Cardiovascular:  Negative for chest pain, palpitations and leg swelling.  Gastrointestinal:  Negative for abdominal pain.  Endocrine: Negative for polydipsia.  Skin:  Negative for rash.  Neurological:  Negative for dizziness, weakness and headaches.  Hematological:  Does not bruise/bleed easily.  All other systems reviewed and are negative.      Objective:   Physical Exam Vitals reviewed.  Constitutional:      Appearance: Normal appearance.  Cardiovascular:     Rate and Rhythm: Normal rate and regular rhythm.     Heart sounds: Normal heart sounds.  Pulmonary:     Effort: Pulmonary effort is normal.     Breath sounds: Normal breath sounds.  Skin:    General: Skin is warm.  Neurological:     General: No focal deficit present.     Mental Status: He is alert and oriented to person, place, and time.  Psychiatric:        Mood and Affect: Mood normal.        Behavior: Behavior normal.    BP 138/84   Pulse 69   Temp (!) 96.8 F (36 C) (Temporal)   Resp 20   Ht 5\' 4"  (  1.626 m)   Wt 143 lb (64.9 kg)   SpO2 97%   BMI 24.55 kg/m         Assessment & Plan:   [name] in today with chief complaint of Hypertension (Worried that potassium is high/)   1. Primary hypertension Continue to keep diary of blood pressure Low sodium diet - BMP8+EGFR - CBC with Differential/Platelet    The above assessment and management plan was discussed with the  patient. The patient verbalized understanding of and has agreed to the management plan. Patient is aware to call the clinic if symptoms persist or worsen. Patient is aware when to return to the clinic for a follow-up visit. Patient educated on when it is appropriate to go to the emergency department.   Mary-Margaret Daphine Deutscher, FNP

## 2023-01-24 ENCOUNTER — Ambulatory Visit (INDEPENDENT_AMBULATORY_CARE_PROVIDER_SITE_OTHER): Payer: Medicare Other

## 2023-01-24 ENCOUNTER — Other Ambulatory Visit: Payer: Self-pay | Admitting: Cardiovascular Disease

## 2023-01-24 DIAGNOSIS — I480 Paroxysmal atrial fibrillation: Secondary | ICD-10-CM | POA: Diagnosis not present

## 2023-01-24 DIAGNOSIS — I10 Essential (primary) hypertension: Secondary | ICD-10-CM

## 2023-01-24 LAB — CUP PACEART REMOTE DEVICE CHECK
Battery Remaining Longevity: 10 mo
Battery Remaining Percentage: 15 %
Brady Statistic RA Percent Paced: 1 %
Brady Statistic RV Percent Paced: 96 %
Date Time Interrogation Session: 20240806003100
HighPow Impedance: 59 Ohm
Implantable Lead Connection Status: 753985
Implantable Lead Connection Status: 753985
Implantable Lead Connection Status: 753985
Implantable Lead Implant Date: 20040312
Implantable Lead Implant Date: 20040312
Implantable Lead Implant Date: 20040312
Implantable Lead Location: 753858
Implantable Lead Location: 753859
Implantable Lead Location: 753860
Implantable Lead Model: 158
Implantable Lead Model: 4087
Implantable Lead Model: 4513
Implantable Lead Serial Number: 129158
Implantable Lead Serial Number: 209446
Implantable Lead Serial Number: 407264
Implantable Pulse Generator Implant Date: 20150917
Lead Channel Impedance Value: 1121 Ohm
Lead Channel Impedance Value: 603 Ohm
Lead Channel Impedance Value: 631 Ohm
Lead Channel Pacing Threshold Amplitude: 0.8 V
Lead Channel Pacing Threshold Amplitude: 1.2 V
Lead Channel Pacing Threshold Amplitude: 1.9 V
Lead Channel Pacing Threshold Pulse Width: 0.4 ms
Lead Channel Pacing Threshold Pulse Width: 0.4 ms
Lead Channel Pacing Threshold Pulse Width: 0.8 ms
Lead Channel Setting Pacing Amplitude: 2 V
Lead Channel Setting Pacing Amplitude: 2.5 V
Lead Channel Setting Pacing Amplitude: 3 V
Lead Channel Setting Pacing Pulse Width: 0.4 ms
Lead Channel Setting Pacing Pulse Width: 0.8 ms
Lead Channel Setting Sensing Sensitivity: 0.6 mV
Lead Channel Setting Sensing Sensitivity: 1 mV
Pulse Gen Serial Number: 106298

## 2023-01-25 DIAGNOSIS — M5416 Radiculopathy, lumbar region: Secondary | ICD-10-CM | POA: Diagnosis not present

## 2023-01-25 DIAGNOSIS — R269 Unspecified abnormalities of gait and mobility: Secondary | ICD-10-CM | POA: Diagnosis not present

## 2023-01-25 DIAGNOSIS — Z7901 Long term (current) use of anticoagulants: Secondary | ICD-10-CM | POA: Diagnosis not present

## 2023-01-25 DIAGNOSIS — I480 Paroxysmal atrial fibrillation: Secondary | ICD-10-CM | POA: Diagnosis not present

## 2023-01-25 DIAGNOSIS — M4808 Spinal stenosis, sacral and sacrococcygeal region: Secondary | ICD-10-CM | POA: Diagnosis not present

## 2023-01-25 DIAGNOSIS — M5136 Other intervertebral disc degeneration, lumbar region: Secondary | ICD-10-CM | POA: Diagnosis not present

## 2023-01-25 DIAGNOSIS — I700 Atherosclerosis of aorta: Secondary | ICD-10-CM | POA: Diagnosis not present

## 2023-01-25 DIAGNOSIS — I5042 Chronic combined systolic (congestive) and diastolic (congestive) heart failure: Secondary | ICD-10-CM | POA: Diagnosis not present

## 2023-01-25 DIAGNOSIS — M48061 Spinal stenosis, lumbar region without neurogenic claudication: Secondary | ICD-10-CM | POA: Diagnosis not present

## 2023-01-27 DIAGNOSIS — M48061 Spinal stenosis, lumbar region without neurogenic claudication: Secondary | ICD-10-CM | POA: Diagnosis not present

## 2023-01-27 DIAGNOSIS — I700 Atherosclerosis of aorta: Secondary | ICD-10-CM | POA: Diagnosis not present

## 2023-01-27 DIAGNOSIS — R269 Unspecified abnormalities of gait and mobility: Secondary | ICD-10-CM | POA: Diagnosis not present

## 2023-01-27 DIAGNOSIS — M5416 Radiculopathy, lumbar region: Secondary | ICD-10-CM | POA: Diagnosis not present

## 2023-01-27 DIAGNOSIS — M4808 Spinal stenosis, sacral and sacrococcygeal region: Secondary | ICD-10-CM | POA: Diagnosis not present

## 2023-01-27 DIAGNOSIS — Z7901 Long term (current) use of anticoagulants: Secondary | ICD-10-CM | POA: Diagnosis not present

## 2023-01-27 DIAGNOSIS — I5042 Chronic combined systolic (congestive) and diastolic (congestive) heart failure: Secondary | ICD-10-CM | POA: Diagnosis not present

## 2023-01-27 DIAGNOSIS — M5136 Other intervertebral disc degeneration, lumbar region: Secondary | ICD-10-CM | POA: Diagnosis not present

## 2023-01-27 DIAGNOSIS — I480 Paroxysmal atrial fibrillation: Secondary | ICD-10-CM | POA: Diagnosis not present

## 2023-01-31 DIAGNOSIS — M48061 Spinal stenosis, lumbar region without neurogenic claudication: Secondary | ICD-10-CM | POA: Diagnosis not present

## 2023-01-31 DIAGNOSIS — M4808 Spinal stenosis, sacral and sacrococcygeal region: Secondary | ICD-10-CM | POA: Diagnosis not present

## 2023-01-31 DIAGNOSIS — Z7901 Long term (current) use of anticoagulants: Secondary | ICD-10-CM | POA: Diagnosis not present

## 2023-01-31 DIAGNOSIS — M5136 Other intervertebral disc degeneration, lumbar region: Secondary | ICD-10-CM | POA: Diagnosis not present

## 2023-01-31 DIAGNOSIS — I700 Atherosclerosis of aorta: Secondary | ICD-10-CM | POA: Diagnosis not present

## 2023-01-31 DIAGNOSIS — R269 Unspecified abnormalities of gait and mobility: Secondary | ICD-10-CM | POA: Diagnosis not present

## 2023-01-31 DIAGNOSIS — M5416 Radiculopathy, lumbar region: Secondary | ICD-10-CM | POA: Diagnosis not present

## 2023-01-31 DIAGNOSIS — I5042 Chronic combined systolic (congestive) and diastolic (congestive) heart failure: Secondary | ICD-10-CM | POA: Diagnosis not present

## 2023-01-31 DIAGNOSIS — I480 Paroxysmal atrial fibrillation: Secondary | ICD-10-CM | POA: Diagnosis not present

## 2023-02-03 DIAGNOSIS — G4737 Central sleep apnea in conditions classified elsewhere: Secondary | ICD-10-CM | POA: Diagnosis not present

## 2023-02-03 DIAGNOSIS — R063 Periodic breathing: Secondary | ICD-10-CM | POA: Diagnosis not present

## 2023-02-04 ENCOUNTER — Other Ambulatory Visit: Payer: Self-pay | Admitting: Family

## 2023-02-04 DIAGNOSIS — I10 Essential (primary) hypertension: Secondary | ICD-10-CM

## 2023-02-09 NOTE — Progress Notes (Signed)
Remote ICD transmission.   

## 2023-02-10 ENCOUNTER — Encounter: Payer: Self-pay | Admitting: Family

## 2023-02-10 ENCOUNTER — Ambulatory Visit (INDEPENDENT_AMBULATORY_CARE_PROVIDER_SITE_OTHER): Payer: Medicare Other | Admitting: Family

## 2023-02-10 ENCOUNTER — Telehealth: Payer: Self-pay | Admitting: Family

## 2023-02-10 VITALS — BP 153/77 | HR 71 | Temp 98.7°F | Wt 145.6 lb

## 2023-02-10 DIAGNOSIS — M25571 Pain in right ankle and joints of right foot: Secondary | ICD-10-CM

## 2023-02-10 DIAGNOSIS — I480 Paroxysmal atrial fibrillation: Secondary | ICD-10-CM | POA: Diagnosis not present

## 2023-02-10 DIAGNOSIS — F1011 Alcohol abuse, in remission: Secondary | ICD-10-CM | POA: Diagnosis not present

## 2023-02-10 DIAGNOSIS — G621 Alcoholic polyneuropathy: Secondary | ICD-10-CM

## 2023-02-10 DIAGNOSIS — Z0279 Encounter for issue of other medical certificate: Secondary | ICD-10-CM

## 2023-02-10 MED ORDER — PREDNISONE 20 MG PO TABS
40.0000 mg | ORAL_TABLET | Freq: Every day | ORAL | 0 refills | Status: AC
Start: 2023-02-10 — End: 2023-02-15

## 2023-02-10 MED ORDER — PREGABALIN 75 MG PO CAPS
75.0000 mg | ORAL_CAPSULE | Freq: Two times a day (BID) | ORAL | 2 refills | Status: DC
Start: 2023-02-10 — End: 2023-06-26

## 2023-02-10 NOTE — Telephone Encounter (Signed)
Bonita Quin (daughter) dropped off [employer] forms to be completed and signed.  Form Fee Paid? (Y/N)       no     If NO, form is placed on front office manager desk to hold until payment received. If YES, then form will be placed in the RX/HH Nurse Coordinators box for completion.  Form will not be processed until payment is received   I wasn't sure, if it was a fee for [employer] Forms, so I told pt/daughter that we would let them know by calling them & daughter will pay over-the-phone, if needed. Lendon Colonel' pt Please call daughter, when forms are ready.

## 2023-02-10 NOTE — Telephone Encounter (Signed)
Pt pd for forms

## 2023-02-10 NOTE — Progress Notes (Signed)
Subjective:    Patient ID: [name], male    DOB: 29-Sep-1940, 82 y.o.   MRN: [id number]  Chief Complaint  Patient presents with   Foot Pain    Right and wrose and    Ankle Pain    Pt presents to the office today with bilateral foot pain. He had neuropathy and takes Lyrica 50 mg BID. Reports this had been helping with his pain. He has not drank alcohol since December.   He is complaining of right ankle pain that started a few weeks ago. Reports his pain is 8 out 10 when walking.  He was doing PT for his back and thinks he flared up his ankle.   He takes Eliquis 5 mg BID for hx of CVA and A Fib.  Foot Pain This is a chronic problem. The current episode started more than 1 year ago. The problem occurs intermittently. The symptoms are aggravated by standing. He has tried rest and acetaminophen for the symptoms. The treatment provided mild relief.  Ankle Pain       Review of Systems  All other systems reviewed and are negative.      Objective:   Physical Exam Vitals reviewed.  Constitutional:      General: He is not in acute distress.    Appearance: He is well-developed.  HENT:     Head: Normocephalic.  Eyes:     General:        Right eye: No discharge.        Left eye: No discharge.     Pupils: Pupils are equal, round, and reactive to light.  Neck:     Thyroid: No thyromegaly.  Cardiovascular:     Rate and Rhythm: Normal rate and regular rhythm.     Heart sounds: Normal heart sounds. No murmur heard. Pulmonary:     Effort: Pulmonary effort is normal. No respiratory distress.     Breath sounds: Normal breath sounds. No wheezing.  Abdominal:     General: Bowel sounds are normal. There is no distension.     Palpations: Abdomen is soft.     Tenderness: There is no abdominal tenderness.  Musculoskeletal:        General: Tenderness (right lateral ankle with palpation) present. Normal range of motion.     Cervical back: Normal range of motion and neck supple.   Skin:    General: Skin is warm and dry.     Findings: No erythema or rash.  Neurological:     Mental Status: He is alert and oriented to person, place, and time.     Cranial Nerves: No cranial nerve deficit.     Deep Tendon Reflexes: Reflexes are normal and symmetric.  Psychiatric:        Behavior: Behavior normal.        Thought Content: Thought content normal.        Judgment: Judgment normal.     BP (!) 153/77   Pulse 71   Temp 98.7 F (37.1 C) (Temporal)   Wt 145 lb 9.6 oz (66 kg)   SpO2 96%   BMI 24.99 kg/m        Assessment & Plan:  [name] comes in today with chief complaint of Foot Pain (Right and wrose and ) and Ankle Pain   Diagnosis and orders addressed:  1. Acute right ankle pain - predniSONE (DELTASONE) 20 MG tablet; Take 2 tablets (40 mg total) by mouth daily with breakfast for 5  days.  Dispense: 10 tablet; Refill: 0  2. Alcohol abuse, in remission  3. Paroxysmal atrial fibrillation (HCC)  4. Alcoholic peripheral neuropathy (HCC)  - pregabalin (LYRICA) 75 MG capsule; Take 1 capsule (75 mg total) by mouth 2 (two) times daily.  Dispense: 60 capsule; Refill: 2   Will increase Lyrica to 75 mg BID from 50 mg  Can not do NSAID's because of Eliquis Will give prednisone, is on patient allergy list but states this reaction happened while drinking. Will try Rest Ice  Keep chronic follow up   Jannifer Rodney, FNP

## 2023-02-10 NOTE — Telephone Encounter (Signed)
[name] (daughter) dropped off [employer] forms to be completed and signed.  Form Fee Paid? (Y/N)       yes     If NO, form is placed on front office manager desk to hold until payment received. If YES, then form will be placed in the RX/HH Nurse Coordinators box for completion.  Form will not be processed until payment is received

## 2023-02-10 NOTE — Telephone Encounter (Signed)
Form Fee paid $20 Will place in RX/HH Nurse Coordinators box for completion

## 2023-02-10 NOTE — Patient Instructions (Signed)
Peripheral Neuropathy Peripheral neuropathy is a type of nerve damage. It affects nerves that carry signals between the spinal cord and the arms, legs, and the rest of the body (peripheral nerves). It does not affect nerves in the spinal cord or brain. In peripheral neuropathy, one nerve or a group of nerves may be damaged. Peripheral neuropathy is a broad category that includes many specific nerve disorders, like diabetic neuropathy, hereditary neuropathy, and carpal tunnel syndrome. What are the causes? This condition may be caused by: Certain diseases, such as: Diabetes. This is the most common cause of peripheral neuropathy. Autoimmune diseases, such as rheumatoid arthritis and systemic lupus erythematosus. Nerve diseases that are passed from parent to child (inherited). Kidney disease. Thyroid disease. Other causes may include: Nerve injury. Pressure or stress on a nerve that lasts a long time. Lack (deficiency) of B vitamins. This can result from alcoholism, poor diet, or a restricted diet. Infections. Some medicines, such as cancer medicines (chemotherapy). Poisonous (toxic) substances, such as lead and mercury. Too little blood flowing to the legs. In some cases, the cause of this condition is not known. What are the signs or symptoms? Symptoms of this condition depend on which of your nerves is damaged. Symptoms in the legs, hands, and arms can include: Loss of feeling (numbness) in the feet, hands, or both. Tingling in the feet, hands, or both. Burning pain. Very sensitive skin. Weakness. Not being able to move a part of the body (paralysis). Clumsiness or poor coordination. Muscle twitching. Loss of balance. Symptoms in other parts of the body can include: Not being able to control your bladder. Feeling dizzy. Sexual problems. How is this diagnosed? Diagnosing and finding the cause of peripheral neuropathy can be difficult. Your health care provider will take your  medical history and do a physical exam. A neurological exam will also be done. This involves checking things that are affected by your brain, spinal cord, and nerves (nervous system). For example, your health care provider will check your reflexes, how you move, and what you can feel. You may have other tests, such as: Blood tests. Electromyogram (EMG) and nerve conduction tests. These tests check nerve function and how well the nerves are controlling the muscles. Imaging tests, such as a CT scan or MRI, to rule out other causes of your symptoms. Removing a small piece of nerve to be examined in a lab (nerve biopsy). Removing and examining a small amount of the fluid that surrounds the brain and spinal cord (lumbar puncture). How is this treated? Treatment for this condition may involve: Treating the underlying cause of the neuropathy, such as diabetes, kidney disease, or vitamin deficiencies. Stopping medicines that can cause neuropathy, such as chemotherapy. Medicine to help relieve pain. Medicines may include: Prescription or over-the-counter pain medicine. Anti-seizure medicine. Antidepressants. Pain-relieving patches that are applied to painful areas of skin. Surgery to relieve pressure on a nerve or to destroy a nerve that is causing pain. Physical therapy to help improve movement and balance. Devices to help you move around (assistive devices). Follow these instructions at home: Medicines Take over-the-counter and prescription medicines only as told by your health care provider. Do not take any other medicines without first asking your health care provider. Ask your health care provider if the medicine prescribed to you requires you to avoid driving or using machinery. Lifestyle  Do not use any products that contain nicotine or tobacco. These products include cigarettes, chewing tobacco, and vaping devices, such as e-cigarettes. Smoking keeps  blood from reaching damaged nerves. If you  need help quitting, ask your health care provider. Avoid or limit alcohol. Too much alcohol can cause a vitamin B deficiency, and vitamin B is needed for healthy nerves. Eat a healthy diet. This includes: Eating foods that are high in fiber, such as beans, whole grains, and fresh fruits and vegetables. Limiting foods that are high in fat and processed sugars, such as fried or sweet foods. General instructions  If you have diabetes, work closely with your health care provider to keep your blood sugar under control. If you have numbness in your feet: Check every day for signs of injury or infection. Watch for redness, warmth, and swelling. Wear padded socks and comfortable shoes. These help protect your feet. Develop a good support system. Living with peripheral neuropathy can be stressful. Consider talking with a mental health specialist or joining a support group. Use assistive devices and attend physical therapy as told by your health care provider. This may include using a walker or a cane. Keep all follow-up visits. This is important. Where to find more information [employer] of Neurological Disorders: [employer] Contact a health care provider if: You have new signs or symptoms of peripheral neuropathy. You are struggling emotionally from dealing with peripheral neuropathy. Your pain is not well controlled. Get help right away if: You have an injury or infection that is not healing normally. You develop new weakness in an arm or leg. You have fallen or do so frequently. Summary Peripheral neuropathy is when the nerves in the arms or legs are damaged, resulting in numbness, weakness, or pain. There are many causes of peripheral neuropathy, including diabetes, pinched nerves, vitamin deficiencies, autoimmune disease, and hereditary conditions. Diagnosing and finding the cause of peripheral neuropathy can be difficult. Your health care provider will take your medical  history, do a physical exam, and do tests, including blood tests and nerve function tests. Treatment involves treating the underlying cause of the neuropathy and taking medicines to help control pain. Physical therapy and assistive devices may also help. This information is not intended to replace advice given to you by your health care provider. Make sure you discuss any questions you have with your health care provider. Document Revised: 02/09/2021 Document Reviewed: 02/09/2021 Elsevier Patient Education  2024 [employer].

## 2023-02-14 ENCOUNTER — Telehealth: Payer: Self-pay | Admitting: Physical Medicine and Rehabilitation

## 2023-02-14 NOTE — Telephone Encounter (Signed)
[name] from [facility] said that the patient said it hurts to do PT. CB#[phone]

## 2023-02-16 NOTE — Telephone Encounter (Signed)
Lvm for cb to find out more information as requested

## 2023-02-24 ENCOUNTER — Ambulatory Visit: Payer: Medicare Other | Attending: Cardiovascular Disease | Admitting: Cardiovascular Disease

## 2023-02-24 ENCOUNTER — Encounter: Payer: Self-pay | Admitting: Cardiovascular Disease

## 2023-02-24 ENCOUNTER — Telehealth: Payer: Self-pay

## 2023-02-24 VITALS — BP 108/82 | HR 70 | Ht 67.0 in | Wt 148.2 lb

## 2023-02-24 DIAGNOSIS — I5043 Acute on chronic combined systolic (congestive) and diastolic (congestive) heart failure: Secondary | ICD-10-CM

## 2023-02-24 LAB — CUP PACEART INCLINIC DEVICE CHECK
Date Time Interrogation Session: 20240906143233
HighPow Impedance: 54 Ohm
Implantable Lead Connection Status: 753985
Implantable Lead Connection Status: 753985
Implantable Lead Connection Status: 753985
Implantable Lead Implant Date: 20040312
Implantable Lead Implant Date: 20040312
Implantable Lead Implant Date: 20040312
Implantable Lead Location: 753858
Implantable Lead Location: 753859
Implantable Lead Location: 753860
Implantable Lead Model: 158
Implantable Lead Model: 4087
Implantable Lead Model: 4513
Implantable Lead Serial Number: 129158
Implantable Lead Serial Number: 209446
Implantable Lead Serial Number: 407264
Implantable Pulse Generator Implant Date: 20150917
Lead Channel Impedance Value: 1109 Ohm
Lead Channel Impedance Value: 543 Ohm
Lead Channel Impedance Value: 621 Ohm
Lead Channel Pacing Threshold Amplitude: 0.9 V
Lead Channel Pacing Threshold Amplitude: 1.2 V
Lead Channel Pacing Threshold Amplitude: 2.3 V
Lead Channel Pacing Threshold Pulse Width: 0.4 ms
Lead Channel Pacing Threshold Pulse Width: 0.4 ms
Lead Channel Pacing Threshold Pulse Width: 0.8 ms
Lead Channel Sensing Intrinsic Amplitude: 0.6 mV
Lead Channel Setting Pacing Amplitude: 2 V
Lead Channel Setting Pacing Amplitude: 2.5 V
Lead Channel Setting Pacing Amplitude: 3.3 V
Lead Channel Setting Pacing Pulse Width: 0.4 ms
Lead Channel Setting Pacing Pulse Width: 0.8 ms
Lead Channel Setting Sensing Sensitivity: 0.6 mV
Lead Channel Setting Sensing Sensitivity: 1 mV
Pulse Gen Serial Number: 106298

## 2023-02-24 NOTE — Telephone Encounter (Signed)
Alert received from [employer] [facility]:  Device alert Cardiac Resynchronization Therapy pacing of < 85%. Pacing was 83% between Feb 22, 2023 23:26 and Feb 23, 2023 23:25.  Currently 96% RR has increased per trends - route to triage  Contacted Pt:

## 2023-02-24 NOTE — Telephone Encounter (Signed)
Patient in today for routine appointment with Dr. Nelly Laurence in [place].    Normal Device Function. Now BiVP 96%. High AF burden. Noted elevated resp rate on 9/5.  Patient is doing well, asymptomatic.  No concerns.  Will continue to monitor.    See PACEART and Dr. Zoe Lan note for full encounter details.

## 2023-02-24 NOTE — Patient Instructions (Addendum)
Medication Instructions:  Continue all current medications.   Labwork: none  Testing/Procedures: none  Follow-Up: 6 months   Any Other Special Instructions Will Be Listed Below (If Applicable).   If you need a refill on your cardiac medications before your next appointment, please call your pharmacy.  

## 2023-02-24 NOTE — Progress Notes (Signed)
   PCP: Junie Spencer, FNP Primary Cardiologist: Dr Antoine Poche Primary EP: Dr Daphine Deutscher is a 82 y.o. male who presents today for routine electrophysiology followup.  Since last being seen in our clinic, the patient reports doing very well.       Today, he denies symptoms of palpitations, chest pain, shortness of breath,  lower extremity edema, dizziness, presyncope, syncope, or ICD shocks.  he has no device related complaints -- no new tenderness, drainage, redness. The patient is otherwise without complaint today.       Physical Exam: Vitals:   02/24/23 1119  BP: 108/82  Pulse: 70  SpO2: 99%  Weight: 148 lb 3.2 oz (67.2 kg)  Height: 5\' 7"  (1.702 m)    Gen: Appears comfortable, well-nourished CV: RRR, no dependent edema The device site is normal -- no tenderness, edema, drainage, redness, threatened erosion. Pulm: breathing easily   ICD interrogation- reviewed in detail today,  See PACEART report    Wt Readings from Last 3 Encounters:  02/24/23 148 lb 3.2 oz (67.2 kg)  02/10/23 145 lb 9.6 oz (66 kg)  01/23/23 143 lb (64.9 kg)   Ecg today show atrial fibrillation with BiV pacing  Assessment and Plan:  1.  Chronic systolic dysfunction/ nonischemic CM euvolemic today EF recovered with CRT Stable on an appropriate medical regimen Normal BiV ICD function See Pace Art report No changes today he is not device dependant today  2. VT Previously noted Current well controlled; only one 5 beat V run on interrogation today  3. HTN Stable No change required today  4. Paroxysmal atrial fibrillation I suspect his AF is persistent and undersensed Chads2vasc scoreis at least 5 on eliquis 5 -- will need to decrease dose to 2.5mg  BID at age 36     Maurice Small, MD 02/24/2023 11:48 AM

## 2023-02-26 NOTE — Progress Notes (Unsigned)
Cardiology Office Note:   Date:  02/26/2023  ID:  [name], DOB [birth year], MRN [id number] PCP: Junie Spencer, FNP  [facility] Providers Cardiologist:  Rollene Rotunda, MD Electrophysiologist:  Maurice Small, MD {  History of Present Illness:   [name] is a 82 y.o. male  who presents for follow up of non ischemic cardiomyopathy and atrial fib.  He had a stroke Dec 2023.  He had resolution of hemiparesis.  He was hospitalized at [facility] with volume overload.  He had an EF of 50 - 55% on echo.    There was moderate TR.  He was managed for acute on chronic diastolic HF.   He had AKI. ***  ***      At the last visit he had low BPs and increased edema.  I held his Norvasc and changed Lasix to Torsemide.  I saw him recently and increased his diuretic because he was having increased swelling and SOB.  Follow up creat was stable. He returns for follow up.     He has lost about 10 lbs by taking twice the dose of torsemide for about 5 days.  He is now back to his regular dose.    The patient denies any new symptoms such as chest discomfort, neck or arm discomfort. There has been no new shortness of breath, PND or orthopnea. There have been no reported palpitations, presyncope or syncope.      ROS: ***  Studies Reviewed:    EKG:       ***  Risk Assessment/Calculations:   {Does this patient have ATRIAL FIBRILLATION?:[phone]} No BP recorded.  {Refresh Note OR Click here to enter BP  :1}***        Physical Exam:   VS:  There were no vitals taken for this visit.   Wt Readings from Last 3 Encounters:  02/24/23 148 lb 3.2 oz (67.2 kg)  02/10/23 145 lb 9.6 oz (66 kg)  01/23/23 143 lb (64.9 kg)     GEN: Well nourished, well developed in no acute distress NECK: No JVD; No carotid bruits CARDIAC: ***RRR, no murmurs, rubs, gallops RESPIRATORY:  Clear to auscultation without rales, wheezing or rhonchi  ABDOMEN: Soft, non-tender,  non-distended EXTREMITIES:  No edema; No deformity   ASSESSMENT AND PLAN:   PAF:    Mr. [name] has a CHA2DS2 - VASc score of 4.   ***  He has paroxysmal atrial fibrillation but seems to tolerate this.  He tolerates anticoagulation.   CHRONIC SYSTOLIC AND DIASTOLIC HF:    ***  He is responding well to diuresis.  No change in therapy.  Action fraction 50%.  No change in therapy.  Of note his blood pressure actually is running low in the past so this is unusual.  His daughter will keep an eye on this and we can then titrate if his blood pressure actually starts to be elevated.   VENTRICULAR TACHYCARDIA:    He is up-to-date with device follow-up.  ***  No change in therapy.   TOBACCO ABUSE:  ***   I again told him about the need to stop smoking.  No change in therapy.   DYSLIPIDEMIA:   LDL was *** 68.  No change in therapy.   HTN: His blood pressure is ***   at target.  At target at home.Marland Kitchen  No change in therapy.   SLEEP APNEA:  ***  He is using BiPAP   ***    {  Are you ordering a CV Procedure (e.g. stress test, cath, DCCV, TEE, etc)?   Press F2        :[id number]}  Follow up ***  Signed, Rollene Rotunda, MD

## 2023-03-01 ENCOUNTER — Encounter: Payer: Self-pay | Admitting: Cardiology

## 2023-03-01 ENCOUNTER — Ambulatory Visit: Payer: Medicare Other | Admitting: Cardiology

## 2023-03-01 VITALS — BP 158/80 | HR 74 | Ht 67.0 in | Wt 145.0 lb

## 2023-03-01 DIAGNOSIS — E785 Hyperlipidemia, unspecified: Secondary | ICD-10-CM

## 2023-03-01 DIAGNOSIS — I480 Paroxysmal atrial fibrillation: Secondary | ICD-10-CM | POA: Diagnosis not present

## 2023-03-01 DIAGNOSIS — I472 Ventricular tachycardia, unspecified: Secondary | ICD-10-CM

## 2023-03-01 DIAGNOSIS — I5042 Chronic combined systolic (congestive) and diastolic (congestive) heart failure: Secondary | ICD-10-CM | POA: Diagnosis not present

## 2023-03-01 DIAGNOSIS — Z720 Tobacco use: Secondary | ICD-10-CM | POA: Diagnosis not present

## 2023-03-01 DIAGNOSIS — I10 Essential (primary) hypertension: Secondary | ICD-10-CM | POA: Diagnosis not present

## 2023-03-01 MED ORDER — AMLODIPINE BESYLATE 2.5 MG PO TABS: 2.5000 mg | ORAL_TABLET | Freq: Every day | ORAL | 3 refills | Status: DC

## 2023-03-01 NOTE — Patient Instructions (Signed)
Medication Instructions:  Please start Amlodipine 2.5 mg a day. Continue all other medications as listed.  *If you need a refill on your cardiac medications before your next appointment, please call your pharmacy*  Follow-Up: At [facility], you and your health needs are our priority.  As part of our continuing mission to provide you with exceptional heart care, we have created designated Provider Care Teams.  These Care Teams include your primary Cardiologist (physician) and Advanced Practice Providers (APPs -  Physician Assistants and Nurse Practitioners) who all work together to provide you with the care you need, when you need it.  We recommend signing up for the patient portal called "MyChart".  Sign up information is provided on this After Visit Summary.  MyChart is used to connect with patients for Virtual Visits (Telemedicine).  Patients are able to view lab/test results, encounter notes, upcoming appointments, etc.  Non-urgent messages can be sent to your provider as well.   To learn more about what you can do with MyChart, go to [email or web address].    Your next appointment:   6 month(s)  Provider:   Rollene Rotunda, MD

## 2023-03-07 DIAGNOSIS — Z85828 Personal history of other malignant neoplasm of skin: Secondary | ICD-10-CM | POA: Diagnosis not present

## 2023-03-07 DIAGNOSIS — L570 Actinic keratosis: Secondary | ICD-10-CM | POA: Diagnosis not present

## 2023-03-07 DIAGNOSIS — X32XXXD Exposure to sunlight, subsequent encounter: Secondary | ICD-10-CM | POA: Diagnosis not present

## 2023-03-07 DIAGNOSIS — Z08 Encounter for follow-up examination after completed treatment for malignant neoplasm: Secondary | ICD-10-CM | POA: Diagnosis not present

## 2023-03-24 ENCOUNTER — Ambulatory Visit (INDEPENDENT_AMBULATORY_CARE_PROVIDER_SITE_OTHER): Payer: Medicare Other | Admitting: Family

## 2023-03-24 ENCOUNTER — Encounter: Payer: Self-pay | Admitting: Family

## 2023-03-24 ENCOUNTER — Telehealth: Payer: Self-pay | Admitting: Family

## 2023-03-24 VITALS — BP 135/76 | HR 68 | Temp 97.3°F | Ht 67.0 in | Wt 143.4 lb

## 2023-03-24 DIAGNOSIS — I5043 Acute on chronic combined systolic (congestive) and diastolic (congestive) heart failure: Secondary | ICD-10-CM

## 2023-03-24 DIAGNOSIS — J432 Centrilobular emphysema: Secondary | ICD-10-CM

## 2023-03-24 DIAGNOSIS — F1011 Alcohol abuse, in remission: Secondary | ICD-10-CM

## 2023-03-24 DIAGNOSIS — F411 Generalized anxiety disorder: Secondary | ICD-10-CM | POA: Diagnosis not present

## 2023-03-24 DIAGNOSIS — I700 Atherosclerosis of aorta: Secondary | ICD-10-CM

## 2023-03-24 DIAGNOSIS — K219 Gastro-esophageal reflux disease without esophagitis: Secondary | ICD-10-CM | POA: Diagnosis not present

## 2023-03-24 DIAGNOSIS — R350 Frequency of micturition: Secondary | ICD-10-CM

## 2023-03-24 DIAGNOSIS — I10 Essential (primary) hypertension: Secondary | ICD-10-CM

## 2023-03-24 DIAGNOSIS — Z79899 Other long term (current) drug therapy: Secondary | ICD-10-CM | POA: Diagnosis not present

## 2023-03-24 DIAGNOSIS — G621 Alcoholic polyneuropathy: Secondary | ICD-10-CM

## 2023-03-24 DIAGNOSIS — F1320 Sedative, hypnotic or anxiolytic dependence, uncomplicated: Secondary | ICD-10-CM

## 2023-03-24 DIAGNOSIS — N401 Enlarged prostate with lower urinary tract symptoms: Secondary | ICD-10-CM

## 2023-03-24 MED ORDER — ALPRAZOLAM 1 MG PO TABS
ORAL_TABLET | ORAL | 2 refills | Status: DC
Start: 2023-03-24 — End: 2023-06-26

## 2023-03-24 NOTE — Progress Notes (Signed)
Subjective:    Patient ID: [name], male    DOB: 08/06/1940, 82 y.o.   MRN: [id number]  Chief Complaint  Patient presents with   Medical Management of Chronic Issues   Pt presents to the office today for  chronic follow up. PT is followed by Cardiologists for CAD, CHF, Atherosclerosis, Cardiomyopathy, and Ventricular tachycardia every 2 years.  PT has pacemaker and is followed annually for this.     Pt is followed by Dermatologists for psoriasis as needed.     Has OSA and uses CPAP nightly.    He  reports he quit drinking 05/13/22. He reports constant burning and aching pain of bilateral feet from alocholic peripheral neuropathy that is 7 out 10. The Lyrica 75 mg BID has greatly helped.    He has aortic atheroscleroses and takes pravastatin daily.   He had a CVA in 05/13/22, with mild left sided weakness. He is doing PT. This has greatly improved.    He has emphysema. Smoking less 1/2 a pack a day. Denies any SOB.   He has taken his demadex 20 mg daily.     03/24/2023   12:04 PM 03/01/2023    3:10 PM 02/24/2023   11:19 AM  Last 3 Weights  Weight (lbs) 143 lb 6.4 oz 145 lb 148 lb 3.2 oz  Weight (kg) 65.046 kg 65.772 kg 67.223 kg      Hypertension This is a chronic problem. The current episode started more than 1 year ago. The problem has been waxing and waning since onset. The problem is uncontrolled. Associated symptoms include malaise/fatigue and shortness of breath. Pertinent negatives include no peripheral edema. Risk factors for coronary artery disease include dyslipidemia, male gender, sedentary lifestyle and smoking/tobacco exposure. The current treatment provides moderate improvement.  Congestive Heart Failure Presents for follow-up visit. Associated symptoms include fatigue, nocturia (1) and shortness of breath. Pertinent negatives include no edema.  Gastroesophageal Reflux He complains of belching and heartburn. This is a chronic problem. The current episode  started more than 1 year ago. The problem occurs occasionally. Associated symptoms include fatigue. He has tried a PPI for the symptoms. The treatment provided moderate relief.  Benign Prostatic Hypertrophy This is a chronic problem. The current episode started more than 1 year ago. Irritative symptoms include nocturia (1). Obstructive symptoms include an intermittent stream, straining and a weak stream. Past treatments include tamsulosin. The treatment provided moderate relief.  Nicotine Dependence Presents for follow-up visit. Symptoms include fatigue. His urge triggers include company of smokers. The symptoms have been stable. He smokes < 1/2 a pack of cigarettes per day.      Review of Systems  Constitutional:  Positive for fatigue and malaise/fatigue.  Respiratory:  Positive for shortness of breath.   Gastrointestinal:  Positive for heartburn.  Genitourinary:  Positive for nocturia (1).  All other systems reviewed and are negative.      Objective:   Physical Exam Vitals reviewed.  Constitutional:      General: He is not in acute distress.    Appearance: He is well-developed.  HENT:     Head: Normocephalic.     Right Ear: Tympanic membrane normal.     Left Ear: Tympanic membrane normal.  Eyes:     General:        Right eye: No discharge.        Left eye: No discharge.     Pupils: Pupils are equal, round, and reactive to light.  Neck:  Thyroid: No thyromegaly.  Cardiovascular:     Rate and Rhythm: Normal rate and regular rhythm.     Heart sounds: Normal heart sounds. No murmur heard. Pulmonary:     Effort: Pulmonary effort is normal. No respiratory distress.     Breath sounds: Normal breath sounds. No wheezing.  Abdominal:     General: Bowel sounds are normal. There is no distension.     Palpations: Abdomen is soft.     Tenderness: There is no abdominal tenderness.  Musculoskeletal:        General: No tenderness. Normal range of motion.     Cervical back: Normal  range of motion and neck supple.  Skin:    General: Skin is warm and dry.     Coloration: Skin is pale.     Findings: No erythema or rash.  Neurological:     Mental Status: He is alert and oriented to person, place, and time.     Cranial Nerves: No cranial nerve deficit.     Motor: Weakness present.     Gait: Gait abnormal (using cane).     Deep Tendon Reflexes: Reflexes are normal and symmetric.  Psychiatric:        Behavior: Behavior normal.        Thought Content: Thought content normal.        Judgment: Judgment normal.       BP 135/76   Pulse 68   Temp (!) 97.3 F (36.3 C) (Temporal)   Ht 5\' 7"  (1.702 m)   Wt 143 lb 6.4 oz (65 kg)   SpO2 98%   BMI 22.46 kg/m      Assessment & Plan:  [name] comes in today with chief complaint of Medical Management of Chronic Issues   Diagnosis and orders addressed:  1. GAD (generalized anxiety disorder) - ALPRAZolam (XANAX) 1 MG tablet; TAKE 1 TABLET BY MOUTH TWICE DAILY . APPOINTMENT REQUIRED FOR FUTURE REFILLS  Dispense: 60 tablet; Refill: 2 - CMP14+EGFR - CBC with Differential/Platelet  2. Acute on chronic combined systolic and diastolic HF (heart failure) (HCC) - CMP14+EGFR - CBC with Differential/Platelet  3. Alcohol abuse, in remission - CMP14+EGFR - CBC with Differential/Platelet  4. Alcoholic peripheral neuropathy (HCC) - CMP14+EGFR - CBC with Differential/Platelet  5. Aortic atherosclerosis (HCC) - CMP14+EGFR - CBC with Differential/Platelet  6. Benign prostatic hyperplasia with urinary frequency - CMP14+EGFR - CBC with Differential/Platelet  7. Benzodiazepine dependence (HCC)  - CMP14+EGFR - CBC with Differential/Platelet  8. Benign prostatic hyperplasia (BPH) with straining on urination - CMP14+EGFR - CBC with Differential/Platelet  9. Centrilobular emphysema (HCC) - CMP14+EGFR - CBC with Differential/Platelet  10. Essential hypertension - CMP14+EGFR - CBC with  Differential/Platelet  11. Gastroesophageal reflux disease, unspecified whether esophagitis present - CMP14+EGFR - CBC with Differential/Platelet  12. Controlled substance agreement signed - CMP14+EGFR - CBC with Differential/Platelet   Labs pending Continue current medications  Patient reviewed in [place] controlled database, no flags noted. Contract and drug screen are up to date.  Health Maintenance reviewed Diet and exercise encouraged  Follow up plan: 3 months   Jannifer Rodney, FNP

## 2023-03-24 NOTE — Patient Instructions (Signed)

## 2023-03-25 LAB — CMP14+EGFR
ALT: 18 [IU]/L (ref 0–44)
AST: 34 [IU]/L (ref 0–40)
Albumin: 4.3 g/dL (ref 3.8–4.8)
Alkaline Phosphatase: 112 [IU]/L (ref 44–121)
BUN/Creatinine Ratio: 19 (ref 10–24)
BUN: 30 mg/dL — ABNORMAL HIGH (ref 8–27)
Bilirubin Total: 0.4 mg/dL (ref 0.0–1.2)
CO2: 23 mmol/L (ref 20–29)
Calcium: 9.6 mg/dL (ref 8.6–10.2)
Chloride: 98 mmol/L (ref 96–106)
Creatinine, Ser: 1.57 mg/dL — ABNORMAL HIGH (ref 0.76–1.27)
Globulin, Total: 3.6 g/dL (ref 1.5–4.5)
Glucose: 88 mg/dL (ref 70–99)
Potassium: 4.2 mmol/L (ref 3.5–5.2)
Sodium: 140 mmol/L (ref 134–144)
Total Protein: 7.9 g/dL (ref 6.0–8.5)
eGFR: 45 mL/min/{1.73_m2} — ABNORMAL LOW (ref >59)

## 2023-03-25 LAB — CBC WITH DIFFERENTIAL/PLATELET
Basophils Absolute: 0.1 10*3/uL (ref 0.0–0.2)
Basos: 1 %
EOS (ABSOLUTE): 0.3 10*3/uL (ref 0.0–0.4)
Eos: 3 %
Hematocrit: 39.1 % (ref 37.5–51.0)
Hemoglobin: 12.7 g/dL — ABNORMAL LOW (ref 13.0–17.7)
Immature Grans (Abs): 0 10*3/uL (ref 0.0–0.1)
Immature Granulocytes: 0 %
Lymphocytes Absolute: 2.6 10*3/uL (ref 0.7–3.1)
Lymphs: 34 %
MCH: 31.3 pg (ref 26.6–33.0)
MCHC: 32.5 g/dL (ref 31.5–35.7)
MCV: 96 fL (ref 79–97)
Monocytes Absolute: 1 10*3/uL — ABNORMAL HIGH (ref 0.1–0.9)
Monocytes: 13 %
Neutrophils Absolute: 3.8 10*3/uL (ref 1.4–7.0)
Neutrophils: 49 %
Platelets: 197 10*3/uL (ref 150–450)
RBC: 4.06 x10E6/uL — ABNORMAL LOW (ref 4.14–5.80)
RDW: 12.7 % (ref 11.6–15.4)
WBC: 7.8 10*3/uL (ref 3.4–10.8)

## 2023-03-27 ENCOUNTER — Other Ambulatory Visit: Payer: Self-pay | Admitting: Family

## 2023-03-27 NOTE — Telephone Encounter (Signed)
Aware form ready for pickup

## 2023-03-27 NOTE — Telephone Encounter (Signed)
Form on provider's desk.

## 2023-04-17 ENCOUNTER — Other Ambulatory Visit: Payer: Self-pay | Admitting: Family

## 2023-04-25 ENCOUNTER — Ambulatory Visit (INDEPENDENT_AMBULATORY_CARE_PROVIDER_SITE_OTHER): Payer: Medicare Other

## 2023-04-25 DIAGNOSIS — I472 Ventricular tachycardia, unspecified: Secondary | ICD-10-CM

## 2023-04-25 DIAGNOSIS — I428 Other cardiomyopathies: Secondary | ICD-10-CM

## 2023-05-01 ENCOUNTER — Ambulatory Visit (INDEPENDENT_AMBULATORY_CARE_PROVIDER_SITE_OTHER): Payer: Medicare Other

## 2023-05-01 DIAGNOSIS — I428 Other cardiomyopathies: Secondary | ICD-10-CM

## 2023-05-01 DIAGNOSIS — I5022 Chronic systolic (congestive) heart failure: Secondary | ICD-10-CM

## 2023-05-01 LAB — CUP PACEART REMOTE DEVICE CHECK
Battery Remaining Longevity: 7 mo
Battery Remaining Percentage: 10 %
Brady Statistic RA Percent Paced: 0 %
Brady Statistic RV Percent Paced: 97 %
Date Time Interrogation Session: 20241111005600
HighPow Impedance: 61 Ohm
Implantable Lead Connection Status: 753985
Implantable Lead Connection Status: 753985
Implantable Lead Connection Status: 753985
Implantable Lead Implant Date: 20040312
Implantable Lead Implant Date: 20040312
Implantable Lead Implant Date: 20040312
Implantable Lead Location: 753858
Implantable Lead Location: 753859
Implantable Lead Location: 753860
Implantable Lead Model: 158
Implantable Lead Model: 4087
Implantable Lead Model: 4513
Implantable Lead Serial Number: 129158
Implantable Lead Serial Number: 209446
Implantable Lead Serial Number: 407264
Implantable Pulse Generator Implant Date: 20150917
Lead Channel Impedance Value: 1207 Ohm
Lead Channel Impedance Value: 600 Ohm
Lead Channel Impedance Value: 662 Ohm
Lead Channel Pacing Threshold Amplitude: 0.9 V
Lead Channel Pacing Threshold Amplitude: 1.2 V
Lead Channel Pacing Threshold Amplitude: 2.3 V
Lead Channel Pacing Threshold Pulse Width: 0.4 ms
Lead Channel Pacing Threshold Pulse Width: 0.4 ms
Lead Channel Pacing Threshold Pulse Width: 0.8 ms
Lead Channel Setting Pacing Amplitude: 2 V
Lead Channel Setting Pacing Amplitude: 2.5 V
Lead Channel Setting Pacing Amplitude: 3.3 V
Lead Channel Setting Pacing Pulse Width: 0.4 ms
Lead Channel Setting Pacing Pulse Width: 0.8 ms
Lead Channel Setting Sensing Sensitivity: 0.6 mV
Lead Channel Setting Sensing Sensitivity: 1 mV
Pulse Gen Serial Number: 106298

## 2023-05-22 DIAGNOSIS — R32 Unspecified urinary incontinence: Secondary | ICD-10-CM

## 2023-05-23 ENCOUNTER — Ambulatory Visit: Payer: Medicare Other | Admitting: Nurse Practitioner

## 2023-05-23 NOTE — Progress Notes (Signed)
Remote ICD transmission.   

## 2023-05-29 NOTE — Progress Notes (Signed)
Remote ICD transmission.   

## 2023-06-06 ENCOUNTER — Other Ambulatory Visit: Payer: Self-pay | Admitting: Family

## 2023-06-06 DIAGNOSIS — I10 Essential (primary) hypertension: Secondary | ICD-10-CM

## 2023-06-09 ENCOUNTER — Ambulatory Visit: Payer: Medicare Other | Admitting: Urology

## 2023-06-09 NOTE — Progress Notes (Deleted)
Name: [name] DOB: [birth year] MRN: [id number]  History of Present Illness: Mr. [name] is a 82 y.o. male who presents today as a new patient at [facility]. All available relevant medical records have been reviewed.  ***He is accompanied by ***. - GU History: 1. BPH with LUTS (frequency). 2. Renal cysts.  Per chart review: > 07/11/2022: CT abdomen/pelvis w/ contrast: - prostatomegaly; bladder markedly distended - no GU stones or hydronephrosis - "1.3 cm hypoattenuating lesion within the midpole of the right kidney, with internal contents measuring more than simple fluid attenuation. Additional subcentimeter hypoattenuating lesions within the left kidney, too small to adequately characterize."  ***Likely BOO per CT from 07/11/2022  Today: He reports chief complaint of urinary incontinence.  He {Actions; denies-reports:120008} urge incontinence. He {Actions; denies-reports:120008} stress incontinence with ***cough/***laugh/***sneeze/***heavy lifting /***exercise. He reports the ***SUI / ***UUI is predominant.  He leaks *** times per ***. Wears *** ***pads/ ***diapers per day. He reports urinary incontinence {ACTION; IS/IS [id number]} significantly bothersome.   He reports urinary ***frequency, ***nocturia, and ***urgency. Voiding ***x/day and ***x/night on average.   He {Actions; denies-reports:120008} prior attempted treatment for these symptoms ***including ***  He {Actions; denies-reports:120008} caffeine intake (*** caffeinated beverages per day on average).  He {Actions; denies-reports:120008} history of obstructive sleep apnea; {Actions; denies-reports:120008} wearing CPAP routinely.  ***He denies ever having a sleep study before. He {Actions; denies-reports:120008} fluid intake within 3 hours prior to bedtime. He {Actions; denies-reports:120008} fluid intake during the night.  He {Actions; denies-reports:120008} caffeine intake within 8 hours  prior to bedtime. He {Actions; denies-reports:120008} routinely experiencing lower extremity edema during the day. ***He {Actions; denies-reports:120008} elevating feet during the day and/or wearing compression socks when lower extremity edema is present during the day. ***He {Actions; denies-reports:120008} monitoring dietary salt and sodium intake.  He {Actions; denies-reports:120008} dysuria, gross hematuria, straining to void, or sensations of incomplete emptying.   Fall Screening: Do you usually have a device to assist in your mobility? {yes/no:20286} ***cane / ***walker / ***wheelchair  Medications: Current Outpatient Medications  Medication Sig Dispense Refill   albuterol (VENTOLIN HFA) 108 (90 Base) MCG/ACT inhaler INHALE 1 PUFF BY MOUTH EVERY 6 HOURS AS NEEDED FOR SHORTNESS OF BREATH 9 g 2   ALPRAZolam (XANAX) 1 MG tablet TAKE 1 TABLET BY MOUTH TWICE DAILY . APPOINTMENT REQUIRED FOR FUTURE REFILLS 60 tablet 2   amLODipine (NORVASC) 2.5 MG tablet Take 1 tablet (2.5 mg total) by mouth daily. 90 tablet 3   apixaban (ELIQUIS) 5 MG TABS tablet Take 1 tablet by mouth twice daily 60 tablet 5   cetirizine (ZYRTEC) 10 MG tablet Take 10 mg by mouth daily as needed for allergies.     ferrous sulfate 324 MG TBEC Take 324 mg by mouth.     linaclotide (LINZESS) 72 MCG capsule Take 1 capsule (72 mcg total) by mouth daily before breakfast. 90 capsule 1   lisinopril (ZESTRIL) 40 MG tablet Take 1 tablet by mouth once daily 90 tablet 0   metoprolol tartrate (LOPRESSOR) 100 MG tablet Take 1 tablet by mouth twice daily 180 tablet 0   Multiple Vitamin (MULTIVITAMIN) capsule Take 1 capsule by mouth daily.     pantoprazole (PROTONIX) 40 MG tablet Take 1 tablet (40 mg total) by mouth daily. 90 tablet 1   pravastatin (PRAVACHOL) 80 MG tablet Take 1 tablet (80 mg total) by mouth every evening. 90 tablet 3   pregabalin (LYRICA) 75 MG capsule Take 1 capsule (75 mg total) by  mouth 2 (two) times daily. 60  capsule 2   tamsulosin (FLOMAX) 0.4 MG CAPS capsule Take 1 capsule (0.4 mg total) by mouth daily. 90 capsule 0   torsemide (DEMADEX) 20 MG tablet Take 2 tablets (40 mg total) by mouth daily. (Patient taking differently: Take 20 mg by mouth daily.) 180 tablet 3   No current facility-administered medications for this visit.    Allergies: Allergies  Allergen Reactions   Gabapentin Anaphylaxis   Sudafed [Pseudoephedrine Hcl] Other (See Comments)    Unknown   Prednisone Other (See Comments)    Depression and "personality changes".   Avelox [Moxifloxacin Hcl In Nacl] Other (See Comments)    Unknown     Past Medical History:  Diagnosis Date   Anxiety    Arthritis    CAD (coronary artery disease)    Nonobstructive cath 2012.  PCI to RCA 2003   Carotid artery disease (HCC)    Cataract    CHF (congestive heart failure) (HCC)    COPD (chronic obstructive pulmonary disease) (HCC)    Depression    Dyslipidemia    EtOH dependence (HCC)    GERD (gastroesophageal reflux disease)    Hyperlipidemia    Hypertension    Insomnia    Nonischemic cardiomyopathy (HCC)    EF was 20% (60% last echo 2015)   Pacemaker    and defibrillator   PAF (paroxysmal atrial fibrillation) (HCC)    Peptic ulcer disease    Tobacco abuse    Past Surgical History:  Procedure Laterality Date   BIV ICD GENERTAOR CHANGE OUT N/A 03/06/2014   [employer] Gen change by Dr Johney Frame Lianne Moris CRT-D with LV1 header)   biventricular defibrillator implantation     [employer] and pacemaker   CATARACT EXTRACTION W/PHACO Right 12/10/2015   Procedure: CATARACT EXTRACTION PHACO AND INTRAOCULAR LENS PLACEMENT RIGHT EYE; CDE: 19.30;  Surgeon: Gemma Payor, MD;  Location: [facility];  Service: Ophthalmology;  Laterality: Right;   CATARACT EXTRACTION W/PHACO Left 01/14/2016   Procedure: CATARACT EXTRACTION PHACO AND INTRAOCULAR LENS PLACEMENT LEFT EYE; CDE:  8.03;  Surgeon: Gemma Payor, MD;  Location: [facility];  Service:  Ophthalmology;  Laterality: Left;   COLONOSCOPY     EYE SURGERY     Ulcer surgery     repair of stomach ulcer   Family History  Problem Relation Age of Onset   Heart failure Father    Colon polyps Sister    GI Bleed Sister    GI Bleed Brother    Heart attack Brother    Psoriasis Daughter    Arthritis/Rheumatoid Daughter    Cancer Brother    Heart disease Brother    Heart disease Brother    Colon cancer Neg Hx    Esophageal cancer Neg Hx    Rectal cancer Neg Hx    Stomach cancer Neg Hx    Social History   Socioeconomic History   Marital status: Divorced    Spouse name: Not on file   Number of children: 4   Years of education: Some college   Highest education level: 8th grade  Occupational History   Occupation: Retired    Associate Professor: [employer]  Tobacco Use   Smoking status: Some Days    Current packs/day: 0.25    Average packs/day: 0.3 packs/day for 45.0 years (11.3 ttl pk-yrs)    Types: Cigarettes   Smokeless tobacco: Former    Types: Chew    Quit date: 06/20/1989   Tobacco comments:  he is not interested in cessation    Patient started back smoking about 3 Cigarettes a daily  Vaping Use   Vaping status: Never Used  Substance and Sexual Activity   Alcohol use: Yes    Alcohol/week: 6.0 standard drinks of alcohol    Types: 6 Cans of beer per week    Comment: drings 4-6 beers per day   Drug use: No    Types: Marijuana    Comment: last used marijuana yesterday   Sexual activity: Not Currently    Birth control/protection: None  Other Topics Concern   Not on file  Social History Narrative   Divorced 2 sons 2 daughters   Is retired from Administrator, sports, he was a Psychologist, occupational   3 alcoholic beverages daily no caffeine no drugs he still smokes no other tobacco or drug use   Social Drivers of Corporate investment banker Strain: Low Risk  (12/20/2022)   Overall Financial Resource Strain (CARDIA)    Difficulty of Paying Living Expenses: Not hard at all   Food Insecurity: No Food Insecurity (12/20/2022)   Hunger Vital Sign    Worried About Running Out of Food in the Last Year: Never true    Ran Out of Food in the Last Year: Never true  Transportation Needs: No Transportation Needs (12/20/2022)   PRAPARE - Administrator, Civil Service (Medical): No    Lack of Transportation (Non-Medical): No  Physical Activity: Inactive (12/20/2022)   Exercise Vital Sign    Days of Exercise per Week: 0 days    Minutes of Exercise per Session: 30 min  Stress: No Stress Concern Present (12/20/2022)   [employer] of Occupational Health - Occupational Stress Questionnaire    Feeling of Stress : Only a little  Social Connections: Socially Isolated (12/20/2022)   Social Connection and Isolation Panel [NHANES]    Frequency of Communication with Friends and Family: More than three times a week    Frequency of Social Gatherings with Friends and Family: More than three times a week    Attends Religious Services: Never    Database administrator or Organizations: No    Attends Banker Meetings: Never    Marital Status: Divorced  Catering manager Violence: Not At Risk (11/11/2022)   Humiliation, Afraid, Rape, and Kick questionnaire    Fear of Current or Ex-Partner: No    Emotionally Abused: No    Physically Abused: No    Sexually Abused: No    SUBJECTIVE  Review of Systems Constitutional: Patient denies any unintentional weight loss or change in strength lntegumentary: Patient denies any rashes or pruritus Cardiovascular: Patient denies chest pain or syncope Respiratory: Patient denies shortness of breath Gastrointestinal: Patient ***denies nausea, vomiting, constipation, or diarrhea Musculoskeletal: Patient denies muscle cramps or weakness Neurologic: Patient denies convulsions or seizures Allergic/Immunologic: Patient denies recent allergic reaction(s) Hematologic/Lymphatic: Patient denies bleeding tendencies Endocrine: Patient  denies heat/cold intolerance  GU: As per HPI.  OBJECTIVE There were no vitals filed for this visit. There is no height or weight on file to calculate BMI.  Physical Examination Constitutional: No obvious distress; patient is non-toxic appearing  Cardiovascular: No visible lower extremity edema.  Respiratory: The patient does not have audible wheezing/stridor; respirations do not appear labored  Gastrointestinal: Abdomen non-distended Musculoskeletal: Normal ROM of UEs  Skin: No obvious rashes/open sores  Neurologic: CN 2-12 grossly intact Psychiatric: Answered questions appropriately with normal affect  Hematologic/Lymphatic/Immunologic: No obvious bruises or sites of  spontaneous bleeding  Urine microscopy: ***negative *** WBC/hpf, *** RBC/hpf, *** bacteria UA: ***negative *** WBC/hpf, *** RBC/hpf, *** bacteria ***with no evidence of UTI ***with no evidence of microscopic hematuria ***otherwise unremarkable  PVR: *** ml  ASSESSMENT No diagnosis found.  We discussed the different forms of urinary incontinence, such as stress and urge incontinence, and described how symptoms are consistent with mixed urinary incontinence.  1. For treatment of stress urinary incontinence: The etiology of this condition was explained in detail to include pelvic floor muscle relaxation and detachment of the urethra away from its connection to the pubic bone. Her risk profile was reviewed, including childbirth.  ***A visual demonstration of the pathophysiology of stress urinary incontinence was reviewed.  The management options were reviewed to include: No intervention, observation. Non-surgical options: Pelvic floor muscle rehabilitation Weight loss Incontinence pessary Surgical consultation  ***Options may include a midurethral sling (with synthetic mesh implant or autologous fascial sling), a Burch urethropexy, or transurethral injection of a bulking agent. Detailed discussion was had regarding  the potential risks of both procedures, including: mesh exposure, mesh erosion, failure to resolve SUI, ineffectiveness of these procedures for urge UI, dyspareunia, voiding dysfunction and/or retention.  She elected to proceed with ***.  *** Consider possibility of vaginal voiding if pt has recessed urethra and only leaks around 1 tsp at a time. Some urine can collect in vagina and leak upon standing after voiding. If this is suspected, advise them to lean forward while voiding and wipe inside vagina after voiding for 3 months. If symptoms persist, then move on to UDT for further evaluation.  2. For treatment of OAB with urinary frequency, nocturia, urgency, and urge incontinence: We discussed the symptoms of overactive bladder (OAB), which include urinary urgency, frequency, nocturia, with or without urge incontinence.   While we may not know the exact etiology of OAB, several risk factors can be identified.  - We discussed this patient's neurogenic risk factors for OAB-type symptoms including ***T2DM, ***nicotine use, ***.  - Likely exacerbated by ***diuretic use, ***caffeine intake.   We discussed the following management options in detail including potential benefits, risks, and side effects: Behavioral therapy: Modify fluid intake Decreasing bladder irritants (such as caffeine) Urge suppression strategies Bladder retraining / timed voiding Double voiding Medication(s): - For anticholinergic medications, we discussed the potential side effects of anticholinergics including dry eyes, dry mouth, constipation, cognitive impairment and urinary retention.  - For beta-3 agonist medication, we discussed the risk for urinary retention and the potential side effect of elevated blood pressure specific to Myrbetriq (which is more likely to occur in individuals with uncontrolled hypertension).  For refractory cases: PTNS (posterior tibial nerve stimulation) Sacral neuromodulation trial (Medtronic  lnterStim or Axonics implant) Bladder Botox injections In extreme cases, SP tube placement  He decided to proceed with *** ***work on behavioral modifications including ***minimizing caffeine intake and working on ***timed voiding / bladder retraining.   Will plan for follow up in ***8 weeks / *** months or sooner if needed. Pt verbalized understanding and agreement. All questions were answered.  PLAN Advised the following: *** ***. Minimize caffeine intake. ***. Work on timed voiding. ***. Urge suppression strategies. ***. Double/ triple voiding. ***. No follow-ups on file.  No orders of the defined types were placed in this encounter.   It has been explained that the patient is to follow regularly with their PCP in addition to all other providers involved in their care and to follow instructions provided by these respective  offices. Patient advised to contact urology clinic if any urologic-pertaining questions, concerns, new symptoms or problems arise in the interim period.  There are no Patient Instructions on file for this visit.  Electronically signed by:  Donnita Falls, MSN, FNP-C, CUNP 06/09/2023 4:28 PM

## 2023-06-23 ENCOUNTER — Ambulatory Visit: Payer: Medicare Other | Admitting: Urology

## 2023-06-23 DIAGNOSIS — N401 Enlarged prostate with lower urinary tract symptoms: Secondary | ICD-10-CM

## 2023-06-23 DIAGNOSIS — R32 Unspecified urinary incontinence: Secondary | ICD-10-CM

## 2023-06-26 ENCOUNTER — Telehealth (INDEPENDENT_AMBULATORY_CARE_PROVIDER_SITE_OTHER): Payer: Medicare Other | Admitting: Family

## 2023-06-26 ENCOUNTER — Encounter: Payer: Self-pay | Admitting: Family

## 2023-06-26 DIAGNOSIS — N401 Enlarged prostate with lower urinary tract symptoms: Secondary | ICD-10-CM

## 2023-06-26 DIAGNOSIS — I110 Hypertensive heart disease with heart failure: Secondary | ICD-10-CM

## 2023-06-26 DIAGNOSIS — J432 Centrilobular emphysema: Secondary | ICD-10-CM

## 2023-06-26 DIAGNOSIS — F17200 Nicotine dependence, unspecified, uncomplicated: Secondary | ICD-10-CM | POA: Diagnosis not present

## 2023-06-26 DIAGNOSIS — G4733 Obstructive sleep apnea (adult) (pediatric): Secondary | ICD-10-CM | POA: Diagnosis not present

## 2023-06-26 DIAGNOSIS — I6930 Unspecified sequelae of cerebral infarction: Secondary | ICD-10-CM | POA: Diagnosis not present

## 2023-06-26 DIAGNOSIS — G621 Alcoholic polyneuropathy: Secondary | ICD-10-CM

## 2023-06-26 DIAGNOSIS — I5022 Chronic systolic (congestive) heart failure: Secondary | ICD-10-CM

## 2023-06-26 DIAGNOSIS — I700 Atherosclerosis of aorta: Secondary | ICD-10-CM

## 2023-06-26 DIAGNOSIS — F1011 Alcohol abuse, in remission: Secondary | ICD-10-CM | POA: Diagnosis not present

## 2023-06-26 DIAGNOSIS — I10 Essential (primary) hypertension: Secondary | ICD-10-CM

## 2023-06-26 DIAGNOSIS — K219 Gastro-esophageal reflux disease without esophagitis: Secondary | ICD-10-CM

## 2023-06-26 DIAGNOSIS — Z79899 Other long term (current) drug therapy: Secondary | ICD-10-CM

## 2023-06-26 DIAGNOSIS — F411 Generalized anxiety disorder: Secondary | ICD-10-CM

## 2023-06-26 DIAGNOSIS — I25119 Atherosclerotic heart disease of native coronary artery with unspecified angina pectoris: Secondary | ICD-10-CM

## 2023-06-26 DIAGNOSIS — G4700 Insomnia, unspecified: Secondary | ICD-10-CM

## 2023-06-26 MED ORDER — TAMSULOSIN HCL 0.4 MG PO CAPS: 0.4000 mg | ORAL_CAPSULE | Freq: Every day | ORAL | 0 refills | Status: DC

## 2023-06-26 MED ORDER — ALPRAZOLAM 1 MG PO TABS: ORAL_TABLET | ORAL | 2 refills | Status: DC

## 2023-06-26 MED ORDER — LISINOPRIL 40 MG PO TABS: 40.0000 mg | ORAL_TABLET | Freq: Every day | ORAL | 0 refills | Status: DC

## 2023-06-26 NOTE — Patient Instructions (Signed)
 Health Maintenance After Age 81 After age 27, you are at a higher risk for certain long-term diseases and infections as well as injuries from falls. Falls are a major cause of broken bones and head injuries in people who are older than age 73. Getting regular preventive care can help to keep you healthy and well. Preventive care includes getting regular testing and making lifestyle changes as recommended by your health care provider. Talk with your health care provider about: Which screenings and tests you should have. A screening is a test that checks for a disease when you have no symptoms. A diet and exercise plan that is right for you. What should I know about screenings and tests to prevent falls? Screening and testing are the best ways to find a health problem early. Early diagnosis and treatment give you the best chance of managing medical conditions that are common after age 90. Certain conditions and lifestyle choices may make you more likely to have a fall. Your health care provider may recommend: Regular vision checks. Poor vision and conditions such as cataracts can make you more likely to have a fall. If you wear glasses, make sure to get your prescription updated if your vision changes. Medicine review. Work with your health care provider to regularly review all of the medicines you are taking, including over-the-counter medicines. Ask your health care provider about any side effects that may make you more likely to have a fall. Tell your health care provider if any medicines that you take make you feel dizzy or sleepy. Strength and balance checks. Your health care provider may recommend certain tests to check your strength and balance while standing, walking, or changing positions. Foot health exam. Foot pain and numbness, as well as not wearing proper footwear, can make you more likely to have a fall. Screenings, including: Osteoporosis screening. Osteoporosis is a condition that causes  the bones to get weaker and break more easily. Blood pressure screening. Blood pressure changes and medicines to control blood pressure can make you feel dizzy. Depression screening. You may be more likely to have a fall if you have a fear of falling, feel depressed, or feel unable to do activities that you used to do. Alcohol  use screening. Using too much alcohol  can affect your balance and may make you more likely to have a fall. Follow these instructions at home: Lifestyle Do not drink alcohol  if: Your health care provider tells you not to drink. If you drink alcohol : Limit how much you have to: 0-1 drink a day for women. 0-2 drinks a day for men. Know how much alcohol  is in your drink. In the [place] one drink equals one 12 oz bottle of beer (355 mL), one 5 oz glass of wine (148 mL), or one 1 oz glass of hard liquor (44 mL). Do not use any products that contain nicotine or tobacco. These products include cigarettes, chewing tobacco, and vaping devices, such as e-cigarettes. If you need help quitting, ask your health care provider. Activity  Follow a regular exercise program to stay fit. This will help you maintain your balance. Ask your health care provider what types of exercise are appropriate for you. If you need a cane or walker, use it as recommended by your health care provider. Wear supportive shoes that have nonskid soles. Safety  Remove any tripping hazards, such as rugs, cords, and clutter. Install safety equipment such as grab bars in bathrooms and safety rails on stairs. Keep rooms and walkways  well-lit. General instructions Talk with your health care provider about your risks for falling. Tell your health care provider if: You fall. Be sure to tell your health care provider about all falls, even ones that seem minor. You feel dizzy, tiredness (fatigue), or off-balance. Take over-the-counter and prescription medicines only as told by your health care provider. These include  supplements. Eat a healthy diet and maintain a healthy weight. A healthy diet includes low-fat dairy products, low-fat (lean) meats, and fiber from whole grains, beans, and lots of fruits and vegetables. Stay current with your vaccines. Schedule regular health, dental, and eye exams. Summary Having a healthy lifestyle and getting preventive care can help to protect your health and wellness after age 15. Screening and testing are the best way to find a health problem early and help you avoid having a fall. Early diagnosis and treatment give you the best chance for managing medical conditions that are more common for people who are older than age 42. Falls are a major cause of broken bones and head injuries in people who are older than age 64. Take precautions to prevent a fall at home. Work with your health care provider to learn what changes you can make to improve your health and wellness and to prevent falls. This information is not intended to replace advice given to you by your health care provider. Make sure you discuss any questions you have with your health care provider. Document Revised: 10/26/2020 Document Reviewed: 10/26/2020 Elsevier Patient Education  2024 [employer].

## 2023-06-26 NOTE — Progress Notes (Signed)
 Virtual Visit Consent   [name], you are scheduled for a virtual visit with a [facility] provider today. Just as with appointments in the office, your consent must be obtained to participate. Your consent will be active for this visit and any virtual visit you may have with one of our providers in the next 365 days. If you have a MyChart account, a copy of this consent can be sent to you electronically.  As this is a virtual visit, video technology does not allow for your provider to perform a traditional examination. This may limit your provider's ability to fully assess your condition. If your provider identifies any concerns that need to be evaluated in person or the need to arrange testing (such as labs, EKG, etc.), we will make arrangements to do so. Although advances in technology are sophisticated, we cannot ensure that it will always work on either your end or our end. If the connection with a video visit is poor, the visit may have to be switched to a telephone visit. With either a video or telephone visit, we are not always able to ensure that we have a secure connection.  By engaging in this virtual visit, you consent to the provision of healthcare and authorize for your insurance to be billed (if applicable) for the services provided during this visit. Depending on your insurance coverage, you may receive a charge related to this service.  I need to obtain your verbal consent now. Are you willing to proceed with your visit today? [name] has provided verbal consent on 06/26/2023 for a virtual visit (video or telephone). Bari Learn, FNP  Date: 06/26/2023 11:01 AM  Virtual Visit via Video Note   I, Bari Learn, connected with  [name]  ([id number], Sep 24, 1940) on 06/26/23 at 10:25 AM EST by a video-enabled telemedicine application and verified that I am speaking with the correct person using two identifiers.  Location: Patient: Virtual Visit Location Patient:  Home Provider: Virtual Visit Location Provider: Home Office   I discussed the limitations of evaluation and management by telemedicine and the availability of in person appointments. The patient expressed understanding and agreed to proceed.    History of Present Illness: [name] is a 83 y.o. who identifies as a male who was assigned male at birth, and is being seen today for chronic follow up.   PT is followed by Cardiologists for CAD, CHF, Atherosclerosis, Cardiomyopathy, and Ventricular tachycardia every 2 years.  PT has pacemaker and is followed annually for this.     Pt is followed by Dermatologists for psoriasis as needed.     Has OSA and uses CPAP nightly.    He  reports he quit drinking 05/13/22. He reports constant burning and aching pain of bilateral feet from alocholic peripheral neuropathy that is 7 out 10. The Lyrica  75 mg BID has greatly helped.    He has aortic atheroscleroses and takes pravastatin  daily.   He had a CVA in 05/13/22, with mild left sided weakness. He is doing PT. This has greatly improved.    He has emphysema. Smoking less 1/5 a pack a day. Denies any SOB.   He has taken his demadex  20 mg daily.  Complaining of loose stool and diarrhea.  HPI: Hypertension This is a chronic problem. The current episode started more than 1 year ago. The problem has been resolved since onset. The problem is controlled. Associated symptoms include anxiety, malaise/fatigue and peripheral edema. Pertinent negatives include  no shortness of breath. Risk factors for coronary artery disease include dyslipidemia, obesity, male gender, sedentary lifestyle and smoking/tobacco exposure. The current treatment provides moderate improvement.  Congestive Heart Failure Presents for follow-up visit. Associated symptoms include edema, fatigue and nocturia. Pertinent negatives include no shortness of breath. The symptoms have been stable.  Gastroesophageal Reflux He complains of  belching and heartburn. This is a chronic problem. The current episode started more than 1 year ago. The problem occurs occasionally. Associated symptoms include fatigue. He has tried a PPI for the symptoms. The treatment provided moderate relief.  Benign Prostatic Hypertrophy This is a chronic problem. The current episode started more than 1 year ago. The problem has been resolved since onset. Irritative symptoms include nocturia. Past treatments include tamsulosin . The treatment provided moderate relief.  Anxiety Presents for follow-up visit. Symptoms include excessive worry, irritability, nervous/anxious behavior and restlessness. Patient reports no shortness of breath.      Problems:  Patient Active Problem List   Diagnosis Date Noted   Leg swelling 08/16/2022   Acute on chronic combined systolic and diastolic HF (heart failure) (HCC) 07/31/2022   Dyspnea 07/28/2022   BPH (benign prostatic hyperplasia) 07/24/2022   CKD stage G3a/A1, GFR 45-59 and albumin creatinine ratio <30 mg/g (HCC) 07/24/2022   Normocytic anemia 07/24/2022   History of stroke 07/24/2022   Late effect of cerebrovascular accident (CVA) 06/16/2022   CHF, acute on chronic (HCC) 05/17/2022   OSA (obstructive sleep apnea) 02/14/2022   Paroxysmal atrial fibrillation (HCC) 10/25/2021   Secondary hypercoagulable state (HCC) 10/25/2021   Benign prostatic hyperplasia with urinary frequency 05/10/2021   Centrilobular emphysema (HCC) 12/17/2020   Aortic atherosclerosis (HCC) 12/17/2020   Central sleep apnea due to Cheyne-Stokes respiration 02/27/2020   Loud snoring 11/12/2019   GERD with apnea 11/12/2019   Alcoholic peripheral neuropathy (HCC) 10/15/2019   Apnea 10/15/2019   Benzodiazepine dependence (HCC) 05/06/2019   Controlled substance agreement signed 05/06/2019   Psoriasis 05/02/2017   Thoracic aorta atherosclerosis (HCC) 07/27/2016   Onychomycosis 07/01/2016   GAD (generalized anxiety disorder) 11/03/2014    Essential hypertension 11/03/2014   GERD (gastroesophageal reflux disease) 11/03/2014   Allergic rhinitis 11/03/2014   Nonischemic cardiomyopathy (HCC) 02/26/2014   Chronic systolic heart failure (HCC) 10/15/2012   History of colonic polyps 01/18/2012   Spondylosis 09/09/2010   Insomnia 09/09/2010   IBS (irritable bowel syndrome) 09/09/2010   PAROXYSMAL VENTRICULAR TACHYCARDIA 07/21/2010   Hyperlipidemia 08/17/2009   Alcohol  abuse, in remission 08/17/2009   TOBACCO ABUSE 08/17/2009   Coronary atherosclerosis 08/17/2009    Allergies:  Allergies  Allergen Reactions   Gabapentin  Anaphylaxis   Sudafed [Pseudoephedrine Hcl] Other (See Comments)    Unknown   Prednisone  Other (See Comments)    Depression and personality changes.   Avelox [Moxifloxacin Hcl In Nacl] Other (See Comments)    Unknown    Medications:  Current Outpatient Medications:    albuterol  (VENTOLIN  HFA) 108 (90 Base) MCG/ACT inhaler, INHALE 1 PUFF BY MOUTH EVERY 6 HOURS AS NEEDED FOR SHORTNESS OF BREATH, Disp: 9 g, Rfl: 2   ALPRAZolam  (XANAX ) 1 MG tablet, TAKE 1 TABLET BY MOUTH TWICE DAILY . APPOINTMENT REQUIRED FOR FUTURE REFILLS, Disp: 60 tablet, Rfl: 2   amLODipine  (NORVASC ) 2.5 MG tablet, Take 1 tablet (2.5 mg total) by mouth daily., Disp: 90 tablet, Rfl: 3   apixaban  (ELIQUIS ) 5 MG TABS tablet, Take 1 tablet by mouth twice daily, Disp: 60 tablet, Rfl: 5   cetirizine  (ZYRTEC ) 10 MG tablet, Take  10 mg by mouth daily as needed for allergies., Disp: , Rfl:    ferrous sulfate 324 MG TBEC, Take 324 mg by mouth., Disp: , Rfl:    linaclotide  (LINZESS ) 72 MCG capsule, Take 1 capsule (72 mcg total) by mouth daily before breakfast., Disp: 90 capsule, Rfl: 1   lisinopril  (ZESTRIL ) 40 MG tablet, Take 1 tablet (40 mg total) by mouth daily., Disp: 90 tablet, Rfl: 0   metoprolol  tartrate (LOPRESSOR ) 100 MG tablet, Take 1 tablet by mouth twice daily, Disp: 180 tablet, Rfl: 0   Multiple Vitamin (MULTIVITAMIN) capsule, Take 1  capsule by mouth daily., Disp: , Rfl:    pantoprazole  (PROTONIX ) 40 MG tablet, Take 1 tablet (40 mg total) by mouth daily., Disp: 90 tablet, Rfl: 1   pravastatin  (PRAVACHOL ) 80 MG tablet, Take 1 tablet (80 mg total) by mouth every evening., Disp: 90 tablet, Rfl: 3   tamsulosin  (FLOMAX ) 0.4 MG CAPS capsule, Take 1 capsule (0.4 mg total) by mouth daily., Disp: 90 capsule, Rfl: 0   torsemide  (DEMADEX ) 20 MG tablet, Take 2 tablets (40 mg total) by mouth daily. (Patient taking differently: Take 20 mg by mouth daily.), Disp: 180 tablet, Rfl: 3  Observations/Objective: Patient is well-developed, well-nourished in no acute distress.  Resting comfortably  at home.  Head is normocephalic, atraumatic.  No labored breathing.  Speech is clear and coherent with logical content.  Patient is alert and oriented at baseline.    Assessment and Plan: 1. Alcohol  abuse, in remission (Primary)  2. TOBACCO ABUSE  3. Atherosclerosis of native coronary artery with angina pectoris, unspecified whether native or transplanted heart (HCC)  4. Insomnia, unspecified type  5. Chronic systolic heart failure (HCC)  6. GAD (generalized anxiety disorder) - ALPRAZolam  (XANAX ) 1 MG tablet; TAKE 1 TABLET BY MOUTH TWICE DAILY . APPOINTMENT REQUIRED FOR FUTURE REFILLS  Dispense: 60 tablet; Refill: 2  7. Essential hypertension - lisinopril  (ZESTRIL ) 40 MG tablet; Take 1 tablet (40 mg total) by mouth daily.  Dispense: 90 tablet; Refill: 0  8. Gastroesophageal reflux disease, unspecified whether esophagitis present  9. Thoracic aorta atherosclerosis (HCC)  10. Controlled substance agreement signed  11. Alcoholic peripheral neuropathy (HCC)  12. OSA (obstructive sleep apnea)  13. Centrilobular emphysema (HCC)  14. Aortic atherosclerosis (HCC)  15. Benign prostatic hyperplasia with urinary frequency  16. Late effect of cerebrovascular accident (CVA)  41. Benign prostatic hyperplasia with incomplete bladder  emptying - tamsulosin  (FLOMAX ) 0.4 MG CAPS capsule; Take 1 capsule (0.4 mg total) by mouth daily.  Dispense: 90 capsule; Refill: 0  Continue current medications  Patient reviewed in [place] controlled database, no flags noted. Contract and drug screen are up to date.  Keep follow up with specialists  Follow up in 3 months   Follow Up Instructions: I discussed the assessment and treatment plan with the patient. The patient was provided an opportunity to ask questions and all were answered. The patient agreed with the plan and demonstrated an understanding of the instructions.  A copy of instructions were sent to the patient via MyChart unless otherwise noted below.     The patient was advised to call back or seek an in-person evaluation if the symptoms worsen or if the condition fails to improve as anticipated.    Bari Learn, FNP

## 2023-07-04 DIAGNOSIS — X32XXXD Exposure to sunlight, subsequent encounter: Secondary | ICD-10-CM | POA: Diagnosis not present

## 2023-07-04 DIAGNOSIS — L570 Actinic keratosis: Secondary | ICD-10-CM | POA: Diagnosis not present

## 2023-07-04 DIAGNOSIS — L820 Inflamed seborrheic keratosis: Secondary | ICD-10-CM | POA: Diagnosis not present

## 2023-07-04 DIAGNOSIS — H61001 Unspecified perichondritis of right external ear: Secondary | ICD-10-CM | POA: Diagnosis not present

## 2023-07-04 DIAGNOSIS — B078 Other viral warts: Secondary | ICD-10-CM | POA: Diagnosis not present

## 2023-07-25 ENCOUNTER — Other Ambulatory Visit: Payer: Self-pay | Admitting: Family

## 2023-07-25 DIAGNOSIS — F411 Generalized anxiety disorder: Secondary | ICD-10-CM

## 2023-07-31 ENCOUNTER — Ambulatory Visit (INDEPENDENT_AMBULATORY_CARE_PROVIDER_SITE_OTHER): Payer: Medicare Other

## 2023-07-31 DIAGNOSIS — I428 Other cardiomyopathies: Secondary | ICD-10-CM

## 2023-07-31 DIAGNOSIS — I5022 Chronic systolic (congestive) heart failure: Secondary | ICD-10-CM

## 2023-07-31 LAB — CUP PACEART REMOTE DEVICE CHECK
Battery Remaining Longevity: 3 mo — CL
Battery Remaining Percentage: 3 %
Brady Statistic RA Percent Paced: 0 %
Brady Statistic RV Percent Paced: 98 %
Date Time Interrogation Session: 20250210003100
HighPow Impedance: 58 Ohm
Implantable Lead Connection Status: 753985
Implantable Lead Connection Status: 753985
Implantable Lead Connection Status: 753985
Implantable Lead Implant Date: 20040312
Implantable Lead Implant Date: 20040312
Implantable Lead Implant Date: 20040312
Implantable Lead Location: 753858
Implantable Lead Location: 753859
Implantable Lead Location: 753860
Implantable Lead Model: 158
Implantable Lead Model: 4087
Implantable Lead Model: 4513
Implantable Lead Serial Number: 129158
Implantable Lead Serial Number: 209446
Implantable Lead Serial Number: 407264
Implantable Pulse Generator Implant Date: 20150917
Lead Channel Impedance Value: 1202 Ohm
Lead Channel Impedance Value: 552 Ohm
Lead Channel Impedance Value: 649 Ohm
Lead Channel Pacing Threshold Amplitude: 0.9 V
Lead Channel Pacing Threshold Amplitude: 1.2 V
Lead Channel Pacing Threshold Amplitude: 2.3 V
Lead Channel Pacing Threshold Pulse Width: 0.4 ms
Lead Channel Pacing Threshold Pulse Width: 0.4 ms
Lead Channel Pacing Threshold Pulse Width: 0.8 ms
Lead Channel Setting Pacing Amplitude: 2 V
Lead Channel Setting Pacing Amplitude: 2.5 V
Lead Channel Setting Pacing Amplitude: 3.3 V
Lead Channel Setting Pacing Pulse Width: 0.4 ms
Lead Channel Setting Pacing Pulse Width: 0.8 ms
Lead Channel Setting Sensing Sensitivity: 0.6 mV
Lead Channel Setting Sensing Sensitivity: 1 mV
Pulse Gen Serial Number: 106298

## 2023-08-02 ENCOUNTER — Encounter: Payer: Self-pay | Admitting: Cardiovascular Disease

## 2023-08-25 ENCOUNTER — Ambulatory Visit: Payer: Medicare Other | Attending: Cardiovascular Disease | Admitting: Cardiovascular Disease

## 2023-08-25 ENCOUNTER — Encounter: Payer: Self-pay | Admitting: Cardiovascular Disease

## 2023-08-25 VITALS — BP 148/82 | HR 70 | Ht 66.0 in | Wt 143.2 lb

## 2023-08-25 DIAGNOSIS — I5022 Chronic systolic (congestive) heart failure: Secondary | ICD-10-CM

## 2023-08-25 LAB — CUP PACEART INCLINIC DEVICE CHECK
Date Time Interrogation Session: 20250307121816
HighPow Impedance: 57 Ohm
Implantable Lead Connection Status: 753985
Implantable Lead Connection Status: 753985
Implantable Lead Connection Status: 753985
Implantable Lead Implant Date: 20040312
Implantable Lead Implant Date: 20040312
Implantable Lead Implant Date: 20040312
Implantable Lead Location: 753858
Implantable Lead Location: 753859
Implantable Lead Location: 753860
Implantable Lead Model: 158
Implantable Lead Model: 4087
Implantable Lead Model: 4513
Implantable Lead Serial Number: 129158
Implantable Lead Serial Number: 209446
Implantable Lead Serial Number: 407264
Implantable Pulse Generator Implant Date: 20150917
Lead Channel Impedance Value: 1187 Ohm
Lead Channel Impedance Value: 521 Ohm
Lead Channel Impedance Value: 663 Ohm
Lead Channel Pacing Threshold Amplitude: 0 V
Lead Channel Pacing Threshold Amplitude: 0.7 V
Lead Channel Pacing Threshold Amplitude: 1.2 V
Lead Channel Pacing Threshold Amplitude: 2.4 V
Lead Channel Pacing Threshold Pulse Width: 0 ms
Lead Channel Pacing Threshold Pulse Width: 0.4 ms
Lead Channel Pacing Threshold Pulse Width: 0.4 ms
Lead Channel Pacing Threshold Pulse Width: 0.8 ms
Lead Channel Sensing Intrinsic Amplitude: 0.5 mV
Lead Channel Setting Pacing Amplitude: 2 V
Lead Channel Setting Pacing Amplitude: 2.5 V
Lead Channel Setting Pacing Amplitude: 3.3 V
Lead Channel Setting Pacing Pulse Width: 0.4 ms
Lead Channel Setting Pacing Pulse Width: 0.8 ms
Lead Channel Setting Sensing Sensitivity: 0.6 mV
Lead Channel Setting Sensing Sensitivity: 1 mV
Pulse Gen Serial Number: 106298

## 2023-08-25 NOTE — Progress Notes (Signed)
   PCP: Junie Spencer, FNP Primary Cardiologist: Dr Antoine Poche Primary EP: Dr Daphine Deutscher is a 83 y.o. male who presents today for routine electrophysiology followup.  Since last being seen in our clinic, the patient reports doing very well.       Today, he denies symptoms of palpitations, chest pain, shortness of breath,  lower extremity edema, dizziness, presyncope, syncope, or ICD shocks.  he has no device related complaints -- no new tenderness, drainage, redness. The patient is otherwise without complaint today.       Physical Exam: Vitals:   08/25/23 1104 08/25/23 1112  BP: (!) 152/84 (!) 148/82  Pulse: 70   SpO2: 100%   Weight: 143 lb 3.2 oz (65 kg)   Height: 5\' 6"  (1.676 m)     Gen: Appears comfortable, well-nourished CV: RRR, no dependent edema The device site is normal -- no tenderness, edema, drainage, redness, threatened erosion. Pulm: breathing easily   ICD interrogation- reviewed in detail today,  See PACEART report    Wt Readings from Last 3 Encounters:  08/25/23 143 lb 3.2 oz (65 kg)  03/24/23 143 lb 6.4 oz (65 kg)  03/01/23 145 lb (65.8 kg)   EKG Interpretation Date/Time:  Friday August 25 2023 11:09:43 EST Ventricular Rate:  70 PR Interval:    QRS Duration:  152 QT Interval:  474 QTC Calculation: 511 R Axis:   270  Text Interpretation: Ventricular-paced rhythm Biventricular pacemaker detected When compared with ECG of 24-Feb-2023 11:24, No significant change was found Confirmed by York Pellant [id number]) on 08/25/2023 11:37:45 AM   Assessment and Plan:  1.  Chronic systolic dysfunction/ nonischemic CM euvolemic today EF recovered with CRT Stable on an appropriate medical regimen Normal BiV ICD function See Pace Art report No changes today he is not device dependant today  He is approaching RRT further his device.  We discussed the generator change procedure.  I explained risks of infection, bleeding, damage to existing  leads.  We will not need a follow-up visit prior to scheduling the procedure.  2. VT Previously noted Current well controlled; only one 5 beat V run on interrogation today  3. HTN Stable No change required today  4. Paroxysmal atrial fibrillation I suspect his AF is persistent and undersensed Chads2vasc scoreis at least 5 on eliquis 5 -- will need to decrease dose to 2.5mg  BID at age 27     Maurice Small, MD 08/25/2023 11:36 AM

## 2023-08-25 NOTE — H&P (View-Only) (Signed)
   PCP: Junie Spencer, FNP Primary Cardiologist: Dr Antoine Poche Primary EP: Dr Daphine Deutscher is a 83 y.o. male who presents today for routine electrophysiology followup.  Since last being seen in our clinic, the patient reports doing very well.       Today, he denies symptoms of palpitations, chest pain, shortness of breath,  lower extremity edema, dizziness, presyncope, syncope, or ICD shocks.  he has no device related complaints -- no new tenderness, drainage, redness. The patient is otherwise without complaint today.       Physical Exam: Vitals:   08/25/23 1104 08/25/23 1112  BP: (!) 152/84 (!) 148/82  Pulse: 70   SpO2: 100%   Weight: 143 lb 3.2 oz (65 kg)   Height: 5\' 6"  (1.676 m)     Gen: Appears comfortable, well-nourished CV: RRR, no dependent edema The device site is normal -- no tenderness, edema, drainage, redness, threatened erosion. Pulm: breathing easily   ICD interrogation- reviewed in detail today,  See PACEART report    Wt Readings from Last 3 Encounters:  08/25/23 143 lb 3.2 oz (65 kg)  03/24/23 143 lb 6.4 oz (65 kg)  03/01/23 145 lb (65.8 kg)   EKG Interpretation Date/Time:  Friday August 25 2023 11:09:43 EST Ventricular Rate:  70 PR Interval:    QRS Duration:  152 QT Interval:  474 QTC Calculation: 511 R Axis:   270  Text Interpretation: Ventricular-paced rhythm Biventricular pacemaker detected When compared with ECG of 24-Feb-2023 11:24, No significant change was found Confirmed by York Pellant [id number]) on 08/25/2023 11:37:45 AM   Assessment and Plan:  1.  Chronic systolic dysfunction/ nonischemic CM euvolemic today EF recovered with CRT Stable on an appropriate medical regimen Normal BiV ICD function See Pace Art report No changes today he is not device dependant today  He is approaching RRT further his device.  We discussed the generator change procedure.  I explained risks of infection, bleeding, damage to existing  leads.  We will not need a follow-up visit prior to scheduling the procedure.  2. VT Previously noted Current well controlled; only one 5 beat V run on interrogation today  3. HTN Stable No change required today  4. Paroxysmal atrial fibrillation I suspect his AF is persistent and undersensed Chads2vasc scoreis at least 5 on eliquis 5 -- will need to decrease dose to 2.5mg  BID at age 27     Maurice Small, MD 08/25/2023 11:36 AM

## 2023-08-25 NOTE — Patient Instructions (Addendum)

## 2023-08-27 NOTE — Progress Notes (Unsigned)
  Cardiology Office Note:   Date:  08/27/2023  ID:  [name], [name] 04/09/1941, MRN [id number] PCP: Junie Spencer, FNP  [facility] Providers Cardiologist:  Rollene Rotunda, MD Electrophysiologist:  Maurice Small, MD {  History of Present Illness:   [name] is a 83 y.o. male who presents for follow up of non ischemic cardiomyopathy and atrial fib.  He had a stroke Dec 2023.  He had resolution of hemiparesis.  He was hospitalized at [facility] with volume overload.  He had an EF of 50 - 55% on echo.    There was moderate TR.  He was managed for acute on chronic diastolic HF.   He had AKI.   Since I last saw him ***       ***He did have some low blood pressures.  He has had to have his amlodipine held.  However, now his blood pressures are creeping up and they are consistently above 140.  He has been feeling okay.  The patient denies any new symptoms such as chest discomfort, neck or arm discomfort. There has been no new shortness of breath, PND or orthopnea. There have been no reported palpitations, presyncope or syncope.   ROS: ***  Studies Reviewed:    EKG:       ***  Risk Assessment/Calculations:   {Does this patient have ATRIAL FIBRILLATION?[phone]} No BP recorded.  {Refresh Note OR Click here to enter BP  :1}***        Physical Exam:   VS:  There were no vitals taken for this visit.   Wt Readings from Last 3 Encounters:  08/25/23 143 lb 3.2 oz (65 kg)  03/24/23 143 lb 6.4 oz (65 kg)  03/01/23 145 lb (65.8 kg)     GEN: Well nourished, well developed in no acute distress NECK: No JVD; No carotid bruits CARDIAC: ***RR, *** murmurs, rubs, gallops RESPIRATORY:  Clear to auscultation without rales, wheezing or rhonchi  ABDOMEN: Soft, non-tender, non-distended EXTREMITIES:  No edema; No deformity   ASSESSMENT AND PLAN:   PAF:    [name] has a CHA2DS2 - VASc score of 4.  ***   He has had no symptomatic paroxysms.  He tolerates  anticoagulation.  No change in therapy.   CHRONIC SYSTOLIC AND DIASTOLIC HF:   *** He seems to be euvolemic.  Continue meds as listed.   VENTRICULAR TACHYCARDIA:   He is status post CRT/ICD.  He had normal function and saw Dr. Nelly Laurence last month.  ***  He is up-to-date with device follow-up.  I did review his most recent tracing for this visit and he has had no sustained episodes.  He had a couple of nonsustained VT's.    TOBACCO ABUSE:  ***  Again we talked about the need to stop smoking.   DYSLIPIDEMIA:   LDL was *** 68 with an HDL of 43.  No change in therapy.    HTN: His blood pressure is *** creeping back up.  I will add back the amlodipine 2.5.   SLEEP APNEA:   ***  He uses BiPAP.   CKD:   Creatinine is *** 1.68.  This was in August.  This is slightly up from previous but he is tolerating current diuretic.     Follow up ***  Signed, Rollene Rotunda, MD

## 2023-08-28 ENCOUNTER — Other Ambulatory Visit: Payer: Self-pay | Admitting: Family

## 2023-08-28 ENCOUNTER — Telehealth: Payer: Self-pay

## 2023-08-28 DIAGNOSIS — I10 Essential (primary) hypertension: Secondary | ICD-10-CM

## 2023-08-28 DIAGNOSIS — G621 Alcoholic polyneuropathy: Secondary | ICD-10-CM

## 2023-08-28 NOTE — Telephone Encounter (Signed)
 Called to inquire if patient was coming to the [place] office to reset alarm. Pts daughter states he decided not and can wait until the procedure to have the alarm done. Advised if pt changes his mind to call the device clinic and we will be happy to bring him in to turn off the alarm. Advised patient will have to come to the [place] office. Voiced understanding and was appreciative of call.

## 2023-08-28 NOTE — Telephone Encounter (Signed)
 Alert remote transmission:  ERI reached on 3/9.  Sending to triage. Known likely persistent AF.  On OAC.  Follow up as scheduled.  Continue monthly battery monitoring until generator change.   [place], CVRS   Called and spoke w/ pts daughter. His device is toning due to RRT. They are going to [facility] for Device RN to disable tone. Sent to AG to schedule procedure.

## 2023-08-30 ENCOUNTER — Ambulatory Visit (INDEPENDENT_AMBULATORY_CARE_PROVIDER_SITE_OTHER): Payer: Medicare Other | Admitting: Cardiology

## 2023-08-30 ENCOUNTER — Encounter: Payer: Self-pay | Admitting: Cardiology

## 2023-08-30 ENCOUNTER — Other Ambulatory Visit: Payer: Self-pay | Admitting: *Deleted

## 2023-08-30 VITALS — BP 140/78 | HR 68 | Ht 66.0 in | Wt 143.0 lb

## 2023-08-30 DIAGNOSIS — I10 Essential (primary) hypertension: Secondary | ICD-10-CM

## 2023-08-30 DIAGNOSIS — I5022 Chronic systolic (congestive) heart failure: Secondary | ICD-10-CM

## 2023-08-30 DIAGNOSIS — N1831 Chronic kidney disease, stage 3a: Secondary | ICD-10-CM

## 2023-08-30 DIAGNOSIS — E785 Hyperlipidemia, unspecified: Secondary | ICD-10-CM

## 2023-08-30 DIAGNOSIS — Z79899 Other long term (current) drug therapy: Secondary | ICD-10-CM | POA: Diagnosis not present

## 2023-08-30 DIAGNOSIS — I480 Paroxysmal atrial fibrillation: Secondary | ICD-10-CM

## 2023-08-30 NOTE — Patient Instructions (Signed)
 Medication Instructions:  The current medical regimen is effective;  continue present plan and medications.  *If you need a refill on your cardiac medications before your next appointment, please call your pharmacy*   Lab Work: Please have blood work at your closest [employer] (BMP, Lipid)  If you have labs (blood work) drawn today and your tests are completely normal, you will receive your results only by: MyChart Message (if you have MyChart) OR A paper copy in the mail If you have any lab test that is abnormal or we need to change your treatment, we will call you to review the results.   Follow-Up: At [facility], you and your health needs are our priority.  As part of our continuing mission to provide you with exceptional heart care, we have created designated Provider Care Teams.  These Care Teams include your primary Cardiologist (physician) and Advanced Practice Providers (APPs -  Physician Assistants and Nurse Practitioners) who all work together to provide you with the care you need, when you need it.  We recommend signing up for the patient portal called "MyChart".  Sign up information is provided on this After Visit Summary.  MyChart is used to connect with patients for Virtual Visits (Telemedicine).  Patients are able to view lab/test results, encounter notes, upcoming appointments, etc.  Non-urgent messages can be sent to your provider as well.   To learn more about what you can do with MyChart, go to [email or web address].    Your next appointment:   1 year(s)  Provider:   Rollene Rotunda, MD

## 2023-08-31 ENCOUNTER — Encounter: Payer: Self-pay | Admitting: Cardiovascular Disease

## 2023-08-31 ENCOUNTER — Other Ambulatory Visit: Payer: Self-pay | Admitting: *Deleted

## 2023-08-31 ENCOUNTER — Encounter: Payer: Self-pay | Admitting: *Deleted

## 2023-08-31 DIAGNOSIS — I480 Paroxysmal atrial fibrillation: Secondary | ICD-10-CM

## 2023-08-31 DIAGNOSIS — I5022 Chronic systolic (congestive) heart failure: Secondary | ICD-10-CM

## 2023-08-31 DIAGNOSIS — Z79899 Other long term (current) drug therapy: Secondary | ICD-10-CM

## 2023-08-31 NOTE — Telephone Encounter (Signed)
 Pt scheduled for BiV-ICD Gen Change with Dr. Nelly Laurence on 3/31 at 3:00 pm.

## 2023-09-04 ENCOUNTER — Other Ambulatory Visit: Payer: Self-pay | Admitting: Family

## 2023-09-04 DIAGNOSIS — G621 Alcoholic polyneuropathy: Secondary | ICD-10-CM

## 2023-09-06 DIAGNOSIS — E785 Hyperlipidemia, unspecified: Secondary | ICD-10-CM | POA: Diagnosis not present

## 2023-09-06 DIAGNOSIS — I10 Essential (primary) hypertension: Secondary | ICD-10-CM | POA: Diagnosis not present

## 2023-09-06 DIAGNOSIS — I5022 Chronic systolic (congestive) heart failure: Secondary | ICD-10-CM | POA: Diagnosis not present

## 2023-09-06 DIAGNOSIS — Z79899 Other long term (current) drug therapy: Secondary | ICD-10-CM | POA: Diagnosis not present

## 2023-09-07 LAB — BASIC METABOLIC PANEL
BUN/Creatinine Ratio: 18 (ref 10–24)
BUN: 29 mg/dL — ABNORMAL HIGH (ref 8–27)
CO2: 20 mmol/L (ref 20–29)
Calcium: 9.7 mg/dL (ref 8.6–10.2)
Chloride: 98 mmol/L (ref 96–106)
Creatinine, Ser: 1.65 mg/dL — ABNORMAL HIGH (ref 0.76–1.27)
Glucose: 98 mg/dL (ref 70–99)
Potassium: 5.3 mmol/L — ABNORMAL HIGH (ref 3.5–5.2)
Sodium: 136 mmol/L (ref 134–144)
eGFR: 42 mL/min/{1.73_m2} — ABNORMAL LOW (ref >59)

## 2023-09-07 LAB — LIPID PANEL
Chol/HDL Ratio: 3.2 ratio (ref 0.0–5.0)
Cholesterol, Total: 168 mg/dL (ref 100–199)
HDL: 53 mg/dL (ref >39)
LDL Chol Calc (NIH): 90 mg/dL (ref 0–99)
Triglycerides: 142 mg/dL (ref 0–149)
VLDL Cholesterol Cal: 25 mg/dL (ref 5–40)

## 2023-09-08 ENCOUNTER — Other Ambulatory Visit: Payer: Self-pay

## 2023-09-08 DIAGNOSIS — N1831 Chronic kidney disease, stage 3a: Secondary | ICD-10-CM

## 2023-09-08 DIAGNOSIS — I5022 Chronic systolic (congestive) heart failure: Secondary | ICD-10-CM

## 2023-09-08 DIAGNOSIS — I10 Essential (primary) hypertension: Secondary | ICD-10-CM

## 2023-09-11 ENCOUNTER — Telehealth (HOSPITAL_COMMUNITY): Payer: Self-pay

## 2023-09-11 NOTE — Progress Notes (Signed)
 Remote ICD transmission.

## 2023-09-11 NOTE — Telephone Encounter (Signed)
 Spoke with patient's daughter [name] to discuss upcoming procedure/ DPR on file.  Confirmed patient is scheduled for a ICD generator change on Monday, March 31 with Dr. York Pellant. Instructed patient to arrive at the Main Entrance A at [facility]: [address] [place], [place] [address] and check in at Admitting at 1:00 PM.   BMP completed. Will have CBC and repeat BMP to evaluate potassium level on 09/15/23  Any recent signs of acute illness or been started on antibiotics? No Any new medications started? No Any medications to hold? Eliquis for 2 days, Torsemide AM of procedure Medication instructions:  On the morning of your procedure take all other morning medications other than previously discussed No eating or drinking after midnight prior to procedure.   The night before your procedure and the morning of your procedure scrub your neck/chest with the CHG surgical soap.   Advised of plan to go home the same day and will only stay overnight if medically necessary. You MUST have a responsible adult to drive you home and MUST be with you the first 24 hours after you arrive home.  Patient's daughter verbalized understanding to all instructions provided and agreed to proceed with procedure.

## 2023-09-15 DIAGNOSIS — I10 Essential (primary) hypertension: Secondary | ICD-10-CM | POA: Diagnosis not present

## 2023-09-15 DIAGNOSIS — Z79899 Other long term (current) drug therapy: Secondary | ICD-10-CM | POA: Diagnosis not present

## 2023-09-15 DIAGNOSIS — N1831 Chronic kidney disease, stage 3a: Secondary | ICD-10-CM | POA: Diagnosis not present

## 2023-09-15 DIAGNOSIS — I5022 Chronic systolic (congestive) heart failure: Secondary | ICD-10-CM | POA: Diagnosis not present

## 2023-09-15 DIAGNOSIS — I480 Paroxysmal atrial fibrillation: Secondary | ICD-10-CM | POA: Diagnosis not present

## 2023-09-16 LAB — BASIC METABOLIC PANEL WITH GFR
BUN/Creatinine Ratio: 17 (ref 10–24)
BUN/Creatinine Ratio: 17 (ref 10–24)
BUN: 26 mg/dL (ref 8–27)
BUN: 27 mg/dL (ref 8–27)
CO2: 20 mmol/L (ref 20–29)
CO2: 22 mmol/L (ref 20–29)
Calcium: 9.3 mg/dL (ref 8.6–10.2)
Calcium: 9.5 mg/dL (ref 8.6–10.2)
Chloride: 97 mmol/L (ref 96–106)
Chloride: 97 mmol/L (ref 96–106)
Creatinine, Ser: 1.55 mg/dL — ABNORMAL HIGH (ref 0.76–1.27)
Creatinine, Ser: 1.61 mg/dL — ABNORMAL HIGH (ref 0.76–1.27)
Glucose: 102 mg/dL — ABNORMAL HIGH (ref 70–99)
Glucose: 105 mg/dL — ABNORMAL HIGH (ref 70–99)
Potassium: 4.5 mmol/L (ref 3.5–5.2)
Potassium: 4.5 mmol/L (ref 3.5–5.2)
Sodium: 133 mmol/L — ABNORMAL LOW (ref 134–144)
Sodium: 135 mmol/L (ref 134–144)
eGFR: 43 mL/min/{1.73_m2} — ABNORMAL LOW (ref >59)
eGFR: 45 mL/min/{1.73_m2} — ABNORMAL LOW (ref >59)

## 2023-09-16 LAB — CBC
Hematocrit: 33.8 % — ABNORMAL LOW (ref 37.5–51.0)
Hemoglobin: 11 g/dL — ABNORMAL LOW (ref 13.0–17.7)
MCH: 32 pg (ref 26.6–33.0)
MCHC: 32.5 g/dL (ref 31.5–35.7)
MCV: 98 fL — ABNORMAL HIGH (ref 79–97)
Platelets: 264 10*3/uL (ref 150–450)
RBC: 3.44 x10E6/uL — ABNORMAL LOW (ref 4.14–5.80)
RDW: 12.1 % (ref 11.6–15.4)
WBC: 6.2 10*3/uL (ref 3.4–10.8)

## 2023-09-17 NOTE — Pre-Procedure Instructions (Signed)
 Spoke with patient's daughter [name].  Informed her on the following items: Arrival time 2:30, new time May have light breakfast before 8am, then Nothing to eat or drink ht No meds AM of procedure Responsible person to drive you home and stay with you for 24 hrs Wash with special soap night before and morning of procedure If on anti-coagulant drug instructions Eliquis- last dose was 3/28.

## 2023-09-18 ENCOUNTER — Encounter (HOSPITAL_COMMUNITY)
Admission: RE | Disposition: A | Discharge status: Home / Self Care | Payer: Self-pay | Source: Home / Self Care | Attending: Cardiovascular Disease

## 2023-09-18 ENCOUNTER — Ambulatory Visit (HOSPITAL_COMMUNITY)
Admission: RE | Admit: 2023-09-18 | Discharge: 2023-09-18 | Disposition: A | Discharge status: Home / Self Care | Attending: Cardiovascular Disease | Admitting: Cardiovascular Disease

## 2023-09-18 ENCOUNTER — Encounter: Payer: Self-pay | Admitting: *Deleted

## 2023-09-18 ENCOUNTER — Other Ambulatory Visit: Payer: Self-pay

## 2023-09-18 DIAGNOSIS — Z7901 Long term (current) use of anticoagulants: Secondary | ICD-10-CM | POA: Insufficient documentation

## 2023-09-18 DIAGNOSIS — I428 Other cardiomyopathies: Secondary | ICD-10-CM | POA: Insufficient documentation

## 2023-09-18 DIAGNOSIS — Z4502 Encounter for adjustment and management of automatic implantable cardiac defibrillator: Secondary | ICD-10-CM | POA: Insufficient documentation

## 2023-09-18 DIAGNOSIS — I10 Essential (primary) hypertension: Secondary | ICD-10-CM | POA: Diagnosis not present

## 2023-09-18 DIAGNOSIS — I480 Paroxysmal atrial fibrillation: Secondary | ICD-10-CM | POA: Insufficient documentation

## 2023-09-18 HISTORY — PX: BIV ICD GENERATOR CHANGEOUT: EP1194

## 2023-09-18 SURGERY — BIV ICD GENERATOR CHANGEOUT

## 2023-09-18 MED ORDER — SODIUM CHLORIDE 0.9 % IV SOLN
INTRAVENOUS | Status: DC
Start: 2023-09-18 — End: 2023-09-18

## 2023-09-18 MED ORDER — MIDAZOLAM HCL 5 MG/5ML IJ SOLN
INTRAMUSCULAR | Status: DC | PRN
  Administered 2023-09-18: 1 mg via INTRAVENOUS

## 2023-09-18 MED ORDER — CEFAZOLIN SODIUM-DEXTROSE 2-4 GM/100ML-% IV SOLN
INTRAVENOUS | Status: AC
  Administered 2023-09-18: 2 g via INTRAVENOUS
  Filled 2023-09-18: qty 100

## 2023-09-18 MED ORDER — CEFAZOLIN SODIUM-DEXTROSE 2-4 GM/100ML-% IV SOLN: 2.0000 g | INTRAVENOUS | Status: AC

## 2023-09-18 MED ORDER — LIDOCAINE HCL (PF) 1 % IJ SOLN
INTRAMUSCULAR | Status: AC
  Filled 2023-09-18: qty 60

## 2023-09-18 MED ORDER — MIDAZOLAM HCL 2 MG/2ML IJ SOLN
INTRAMUSCULAR | Status: AC
  Filled 2023-09-18: qty 2

## 2023-09-18 MED ORDER — FENTANYL CITRATE (PF) 100 MCG/2ML IJ SOLN
INTRAMUSCULAR | Status: DC | PRN
  Administered 2023-09-18: 25 ug via INTRAVENOUS

## 2023-09-18 MED ORDER — SODIUM CHLORIDE 0.9 % IV SOLN
80.0000 mg | INTRAVENOUS | Status: AC
  Administered 2023-09-18: 80 mg

## 2023-09-18 MED ORDER — FENTANYL CITRATE (PF) 100 MCG/2ML IJ SOLN
INTRAMUSCULAR | Status: AC
  Filled 2023-09-18: qty 2

## 2023-09-18 MED ORDER — LIDOCAINE HCL (PF) 1 % IJ SOLN
INTRAMUSCULAR | Status: DC | PRN
  Administered 2023-09-18: 50 mL

## 2023-09-18 MED ORDER — POVIDONE-IODINE 10 % EX SWAB
2.0000 | Freq: Once | CUTANEOUS | Status: AC
  Administered 2023-09-18: 2 via TOPICAL

## 2023-09-18 MED ORDER — SODIUM CHLORIDE 0.9 % IV SOLN
INTRAVENOUS | Status: AC
  Filled 2023-09-18: qty 2

## 2023-09-18 MED ORDER — CHLORHEXIDINE GLUCONATE 4 % EX SOLN
4.0000 | Freq: Once | CUTANEOUS | Status: DC
  Filled 2023-09-18: qty 60

## 2023-09-18 SURGICAL SUPPLY — 7 items
CABLE SURGICAL S-101-97-12 (CABLE) ×1 IMPLANT
DEVICE DISSECT PLASMABLAD 3.0S (MISCELLANEOUS) IMPLANT
ICD MOMENTUM G126 (ICD Generator) IMPLANT
PAD DEFIB RADIO PHYSIO CONN (PAD) ×1 IMPLANT
PLASMABLADE 3.0S (MISCELLANEOUS) ×1 IMPLANT
SHEATH PROBE COVER 6X72 (BAG) IMPLANT
TRAY PACEMAKER INSERTION (PACKS) ×1 IMPLANT

## 2023-09-18 NOTE — Interval H&P Note (Signed)
 History and Physical Interval Note:  09/18/2023 3:19 PM  [name]  has presented today for surgery, with the diagnosis of eri.  The various methods of treatment have been discussed with the patient and family. After consideration of risks, benefits and other options for treatment, the patient has consented to  Procedure(s): BIV ICD GENERATOR CHANGEOUT (N/A) as a surgical intervention.  The patient's history has been reviewed, patient examined, no change in status, stable for surgery.  I have reviewed the patient's chart and labs.  Questions were answered to the patient's satisfaction.     Roberts Gaudy Dehlia Kilner

## 2023-09-18 NOTE — Discharge Instructions (Signed)
 Implantable Cardiac Device Extraction, Care After  This sheet gives you information about how to care for yourself after your procedure. Your health care provider may also give you more specific instructions. If you have problems or questions, contact your health care provider.  What can I expect after the procedure? After your procedure, it is common to have: Pain or soreness at the site where the cardiac device was removed. Mild Swelling at the site where the cardiac device was inserted.  Follow these instructions at home: Incision care  Keep the incision clean and dry. Do not take baths, swim, or use a hot tub until after your wound check.  Do not shower for at least 7 days, or as directed by your health care provider. Pat the area dry with a clean towel. Do not rub the area. This may cause bleeding. Follow instructions from your health care provider about how to take care of your incision. Make sure you: Leave stitches (sutures), skin glue, or adhesive strips in place. These skin closures may need to stay in place for 2 weeks or longer. If adhesive strip edges start to loosen and curl up, you may trim the loose edges. Do not remove adhesive strips completely unless your health care provider tells you to do that. Check around your incision area every day for signs of infection. Check for: More redness, swelling, or pain. More fluid or blood. Warmth. Pus or a bad smell. Activity Do not lift anything that is heavier than 10 lb (4.5 kg) until your health care provider says it is okay to do so. For the first week, or as long as told by your health care provider: Avoid lifting your affected arm higher than your shoulder. Avoid strenuous exercise. Ask your health care provider when it is okay to: Resume your normal activities. Return to work or school. Resume sexual activity. Contact a health care provider if: You have any of these around your incision site or coming from it: More  redness, swelling, or pain. Fluid or blood. Warmth to the touch. Pus or a bad smell. You have a fever. Get help right away if: You experience chest pain that is different from the pain at the cardiac device site. You develop a red streak that extends above or below the incision site. You experience shortness of breath. You have light-headedness that does not go away quickly. You faint or have dizzy spells. Your pulse suddenly drops or increases rapidly and does not return to normal. You begin to gain weight and your legs and ankles swell. Summary After your procedure, it is common to have pain, soreness, and some swelling where the cardiac device was removed. Make sure to keep your incision clean and dry. Follow instructions from your health care provider about how to take care of your incision. Check your incision every day for signs of infection, such as more pain or swelling, pus or a bad smell, warmth, or leaking fluid and blood. Avoid strenuous exercise and lifting your left arm higher than your shoulder for 2 weeks, or as long as told by your health care provider. This information is not intended to replace advice given to you by your health care provider. Make sure you discuss any questions you have with your health care provider.

## 2023-09-19 ENCOUNTER — Encounter (HOSPITAL_COMMUNITY): Payer: Self-pay | Admitting: Cardiovascular Disease

## 2023-09-19 ENCOUNTER — Encounter: Payer: Self-pay | Admitting: Cardiovascular Disease

## 2023-09-19 NOTE — Telephone Encounter (Signed)
 Called patient to speak with about bleeding. Daughter states the incision has been bleeding/oozing bright red blood for several hours. Denies swelling/redness or warmth to site. Advised to hold Eliquis tonight per Dr. Elberta Fortis.  Talked to Dr. Elberta Fortis who advised patient go to ER for evaluation [facility]) if possible to reassess incision. Patient refused to go to ER. Device clinic apt made 09/20/23 @ 8:40 am (Dr. Nelly Laurence is DOD). Patient advised to take fold 2 wash cloths and hold firm pressure for minimum of 30 minutes. If bleeding increases patient was encouraged to go to ER. Pts daughter is aware of patient/situation.

## 2023-09-20 ENCOUNTER — Ambulatory Visit

## 2023-09-21 ENCOUNTER — Ambulatory Visit

## 2023-09-25 ENCOUNTER — Encounter

## 2023-10-03 ENCOUNTER — Encounter: Payer: Self-pay | Admitting: Family

## 2023-10-03 ENCOUNTER — Ambulatory Visit: Admitting: Family

## 2023-10-03 VITALS — BP 137/70 | HR 70 | Temp 97.2°F | Ht 68.0 in | Wt 143.2 lb

## 2023-10-03 DIAGNOSIS — F1320 Sedative, hypnotic or anxiolytic dependence, uncomplicated: Secondary | ICD-10-CM

## 2023-10-03 DIAGNOSIS — F17200 Nicotine dependence, unspecified, uncomplicated: Secondary | ICD-10-CM

## 2023-10-03 DIAGNOSIS — F1011 Alcohol abuse, in remission: Secondary | ICD-10-CM

## 2023-10-03 DIAGNOSIS — K219 Gastro-esophageal reflux disease without esophagitis: Secondary | ICD-10-CM | POA: Diagnosis not present

## 2023-10-03 DIAGNOSIS — I25119 Atherosclerotic heart disease of native coronary artery with unspecified angina pectoris: Secondary | ICD-10-CM | POA: Diagnosis not present

## 2023-10-03 DIAGNOSIS — I10 Essential (primary) hypertension: Secondary | ICD-10-CM | POA: Diagnosis not present

## 2023-10-03 DIAGNOSIS — E785 Hyperlipidemia, unspecified: Secondary | ICD-10-CM | POA: Diagnosis not present

## 2023-10-03 DIAGNOSIS — Z79899 Other long term (current) drug therapy: Secondary | ICD-10-CM

## 2023-10-03 DIAGNOSIS — G621 Alcoholic polyneuropathy: Secondary | ICD-10-CM

## 2023-10-03 DIAGNOSIS — I700 Atherosclerosis of aorta: Secondary | ICD-10-CM

## 2023-10-03 DIAGNOSIS — Z0001 Encounter for general adult medical examination with abnormal findings: Secondary | ICD-10-CM | POA: Diagnosis not present

## 2023-10-03 DIAGNOSIS — Z0000 Encounter for general adult medical examination without abnormal findings: Secondary | ICD-10-CM

## 2023-10-03 DIAGNOSIS — G4700 Insomnia, unspecified: Secondary | ICD-10-CM

## 2023-10-03 DIAGNOSIS — N401 Enlarged prostate with lower urinary tract symptoms: Secondary | ICD-10-CM

## 2023-10-03 DIAGNOSIS — I6930 Unspecified sequelae of cerebral infarction: Secondary | ICD-10-CM

## 2023-10-03 DIAGNOSIS — F411 Generalized anxiety disorder: Secondary | ICD-10-CM

## 2023-10-03 LAB — LIPID PANEL

## 2023-10-03 MED ORDER — LISINOPRIL 40 MG PO TABS: 40.0000 mg | ORAL_TABLET | Freq: Every day | ORAL | 0 refills | Status: DC

## 2023-10-03 MED ORDER — PREGABALIN 75 MG PO CAPS: 75.0000 mg | ORAL_CAPSULE | Freq: Two times a day (BID) | ORAL | 0 refills | Status: DC

## 2023-10-03 MED ORDER — PANTOPRAZOLE SODIUM 40 MG PO TBEC
40.0000 mg | DELAYED_RELEASE_TABLET | Freq: Every day | ORAL | 1 refills | Status: DC
Start: 2023-10-03 — End: 2023-11-22

## 2023-10-03 MED ORDER — TAMSULOSIN HCL 0.4 MG PO CAPS: 0.4000 mg | ORAL_CAPSULE | Freq: Every day | ORAL | 0 refills | Status: DC

## 2023-10-03 MED ORDER — METOPROLOL TARTRATE 100 MG PO TABS: 100.0000 mg | ORAL_TABLET | Freq: Two times a day (BID) | ORAL | 0 refills | Status: DC

## 2023-10-03 MED ORDER — ALPRAZOLAM 1 MG PO TABS
ORAL_TABLET | ORAL | 2 refills | Status: DC
Start: 2023-10-03 — End: 2024-01-02

## 2023-10-03 MED ORDER — PRAVASTATIN SODIUM 80 MG PO TABS: 80.0000 mg | ORAL_TABLET | Freq: Every evening | ORAL | 3 refills | Status: DC

## 2023-10-03 NOTE — Progress Notes (Signed)
 Subjective:    Patient ID: [name], male    DOB: 24-Aug-1940, 83 y.o.   MRN: [id number]  Chief Complaint  Patient presents with   Medical Management of Chronic Issues   Pt presents to the office today for CPE and  chronic follow up. PT is followed by Cardiologists for CAD, CHF, Atherosclerosis, Cardiomyopathy, and Ventricular tachycardia every 2 years.  PT has pacemaker and is followed annually for this.     Pt is followed by Dermatologists for psoriasis as needed.     Has OSA and uses CPAP nightly.    He  reports he is drinking two beers a day. He reports constant burning and aching pain of bilateral feet from alocholic peripheral neuropathy that is 8 out 10. He usually takes Lyrica 75 mg BID, but has been out of this medication for three weeks.     He has aortic atheroscleroses and takes pravastatin daily.   He had a CVA in 05/13/22, with mild left sided weakness. He is doing PT. This has greatly improved.    He has emphysema. Smoking less 1/2 a pack a day. Denies any SOB.   He has taken his demadex 10 mg daily.     10/03/2023   11:21 AM 09/18/2023    2:18 PM 08/30/2023   11:10 AM  Last 3 Weights  Weight (lbs) 143 lb 3.2 oz 138 lb 143 lb  Weight (kg) 64.955 kg 62.596 kg 64.864 kg      Hypertension This is a chronic problem. The current episode started more than 1 year ago. The problem has been waxing and waning since onset. The problem is uncontrolled. Associated symptoms include malaise/fatigue. Pertinent negatives include no peripheral edema or shortness of breath. Risk factors for coronary artery disease include dyslipidemia, male gender, sedentary lifestyle and smoking/tobacco exposure. The current treatment provides moderate improvement.  Congestive Heart Failure Presents for follow-up visit. Associated symptoms include fatigue and nocturia (1). Pertinent negatives include no edema or shortness of breath.  Gastroesophageal Reflux He complains of belching and  heartburn. This is a chronic problem. The current episode started more than 1 year ago. The problem occurs occasionally. The symptoms are aggravated by certain foods. Associated symptoms include fatigue. He has tried a PPI for the symptoms. The treatment provided moderate relief.  Benign Prostatic Hypertrophy This is a chronic problem. The current episode started more than 1 year ago. Irritative symptoms include nocturia (1). Obstructive symptoms include an intermittent stream, straining and a weak stream. Past treatments include tamsulosin. The treatment provided moderate relief.  Nicotine Dependence Presents for follow-up visit. Symptoms include fatigue. The symptoms have been stable. He smokes < 1/2 a pack of cigarettes per day.      Review of Systems  Constitutional:  Positive for fatigue and malaise/fatigue.  Respiratory:  Negative for shortness of breath.   Gastrointestinal:  Positive for heartburn.  Genitourinary:  Positive for nocturia (1).  All other systems reviewed and are negative.      Objective:   Physical Exam Vitals reviewed.  Constitutional:      General: He is not in acute distress.    Appearance: He is well-developed.  HENT:     Head: Normocephalic.     Right Ear: Tympanic membrane normal.     Left Ear: Tympanic membrane normal.  Eyes:     General:        Right eye: No discharge.        Left eye: No discharge.  Pupils: Pupils are equal, round, and reactive to light.  Neck:     Thyroid: No thyromegaly.  Cardiovascular:     Rate and Rhythm: Normal rate and regular rhythm.     Heart sounds: Normal heart sounds. No murmur heard. Pulmonary:     Effort: Pulmonary effort is normal. No respiratory distress.     Breath sounds: Normal breath sounds. No wheezing.  Abdominal:     General: Bowel sounds are normal. There is no distension.     Palpations: Abdomen is soft.     Tenderness: There is no abdominal tenderness.  Musculoskeletal:        General: No  tenderness. Normal range of motion.     Cervical back: Normal range of motion and neck supple.  Skin:    General: Skin is warm and dry.     Coloration: Skin is not pale.     Findings: No erythema or rash.  Neurological:     Mental Status: He is alert and oriented to person, place, and time.     Cranial Nerves: No cranial nerve deficit.     Motor: Weakness present.     Gait: Gait abnormal (using cane).     Deep Tendon Reflexes: Reflexes are normal and symmetric.  Psychiatric:        Behavior: Behavior normal.        Thought Content: Thought content normal.        Judgment: Judgment normal.       BP (!) 149/67   Pulse 70   Temp (!) 97.2 F (36.2 C) (Temporal)   Ht 5\' 8"  (1.727 m)   Wt 143 lb 3.2 oz (65 kg)   SpO2 98%   BMI 21.77 kg/m      Assessment & Plan:  FOY VANDUYNE comes in today with chief complaint of Medical Management of Chronic Issues   Diagnosis and orders addressed:  1. GAD (generalized anxiety disorder) - ALPRAZolam (XANAX) 1 MG tablet; TAKE 1 TABLET BY MOUTH TWICE DAILY . APPOINTMENT REQUIRED FOR FUTURE REFILLS  Dispense: 60 tablet; Refill: 2 - CBC with Differential/Platelet - CMP14+EGFR  2. Essential hypertension - lisinopril (ZESTRIL) 40 MG tablet; Take 1 tablet (40 mg total) by mouth daily.  Dispense: 90 tablet; Refill: 0 - metoprolol tartrate (LOPRESSOR) 100 MG tablet; Take 1 tablet (100 mg total) by mouth 2 (two) times daily.  Dispense: 180 tablet; Refill: 0 - CBC with Differential/Platelet - CMP14+EGFR  3. Gastroesophageal reflux disease, unspecified whether esophagitis present - pantoprazole (PROTONIX) 40 MG tablet; Take 1 tablet (40 mg total) by mouth daily.  Dispense: 90 tablet; Refill: 1 - CBC with Differential/Platelet - CMP14+EGFR  4. Benign prostatic hyperplasia with incomplete bladder emptying - tamsulosin (FLOMAX) 0.4 MG CAPS capsule; Take 1 capsule (0.4 mg total) by mouth daily.  Dispense: 90 capsule; Refill: 0 - CBC with  Differential/Platelet - CMP14+EGFR  5. Annual physical exam (Primary)  - CBC with Differential/Platelet - CMP14+EGFR - Lipid panel - TSH - Vitamin B12  6. Hyperlipidemia, unspecified hyperlipidemia type  - CBC with Differential/Platelet - CMP14+EGFR - TSH  7. Alcohol abuse, in remission - CBC with Differential/Platelet - CMP14+EGFR - TSH - Vitamin B12  8. Atherosclerosis of native coronary artery with angina pectoris, unspecified whether native or transplanted heart (HCC) - CBC with Differential/Platelet - CMP14+EGFR  9. Insomnia, unspecified type - CBC with Differential/Platelet - CMP14+EGFR  10. TOBACCO ABUSE - CBC with Differential/Platelet - CMP14+EGFR  11. Thoracic aorta atherosclerosis (HCC)  - CBC  with Differential/Platelet - CMP14+EGFR  12. Alcoholic peripheral neuropathy (HCC) Restart lyrica 75 mg BID Limit alcohol  - pregabalin (LYRICA) 75 MG capsule; Take 1 capsule (75 mg total) by mouth 2 (two) times daily.  Dispense: 180 capsule; Refill: 0 - CBC with Differential/Platelet - CMP14+EGFR  13. Controlled substance agreement signed - [employer] Select 13 (MW), Urine - CBC with Differential/Platelet - CMP14+EGFR  14. Benzodiazepine dependence (HCC) - ToxASSURE Select 13 (MW), Urine - CBC with Differential/Platelet - CMP14+EGFR  15. Late effect of cerebrovascular accident (CVA) - CBC with Differential/Platelet - CMP14+EGFR   Labs pending Continue current medications  Patient reviewed in [place] controlled database, no flags noted. Contract and drug screen are up to date.  Restart Lyrica 75 mg BID Health Maintenance reviewed Diet and exercise encouraged  Follow up plan: 3 months   Tommas Fragmin, FNP

## 2023-10-03 NOTE — Patient Instructions (Signed)
 Neuropathic Pain Neuropathic pain is pain caused by damage to the nerves that are responsible for certain sensations in your body (sensory nerves). Neuropathic pain can make you more sensitive to pain. Even a minor sensation can feel very painful. This is usually a long-term (chronic) condition that can be difficult to treat. The type of pain differs from person to person. It may: Start suddenly (acute), or it may develop slowly and become chronic. Come and go as damaged nerves heal, or it may stay at the same level for years. Cause emotional distress, loss of sleep, and a lower quality of life. What are the causes? The most common cause of this condition is diabetes. Many other diseases and conditions can also cause neuropathic pain. Causes of neuropathic pain can be classified as: Toxic. This is caused by medicines and chemicals. The most common causes of toxic neuropathic pain is damage from medicines that kill cancer cells (chemotherapy) or alcohol abuse. Metabolic. This can be caused by: Diabetes. Lack of vitamins like B12. Traumatic. Any injury that cuts, crushes, or stretches a nerve can cause damage and pain. Compression-related. If a sensory nerve gets trapped or compressed for a long period of time, the blood supply to the nerve can be cut off. Vascular. Many blood vessel diseases can cause neuropathic pain by decreasing blood supply and oxygen to nerves. Autoimmune. This type of pain results from diseases in which the body's defense system (immune system) mistakenly attacks sensory nerves. Examples of autoimmune diseases that can cause neuropathic pain include lupus and multiple sclerosis. Infectious. Many types of viral infections can damage sensory nerves and cause pain. Shingles infection is a common cause of this type of pain. Inherited. Neuropathic pain can be a symptom of many diseases that are passed down through families (genetic). What increases the risk? You are more likely to  develop this condition if: You have diabetes. You smoke. You drink too much alcohol. You are taking certain medicines, including chemotherapy or medicines that treat immune system disorders. What are the signs or symptoms? The main symptom is pain. Neuropathic pain is often described as: Burning. Shock-like. Stinging. Hot or cold. Itching. How is this diagnosed? No single test can diagnose neuropathic pain. It is diagnosed based on: A physical exam and your symptoms. Your health care provider will ask you about your pain. You may be asked to use a pain scale to describe how bad your pain is. Tests. These may be done to see if you have a cause and location of any nerve damage. They include: Nerve conduction studies and electromyography to test how well nerve signals travel through your nerves and muscles (electrodiagnostic testing). Skin biopsy to evaluate for small fiber neuropathy. Imaging studies, such as: X-rays. CT scan. MRI. How is this treated? Treatment for neuropathic pain may change over time. You may need to try different treatment options or a combination of treatments. Some options include: Treating the underlying cause of the neuropathy, such as diabetes, kidney disease, or vitamin deficiencies. Stopping medicines that can cause neuropathy, such as chemotherapy. Medicine to relieve pain. Medicines may include: Prescription or over-the-counter pain medicine. Anti-seizure medicine. Antidepressant medicines. Pain-relieving patches or creams that are applied to painful areas of skin. A medicine to numb the area (local anesthetic), which can be injected as a nerve block. Transcutaneous nerve stimulation. This uses electrical currents to block painful nerve signals. The treatment is painless. Alternative treatments, such as: Acupuncture. Meditation. Massage. Occupational or physical therapy. Pain management programs. Counseling. Follow  these instructions at  home: Medicines  Take over-the-counter and prescription medicines only as told by your health care provider. Ask your health care provider if the medicine prescribed to you: Requires you to avoid driving or using machinery. Can cause constipation. You may need to take these actions to prevent or treat constipation: Drink enough fluid to keep your urine pale yellow. Take over-the-counter or prescription medicines. Eat foods that are high in fiber, such as beans, whole grains, and fresh fruits and vegetables. Limit foods that are high in fat and processed sugars, such as fried or sweet foods. Lifestyle  Have a good support system at home. Consider joining a chronic pain support group. Do not use any products that contain nicotine or tobacco. These products include cigarettes, chewing tobacco, and vaping devices, such as e-cigarettes. If you need help quitting, ask your health care provider. Do not drink alcohol. General instructions Learn as much as you can about your condition. Work closely with all your health care providers to find the treatment plan that works best for you. Ask your health care provider what activities are safe for you. Keep all follow-up visits. This is important. Contact a health care provider if: Your pain treatments are not working. You are having side effects from your medicines. You are struggling with tiredness (fatigue), mood changes, depression, or anxiety. Get help right away if: You have thoughts of hurting yourself. Get help right away if you feel like you may hurt yourself or others, or have thoughts about taking your own life. Go to your nearest emergency room or: Call 911. Call the National Suicide Prevention Lifeline at 947-472-5826 or 988. This is open 24 hours a day. Text the Crisis Text Line at 657-070-3362. Summary Neuropathic pain is pain caused by damage to the nerves that are responsible for certain sensations in your body (sensory  nerves). Neuropathic pain may come and go as damaged nerves heal, or it may stay at the same level for years. Neuropathic pain is usually a long-term condition that can be difficult to treat. Consider joining a chronic pain support group. This information is not intended to replace advice given to you by your health care provider. Make sure you discuss any questions you have with your health care provider. Document Revised: 02/01/2021 Document Reviewed: 02/01/2021 Elsevier Patient Education  2024 [employer].

## 2023-10-04 LAB — CBC WITH DIFFERENTIAL/PLATELET
Basophils Absolute: 0.1 10*3/uL (ref 0.0–0.2)
Basos: 1 %
EOS (ABSOLUTE): 0.2 10*3/uL (ref 0.0–0.4)
Eos: 2 %
Hematocrit: 36.1 % — ABNORMAL LOW (ref 37.5–51.0)
Hemoglobin: 12 g/dL — ABNORMAL LOW (ref 13.0–17.7)
Immature Grans (Abs): 0 10*3/uL (ref 0.0–0.1)
Immature Granulocytes: 0 %
Lymphocytes Absolute: 2 10*3/uL (ref 0.7–3.1)
Lymphs: 25 %
MCH: 32.4 pg (ref 26.6–33.0)
MCHC: 33.2 g/dL (ref 31.5–35.7)
MCV: 98 fL — ABNORMAL HIGH (ref 79–97)
Monocytes Absolute: 0.9 10*3/uL (ref 0.1–0.9)
Monocytes: 11 %
Neutrophils Absolute: 4.8 10*3/uL (ref 1.4–7.0)
Neutrophils: 61 %
Platelets: 277 10*3/uL (ref 150–450)
RBC: 3.7 x10E6/uL — ABNORMAL LOW (ref 4.14–5.80)
RDW: 12.1 % (ref 11.6–15.4)
WBC: 7.9 10*3/uL (ref 3.4–10.8)

## 2023-10-04 LAB — CMP14+EGFR
ALT: 19 IU/L (ref 0–44)
AST: 31 IU/L (ref 0–40)
Albumin: 4.7 g/dL (ref 3.8–4.8)
Alkaline Phosphatase: 144 IU/L — ABNORMAL HIGH (ref 44–121)
BUN/Creatinine Ratio: 13 (ref 10–24)
BUN: 17 mg/dL (ref 8–27)
Bilirubin Total: 0.4 mg/dL (ref 0.0–1.2)
CO2: 21 mmol/L (ref 20–29)
Calcium: 9.8 mg/dL (ref 8.6–10.2)
Chloride: 96 mmol/L (ref 96–106)
Creatinine, Ser: 1.32 mg/dL — ABNORMAL HIGH (ref 0.76–1.27)
Globulin, Total: 3.1 g/dL (ref 1.5–4.5)
Glucose: 76 mg/dL (ref 70–99)
Potassium: 4.9 mmol/L (ref 3.5–5.2)
Sodium: 133 mmol/L — ABNORMAL LOW (ref 134–144)
Total Protein: 7.8 g/dL (ref 6.0–8.5)
eGFR: 55 mL/min/{1.73_m2} — ABNORMAL LOW (ref >59)

## 2023-10-04 LAB — LIPID PANEL
Cholesterol, Total: 167 mg/dL (ref 100–199)
HDL: 50 mg/dL (ref >39)
LDL CALC COMMENT:: 3.3 ratio (ref 0.0–5.0)
LDL Chol Calc (NIH): 88 mg/dL (ref 0–99)
Triglycerides: 169 mg/dL — ABNORMAL HIGH (ref 0–149)
VLDL Cholesterol Cal: 29 mg/dL (ref 5–40)

## 2023-10-04 LAB — TSH: TSH: 1.36 u[IU]/mL (ref 0.450–4.500)

## 2023-10-04 LAB — VITAMIN B12

## 2023-10-05 ENCOUNTER — Ambulatory Visit: Attending: Internal Medicine

## 2023-10-05 DIAGNOSIS — I447 Left bundle-branch block, unspecified: Secondary | ICD-10-CM

## 2023-10-05 DIAGNOSIS — I428 Other cardiomyopathies: Secondary | ICD-10-CM

## 2023-10-05 DIAGNOSIS — I5022 Chronic systolic (congestive) heart failure: Secondary | ICD-10-CM

## 2023-10-05 LAB — CUP PACEART INCLINIC DEVICE CHECK
Date Time Interrogation Session: 20250417172131
Implantable Lead Connection Status: 753985
Implantable Lead Connection Status: 753985
Implantable Lead Connection Status: 753985
Implantable Lead Implant Date: 20040312
Implantable Lead Implant Date: 20040312
Implantable Lead Implant Date: 20040312
Implantable Lead Location: 753858
Implantable Lead Location: 753859
Implantable Lead Location: 753860
Implantable Lead Model: 158
Implantable Lead Model: 4087
Implantable Lead Model: 4513
Implantable Lead Serial Number: 129158
Implantable Lead Serial Number: 209446
Implantable Lead Serial Number: 407264
Implantable Pulse Generator Implant Date: 20250331
Pulse Gen Serial Number: 107566

## 2023-10-05 LAB — TOXASSURE SELECT 13 (MW), URINE

## 2023-10-05 NOTE — Patient Instructions (Signed)

## 2023-10-05 NOTE — Progress Notes (Signed)
 Abnormal in-clinic CRT-D (multi-lead) device and wound check after ICD generator change out. Presenting Rhythm: Afib/BiVP . Routine testing was performed. Thresholds, sensing, impedance trend were stable and no changes were required. HF diagnostics are stable. No episodes. No treated arrhythmias. Patient BiV pacing 99% of the time. Estimated longevity 9.5 yrs. No further arm restrictions at this time. Pt enrolled in remote follow-up. A-fib burden 100%  Pocket hematoma with bruising noted overtop device. Dr. Carolynne Citron to clinic room to assess. Per MD, patient to continue taking Eliquis. If site worsens, stop taking Eliquis immediately and call the device clinic. Patient and family member in the room acknowledged undestanding of the education provided by Dr. Carolynne Citron.

## 2023-10-19 ENCOUNTER — Encounter: Payer: Self-pay | Admitting: Cardiovascular Disease

## 2023-11-03 ENCOUNTER — Ambulatory Visit: Payer: Self-pay | Admitting: Cardiovascular Disease

## 2023-11-03 ENCOUNTER — Ambulatory Visit (INDEPENDENT_AMBULATORY_CARE_PROVIDER_SITE_OTHER)

## 2023-11-03 DIAGNOSIS — I447 Left bundle-branch block, unspecified: Secondary | ICD-10-CM

## 2023-11-03 LAB — CUP PACEART REMOTE DEVICE CHECK
Battery Remaining Longevity: 102 mo
Battery Remaining Percentage: 100 %
Brady Statistic RA Percent Paced: 0 %
Brady Statistic RV Percent Paced: 100 %
Date Time Interrogation Session: 20250516004100
HighPow Impedance: 44 Ohm
Implantable Lead Connection Status: 753985
Implantable Lead Connection Status: 753985
Implantable Lead Connection Status: 753985
Implantable Lead Implant Date: 20040312
Implantable Lead Implant Date: 20040312
Implantable Lead Implant Date: 20040312
Implantable Lead Location: 753858
Implantable Lead Location: 753859
Implantable Lead Location: 753860
Implantable Lead Model: 158
Implantable Lead Model: 4087
Implantable Lead Model: 4513
Implantable Lead Serial Number: 129158
Implantable Lead Serial Number: 209446
Implantable Lead Serial Number: 407264
Implantable Pulse Generator Implant Date: 20250331
Lead Channel Impedance Value: 1092 Ohm
Lead Channel Impedance Value: 478 Ohm
Lead Channel Impedance Value: 625 Ohm
Lead Channel Pacing Threshold Amplitude: 0.8 V
Lead Channel Pacing Threshold Pulse Width: 0.4 ms
Lead Channel Setting Pacing Amplitude: 2.5 V
Lead Channel Setting Pacing Amplitude: 3.3 V
Lead Channel Setting Pacing Amplitude: 3.5 V
Lead Channel Setting Pacing Pulse Width: 0.4 ms
Lead Channel Setting Pacing Pulse Width: 0.8 ms
Lead Channel Setting Sensing Sensitivity: 0.5 mV
Lead Channel Setting Sensing Sensitivity: 1 mV
Pulse Gen Serial Number: 107566

## 2023-11-16 NOTE — Progress Notes (Signed)
 This encounter was created in error - please disregard. spoke w/pt's daughter, will need to reschedule (03/04/24/ at 3:50pm)due pt's care giver (daughter-linda) is a work. will need to do awv w/her assistance. orgin. 11/16/23--Alia T/cma

## 2023-11-22 DIAGNOSIS — I10 Essential (primary) hypertension: Secondary | ICD-10-CM

## 2023-11-22 DIAGNOSIS — K219 Gastro-esophageal reflux disease without esophagitis: Secondary | ICD-10-CM

## 2023-11-22 DIAGNOSIS — N401 Enlarged prostate with lower urinary tract symptoms: Secondary | ICD-10-CM

## 2023-11-23 MED ORDER — TAMSULOSIN HCL 0.4 MG PO CAPS: 0.4000 mg | ORAL_CAPSULE | Freq: Every day | ORAL | 3 refills | Status: DC

## 2023-11-23 MED ORDER — PRAVASTATIN SODIUM 80 MG PO TABS: 80.0000 mg | ORAL_TABLET | Freq: Every evening | ORAL | 3 refills | Status: AC

## 2023-11-23 MED ORDER — LISINOPRIL 40 MG PO TABS: 40.0000 mg | ORAL_TABLET | Freq: Every day | ORAL | 3 refills | Status: DC

## 2023-11-23 MED ORDER — PANTOPRAZOLE SODIUM 40 MG PO TBEC: 40.0000 mg | DELAYED_RELEASE_TABLET | Freq: Every day | ORAL | 3 refills | Status: DC

## 2023-11-23 MED ORDER — METOPROLOL TARTRATE 100 MG PO TABS
100.0000 mg | ORAL_TABLET | Freq: Two times a day (BID) | ORAL | 3 refills | Status: DC
Start: 2023-11-23 — End: 2024-02-27

## 2023-11-27 ENCOUNTER — Encounter

## 2023-12-07 NOTE — Progress Notes (Signed)
 Remote ICD transmission.

## 2023-12-11 ENCOUNTER — Encounter: Admitting: Cardiovascular Disease

## 2023-12-20 ENCOUNTER — Encounter: Admitting: Cardiovascular Disease

## 2023-12-21 DIAGNOSIS — I639 Cerebral infarction, unspecified: Secondary | ICD-10-CM | POA: Diagnosis not present

## 2023-12-21 DIAGNOSIS — Z951 Presence of aortocoronary bypass graft: Secondary | ICD-10-CM | POA: Diagnosis not present

## 2023-12-21 DIAGNOSIS — Z886 Allergy status to analgesic agent status: Secondary | ICD-10-CM | POA: Diagnosis not present

## 2023-12-21 DIAGNOSIS — J9601 Acute respiratory failure with hypoxia: Secondary | ICD-10-CM | POA: Diagnosis not present

## 2023-12-21 DIAGNOSIS — Z79899 Other long term (current) drug therapy: Secondary | ICD-10-CM | POA: Diagnosis not present

## 2023-12-21 DIAGNOSIS — Z7951 Long term (current) use of inhaled steroids: Secondary | ICD-10-CM | POA: Diagnosis not present

## 2023-12-21 DIAGNOSIS — R918 Other nonspecific abnormal finding of lung field: Secondary | ICD-10-CM | POA: Diagnosis not present

## 2023-12-21 DIAGNOSIS — I5022 Chronic systolic (congestive) heart failure: Secondary | ICD-10-CM | POA: Diagnosis not present

## 2023-12-21 DIAGNOSIS — I4821 Permanent atrial fibrillation: Secondary | ICD-10-CM | POA: Diagnosis not present

## 2023-12-21 DIAGNOSIS — Z7901 Long term (current) use of anticoagulants: Secondary | ICD-10-CM | POA: Diagnosis not present

## 2023-12-21 DIAGNOSIS — Z9981 Dependence on supplemental oxygen: Secondary | ICD-10-CM | POA: Diagnosis not present

## 2023-12-21 DIAGNOSIS — I509 Heart failure, unspecified: Secondary | ICD-10-CM | POA: Diagnosis not present

## 2023-12-21 DIAGNOSIS — F17210 Nicotine dependence, cigarettes, uncomplicated: Secondary | ICD-10-CM | POA: Diagnosis not present

## 2023-12-21 DIAGNOSIS — Z888 Allergy status to other drugs, medicaments and biological substances status: Secondary | ICD-10-CM | POA: Diagnosis not present

## 2023-12-21 DIAGNOSIS — R0902 Hypoxemia: Secondary | ICD-10-CM | POA: Diagnosis not present

## 2023-12-21 DIAGNOSIS — I5020 Unspecified systolic (congestive) heart failure: Secondary | ICD-10-CM | POA: Diagnosis not present

## 2023-12-21 DIAGNOSIS — Z881 Allergy status to other antibiotic agents status: Secondary | ICD-10-CM | POA: Diagnosis not present

## 2023-12-21 DIAGNOSIS — Z950 Presence of cardiac pacemaker: Secondary | ICD-10-CM | POA: Diagnosis not present

## 2023-12-21 DIAGNOSIS — A419 Sepsis, unspecified organism: Secondary | ICD-10-CM | POA: Diagnosis not present

## 2023-12-21 DIAGNOSIS — Z1152 Encounter for screening for COVID-19: Secondary | ICD-10-CM | POA: Diagnosis not present

## 2023-12-21 DIAGNOSIS — R0602 Shortness of breath: Secondary | ICD-10-CM | POA: Diagnosis not present

## 2023-12-21 DIAGNOSIS — Z8673 Personal history of transient ischemic attack (TIA), and cerebral infarction without residual deficits: Secondary | ICD-10-CM | POA: Diagnosis not present

## 2023-12-25 ENCOUNTER — Telehealth: Payer: Self-pay | Admitting: *Deleted

## 2023-12-25 ENCOUNTER — Encounter

## 2023-12-25 NOTE — Transitions of Care (Post Inpatient/ED Visit) (Signed)
 12/25/2023  Name: [name] MRN: [id number] DOB: 1940-09-14  Today's TOC FU Call Status: Today's TOC FU Call Status:: Successful TOC FU Call Completed TOC FU Call Complete Date: 12/25/23 Patient's Name and Date of Birth confirmed.  Transition Care Management Follow-up Telephone Call Date of Discharge: 12/23/23 Discharge Facility: Other [facility]) Name of Other (Non[facility]) Discharge Facility: [facility] Type of Discharge: Inpatient Admission Primary Inpatient Discharge Diagnosis:: Diagnoses   Sepsis, due to unspecified organism, unspecified whether acute organ dysfunction present    Hypoxia   Acute on chronic hypoxic respiratory failure How have you been since you were released from the hospital?:  (eating, drinking well, ambulating with cane, lives with daughter, no issues with bowel/ bladder) Any questions or concerns?: No  Items Reviewed: Did you receive and understand the discharge instructions provided?: Yes Medications obtained,verified, and reconciled?: Yes (Medications Reviewed) Any new allergies since your discharge?: No Dietary orders reviewed?: Yes Type of Diet Ordered:: heart healthy Do you have support at home?: Yes People in Home [RPT]: child(ren), adult Name of Support/Comfort Primary Source: [place]  DPR Reviewed HF action plan, importance of daily weights and keeping a log Reviewed signs/ symptoms of infection Reviewed oxygen safety  Reviewed if home health does not contact within 1-2 days, let RN CM and primary care provider know, gave RN CM contact information  Medications Reviewed Today: Medications Reviewed Today     Reviewed by Aura Mliss LABOR, RN (Registered Nurse) on 12/25/23 at 1552  Med List Status: <None>   Medication Order Taking? Sig Documenting Provider Last Dose Status Informant  ALPRAZolam  (XANAX ) 1 MG tablet [id number] Yes TAKE 1 TABLET BY MOUTH TWICE DAILY . APPOINTMENT REQUIRED FOR FUTURE REFILLS Lavell Bari LABOR, FNP   Active   amLODipine  (NORVASC ) 2.5 MG tablet [id number] Yes Take 1 tablet (2.5 mg total) by mouth daily. Lavona Agent, MD  Active Family Member  apixaban  (ELIQUIS ) 5 MG TABS tablet [id number] Yes Take 1 tablet by mouth twice daily Lavona Agent, MD  Active Family Member  cefdinir (OMNICEF) 300 MG capsule [id number] Yes Take 300 mg by mouth 2 (two) times daily. [provider]  Active   cetirizine  (ZYRTEC ) 10 MG tablet [id number] Yes Take 10 mg by mouth at bedtime. [provider]  Active Family Member  doxycycline  (ADOXA) 100 MG tablet [id number] Yes Take 100 mg by mouth 2 (two) times daily. [provider]  Active   lisinopril  (ZESTRIL ) 40 MG tablet [id number] Yes Take 1 tablet (40 mg total) by mouth daily. Lavell Bari A, FNP  Active   metoprolol  tartrate (LOPRESSOR ) 100 MG tablet [id number] Yes Take 1 tablet (100 mg total) by mouth 2 (two) times daily. Lavell Bari LABOR, FNP  Active   Multiple Vitamins-Minerals (MULTIVITAMIN WITH MINERALS) tablet [id number] Yes Take 1 tablet by mouth at bedtime. [provider]  Active Family Member  pantoprazole  (PROTONIX ) 40 MG tablet [id number] Yes Take 1 tablet (40 mg total) by mouth daily. Lavell Bari A, FNP  Active   pravastatin  (PRAVACHOL ) 80 MG tablet [id number] Yes Take 1 tablet (80 mg total) by mouth every evening. Lavell Bari A, FNP  Active   pregabalin  (LYRICA ) 75 MG capsule [id number] Yes Take 1 capsule (75 mg total) by mouth 2 (two) times daily. Lavell Bari A, FNP  Active   tamsulosin  (FLOMAX ) 0.4 MG CAPS capsule [id number] Yes Take 1 capsule (0.4 mg total) by mouth daily. Lavell Bari A, FNP  Active   torsemide  (DEMADEX ) 20 MG tablet [id number]  Yes Take 10 mg by mouth daily. May take an additional 10 mg if needed for fluid [provider]  Active Family Member            Home Care and Equipment/Supplies: Were Home Health Services Ordered?: Yes Name of Home Health Agency:: daughter is unsure of  agency name, states  they said they would call me by Tuesday 7/8  if daughter does not hear from anyone by 7/8, she will let RN CM know Has Agency set up a time to come to your home?: No EMR reviewed for Home Health Orders:  (unsure name of agency, daughter will let RN CM and provider know if she does not hear from agency in the next 2 days) Any new equipment or medical supplies ordered?: Yes Name of Medical supply agency?: oxygen @ 2 liters prn       [employer] Were you able to get the equipment/medical supplies?: Yes Do you have any questions related to the use of the equipment/supplies?: No  Functional Questionnaire: Do you need assistance with bathing/showering or dressing?: No Do you need assistance with meal preparation?: No Do you need assistance with eating?: No Do you have difficulty maintaining continence: Yes (Depends) Do you need assistance with getting out of bed/getting out of a chair/moving?: Yes (use cane,   has walker if needed) Do you have difficulty managing or taking your medications?: Yes (daughter provides oversight)  Follow up appointments reviewed: PCP Follow-up appointment confirmed?: Yes Date of PCP follow-up appointment?: 01/02/24 Follow-up Provider: Bari Learn  @ 940 am [facility] Follow-up appointment confirmed?: NA Do you need transportation to your follow-up appointment?: No Do you understand care options if your condition(s) worsen?: Yes-patient verbalized understanding  SDOH Interventions Today    Flowsheet Row Most Recent Value  SDOH Interventions   Food Insecurity Interventions Intervention Not Indicated  Housing Interventions Intervention Not Indicated  Transportation Interventions Intervention Not Indicated  Utilities Interventions Intervention Not Indicated    Mliss [facility], BSN RN Care Manager/ Transition of Care [place] [phone]

## 2023-12-26 LAB — LAB REPORT - SCANNED
A1c: 6.2
Creatinine, POC: 81.6 mg/dL
EGFR: 48
Microalb Creat Ratio: 33.1
Microalbumin, Urine: 2.7
TSH: 0.61 (ref 0.41–5.90)

## 2023-12-28 ENCOUNTER — Telehealth: Payer: Self-pay | Admitting: Family

## 2023-12-28 NOTE — Telephone Encounter (Signed)
 Copied from CRM [id number]. Topic: General - Other >> Dec 28, 2023  3:55 PM Avram MATSU wrote: Reason for CRM: Patient will start services with [facility] on 7/19 kara wanted to inform the provide.

## 2024-01-02 ENCOUNTER — Ambulatory Visit: Admitting: Family

## 2024-01-02 ENCOUNTER — Encounter: Payer: Self-pay | Admitting: Family

## 2024-01-02 ENCOUNTER — Ambulatory Visit (INDEPENDENT_AMBULATORY_CARE_PROVIDER_SITE_OTHER)

## 2024-01-02 VITALS — BP 109/62 | HR 70 | Temp 97.9°F | Ht 68.0 in | Wt 142.6 lb

## 2024-01-02 DIAGNOSIS — N401 Enlarged prostate with lower urinary tract symptoms: Secondary | ICD-10-CM

## 2024-01-02 DIAGNOSIS — Z79899 Other long term (current) drug therapy: Secondary | ICD-10-CM

## 2024-01-02 DIAGNOSIS — I6930 Unspecified sequelae of cerebral infarction: Secondary | ICD-10-CM | POA: Diagnosis not present

## 2024-01-02 DIAGNOSIS — I509 Heart failure, unspecified: Secondary | ICD-10-CM

## 2024-01-02 DIAGNOSIS — Z09 Encounter for follow-up examination after completed treatment for conditions other than malignant neoplasm: Secondary | ICD-10-CM | POA: Diagnosis not present

## 2024-01-02 DIAGNOSIS — D6869 Other thrombophilia: Secondary | ICD-10-CM

## 2024-01-02 DIAGNOSIS — G4733 Obstructive sleep apnea (adult) (pediatric): Secondary | ICD-10-CM

## 2024-01-02 DIAGNOSIS — I480 Paroxysmal atrial fibrillation: Secondary | ICD-10-CM | POA: Diagnosis not present

## 2024-01-02 DIAGNOSIS — F411 Generalized anxiety disorder: Secondary | ICD-10-CM | POA: Diagnosis not present

## 2024-01-02 DIAGNOSIS — N1831 Chronic kidney disease, stage 3a: Secondary | ICD-10-CM | POA: Diagnosis not present

## 2024-01-02 DIAGNOSIS — J432 Centrilobular emphysema: Secondary | ICD-10-CM

## 2024-01-02 DIAGNOSIS — I700 Atherosclerosis of aorta: Secondary | ICD-10-CM | POA: Diagnosis not present

## 2024-01-02 DIAGNOSIS — G621 Alcoholic polyneuropathy: Secondary | ICD-10-CM

## 2024-01-02 DIAGNOSIS — J189 Pneumonia, unspecified organism: Secondary | ICD-10-CM

## 2024-01-02 DIAGNOSIS — I10 Essential (primary) hypertension: Secondary | ICD-10-CM

## 2024-01-02 DIAGNOSIS — K219 Gastro-esophageal reflux disease without esophagitis: Secondary | ICD-10-CM

## 2024-01-02 DIAGNOSIS — F1320 Sedative, hypnotic or anxiolytic dependence, uncomplicated: Secondary | ICD-10-CM

## 2024-01-02 MED ORDER — PREGABALIN 75 MG PO CAPS: 75.0000 mg | ORAL_CAPSULE | Freq: Two times a day (BID) | ORAL | 0 refills | Status: DC

## 2024-01-02 MED ORDER — ALPRAZOLAM 1 MG PO TABS: ORAL_TABLET | ORAL | 2 refills | Status: DC

## 2024-01-02 NOTE — Progress Notes (Signed)
 Subjective:    Patient ID: [name], male    DOB: 1941/06/17, 83 y.o.   MRN: [id number]  Chief Complaint  Patient presents with   Medical Management of Chronic Issues   Pt presents to the office today for chronic follow up. [facility] just [facility] 12/26/23. Will scan into EPIC.   PT is followed by Cardiologists for CAD, CHF, Atherosclerosis, Cardiomyopathy, and Ventricular tachycardia every 2 years.  PT has pacemaker and is followed annually for this.   Pt is followed by Dermatologists for psoriasis as needed.     Has OSA and uses CPAP nightly.    He  reports he is drinking three beers a day. He reports constant burning and aching pain of bilateral feet from alocholic peripheral neuropathy that is 7-8 out 10. He usually takes Lyrica  75 mg BID.    He has aortic atheroscleroses and takes pravastatin  daily.   He had a CVA in 05/13/22, with mild left sided weakness. He is doing PT. This has greatly improved.    He has emphysema. Smoking less 1/2 a pack a day. Denies any SOB.   He has taken his demadex  10 mg daily.     01/02/2024    9:39 AM 12/25/2023    3:56 PM 10/03/2023   11:21 AM  Last 3 Weights  Weight (lbs) 142 lb 9.6 oz 145 lb 143 lb 3.2 oz  Weight (kg) 64.683 kg 65.772 kg 64.955 kg     He was hospitalized for CAP on 12/21/23. He was discharged on Omnicef and doxycycline  for the next 5 days. He has completed these. He was discharged with home O2. He does not have this on today, but wears it at home when needed.  Hypertension This is a chronic problem. The current episode started more than 1 year ago. The problem has been resolved since onset. The problem is controlled. Associated symptoms include malaise/fatigue and shortness of breath. Pertinent negatives include no peripheral edema. Risk factors for coronary artery disease include dyslipidemia, male gender, sedentary lifestyle and smoking/tobacco exposure. The current treatment provides  moderate improvement. Hypertensive end-organ damage includes CVA.  Congestive Heart Failure Presents for follow-up visit. Associated symptoms include fatigue, nocturia (2) and shortness of breath. Pertinent negatives include no edema. The symptoms have been stable.  Gastroesophageal Reflux He complains of belching and heartburn. This is a chronic problem. The current episode started more than 1 year ago. The problem occurs occasionally. The symptoms are aggravated by certain foods. Associated symptoms include fatigue. He has tried a PPI for the symptoms. The treatment provided moderate relief.  Benign Prostatic Hypertrophy This is a chronic problem. The current episode started more than 1 year ago. Irritative symptoms include nocturia (2). Obstructive symptoms include an intermittent stream, straining and a weak stream. Past treatments include tamsulosin . The treatment provided moderate relief.  Nicotine  Dependence Presents for follow-up visit. Symptoms include fatigue. The symptoms have been stable. He smokes < 1/2 a pack of cigarettes per day.      Review of Systems  Constitutional:  Positive for fatigue and malaise/fatigue.  Respiratory:  Positive for shortness of breath.   Gastrointestinal:  Positive for heartburn.  Genitourinary:  Positive for nocturia (2).  All other systems reviewed and are negative.      Objective:   Physical Exam Vitals reviewed.  Constitutional:      General: He is not in acute distress.    Appearance: He is well-developed.  HENT:     Head: Normocephalic.     Right Ear: Tympanic membrane normal.     Left Ear: Tympanic membrane normal.  Eyes:     General:        Right eye: No discharge.        Left eye: No discharge.     Pupils: Pupils are equal, round, and reactive to light.  Neck:     Thyroid : No thyromegaly.  Cardiovascular:     Rate and Rhythm: Normal rate and regular rhythm.     Heart sounds: Normal heart sounds. No murmur heard. Pulmonary:      Effort: Pulmonary effort is normal. No respiratory distress.     Breath sounds: Normal breath sounds. No wheezing.  Abdominal:     General: Bowel sounds are normal. There is no distension.     Palpations: Abdomen is soft.     Tenderness: There is no abdominal tenderness.  Musculoskeletal:        General: No tenderness. Normal range of motion.     Cervical back: Normal range of motion and neck supple.  Skin:    General: Skin is warm and dry.     Coloration: Skin is not pale.     Findings: No erythema or rash.  Neurological:     Mental Status: He is alert and oriented to person, place, and time.     Cranial Nerves: No cranial nerve deficit.     Motor: Weakness present.     Gait: Gait abnormal (using cane).     Deep Tendon Reflexes: Reflexes are normal and symmetric.  Psychiatric:        Behavior: Behavior normal.        Thought Content: Thought content normal.        Judgment: Judgment normal.       BP 109/62   Pulse 70   Temp 97.9 F (36.6 C) (Temporal)   Ht 5' 8 (1.727 m)   Wt 142 lb 9.6 oz (64.7 kg)   SpO2 96%   BMI 21.68 kg/m      Assessment & Plan:  [name] comes in today with chief complaint of Medical Management of Chronic Issues   Diagnosis and orders addressed:  1. GAD (generalized anxiety disorder) - ALPRAZolam  (XANAX ) 1 MG tablet; TAKE 1 TABLET BY MOUTH TWICE DAILY . APPOINTMENT REQUIRED FOR FUTURE REFILLS  Dispense: 60 tablet; Refill: 2  2. Alcoholic peripheral neuropathy (HCC) - pregabalin  (LYRICA ) 75 MG capsule; Take 1 capsule (75 mg total) by mouth 2 (two) times daily.  Dispense: 180 capsule; Refill: 0  3. CKD stage G3a/A1, GFR 45-59 and albumin creatinine ratio <30 mg/g (HCC) (Primary)  4. Acute on chronic congestive heart failure, unspecified heart failure type (HCC)  5. Benign prostatic hyperplasia (BPH) with straining on urination  6. Late effect of cerebrovascular accident (CVA)  7. OSA (obstructive sleep apnea)  8. Secondary  hypercoagulable state (HCC)  9. Paroxysmal atrial fibrillation (HCC)  10. Aortic atherosclerosis (HCC)  11. Centrilobular emphysema (HCC)  12. Controlled substance agreement signed  13. Benzodiazepine dependence (HCC)  14. Gastroesophageal reflux disease, unspecified whether esophagitis present  15. Essential hypertension  16. Hospital discharge follow-up  - DG Chest 2 View; Future  17. Community acquired pneumonia, unspecified laterality - DG Chest 2 View; Future    Labs reviewed from [facility] Chest x-ray pending Continue current medications  Patient reviewed in [place] controlled database, no flags noted. Contract and drug screen are up to date.  Health Maintenance reviewed Diet and exercise encouraged  Follow up plan: 3 months   Bari Learn, FNP

## 2024-01-02 NOTE — Patient Instructions (Signed)
 Community-Acquired Pneumonia, Adult Pneumonia is a lung infection that causes inflammation and the buildup of mucus and fluids in the lungs. This may cause coughing and difficulty breathing. Community-acquired pneumonia is pneumonia that develops in people who are not, and have not recently been, in a hospital or other health care facility. Usually, pneumonia develops as a result of an illness that is caused by a virus, such as the common cold and the flu (influenza). It can also be caused by bacteria or fungi. While the common cold and influenza can pass from person to person (are contagious), pneumonia itself is not considered contagious. What are the causes? This condition may be caused by: Viruses. Bacteria. Fungi. What increases the risk? The following factors may make you more likely to develop this condition: Being over age 40 or having certain medical conditions, such as: A long-term (chronic) disease, such as: chronic obstructive pulmonary disease (COPD), asthma, heart failure, diabetes, or kidney disease. A condition that increases the risk of breathing in (aspirating) mucus and other fluids from your mouth and nose. A weakened body defense system (immune system). Having had your spleen removed (splenectomy). The spleen is the organ that helps fight germs and infections. Not cleaning your teeth and gums well (poor dental hygiene). Using tobacco products. Traveling to places where germs that cause pneumonia are present or being near certain animals or animal habitats that could have germs that cause pneumonia. What are the signs or symptoms? Symptoms of this condition include: A dry cough or a wet (productive) cough. A fever, sweating, or chills. Chest pain, especially when breathing deeply or coughing. Fast breathing, difficulty breathing, or shortness of breath. Tiredness (fatigue) and muscle aches. How is this diagnosed? This condition may be diagnosed based on your medical  history or a physical exam. You may also have tests, including: Imaging, such as a chest X-ray or lung ultrasound. Tests of: The level of oxygen and other gases in your blood. Mucus from your lungs (sputum). Fluid around your lungs (pleural fluid). Your urine. How is this treated? Treatment for this condition depends on many factors, such as the cause of your pneumonia, your medicines, and other medical conditions that you have. For most adults, pneumonia may be treated at home. In some cases, treatment must happen in a hospital and may include: Medicines that are given by mouth (orally) or through an IV, including: Antibiotic medicines, if bacteria caused the pneumonia. Medicines that kill viruses (antiviral medicines), if a virus caused the pneumonia. Oxygen therapy. Severe pneumonia, although rare, may require the following treatments: Mechanical ventilation.This procedure uses a machine to help you breathe if you cannot breathe well on your own or maintain a safe level of blood oxygen. Thoracentesis. This procedure removes any buildup of pleural fluid to help with breathing. Follow these instructions at home:  Medicines Take over-the-counter and prescription medicines only as told by your health care provider. Take cough medicine only if you have trouble sleeping. Cough medicine can prevent your body from removing mucus from your lungs. If you were prescribed antibiotics, take them as told by your health care provider. Do not stop taking the antibiotic even if you start to feel better. Lifestyle     Do not drink alcohol. Do not use any products that contain nicotine or tobacco. These products include cigarettes, chewing tobacco, and vaping devices, such as e-cigarettes. If you need help quitting, ask your health care provider. Eat a healthy diet. This includes plenty of vegetables, fruits, whole grains, low-fat  dairy products, and lean protein. General instructions Rest a lot and  get at least 8 hours of sleep each night. Sleep in a partly upright position at night. Place a few pillows under your head or sleep in a reclining chair. Return to your normal activities as told by your health care provider. Ask your health care provider what activities are safe for you. Drink enough fluid to keep your urine pale yellow. This helps to thin the mucus in your lungs. If your throat is sore, gargle with a mixture of salt and water 3-4 times a day or as needed. To make salt water, completely dissolve -1 tsp (3-6 g) of salt in 1 cup (237 mL) of warm water. Keep all follow-up visits. How is this prevented? You can lower your risk of developing community-acquired pneumonia by: Getting the pneumonia vaccine. There are different types and schedules of pneumonia vaccines. Ask your health care provider which option is best for you. Consider getting the pneumonia vaccine if: You are older than 81 years of age. You are 67-15 years of age and are receiving cancer treatment, have chronic lung disease, or have other medical conditions that affect your immune system. Ask your health care provider if this applies to you. Getting your influenza vaccine every year. Ask your health care provider which type of vaccine is best for you. Getting regular dental checkups. Washing your hands often with soap and water for at least 20 seconds. If soap and water are not available, use hand sanitizer. Contact a health care provider if: You have a fever. You have trouble sleeping because you cannot control your cough with cough medicine. Get help right away if: Your shortness of breath becomes worse. Your chest pain increases. Your sickness becomes worse, especially if you are an older adult or have a weak immune system. You cough up blood. These symptoms may be an emergency. Get help right away. Call 911. Do not wait to see if the symptoms will go away. Do not drive yourself to the  hospital. Summary Pneumonia is an infection of the lungs. Community-acquired pneumonia develops in people who have not been in the hospital. It can be caused by bacteria, viruses, or fungi. This condition may be treated with antibiotics or antiviral medicines. Severe pneumonia may require a hospital stay and treatment to help with breathing. This information is not intended to replace advice given to you by your health care provider. Make sure you discuss any questions you have with your health care provider. Document Revised: 05/10/2023 Document Reviewed: 08/04/2021 Elsevier Patient Education  2025 [employer].

## 2024-01-04 ENCOUNTER — Ambulatory Visit: Payer: Self-pay | Admitting: Family

## 2024-01-16 ENCOUNTER — Ambulatory Visit: Admitting: Orthopedic Surgery

## 2024-01-16 ENCOUNTER — Other Ambulatory Visit (INDEPENDENT_AMBULATORY_CARE_PROVIDER_SITE_OTHER): Payer: Self-pay

## 2024-01-16 ENCOUNTER — Other Ambulatory Visit: Payer: Self-pay

## 2024-01-16 ENCOUNTER — Encounter: Payer: Self-pay | Admitting: Orthopedic Surgery

## 2024-01-16 VITALS — BP 139/74 | HR 70 | Ht 68.0 in | Wt 139.0 lb

## 2024-01-16 DIAGNOSIS — M79672 Pain in left foot: Secondary | ICD-10-CM | POA: Diagnosis not present

## 2024-01-16 DIAGNOSIS — K5732 Diverticulitis of large intestine without perforation or abscess without bleeding: Secondary | ICD-10-CM | POA: Insufficient documentation

## 2024-01-16 DIAGNOSIS — G621 Alcoholic polyneuropathy: Secondary | ICD-10-CM | POA: Diagnosis not present

## 2024-01-16 DIAGNOSIS — F1010 Alcohol abuse, uncomplicated: Secondary | ICD-10-CM | POA: Insufficient documentation

## 2024-01-16 DIAGNOSIS — M79671 Pain in right foot: Secondary | ICD-10-CM | POA: Diagnosis not present

## 2024-01-16 NOTE — Patient Instructions (Signed)
 Consider topical treatments.  Foot pain.  Diclofenac/Voltaren gel.  Can consider Epsom salts soaks  Follow-up as needed

## 2024-01-16 NOTE — Progress Notes (Signed)
 New Patient Visit  Assessment: [name] is a 83 y.o. male with the following: 1. Bilateral foot pain  Plan: [name] burning pain in the plantar aspect of bilateral feet.  Presentation is consistent with peripheral neuropathy.  He has tried gabapentin , but is allergic.  He is currently taking Lyrica .  In addition, we talked about protected weightbearing using a cane or a walker, to prevent falls.  Can also consider topical treatments.  Epsom salt soaks.  Nothing further to offer for treatment for peripheral neuropathy.  Follow-up: Return if symptoms worsen or fail to improve.  Subjective:  Chief Complaint  Patient presents with   Foot Pain    Both for 2 years/ can't balance / walk on uneven surface     History of Present Illness: [name] is a 83 y.o. male who has been referred by Bari Learn, FNP for evaluation of lateral foot pain.  He said pain in the plantar aspect of bilateral feet for the past 1-2 years.  He describes it as a burning pain.  It affects his ability to ambulate.  He has no difficulty on flat ground, but cannot walk in his yard.  He uses a cane or a walker.  They tried gabapentin  initially, but he could not tolerate this.  He has been taking Lyrica .  The pain is progressively worsening.  He denies pain in his legs otherwise.   Review of Systems: No fevers or chills + numbness or tingling No chest pain No shortness of breath No bowel or bladder dysfunction No GI distress No headaches   Medical History:  Past Medical History:  Diagnosis Date   Anxiety    Arthritis    CAD (coronary artery disease)    Nonobstructive cath 2012.  PCI to RCA 2003   Carotid artery disease (HCC)    Cataract    CHF (congestive heart failure) (HCC)    COPD (chronic obstructive pulmonary disease) (HCC)    Depression    Dyslipidemia    EtOH dependence (HCC)    GERD (gastroesophageal reflux disease)    Hyperlipidemia    Hypertension    Insomnia     Nonischemic cardiomyopathy (HCC)    EF was 20% (60% last echo 2015)   Pacemaker    and defibrillator   PAF (paroxysmal atrial fibrillation) (HCC)    Peptic ulcer disease    Tobacco abuse     Past Surgical History:  Procedure Laterality Date   BIV ICD GENERATOR CHANGEOUT N/A 09/18/2023   Procedure: BIV ICD GENERATOR CHANGEOUT;  Surgeon: Mealor, Eulas BRAVO, MD;  Location: [facility];  Service: Cardiovascular;  Laterality: N/A;   BIV ICD GENERTAOR CHANGE OUT N/A 03/06/2014   [employer] Gen change by Dr Kelsie Marikay CRT-D with LV1 header)   biventricular defibrillator implantation     [employer] and pacemaker   CATARACT EXTRACTION W/PHACO Right 12/10/2015   Procedure: CATARACT EXTRACTION PHACO AND INTRAOCULAR LENS PLACEMENT RIGHT EYE; CDE: 19.30;  Surgeon: Cherene Mania, MD;  Location: [facility];  Service: Ophthalmology;  Laterality: Right;   CATARACT EXTRACTION W/PHACO Left 01/14/2016   Procedure: CATARACT EXTRACTION PHACO AND INTRAOCULAR LENS PLACEMENT LEFT EYE; CDE:  8.03;  Surgeon: Cherene Mania, MD;  Location: [facility];  Service: Ophthalmology;  Laterality: Left;   COLONOSCOPY     EYE SURGERY     Ulcer surgery     repair of stomach ulcer    Family History  Problem Relation Age of Onset  Heart failure Father    Colon polyps Sister    GI Bleed Sister    GI Bleed Brother    Heart attack Brother    Psoriasis Daughter    Arthritis/Rheumatoid Daughter    Cancer Brother    Heart disease Brother    Heart disease Brother    Colon cancer Neg Hx    Esophageal cancer Neg Hx    Rectal cancer Neg Hx    Stomach cancer Neg Hx    Social History   Tobacco Use   Smoking status: Some Days    Current packs/day: 0.25    Average packs/day: 0.3 packs/day for 45.0 years (11.3 ttl pk-yrs)    Types: Cigarettes   Smokeless tobacco: Former    Types: Chew    Quit date: 06/20/1989   Tobacco comments:    he is not interested in cessation    Patient started back smoking about 3  Cigarettes a daily  Vaping Use   Vaping status: Never Used  Substance Use Topics   Alcohol  use: Yes    Alcohol /week: 6.0 standard drinks of alcohol     Types: 6 Cans of beer per week    Comment: drings 4-6 beers per day   Drug use: No    Types: Marijuana    Comment: last used marijuana yesterday    Allergies  Allergen Reactions   Gabapentin  Anaphylaxis   Sudafed [Pseudoephedrine Hcl] Other (See Comments)    Unknown   Prednisone  Other (See Comments)    Depression and personality changes.   Avelox [Moxifloxacin Hcl In Nacl] Other (See Comments)    Unknown     Current Meds  Medication Sig   ALPRAZolam  (XANAX ) 1 MG tablet TAKE 1 TABLET BY MOUTH TWICE DAILY . APPOINTMENT REQUIRED FOR FUTURE REFILLS   amLODipine  (NORVASC ) 2.5 MG tablet Take 1 tablet (2.5 mg total) by mouth daily.   apixaban  (ELIQUIS ) 5 MG TABS tablet Take 1 tablet by mouth twice daily   cetirizine  (ZYRTEC ) 10 MG tablet Take 10 mg by mouth at bedtime.   lisinopril  (ZESTRIL ) 40 MG tablet Take 1 tablet (40 mg total) by mouth daily.   metoprolol  tartrate (LOPRESSOR ) 100 MG tablet Take 1 tablet (100 mg total) by mouth 2 (two) times daily.   Multiple Vitamins-Minerals (MULTIVITAMIN WITH MINERALS) tablet Take 1 tablet by mouth at bedtime.   pantoprazole  (PROTONIX ) 40 MG tablet Take 1 tablet (40 mg total) by mouth daily.   pravastatin  (PRAVACHOL ) 80 MG tablet Take 1 tablet (80 mg total) by mouth every evening.   pregabalin  (LYRICA ) 75 MG capsule Take 1 capsule (75 mg total) by mouth 2 (two) times daily.   tamsulosin  (FLOMAX ) 0.4 MG CAPS capsule Take 1 capsule (0.4 mg total) by mouth daily.   torsemide  (DEMADEX ) 20 MG tablet Take 10 mg by mouth daily. May take an additional 10 mg if needed for fluid    Objective: BP 139/74   Pulse 70   Ht 5' 8 (1.727 m)   Wt 139 lb (63 kg)   BMI 21.13 kg/m   Physical Exam:  General: Elderly male., Alert and oriented., and No acute distress. Gait: Ambulates with the assistance of  a cane  Evaluation of bilateral feet demonstrates no deformity.  No swelling.  Decree sensation to the plantar aspect of his feet.  Negative anterior drawer bilaterally.  2+ DP pulse bilaterally.  IMAGING: I personally ordered and reviewed the following images  X-rays of bilateral feet were obtained in clinic today.  These  are nonweightbearing views.  Well-maintained joint space.  No acute injuries.  No dislocation.  Minimal degenerative changes.  Impression: Negative bilateral feet x-rays.   New Medications:  No orders of the defined types were placed in this encounter.     Oneil DELENA Horde, MD  01/16/2024 9:36 AM

## 2024-01-19 ENCOUNTER — Ambulatory Visit: Attending: Cardiovascular Disease | Admitting: Cardiovascular Disease

## 2024-01-19 ENCOUNTER — Encounter: Payer: Self-pay | Admitting: Cardiovascular Disease

## 2024-01-19 VITALS — BP 175/88 | HR 70 | Ht 66.0 in | Wt 139.0 lb

## 2024-01-19 DIAGNOSIS — I5022 Chronic systolic (congestive) heart failure: Secondary | ICD-10-CM

## 2024-01-19 DIAGNOSIS — I480 Paroxysmal atrial fibrillation: Secondary | ICD-10-CM | POA: Diagnosis not present

## 2024-01-19 LAB — CUP PACEART INCLINIC DEVICE CHECK
Date Time Interrogation Session: 20250801164817
HighPow Impedance: 50 Ohm
Implantable Lead Connection Status: 753985
Implantable Lead Connection Status: 753985
Implantable Lead Connection Status: 753985
Implantable Lead Implant Date: 20040312
Implantable Lead Implant Date: 20040312
Implantable Lead Implant Date: 20040312
Implantable Lead Location: 753858
Implantable Lead Location: 753859
Implantable Lead Location: 753860
Implantable Lead Model: 158
Implantable Lead Model: 4087
Implantable Lead Model: 4513
Implantable Lead Serial Number: 129158
Implantable Lead Serial Number: 209446
Implantable Lead Serial Number: 407264
Implantable Pulse Generator Implant Date: 20250331
Lead Channel Impedance Value: 1189 Ohm
Lead Channel Impedance Value: 528 Ohm
Lead Channel Impedance Value: 652 Ohm
Lead Channel Pacing Threshold Amplitude: 0.8 V
Lead Channel Pacing Threshold Amplitude: 2.3 V
Lead Channel Pacing Threshold Pulse Width: 0.4 ms
Lead Channel Pacing Threshold Pulse Width: 0.8 ms
Lead Channel Sensing Intrinsic Amplitude: 1.4 mV
Lead Channel Setting Pacing Amplitude: 2.5 V
Lead Channel Setting Pacing Amplitude: 3.3 V
Lead Channel Setting Pacing Amplitude: 3.5 V
Lead Channel Setting Pacing Pulse Width: 0.4 ms
Lead Channel Setting Pacing Pulse Width: 0.8 ms
Lead Channel Setting Sensing Sensitivity: 0.5 mV
Lead Channel Setting Sensing Sensitivity: 1 mV
Pulse Gen Serial Number: 107566

## 2024-01-19 MED ORDER — APIXABAN 2.5 MG PO TABS
2.5000 mg | ORAL_TABLET | Freq: Two times a day (BID) | ORAL | 6 refills | Status: AC
Start: 2024-01-19 — End: ?

## 2024-01-19 NOTE — Addendum Note (Signed)
 Addended by: Davante Gerke G on: 01/19/2024 02:06 PM   Modules accepted: Orders

## 2024-01-19 NOTE — Patient Instructions (Signed)
 Medication Instructions:   Decrease Eliquis to 2.5mg  twice a day   Continue all other medications.     Labwork:  none  Testing/Procedures:  none  Follow-Up:  6 months   Any Other Special Instructions Will Be Listed Below (If Applicable).   If you need a refill on your cardiac medications before your next appointment, please call your pharmacy.

## 2024-01-19 NOTE — Progress Notes (Signed)
   PCP: Lavell Bari LABOR, FNP Primary Cardiologist: Dr Lavona Primary EP: Dr Kelsie Ayesha [name] is a 83 y.o. male who presents today for routine electrophysiology followup.  Since last being seen in our clinic, the patient reports doing very well.       Today, he denies symptoms of palpitations, chest pain, shortness of breath,  lower extremity edema, dizziness, presyncope, syncope, or ICD shocks.  he has no device related complaints -- no new tenderness, drainage, redness. The patient is otherwise without complaint today. Feel like he's about 82      Physical Exam: Vitals:   01/19/24 1251  BP: (!) 175/88  Pulse: 70  SpO2: 96%  Weight: 139 lb (63 kg)  Height: 5' 6 (1.676 m)    Gen: Appears comfortable, well-nourished CV: RRR, no dependent edema The device site is normal -- no tenderness, edema, drainage, redness, threatened erosion. Pulm: breathing easily   ICD interrogation- reviewed in detail today,  See PACEART report    Wt Readings from Last 3 Encounters:  01/19/24 139 lb (63 kg)  01/16/24 139 lb (63 kg)  01/02/24 142 lb 9.6 oz (64.7 kg)       Assessment and Plan:  1.  Chronic systolic dysfunction/ nonischemic CM euvolemic today EF recovered with CRT Stable on an appropriate medical regimen Normal BiV ICD function See Pace Art report No changes today he is not device dependant today   2. VT Previously noted Current well controlled; no episodes this interrogation  3. HTN Stable No change required today  4. Paroxysmal atrial fibrillation I suspect his AF is persistent and undersensed Chads2vasc scoreis at least 5 on eliquis  5 --decrease dose to 2.5mg       Eulas FORBES Furbish, MD 01/19/2024 1:53 PM

## 2024-01-30 ENCOUNTER — Ambulatory Visit: Payer: Self-pay | Admitting: Cardiovascular Disease

## 2024-02-02 ENCOUNTER — Encounter

## 2024-02-15 DIAGNOSIS — M79674 Pain in right toe(s): Secondary | ICD-10-CM | POA: Diagnosis not present

## 2024-02-15 DIAGNOSIS — M79671 Pain in right foot: Secondary | ICD-10-CM | POA: Diagnosis not present

## 2024-02-15 DIAGNOSIS — L600 Ingrowing nail: Secondary | ICD-10-CM | POA: Diagnosis not present

## 2024-02-16 ENCOUNTER — Ambulatory Visit (INDEPENDENT_AMBULATORY_CARE_PROVIDER_SITE_OTHER)

## 2024-02-16 DIAGNOSIS — I5022 Chronic systolic (congestive) heart failure: Secondary | ICD-10-CM | POA: Diagnosis not present

## 2024-02-17 LAB — CUP PACEART REMOTE DEVICE CHECK
Battery Remaining Longevity: 102 mo
Battery Remaining Percentage: 100 %
Brady Statistic RA Percent Paced: 0 %
Brady Statistic RV Percent Paced: 100 %
Date Time Interrogation Session: 20250829004100
HighPow Impedance: 49 Ohm
Implantable Lead Connection Status: 753985
Implantable Lead Connection Status: 753985
Implantable Lead Connection Status: 753985
Implantable Lead Implant Date: 20040312
Implantable Lead Implant Date: 20040312
Implantable Lead Implant Date: 20040312
Implantable Lead Location: 753858
Implantable Lead Location: 753859
Implantable Lead Location: 753860
Implantable Lead Model: 158
Implantable Lead Model: 4087
Implantable Lead Model: 4513
Implantable Lead Serial Number: 129158
Implantable Lead Serial Number: 209446
Implantable Lead Serial Number: 407264
Implantable Pulse Generator Implant Date: 20250331
Lead Channel Impedance Value: 1186 Ohm
Lead Channel Impedance Value: 515 Ohm
Lead Channel Impedance Value: 646 Ohm
Lead Channel Pacing Threshold Amplitude: 0.8 V
Lead Channel Pacing Threshold Pulse Width: 0.4 ms
Lead Channel Setting Pacing Amplitude: 2.5 V
Lead Channel Setting Pacing Amplitude: 3.3 V
Lead Channel Setting Pacing Amplitude: 3.5 V
Lead Channel Setting Pacing Pulse Width: 0.4 ms
Lead Channel Setting Pacing Pulse Width: 0.8 ms
Lead Channel Setting Sensing Sensitivity: 0.5 mV
Lead Channel Setting Sensing Sensitivity: 1 mV
Pulse Gen Serial Number: 107566

## 2024-02-19 ENCOUNTER — Other Ambulatory Visit: Payer: Self-pay | Admitting: Cardiology

## 2024-02-19 ENCOUNTER — Other Ambulatory Visit: Payer: Self-pay | Admitting: Family

## 2024-02-19 DIAGNOSIS — N401 Enlarged prostate with lower urinary tract symptoms: Secondary | ICD-10-CM

## 2024-02-22 ENCOUNTER — Ambulatory Visit: Payer: Self-pay | Admitting: Cardiovascular Disease

## 2024-02-26 NOTE — Progress Notes (Signed)
Remote ICD Transmission.

## 2024-02-27 ENCOUNTER — Other Ambulatory Visit: Payer: Self-pay | Admitting: Family

## 2024-02-27 DIAGNOSIS — I10 Essential (primary) hypertension: Secondary | ICD-10-CM

## 2024-02-29 DIAGNOSIS — M79671 Pain in right foot: Secondary | ICD-10-CM | POA: Diagnosis not present

## 2024-02-29 DIAGNOSIS — L03031 Cellulitis of right toe: Secondary | ICD-10-CM | POA: Diagnosis not present

## 2024-02-29 DIAGNOSIS — B353 Tinea pedis: Secondary | ICD-10-CM | POA: Diagnosis not present

## 2024-02-29 DIAGNOSIS — M79674 Pain in right toe(s): Secondary | ICD-10-CM | POA: Diagnosis not present

## 2024-03-04 ENCOUNTER — Ambulatory Visit (INDEPENDENT_AMBULATORY_CARE_PROVIDER_SITE_OTHER)

## 2024-03-04 VITALS — BP 175/88 | HR 70 | Ht 66.0 in | Wt 139.0 lb

## 2024-03-04 DIAGNOSIS — Z0000 Encounter for general adult medical examination without abnormal findings: Secondary | ICD-10-CM | POA: Diagnosis not present

## 2024-03-04 NOTE — Patient Instructions (Signed)
 Mr. Vandevoort,  Thank you for taking the time for your Medicare Wellness Visit. I appreciate your continued commitment to your health goals. Please review the care plan we discussed, and feel free to reach out if I can assist you further.  Medicare recommends these wellness visits once per year to help you and your care team stay ahead of potential health issues. These visits are designed to focus on prevention, allowing your provider to concentrate on managing your acute and chronic conditions during your regular appointments.  Please note that Annual Wellness Visits do not include a physical exam. Some assessments may be limited, especially if the visit was conducted virtually. If needed, we may recommend a separate in-person follow-up with your provider.  Ongoing Care Seeing your primary care provider every 3 to 6 months helps us  monitor your health and provide consistent, personalized care.   Referrals If a referral was made during today's visit and you haven't received any updates within two weeks, please contact the referred provider directly to check on the status.  Recommended Screenings:  Health Maintenance  Topic Date Due   Medicare Annual Wellness Visit  11/11/2023   Flu Shot  01/19/2024   COVID-19 Vaccine (4 - 2025-26 season) 02/19/2024   DTaP/Tdap/Td vaccine (2 - Td or Tdap) 11/01/2027   Pneumococcal Vaccine for age over 53  Completed   Zoster (Shingles) Vaccine  Completed   HPV Vaccine  Aged Out   Meningitis B Vaccine  Aged Out   Colon Cancer Screening  Discontinued   Hepatitis C Screening  Discontinued       03/04/2024    4:00 PM  Advanced Directives  Does Patient Have a Medical Advance Directive? Yes  Type of Estate agent of [place];Living will  Copy of Healthcare Power of Attorney in Chart? No - copy requested   Advance Care Planning is important because it: Ensures you receive medical care that aligns with your values, goals, and  preferences. Provides guidance to your family and loved ones, reducing the emotional burden of decision-making during critical moments.  Vision: Annual vision screenings are recommended for early detection of glaucoma, cataracts, and diabetic retinopathy. These exams can also reveal signs of chronic conditions such as diabetes and high blood pressure.  Dental: Annual dental screenings help detect early signs of oral cancer, gum disease, and other conditions linked to overall health, including heart disease and diabetes.  Please see the attached documents for additional preventive care recommendations.

## 2024-03-04 NOTE — Progress Notes (Signed)
 Subjective:   [name] is a 83 y.o. who presents for a Medicare Wellness preventive visit.  As a reminder, Annual Wellness Visits don't include a physical exam, and some assessments may be limited, especially if this visit is performed virtually. We may recommend an in-person follow-up visit with your provider if needed.  Visit Complete: Virtual I connected with  [name] on 03/04/24 by a audio enabled telemedicine application and verified that I am speaking with the correct person using two identifiers.  Patient Location: Home  Provider Location: Home Office  I discussed the limitations of evaluation and management by telemedicine. The patient expressed understanding and agreed to proceed.  Vital Signs: Because this visit was a virtual/telehealth visit, some criteria may be missing or patient reported. Any vitals not documented were not able to be obtained and vitals that have been documented are patient reported.  VideoDeclined- This patient declined Librarian, academic. Therefore the visit was completed with audio only.  Persons Participating in Visit: Patient.  AWV Questionnaire: No: Patient Medicare AWV questionnaire was not completed prior to this visit.  Cardiac Risk Factors include: advanced age (>64men, >16 women);dyslipidemia;smoking/ tobacco exposure;male gender     Objective:    Today's Vitals   03/04/24 1548  BP: (!) 175/88  Pulse: 70  Weight: 139 lb (63 kg)  Height: 5' 6 (1.676 m)   Body mass index is 22.44 kg/m.     03/04/2024    4:00 PM 09/18/2023    2:23 PM 11/11/2022    8:46 AM 04/21/2021    4:15 PM 04/18/2019    8:32 AM 04/26/2018    9:50 AM 01/14/2016    7:46 AM  Advanced Directives  Does Patient Have a Medical Advance Directive? Yes Yes Yes No Yes No  No   Type of Estate agent of [place];Living will Healthcare Power of [place];Living will Healthcare Power of [place];Living will   Healthcare Power of [place];Living will    Does patient want to make changes to medical advance directive?  No - Guardian declined   No - Patient declined    Copy of Healthcare Power of Attorney in Chart? No - copy requested No - copy requested No - copy requested  No - copy requested    Would patient like information on creating a medical advance directive?    No - Patient declined   Yes - Educational materials given      Data saved with a previous flowsheet row definition    Current Medications (verified) Outpatient Encounter Medications as of 03/04/2024  Medication Sig   ALPRAZolam  (XANAX ) 1 MG tablet TAKE 1 TABLET BY MOUTH TWICE DAILY . APPOINTMENT REQUIRED FOR FUTURE REFILLS   amLODipine  (NORVASC ) 2.5 MG tablet Take 1 tablet by mouth once daily   apixaban  (ELIQUIS ) 2.5 MG TABS tablet Take 1 tablet (2.5 mg total) by mouth 2 (two) times daily.   cetirizine  (ZYRTEC ) 10 MG tablet Take 10 mg by mouth at bedtime.   lisinopril  (ZESTRIL ) 40 MG tablet Take 1 tablet (40 mg total) by mouth daily.   metoprolol  tartrate (LOPRESSOR ) 100 MG tablet Take 1 tablet by mouth twice daily   Multiple Vitamins-Minerals (MULTIVITAMIN WITH MINERALS) tablet Take 1 tablet by mouth at bedtime.   pantoprazole  (PROTONIX ) 40 MG tablet Take 1 tablet (40 mg total) by mouth daily.   pravastatin  (PRAVACHOL ) 80 MG tablet Take 1 tablet (80 mg total) by mouth every evening.   pregabalin  (LYRICA ) 75 MG capsule  Take 1 capsule (75 mg total) by mouth 2 (two) times daily.   tamsulosin  (FLOMAX ) 0.4 MG CAPS capsule Take 1 capsule by mouth once daily   torsemide  (DEMADEX ) 20 MG tablet Take 10 mg by mouth daily. May take an additional 10 mg if needed for fluid   No facility-administered encounter medications on file as of 03/04/2024.    Allergies (verified) Gabapentin , Sudafed [pseudoephedrine hcl], Prednisone , and Avelox [moxifloxacin hcl in nacl]   History: Past Medical History:  Diagnosis Date   Anxiety    Arthritis     CAD (coronary artery disease)    Nonobstructive cath 2012.  PCI to RCA 2003   Carotid artery disease (HCC)    Cataract    CHF (congestive heart failure) (HCC)    COPD (chronic obstructive pulmonary disease) (HCC)    Depression    Dyslipidemia    EtOH dependence (HCC)    GERD (gastroesophageal reflux disease)    Hyperlipidemia    Hypertension    Insomnia    Nonischemic cardiomyopathy (HCC)    EF was 20% (60% last echo 2015)   Pacemaker    and defibrillator   PAF (paroxysmal atrial fibrillation) (HCC)    Peptic ulcer disease    Tobacco abuse    Past Surgical History:  Procedure Laterality Date   BIV ICD GENERATOR CHANGEOUT N/A 09/18/2023   Procedure: BIV ICD GENERATOR CHANGEOUT;  Surgeon: Mealor, Eulas BRAVO, MD;  Location: [facility];  Service: Cardiovascular;  Laterality: N/A;   BIV ICD GENERTAOR CHANGE OUT N/A 03/06/2014   [employer] Gen change by Dr Kelsie Marikay CRT-D with LV1 header)   biventricular defibrillator implantation     [employer] and pacemaker   CATARACT EXTRACTION W/PHACO Right 12/10/2015   Procedure: CATARACT EXTRACTION PHACO AND INTRAOCULAR LENS PLACEMENT RIGHT EYE; CDE: 19.30;  Surgeon: Cherene Mania, MD;  Location: [facility];  Service: Ophthalmology;  Laterality: Right;   CATARACT EXTRACTION W/PHACO Left 01/14/2016   Procedure: CATARACT EXTRACTION PHACO AND INTRAOCULAR LENS PLACEMENT LEFT EYE; CDE:  8.03;  Surgeon: Cherene Mania, MD;  Location: [facility];  Service: Ophthalmology;  Laterality: Left;   COLONOSCOPY     EYE SURGERY     Ulcer surgery     repair of stomach ulcer   Family History  Problem Relation Age of Onset   Heart failure Father    Colon polyps Sister    GI Bleed Sister    GI Bleed Brother    Heart attack Brother    Psoriasis Daughter    Arthritis/Rheumatoid Daughter    Cancer Brother    Heart disease Brother    Heart disease Brother    Colon cancer Neg Hx    Esophageal cancer Neg Hx    Rectal cancer Neg Hx    Stomach  cancer Neg Hx    Social History   Socioeconomic History   Marital status: Divorced    Spouse name: Not on file   Number of children: 4   Years of education: Some college   Highest education level: 8th grade  Occupational History   Occupation: Retired    Associate Professor: [employer]  Tobacco Use   Smoking status: Some Days    Current packs/day: 0.25    Average packs/day: 0.3 packs/day for 45.0 years (11.3 ttl pk-yrs)    Types: Cigarettes   Smokeless tobacco: Former    Types: Chew    Quit date: 06/20/1989   Tobacco comments:    he is not interested in  cessation    Patient started back smoking about 3 Cigarettes a daily  Vaping Use   Vaping status: Never Used  Substance and Sexual Activity   Alcohol  use: Yes    Alcohol /week: 6.0 standard drinks of alcohol     Types: 6 Cans of beer per week    Comment: drings 4-6 beers per day   Drug use: No    Types: Marijuana    Comment: last used marijuana yesterday   Sexual activity: Not Currently    Birth control/protection: None  Other Topics Concern   Not on file  Social History Narrative   Divorced 2 sons 2 daughters   Is retired from Administrator, sports, he was a Psychologist, occupational   3 alcoholic beverages daily no caffeine no drugs he still smokes no other tobacco or drug use   Social Drivers of Corporate investment banker Strain: Low Risk  (03/04/2024)   Overall Financial Resource Strain (CARDIA)    Difficulty of Paying Living Expenses: Not hard at all  Food Insecurity: No Food Insecurity (03/04/2024)   Hunger Vital Sign    Worried About Running Out of Food in the Last Year: Never true    Ran Out of Food in the Last Year: Never true  Transportation Needs: No Transportation Needs (03/04/2024)   PRAPARE - Administrator, Civil Service (Medical): No    Lack of Transportation (Non-Medical): No  Physical Activity: Inactive (03/04/2024)   Exercise Vital Sign    Days of Exercise per Week: 0 days    Minutes of Exercise per  Session: 0 min  Stress: No Stress Concern Present (03/04/2024)   [employer] of Occupational Health - Occupational Stress Questionnaire    Feeling of Stress: Not at all  Social Connections: Socially Isolated (03/04/2024)   Social Connection and Isolation Panel    Frequency of Communication with Friends and Family: More than three times a week    Frequency of Social Gatherings with Friends and Family: More than three times a week    Attends Religious Services: Never    Database administrator or Organizations: No    Attends Engineer, structural: Never    Marital Status: Divorced    Tobacco Counseling Ready to quit: No Counseling given: Yes Tobacco comments: he is not interested in cessation Patient started back smoking about 3 Cigarettes a daily    Clinical Intake:  Pre-visit preparation completed: Yes  Pain : No/denies pain     BMI - recorded: 22.44 Nutritional Status: BMI of 19-24  Normal Nutritional Risks: None Diabetes: No  Lab Results  Component Value Date   HGBA1C 5.7 09/07/2010     How often do you need to have someone help you when you read instructions, pamphlets, or other written materials from your doctor or pharmacy?: 5 - Always (pt's daughter)  Interpreter Needed?: No  Information entered by :: alia t/cma   Activities of Daily Living     03/04/2024    3:57 PM  In your present state of health, do you have any difficulty performing the following activities:  Hearing? 0  Vision? 1  Difficulty concentrating or making decisions? 1  Comment remember  Walking or climbing stairs? 1  Dressing or bathing? 1  Doing errands, shopping? 1  Comment pt's daughter  Quarry manager and eating ? Y  Comment pt's daughter  Using the Toilet? N  In the past six months, have you accidently leaked urine? Y  Do you have problems  with loss of bowel control? N  Managing your Medications? Y  Comment pt's daughter  Managing your Finances? Y  Comment pt's  daughter  Housekeeping or managing your Housekeeping? Y  Comment pt's daughter    Patient Care Team: Lavell Bari LABOR, FNP as PCP - General (Nurse Practitioner) Lavona Agent, MD as PCP - Cardiology (Cardiology) Mealor, Eulas BRAVO, MD as PCP - Electrophysiology (Cardiology)  I have updated your Care Teams any recent Medical Services you may have received from other providers in the past year.     Assessment:   This is a routine wellness examination for [employer].  Hearing/Vision screen Hearing Screening - Comments:: Pt denies hearing dif Vision Screening - Comments:: Pt have vision dif/[facility] [place]/upcoming apt   Goals Addressed             This Visit's Progress    Quit Smoking   On track      Depression Screen     03/04/2024    4:01 PM 12/25/2023    3:58 PM 10/03/2023   11:22 AM 01/23/2023   11:05 AM 12/20/2022    9:11 AM 11/11/2022    8:45 AM 09/20/2022    9:26 AM  PHQ 2/9 Scores  PHQ - 2 Score 1  0 0 0 0   PHQ- 9 Score   0 0     Exception Documentation  Other- indicate reason in comment box     Patient refusal  Not completed  unable to assess         Fall Risk     03/04/2024    3:50 PM 12/25/2023    3:57 PM 01/23/2023   11:05 AM 12/20/2022    9:11 AM 11/11/2022    8:44 AM  Fall Risk   Falls in the past year? 1 0 0 0 0  Number falls in past yr: 1   0 0  Injury with Fall? 0   0 0  Risk for fall due to : Impaired balance/gait;Impaired mobility   Impaired balance/gait No Fall Risks  Follow up Falls evaluation completed;Education provided Falls evaluation completed;Falls prevention discussed  Falls evaluation completed;Education provided Falls prevention discussed    MEDICARE RISK AT HOME:  Medicare Risk at Home Any stairs in or around the home?: Yes If so, are there any without handrails?: Yes Home free of loose throw rugs in walkways, pet beds, electrical cords, etc?: Yes Adequate lighting in your home to reduce risk of falls?: Yes Life alert?: No Use of a cane,  walker or w/c?: Yes Grab bars in the bathroom?: Yes Shower chair or bench in shower?: Yes Elevated toilet seat or a handicapped toilet?: No  TIMED UP AND GO:  Was the test performed?  no  Cognitive Function: 6CIT completed    10/16/2017   11:06 AM  MMSE - Mini Mental State Exam  Orientation to time 3  Orientation to Place 4  Registration 3  Attention/ Calculation 2  Recall 1  Language- name 2 objects 2  Language- repeat 1  Language- follow 3 step command 3  Language- read & follow direction 1  Write a sentence 1  Copy design 1  Total score 22        03/04/2024    4:05 PM 11/11/2022    8:46 AM 04/21/2021    4:37 PM 04/21/2021    4:18 PM 04/18/2019    8:36 AM  6CIT Screen  What Year? 0 points 0 points 0 points 0 points 0 points  What month? 0 points 0 points 0 points 0 points 0 points  What time? 0 points 0 points 0 points 0 points 0 points  Count back from 20 0 points 0 points 0 points 0 points 0 points  Months in reverse 4 points 0 points 0 points 0 points 0 points  Repeat phrase 2 points 0 points 2 points 2 points 4 points  Total Score 6 points 0 points 2 points 2 points 4 points    Immunizations Immunization History  Administered Date(s) Administered   Fluad Quad(high Dose 65+) 04/17/2019, 05/03/2022   INFLUENZA, HIGH DOSE SEASONAL PF 04/24/2014, 04/09/2015, 03/28/2017, 03/20/2018, 04/24/2018, 02/22/2019, 02/25/2019, 03/09/2023   Influenza Whole 05/13/2008   Influenza,inj,Quad PF,6+ Mos 03/15/2016   Influenza-Unspecified 03/20/2004, 04/26/2005, 04/24/2006, 03/20/2013, 04/22/2020   Moderna Sars-Covid-2 Vaccination 07/25/2019, 08/23/2019, 06/04/2020   Novel Infuenza-h1n1-09 05/13/2008   Pneumococcal Conjugate-13 05/05/2014, 09/24/2015   Pneumococcal Polysaccharide-23 03/28/2017   Pneumococcal-Unspecified 03/20/2001   Tdap 10/31/2017   Zoster Recombinant(Shingrix ) 11/07/2017, 02/05/2018, 08/12/2021, 07/18/2022    Screening Tests Health Maintenance  Topic Date  Due   Influenza Vaccine  01/19/2024   COVID-19 Vaccine (4 - 2025-26 season) 02/19/2024   Medicare Annual Wellness (AWV)  03/04/2025   DTaP/Tdap/Td (2 - Td or Tdap) 11/01/2027   Pneumococcal Vaccine: 50+ Years  Completed   Zoster Vaccines- Shingrix   Completed   HPV VACCINES  Aged Out   Meningococcal B Vaccine  Aged Out   Colonoscopy  Discontinued   Hepatitis C Screening  Discontinued    Health Maintenance Items Addressed: See Nurse Notes at the end of this note  Additional Screening:  Vision Screening: Recommended annual ophthalmology exams for early detection of glaucoma and other disorders of the eye. Is the patient up to date with their annual eye exam?  Yes  Who is the provider or what is the name of the office in which the patient attends annual eye exams? [facility]   Dental Screening: Recommended annual dental exams for proper oral hygiene  Community Resource Referral / Chronic Care Management: CRR required this visit?  No   CCM required this visit?  No   Plan:    I have personally reviewed and noted the following in the patient's chart:   Medical and social history Use of alcohol , tobacco or illicit drugs  Current medications and supplements including opioid prescriptions. Patient is not currently taking opioid prescriptions. Functional ability and status Nutritional status Physical activity Advanced directives List of other physicians Hospitalizations, surgeries, and ER visits in previous 12 months Vitals Screenings to include cognitive, depression, and falls Referrals and appointments  In addition, I have reviewed and discussed with patient certain preventive protocols, quality metrics, and best practice recommendations. A written personalized care plan for preventive services as well as general preventive health recommendations were provided to patient.   Ozie Ned, CMA   03/04/2024   After Visit Summary: (MyChart) Due to this being a  telephonic visit, the after visit summary with patients personalized plan was offered to patient via MyChart   Notes: Nothing significant to report at this time.

## 2024-03-19 DIAGNOSIS — Z85828 Personal history of other malignant neoplasm of skin: Secondary | ICD-10-CM | POA: Diagnosis not present

## 2024-03-19 DIAGNOSIS — X32XXXD Exposure to sunlight, subsequent encounter: Secondary | ICD-10-CM | POA: Diagnosis not present

## 2024-03-19 DIAGNOSIS — L570 Actinic keratosis: Secondary | ICD-10-CM | POA: Diagnosis not present

## 2024-03-19 DIAGNOSIS — L308 Other specified dermatitis: Secondary | ICD-10-CM | POA: Diagnosis not present

## 2024-03-19 DIAGNOSIS — Z08 Encounter for follow-up examination after completed treatment for malignant neoplasm: Secondary | ICD-10-CM | POA: Diagnosis not present

## 2024-03-26 DIAGNOSIS — M79671 Pain in right foot: Secondary | ICD-10-CM | POA: Diagnosis not present

## 2024-03-26 DIAGNOSIS — L600 Ingrowing nail: Secondary | ICD-10-CM | POA: Diagnosis not present

## 2024-03-26 DIAGNOSIS — M79674 Pain in right toe(s): Secondary | ICD-10-CM | POA: Diagnosis not present

## 2024-03-31 ENCOUNTER — Other Ambulatory Visit: Payer: Self-pay | Admitting: Family

## 2024-03-31 DIAGNOSIS — I10 Essential (primary) hypertension: Secondary | ICD-10-CM

## 2024-04-04 ENCOUNTER — Encounter: Payer: Self-pay | Admitting: Family

## 2024-04-04 ENCOUNTER — Ambulatory Visit: Admitting: Family

## 2024-04-04 VITALS — BP 144/79 | HR 70 | Temp 97.5°F | Ht 66.0 in | Wt 137.4 lb

## 2024-04-04 DIAGNOSIS — F1010 Alcohol abuse, uncomplicated: Secondary | ICD-10-CM

## 2024-04-04 DIAGNOSIS — F1320 Sedative, hypnotic or anxiolytic dependence, uncomplicated: Secondary | ICD-10-CM

## 2024-04-04 DIAGNOSIS — I700 Atherosclerosis of aorta: Secondary | ICD-10-CM | POA: Diagnosis not present

## 2024-04-04 DIAGNOSIS — Z79899 Other long term (current) drug therapy: Secondary | ICD-10-CM | POA: Diagnosis not present

## 2024-04-04 DIAGNOSIS — J432 Centrilobular emphysema: Secondary | ICD-10-CM

## 2024-04-04 DIAGNOSIS — Z95811 Presence of heart assist device: Secondary | ICD-10-CM

## 2024-04-04 DIAGNOSIS — I6930 Unspecified sequelae of cerebral infarction: Secondary | ICD-10-CM | POA: Diagnosis not present

## 2024-04-04 DIAGNOSIS — Z23 Encounter for immunization: Secondary | ICD-10-CM

## 2024-04-04 DIAGNOSIS — F411 Generalized anxiety disorder: Secondary | ICD-10-CM | POA: Diagnosis not present

## 2024-04-04 DIAGNOSIS — I10 Essential (primary) hypertension: Secondary | ICD-10-CM | POA: Diagnosis not present

## 2024-04-04 DIAGNOSIS — G621 Alcoholic polyneuropathy: Secondary | ICD-10-CM

## 2024-04-04 DIAGNOSIS — N401 Enlarged prostate with lower urinary tract symptoms: Secondary | ICD-10-CM | POA: Diagnosis not present

## 2024-04-04 DIAGNOSIS — R3914 Feeling of incomplete bladder emptying: Secondary | ICD-10-CM | POA: Diagnosis not present

## 2024-04-04 LAB — CBC WITH DIFFERENTIAL/PLATELET
Basophils Absolute: 0.1 x10E3/uL (ref 0.0–0.2)
Basos: 1 %
EOS (ABSOLUTE): 0.1 x10E3/uL (ref 0.0–0.4)
Eos: 1 %
Hematocrit: 36.2 % — ABNORMAL LOW (ref 37.5–51.0)
Hemoglobin: 12.2 g/dL — ABNORMAL LOW (ref 13.0–17.7)
Immature Grans (Abs): 0 x10E3/uL (ref 0.0–0.1)
Immature Granulocytes: 0 %
Lymphocytes Absolute: 1.8 x10E3/uL (ref 0.7–3.1)
Lymphs: 26 %
MCH: 32.8 pg (ref 26.6–33.0)
MCHC: 33.7 g/dL (ref 31.5–35.7)
MCV: 97 fL (ref 79–97)
Monocytes Absolute: 0.9 x10E3/uL (ref 0.1–0.9)
Monocytes: 13 %
Neutrophils Absolute: 4.1 x10E3/uL (ref 1.4–7.0)
Neutrophils: 59 %
Platelets: 313 x10E3/uL (ref 150–450)
RBC: 3.72 x10E6/uL — ABNORMAL LOW (ref 4.14–5.80)
RDW: 13.1 % (ref 11.6–15.4)
WBC: 7 x10E3/uL (ref 3.4–10.8)

## 2024-04-04 LAB — CMP14+EGFR
ALT: 17 IU/L (ref 0–44)
AST: 37 IU/L (ref 0–40)
Albumin: 4.4 g/dL (ref 3.8–4.8)
Alkaline Phosphatase: 124 IU/L — ABNORMAL HIGH (ref 47–123)
BUN/Creatinine Ratio: 13 (ref 10–24)
BUN: 19 mg/dL (ref 8–27)
Bilirubin Total: 0.5 mg/dL (ref 0.0–1.2)
CO2: 23 mmol/L (ref 20–29)
Calcium: 9.7 mg/dL (ref 8.6–10.2)
Chloride: 93 mmol/L — ABNORMAL LOW (ref 96–106)
Creatinine, Ser: 1.42 mg/dL — ABNORMAL HIGH (ref 0.76–1.27)
Globulin, Total: 3 g/dL (ref 1.5–4.5)
Glucose: 101 mg/dL — ABNORMAL HIGH (ref 70–99)
Potassium: 5 mmol/L (ref 3.5–5.2)
Sodium: 131 mmol/L — ABNORMAL LOW (ref 134–144)
Total Protein: 7.4 g/dL (ref 6.0–8.5)
eGFR: 50 mL/min/1.73 — ABNORMAL LOW (ref >59)

## 2024-04-04 MED ORDER — METOPROLOL TARTRATE 100 MG PO TABS: 100.0000 mg | ORAL_TABLET | Freq: Two times a day (BID) | ORAL | 0 refills | Status: DC

## 2024-04-04 MED ORDER — ALPRAZOLAM 1 MG PO TABS: ORAL_TABLET | ORAL | 2 refills | Status: DC

## 2024-04-04 MED ORDER — PREGABALIN 75 MG PO CAPS: 75.0000 mg | ORAL_CAPSULE | Freq: Two times a day (BID) | ORAL | 0 refills | Status: DC

## 2024-04-04 MED ORDER — TAMSULOSIN HCL 0.4 MG PO CAPS: 0.4000 mg | ORAL_CAPSULE | Freq: Every day | ORAL | 0 refills | Status: DC

## 2024-04-04 MED ORDER — LISINOPRIL 40 MG PO TABS: 40.0000 mg | ORAL_TABLET | Freq: Every day | ORAL | 0 refills | Status: DC

## 2024-04-04 NOTE — Patient Instructions (Signed)
 Hypertension, Adult High blood pressure (hypertension) is when the force of blood pumping through the arteries is too strong. The arteries are the blood vessels that carry blood from the heart throughout the body. Hypertension forces the heart to work harder to pump blood and may cause arteries to become narrow or stiff. Untreated or uncontrolled hypertension can lead to a heart attack, heart failure, a stroke, kidney disease, and other problems. A blood pressure reading consists of a higher number over a lower number. Ideally, your blood pressure should be below 120/80. The first ("top") number is called the systolic pressure. It is a measure of the pressure in your arteries as your heart beats. The second ("bottom") number is called the diastolic pressure. It is a measure of the pressure in your arteries as the heart relaxes. What are the causes? The exact cause of this condition is not known. There are some conditions that result in high blood pressure. What increases the risk? Certain factors may make you more likely to develop high blood pressure. Some of these risk factors are under your control, including: Smoking. Not getting enough exercise or physical activity. Being overweight. Having too much fat, sugar, calories, or salt (sodium) in your diet. Drinking too much alcohol. Other risk factors include: Having a personal history of heart disease, diabetes, high cholesterol, or kidney disease. Stress. Having a family history of high blood pressure and high cholesterol. Having obstructive sleep apnea. Age. The risk increases with age. What are the signs or symptoms? High blood pressure may not cause symptoms. Very high blood pressure (hypertensive crisis) may cause: Headache. Fast or irregular heartbeats (palpitations). Shortness of breath. Nosebleed. Nausea and vomiting. Vision changes. Severe chest pain, dizziness, and seizures. How is this diagnosed? This condition is diagnosed by  measuring your blood pressure while you are seated, with your arm resting on a flat surface, your legs uncrossed, and your feet flat on the floor. The cuff of the blood pressure monitor will be placed directly against the skin of your upper arm at the level of your heart. Blood pressure should be measured at least twice using the same arm. Certain conditions can cause a difference in blood pressure between your right and left arms. If you have a high blood pressure reading during one visit or you have normal blood pressure with other risk factors, you may be asked to: Return on a different day to have your blood pressure checked again. Monitor your blood pressure at home for 1 week or longer. If you are diagnosed with hypertension, you may have other blood or imaging tests to help your health care provider understand your overall risk for other conditions. How is this treated? This condition is treated by making healthy lifestyle changes, such as eating healthy foods, exercising more, and reducing your alcohol intake. You may be referred for counseling on a healthy diet and physical activity. Your health care provider may prescribe medicine if lifestyle changes are not enough to get your blood pressure under control and if: Your systolic blood pressure is above 130. Your diastolic blood pressure is above 80. Your personal target blood pressure may vary depending on your medical conditions, your age, and other factors. Follow these instructions at home: Eating and drinking  Eat a diet that is high in fiber and potassium, and low in sodium, added sugar, and fat. An example of this eating plan is called the DASH diet. DASH stands for Dietary Approaches to Stop Hypertension. To eat this way: Eat  plenty of fresh fruits and vegetables. Try to fill one half of your plate at each meal with fruits and vegetables. Eat whole grains, such as whole-wheat pasta, brown rice, or whole-grain bread. Fill about one  fourth of your plate with whole grains. Eat or drink low-fat dairy products, such as skim milk or low-fat yogurt. Avoid fatty cuts of meat, processed or cured meats, and poultry with skin. Fill about one fourth of your plate with lean proteins, such as fish, chicken without skin, beans, eggs, or tofu. Avoid pre-made and processed foods. These tend to be higher in sodium, added sugar, and fat. Reduce your daily sodium intake. Many people with hypertension should eat less than 1,500 mg of sodium a day. Do not drink alcohol if: Your health care provider tells you not to drink. You are pregnant, may be pregnant, or are planning to become pregnant. If you drink alcohol: Limit how much you have to: 0-1 drink a day for women. 0-2 drinks a day for men. Know how much alcohol is in your drink. In the [place] one drink equals one 12 oz bottle of beer (355 mL), one 5 oz glass of wine (148 mL), or one 1 oz glass of hard liquor (44 mL). Lifestyle  Work with your health care provider to maintain a healthy body weight or to lose weight. Ask what an ideal weight is for you. Get at least 30 minutes of exercise that causes your heart to beat faster (aerobic exercise) most days of the week. Activities may include walking, swimming, or biking. Include exercise to strengthen your muscles (resistance exercise), such as Pilates or lifting weights, as part of your weekly exercise routine. Try to do these types of exercises for 30 minutes at least 3 days a week. Do not use any products that contain nicotine or tobacco. These products include cigarettes, chewing tobacco, and vaping devices, such as e-cigarettes. If you need help quitting, ask your health care provider. Monitor your blood pressure at home as told by your health care provider. Keep all follow-up visits. This is important. Medicines Take over-the-counter and prescription medicines only as told by your health care provider. Follow directions carefully. Blood  pressure medicines must be taken as prescribed. Do not skip doses of blood pressure medicine. Doing this puts you at risk for problems and can make the medicine less effective. Ask your health care provider about side effects or reactions to medicines that you should watch for. Contact a health care provider if you: Think you are having a reaction to a medicine you are taking. Have headaches that keep coming back (recurring). Feel dizzy. Have swelling in your ankles. Have trouble with your vision. Get help right away if you: Develop a severe headache or confusion. Have unusual weakness or numbness. Feel faint. Have severe pain in your chest or abdomen. Vomit repeatedly. Have trouble breathing. These symptoms may be an emergency. Get help right away. Call 911. Do not wait to see if the symptoms will go away. Do not drive yourself to the hospital. Summary Hypertension is when the force of blood pumping through your arteries is too strong. If this condition is not controlled, it may put you at risk for serious complications. Your personal target blood pressure may vary depending on your medical conditions, your age, and other factors. For most people, a normal blood pressure is less than 120/80. Hypertension is treated with lifestyle changes, medicines, or a combination of both. Lifestyle changes include losing weight, eating a healthy,  low-sodium diet, exercising more, and limiting alcohol. This information is not intended to replace advice given to you by your health care provider. Make sure you discuss any questions you have with your health care provider. Document Revised: 04/13/2021 Document Reviewed: 04/13/2021 Elsevier Patient Education  2024 [employer].

## 2024-04-04 NOTE — Progress Notes (Signed)
 Subjective:    Patient ID: [name], male    DOB: Jan 29, 1941, 83 y.o.   MRN: [id number]  Chief Complaint  Patient presents with   Medical Management of Chronic Issues   Pt presents to the office today for chronic follow up. [facility]   PT is followed by Cardiologists for CAD, CHF, Atherosclerosis, Cardiomyopathy, and Ventricular tachycardia every 2 years.  PT has pacemaker and is followed annually for this.    Pt is followed by Dermatologists for psoriasis as needed.     Has OSA and uses CPAP nightly.    He  reports he is drinking three beers a day. He reports constant burning and aching pain of bilateral feet from alocholic peripheral neuropathy that is 8-9 out 10. He usually takes Lyrica  75 mg BID.    He has aortic atheroscleroses and takes pravastatin  daily.   He had a CVA in 05/13/22, with mild left sided weakness. He is doing PT. This has greatly improved.    He has emphysema. Smoking less 1/2 a pack a day. Denies any SOB.   He has taken his demadex  10 mg daily.     04/04/2024    9:28 AM 03/04/2024    3:48 PM 01/19/2024   12:51 PM  Last 3 Weights  Weight (lbs) 137 lb 6.4 oz 139 lb 139 lb  Weight (kg) 62.324 kg 63.05 kg 63.05 kg     He was hospitalized for CAP on 12/21/23. He was discharged on Omnicef and doxycycline  for the next 5 days. He has completed these. He was discharged with home O2. He does not have this on today, but wears it at home when needed.  Hypertension This is a chronic problem. The current episode started more than 1 year ago. The problem has been waxing and waning since onset. The problem is uncontrolled. Pertinent negatives include no malaise/fatigue, peripheral edema or shortness of breath. Risk factors for coronary artery disease include dyslipidemia, male gender, sedentary lifestyle and smoking/tobacco exposure. The current treatment provides moderate improvement. Hypertensive end-organ damage includes CVA.  Congestive  Heart Failure Presents for follow-up visit. Associated symptoms include fatigue and nocturia (2). Pertinent negatives include no edema, orthopnea or shortness of breath. The symptoms have been stable.  Gastroesophageal Reflux He complains of belching and heartburn. This is a chronic problem. The current episode started more than 1 year ago. The problem occurs occasionally. The symptoms are aggravated by certain foods. Associated symptoms include fatigue. Pertinent negatives include no orthopnea. He has tried a PPI for the symptoms. The treatment provided moderate relief.  Benign Prostatic Hypertrophy This is a chronic problem. The current episode started more than 1 year ago. Irritative symptoms include nocturia (2). Obstructive symptoms include an intermittent stream, straining and a weak stream. Past treatments include tamsulosin . The treatment provided moderate relief.  Nicotine  Dependence Presents for follow-up visit. Symptoms include fatigue. The symptoms have been stable. He smokes < 1/2 a pack of cigarettes per day.      Review of Systems  Constitutional:  Positive for fatigue. Negative for malaise/fatigue.  Respiratory:  Negative for shortness of breath.   Gastrointestinal:  Positive for heartburn.  Genitourinary:  Positive for nocturia (2).  All other systems reviewed and are negative.      Objective:   Physical Exam Vitals reviewed.  Constitutional:      General: He is not in acute distress.    Appearance: He is well-developed.  HENT:  Head: Normocephalic.     Right Ear: Tympanic membrane normal.     Left Ear: Tympanic membrane normal.  Eyes:     General:        Right eye: No discharge.        Left eye: No discharge.     Pupils: Pupils are equal, round, and reactive to light.  Neck:     Thyroid : No thyromegaly.  Cardiovascular:     Rate and Rhythm: Normal rate and regular rhythm.     Heart sounds: Normal heart sounds. No murmur heard. Pulmonary:     Effort:  Pulmonary effort is normal. No respiratory distress.     Breath sounds: Normal breath sounds. No wheezing.  Abdominal:     General: Bowel sounds are normal. There is no distension.     Palpations: Abdomen is soft.     Tenderness: There is no abdominal tenderness.  Musculoskeletal:        General: No tenderness. Normal range of motion.     Cervical back: Normal range of motion and neck supple.  Skin:    General: Skin is warm and dry.     Coloration: Skin is not pale.     Findings: No erythema or rash.  Neurological:     Mental Status: He is alert and oriented to person, place, and time.     Cranial Nerves: No cranial nerve deficit.     Motor: Weakness present.     Gait: Gait abnormal (using cane).     Deep Tendon Reflexes: Reflexes are normal and symmetric.  Psychiatric:        Behavior: Behavior normal.        Thought Content: Thought content normal.        Judgment: Judgment normal.       BP (!) 164/88   Pulse 70   Temp (!) 97.5 F (36.4 C) (Temporal)   Ht 5' 6 (1.676 m)   Wt 137 lb 6.4 oz (62.3 kg)   SpO2 98%   BMI 22.18 kg/m      Assessment & Plan:  AVRUM KIMBALL comes in today with chief complaint of Medical Management of Chronic Issues   Diagnosis and orders addressed:  1. GAD (generalized anxiety disorder) - ALPRAZolam  (XANAX ) 1 MG tablet; TAKE 1 TABLET BY MOUTH TWICE DAILY . APPOINTMENT REQUIRED FOR FUTURE REFILLS  Dispense: 60 tablet; Refill: 2 - CBC with Differential/Platelet - CMP14+EGFR  2. Essential hypertension - lisinopril  (ZESTRIL ) 40 MG tablet; Take 1 tablet (40 mg total) by mouth daily.  Dispense: 90 tablet; Refill: 0 - metoprolol  tartrate (LOPRESSOR ) 100 MG tablet; Take 1 tablet (100 mg total) by mouth 2 (two) times daily.  Dispense: 180 tablet; Refill: 0 - CBC with Differential/Platelet - CMP14+EGFR  3. Alcoholic peripheral neuropathy  - pregabalin  (LYRICA ) 75 MG capsule; Take 1 capsule (75 mg total) by mouth 2 (two) times daily.   Dispense: 180 capsule; Refill: 0 - CBC with Differential/Platelet - CMP14+EGFR  4. Benign prostatic hyperplasia with incomplete bladder emptying - tamsulosin  (FLOMAX ) 0.4 MG CAPS capsule; Take 1 capsule (0.4 mg total) by mouth daily.  Dispense: 90 capsule; Refill: 0 - CBC with Differential/Platelet - CMP14+EGFR  5. Encounter for immunization (Primary) - Flu vaccine HIGH DOSE PF(Fluzone Trivalent)  6. Alcohol  abuse  - CBC with Differential/Platelet - CMP14+EGFR  7. Aortic atherosclerosis - CBC with Differential/Platelet - CMP14+EGFR  8. Benzodiazepine dependence (HCC)  - CBC with Differential/Platelet - CMP14+EGFR  9. Centrilobular emphysema (HCC) - CBC  with Differential/Platelet - CMP14+EGFR  10. Controlled substance agreement signed  - CBC with Differential/Platelet - CMP14+EGFR  11. Late effect of cerebrovascular accident (CVA) - CBC with Differential/Platelet - CMP14+EGFR  12. Presence of heart assist device [facility])     Labs pending  Continue current medications  Patient reviewed in [place] controlled database, no flags noted. Contract and drug screen are up to date.  Health Maintenance reviewed Diet and exercise encouraged  Follow up plan: 3 months   Bari Learn, FNP

## 2024-04-05 ENCOUNTER — Ambulatory Visit: Payer: Self-pay | Admitting: Family

## 2024-04-08 NOTE — Progress Notes (Signed)
 [name]                                          MRN: [id number]   04/08/2024   The [facility] Quality Team Specialist reviewed this patient medical record for the purposes of chart review for care gap closure. The following were reviewed: abstraction for care gap closure-kidney health evaluation for diabetes:eGFR  and uACR.    [facility] Quality Team

## 2024-05-03 ENCOUNTER — Encounter

## 2024-05-07 ENCOUNTER — Encounter: Payer: Self-pay | Admitting: Cardiology

## 2024-05-07 MED ORDER — TORSEMIDE 20 MG PO TABS: 10.0000 mg | ORAL_TABLET | Freq: Every day | ORAL | 4 refills | Status: AC

## 2024-05-20 ENCOUNTER — Ambulatory Visit

## 2024-05-20 DIAGNOSIS — I5022 Chronic systolic (congestive) heart failure: Secondary | ICD-10-CM

## 2024-05-21 LAB — CUP PACEART REMOTE DEVICE CHECK
Battery Remaining Longevity: 102 mo
Battery Remaining Percentage: 100 %
Brady Statistic RA Percent Paced: 0 %
Brady Statistic RV Percent Paced: 99 %
Date Time Interrogation Session: 20251201004200
HighPow Impedance: 47 Ohm
Implantable Lead Connection Status: 753985
Implantable Lead Connection Status: 753985
Implantable Lead Connection Status: 753985
Implantable Lead Implant Date: 20040312
Implantable Lead Implant Date: 20040312
Implantable Lead Implant Date: 20040312
Implantable Lead Location: 753858
Implantable Lead Location: 753859
Implantable Lead Location: 753860
Implantable Lead Model: 158
Implantable Lead Model: 4087
Implantable Lead Model: 4513
Implantable Lead Serial Number: 129158
Implantable Lead Serial Number: 209446
Implantable Lead Serial Number: 407264
Implantable Pulse Generator Implant Date: 20250331
Lead Channel Impedance Value: 1186 Ohm
Lead Channel Impedance Value: 507 Ohm
Lead Channel Impedance Value: 649 Ohm
Lead Channel Pacing Threshold Amplitude: 0.7 V
Lead Channel Pacing Threshold Pulse Width: 0.4 ms
Lead Channel Setting Pacing Amplitude: 2.5 V
Lead Channel Setting Pacing Amplitude: 3.3 V
Lead Channel Setting Pacing Amplitude: 3.5 V
Lead Channel Setting Pacing Pulse Width: 0.4 ms
Lead Channel Setting Pacing Pulse Width: 0.8 ms
Lead Channel Setting Sensing Sensitivity: 0.5 mV
Lead Channel Setting Sensing Sensitivity: 1 mV
Pulse Gen Serial Number: 107566

## 2024-05-23 ENCOUNTER — Ambulatory Visit: Payer: Self-pay | Admitting: Cardiovascular Disease

## 2024-05-24 NOTE — Progress Notes (Signed)
 Remote ICD Transmission

## 2024-05-26 ENCOUNTER — Other Ambulatory Visit: Payer: Self-pay | Admitting: Family

## 2024-05-26 DIAGNOSIS — K219 Gastro-esophageal reflux disease without esophagitis: Secondary | ICD-10-CM

## 2024-06-12 ENCOUNTER — Other Ambulatory Visit: Payer: Self-pay | Admitting: Family

## 2024-06-12 DIAGNOSIS — I10 Essential (primary) hypertension: Secondary | ICD-10-CM

## 2024-07-05 ENCOUNTER — Ambulatory Visit: Payer: Self-pay | Admitting: Family

## 2024-07-05 ENCOUNTER — Encounter: Payer: Self-pay | Admitting: Family

## 2024-07-05 VITALS — BP 149/77 | HR 70 | Temp 97.6°F | Ht 66.0 in | Wt 138.6 lb

## 2024-07-05 DIAGNOSIS — I509 Heart failure, unspecified: Secondary | ICD-10-CM | POA: Diagnosis not present

## 2024-07-05 DIAGNOSIS — I480 Paroxysmal atrial fibrillation: Secondary | ICD-10-CM | POA: Diagnosis not present

## 2024-07-05 DIAGNOSIS — N1831 Chronic kidney disease, stage 3a: Secondary | ICD-10-CM | POA: Diagnosis not present

## 2024-07-05 DIAGNOSIS — G4733 Obstructive sleep apnea (adult) (pediatric): Secondary | ICD-10-CM

## 2024-07-05 DIAGNOSIS — Z95811 Presence of heart assist device: Secondary | ICD-10-CM

## 2024-07-05 DIAGNOSIS — N401 Enlarged prostate with lower urinary tract symptoms: Secondary | ICD-10-CM | POA: Diagnosis not present

## 2024-07-05 DIAGNOSIS — K219 Gastro-esophageal reflux disease without esophagitis: Secondary | ICD-10-CM | POA: Diagnosis not present

## 2024-07-05 DIAGNOSIS — J432 Centrilobular emphysema: Secondary | ICD-10-CM | POA: Diagnosis not present

## 2024-07-05 DIAGNOSIS — G621 Alcoholic polyneuropathy: Secondary | ICD-10-CM | POA: Diagnosis not present

## 2024-07-05 DIAGNOSIS — I10 Essential (primary) hypertension: Secondary | ICD-10-CM | POA: Diagnosis not present

## 2024-07-05 DIAGNOSIS — F1320 Sedative, hypnotic or anxiolytic dependence, uncomplicated: Secondary | ICD-10-CM | POA: Diagnosis not present

## 2024-07-05 DIAGNOSIS — F411 Generalized anxiety disorder: Secondary | ICD-10-CM | POA: Diagnosis not present

## 2024-07-05 LAB — CMP14+EGFR
ALT: 20 IU/L (ref 0–44)
AST: 38 IU/L (ref 0–40)
Albumin: 4.5 g/dL (ref 3.7–4.7)
Alkaline Phosphatase: 148 IU/L — ABNORMAL HIGH (ref 48–129)
BUN/Creatinine Ratio: 12 (ref 10–24)
BUN: 19 mg/dL (ref 8–27)
Bilirubin Total: 0.5 mg/dL (ref 0.0–1.2)
CO2: 22 mmol/L (ref 20–29)
Calcium: 9.7 mg/dL (ref 8.6–10.2)
Chloride: 95 mmol/L — ABNORMAL LOW (ref 96–106)
Creatinine, Ser: 1.58 mg/dL — ABNORMAL HIGH (ref 0.76–1.27)
Globulin, Total: 3.3 g/dL (ref 1.5–4.5)
Glucose: 86 mg/dL (ref 70–99)
Potassium: 4.3 mmol/L (ref 3.5–5.2)
Sodium: 133 mmol/L — ABNORMAL LOW (ref 134–144)
Total Protein: 7.8 g/dL (ref 6.0–8.5)
eGFR: 44 mL/min/1.73 — ABNORMAL LOW

## 2024-07-05 MED ORDER — TAMSULOSIN HCL 0.4 MG PO CAPS: 0.4000 mg | ORAL_CAPSULE | Freq: Every day | ORAL | 0 refills | Status: AC

## 2024-07-05 MED ORDER — METOPROLOL TARTRATE 100 MG PO TABS: 100.0000 mg | ORAL_TABLET | Freq: Two times a day (BID) | ORAL | 0 refills | Status: AC

## 2024-07-05 MED ORDER — LISINOPRIL 40 MG PO TABS: 40.0000 mg | ORAL_TABLET | Freq: Every day | ORAL | 0 refills | Status: AC

## 2024-07-05 MED ORDER — PANTOPRAZOLE SODIUM 40 MG PO TBEC: 40.0000 mg | DELAYED_RELEASE_TABLET | Freq: Every day | ORAL | 0 refills | Status: AC

## 2024-07-05 MED ORDER — PREGABALIN 75 MG PO CAPS: 75.0000 mg | ORAL_CAPSULE | Freq: Two times a day (BID) | ORAL | 0 refills | Status: AC

## 2024-07-05 MED ORDER — ALPRAZOLAM 1 MG PO TABS: ORAL_TABLET | ORAL | 2 refills | Status: AC

## 2024-07-05 NOTE — Patient Instructions (Signed)
 Hypertension, Adult High blood pressure (hypertension) is when the force of blood pumping through the arteries is too strong. The arteries are the blood vessels that carry blood from the heart throughout the body. Hypertension forces the heart to work harder to pump blood and may cause arteries to become narrow or stiff. Untreated or uncontrolled hypertension can lead to a heart attack, heart failure, a stroke, kidney disease, and other problems. A blood pressure reading consists of a higher number over a lower number. Ideally, your blood pressure should be below 120/80. The first ("top") number is called the systolic pressure. It is a measure of the pressure in your arteries as your heart beats. The second ("bottom") number is called the diastolic pressure. It is a measure of the pressure in your arteries as the heart relaxes. What are the causes? The exact cause of this condition is not known. There are some conditions that result in high blood pressure. What increases the risk? Certain factors may make you more likely to develop high blood pressure. Some of these risk factors are under your control, including: Smoking. Not getting enough exercise or physical activity. Being overweight. Having too much fat, sugar, calories, or salt (sodium) in your diet. Drinking too much alcohol. Other risk factors include: Having a personal history of heart disease, diabetes, high cholesterol, or kidney disease. Stress. Having a family history of high blood pressure and high cholesterol. Having obstructive sleep apnea. Age. The risk increases with age. What are the signs or symptoms? High blood pressure may not cause symptoms. Very high blood pressure (hypertensive crisis) may cause: Headache. Fast or irregular heartbeats (palpitations). Shortness of breath. Nosebleed. Nausea and vomiting. Vision changes. Severe chest pain, dizziness, and seizures. How is this diagnosed? This condition is diagnosed by  measuring your blood pressure while you are seated, with your arm resting on a flat surface, your legs uncrossed, and your feet flat on the floor. The cuff of the blood pressure monitor will be placed directly against the skin of your upper arm at the level of your heart. Blood pressure should be measured at least twice using the same arm. Certain conditions can cause a difference in blood pressure between your right and left arms. If you have a high blood pressure reading during one visit or you have normal blood pressure with other risk factors, you may be asked to: Return on a different day to have your blood pressure checked again. Monitor your blood pressure at home for 1 week or longer. If you are diagnosed with hypertension, you may have other blood or imaging tests to help your health care provider understand your overall risk for other conditions. How is this treated? This condition is treated by making healthy lifestyle changes, such as eating healthy foods, exercising more, and reducing your alcohol intake. You may be referred for counseling on a healthy diet and physical activity. Your health care provider may prescribe medicine if lifestyle changes are not enough to get your blood pressure under control and if: Your systolic blood pressure is above 130. Your diastolic blood pressure is above 80. Your personal target blood pressure may vary depending on your medical conditions, your age, and other factors. Follow these instructions at home: Eating and drinking  Eat a diet that is high in fiber and potassium, and low in sodium, added sugar, and fat. An example of this eating plan is called the DASH diet. DASH stands for Dietary Approaches to Stop Hypertension. To eat this way: Eat  plenty of fresh fruits and vegetables. Try to fill one half of your plate at each meal with fruits and vegetables. Eat whole grains, such as whole-wheat pasta, brown rice, or whole-grain bread. Fill about one  fourth of your plate with whole grains. Eat or drink low-fat dairy products, such as skim milk or low-fat yogurt. Avoid fatty cuts of meat, processed or cured meats, and poultry with skin. Fill about one fourth of your plate with lean proteins, such as fish, chicken without skin, beans, eggs, or tofu. Avoid pre-made and processed foods. These tend to be higher in sodium, added sugar, and fat. Reduce your daily sodium intake. Many people with hypertension should eat less than 1,500 mg of sodium a day. Do not drink alcohol if: Your health care provider tells you not to drink. You are pregnant, may be pregnant, or are planning to become pregnant. If you drink alcohol: Limit how much you have to: 0-1 drink a day for women. 0-2 drinks a day for men. Know how much alcohol is in your drink. In the [place] one drink equals one 12 oz bottle of beer (355 mL), one 5 oz glass of wine (148 mL), or one 1 oz glass of hard liquor (44 mL). Lifestyle  Work with your health care provider to maintain a healthy body weight or to lose weight. Ask what an ideal weight is for you. Get at least 30 minutes of exercise that causes your heart to beat faster (aerobic exercise) most days of the week. Activities may include walking, swimming, or biking. Include exercise to strengthen your muscles (resistance exercise), such as Pilates or lifting weights, as part of your weekly exercise routine. Try to do these types of exercises for 30 minutes at least 3 days a week. Do not use any products that contain nicotine or tobacco. These products include cigarettes, chewing tobacco, and vaping devices, such as e-cigarettes. If you need help quitting, ask your health care provider. Monitor your blood pressure at home as told by your health care provider. Keep all follow-up visits. This is important. Medicines Take over-the-counter and prescription medicines only as told by your health care provider. Follow directions carefully. Blood  pressure medicines must be taken as prescribed. Do not skip doses of blood pressure medicine. Doing this puts you at risk for problems and can make the medicine less effective. Ask your health care provider about side effects or reactions to medicines that you should watch for. Contact a health care provider if you: Think you are having a reaction to a medicine you are taking. Have headaches that keep coming back (recurring). Feel dizzy. Have swelling in your ankles. Have trouble with your vision. Get help right away if you: Develop a severe headache or confusion. Have unusual weakness or numbness. Feel faint. Have severe pain in your chest or abdomen. Vomit repeatedly. Have trouble breathing. These symptoms may be an emergency. Get help right away. Call 911. Do not wait to see if the symptoms will go away. Do not drive yourself to the hospital. Summary Hypertension is when the force of blood pumping through your arteries is too strong. If this condition is not controlled, it may put you at risk for serious complications. Your personal target blood pressure may vary depending on your medical conditions, your age, and other factors. For most people, a normal blood pressure is less than 120/80. Hypertension is treated with lifestyle changes, medicines, or a combination of both. Lifestyle changes include losing weight, eating a healthy,  low-sodium diet, exercising more, and limiting alcohol. This information is not intended to replace advice given to you by your health care provider. Make sure you discuss any questions you have with your health care provider. Document Revised: 04/13/2021 Document Reviewed: 04/13/2021 Elsevier Patient Education  2024 [employer].

## 2024-07-05 NOTE — Progress Notes (Signed)
 "  Subjective:    Patient ID: [name], male    DOB: 1940-09-20, 84 y.o.   MRN: [id number]  Chief Complaint  Patient presents with   Medical Management of Chronic Issues   Pt presents to the office today for chronic follow up. [facility]   PT is followed by Cardiologists for CAD, CHF, Atherosclerosis, Cardiomyopathy, and Ventricular tachycardia every 2 years.  PT has pacemaker and is followed annually for this.    Pt is followed by Dermatologists for psoriasis as needed.     Has OSA and uses CPAP nightly.    He  reports he is drinking three beers a day. He reports constant burning and aching pain of bilateral feet from alocholic peripheral neuropathy that is 6-7 out 10. He usually takes Lyrica  75 mg BID.    He has aortic atheroscleroses and takes pravastatin  daily.   He had a CVA in 05/13/22, with mild left sided weakness. He is doing PT. This has greatly improved.    He has emphysema. Smoking less 1/2 a pack a day. Denies any SOB.   He has taken his demadex  10 mg daily.     07/05/2024    9:16 AM 04/04/2024    9:28 AM 03/04/2024    3:48 PM  Last 3 Weights  Weight (lbs) 138 lb 9.6 oz 137 lb 6.4 oz 139 lb  Weight (kg) 62.869 kg 62.324 kg 63.05 kg     Hypertension This is a chronic problem. The current episode started more than 1 year ago. The problem has been waxing and waning since onset. The problem is uncontrolled. Pertinent negatives include no malaise/fatigue, peripheral edema or shortness of breath. Risk factors for coronary artery disease include dyslipidemia, male gender, sedentary lifestyle and smoking/tobacco exposure. The current treatment provides moderate improvement. Hypertensive end-organ damage includes CVA.  Congestive Heart Failure Presents for follow-up visit. Associated symptoms include fatigue and nocturia (2). Pertinent negatives include no edema, orthopnea or shortness of breath. The symptoms have been stable.   Gastroesophageal Reflux He complains of belching and heartburn. This is a chronic problem. The current episode started more than 1 year ago. The problem occurs occasionally. The symptoms are aggravated by certain foods. Associated symptoms include fatigue. Pertinent negatives include no orthopnea. He has tried a PPI for the symptoms. The treatment provided moderate relief.  Benign Prostatic Hypertrophy This is a chronic problem. The current episode started more than 1 year ago. Irritative symptoms include nocturia (2). Obstructive symptoms include an intermittent stream, straining and a weak stream. Past treatments include tamsulosin . The treatment provided moderate relief.  Nicotine  Dependence Presents for follow-up visit. Symptoms include fatigue. The symptoms have been stable. He smokes < 1/2 a pack of cigarettes per day.      Review of Systems  Constitutional:  Positive for fatigue. Negative for malaise/fatigue.  Respiratory:  Negative for shortness of breath.   Gastrointestinal:  Positive for heartburn.  Genitourinary:  Positive for nocturia (2).  All other systems reviewed and are negative.  Family History  Problem Relation Age of Onset   Heart failure Father    Colon polyps Sister    GI Bleed Sister    GI Bleed Brother    Heart attack Brother    Psoriasis Daughter    Arthritis/Rheumatoid Daughter    Cancer Brother    Heart disease Brother    Heart disease Brother    Colon cancer Neg Hx    Esophageal cancer Neg  Hx    Rectal cancer Neg Hx    Stomach cancer Neg Hx    Social History   Socioeconomic History   Marital status: Divorced    Spouse name: Not on file   Number of children: 4   Years of education: Some college   Highest education level: 8th grade  Occupational History   Occupation: Retired    Associate Professor: [employer]  Tobacco Use   Smoking status: Some Days    Current packs/day: 0.25    Average packs/day: 0.3 packs/day for 45.0 years (11.3 ttl pk-yrs)     Types: Cigarettes   Smokeless tobacco: Former    Types: Chew    Quit date: 06/20/1989   Tobacco comments:    he is not interested in cessation    Patient started back smoking about 3 Cigarettes a daily  Vaping Use   Vaping status: Never Used  Substance and Sexual Activity   Alcohol  use: Yes    Alcohol /week: 6.0 standard drinks of alcohol     Types: 6 Cans of beer per week    Comment: drings 4-6 beers per day   Drug use: No    Types: Marijuana    Comment: last used marijuana yesterday   Sexual activity: Not Currently    Birth control/protection: None  Other Topics Concern   Not on file  Social History Narrative   Divorced 2 sons 2 daughters   Is retired from administrator, sports, he was a psychologist, occupational   3 alcoholic beverages daily no caffeine no drugs he still smokes no other tobacco or drug use   Social Drivers of Health   Tobacco Use: High Risk (07/05/2024)   Patient History    Smoking Tobacco Use: Some Days    Smokeless Tobacco Use: Former    Passive Exposure: Not on Actuary Strain: Low Risk (03/04/2024)   Overall Financial Resource Strain (CARDIA)    Difficulty of Paying Living Expenses: Not hard at all  Food Insecurity: No Food Insecurity (03/04/2024)   Epic    Worried About Programme Researcher, Broadcasting/film/video in the Last Year: Never true    Ran Out of Food in the Last Year: Never true  Transportation Needs: No Transportation Needs (03/04/2024)   Epic    Lack of Transportation (Medical): No    Lack of Transportation (Non-Medical): No  Physical Activity: Inactive (03/04/2024)   Exercise Vital Sign    Days of Exercise per Week: 0 days    Minutes of Exercise per Session: 0 min  Stress: No Stress Concern Present (03/04/2024)   [employer] of Occupational Health - Occupational Stress Questionnaire    Feeling of Stress: Not at all  Social Connections: Socially Isolated (03/04/2024)   Social Connection and Isolation Panel    Frequency of Communication with Friends and  Family: More than three times a week    Frequency of Social Gatherings with Friends and Family: More than three times a week    Attends Religious Services: Never    Database Administrator or Organizations: No    Attends Banker Meetings: Never    Marital Status: Divorced  Depression (PHQ2-9): Low Risk (07/05/2024)   Depression (PHQ2-9)    PHQ-2 Score: 2  Alcohol  Screen: Low Risk (03/04/2024)   Alcohol  Screen    Last Alcohol  Screening Score (AUDIT): 0  Housing: Unknown (03/04/2024)   Epic    Unable to Pay for Housing in the Last Year: No    Number of Times  Moved in the Last Year: Not on file    Homeless in the Last Year: No  Utilities: Not At Risk (03/04/2024)   Epic    Threatened with loss of utilities: No  Health Literacy: Adequate Health Literacy (03/04/2024)   B1300 Health Literacy    Frequency of need for help with medical instructions: Never  Recent Concern: Health Literacy - Medium Risk (12/21/2023)   Received from [facility]    How often do you need to have someone help you when you read instructions, pamphlets, or other written material from your doctor or pharmacy?: Sometimes       Objective:   Physical Exam Vitals reviewed.  Constitutional:      General: He is not in acute distress.    Appearance: He is well-developed.  HENT:     Head: Normocephalic.     Right Ear: Tympanic membrane normal.     Left Ear: Tympanic membrane normal.  Eyes:     General:        Right eye: No discharge.        Left eye: No discharge.     Pupils: Pupils are equal, round, and reactive to light.  Neck:     Thyroid : No thyromegaly.  Cardiovascular:     Rate and Rhythm: Normal rate and regular rhythm.     Heart sounds: Normal heart sounds. No murmur heard. Pulmonary:     Effort: Pulmonary effort is normal. No respiratory distress.     Breath sounds: Normal breath sounds. No wheezing.  Abdominal:     General: Bowel sounds are normal. There is no  distension.     Palpations: Abdomen is soft.     Tenderness: There is no abdominal tenderness.  Musculoskeletal:        General: No tenderness. Normal range of motion.     Cervical back: Normal range of motion and neck supple.  Skin:    General: Skin is warm and dry.     Coloration: Skin is not pale.     Findings: No erythema or rash.  Neurological:     Mental Status: He is alert and oriented to person, place, and time.     Cranial Nerves: No cranial nerve deficit.     Motor: Weakness present.     Gait: Gait abnormal (using cane).     Deep Tendon Reflexes: Reflexes are normal and symmetric.  Psychiatric:        Behavior: Behavior normal.        Thought Content: Thought content normal.        Judgment: Judgment normal.      BP (!) 164/88   Pulse 70   Temp 97.6 F (36.4 C) (Temporal)   Ht 5' 6 (1.676 m)   Wt 138 lb 9.6 oz (62.9 kg)   BMI 22.37 kg/m      Assessment & Plan:  MERCURY ROCK comes in today with chief complaint of Medical Management of Chronic Issues   Diagnosis and orders addressed:  1. GAD (generalized anxiety disorder) - ALPRAZolam  (XANAX ) 1 MG tablet; TAKE 1 TABLET BY MOUTH TWICE DAILY . APPOINTMENT REQUIRED FOR FUTURE REFILLS  Dispense: 60 tablet; Refill: 2 - CMP14+EGFR  2. Essential hypertension (Primary) PT will talk to his daughter and let me know what BP  >150 will increase Norvasc  to 5 mg from 2.5 mg -Daily blood pressure log given with instructions on how to fill out and told to bring to next  visit -Dash diet information given -Exercise encouraged - Stress Management  -Continue current meds - lisinopril  (ZESTRIL ) 40 MG tablet; Take 1 tablet (40 mg total) by mouth daily.  Dispense: 90 tablet; Refill: 0 - metoprolol  tartrate (LOPRESSOR ) 100 MG tablet; Take 1 tablet (100 mg total) by mouth 2 (two) times daily.  Dispense: 180 tablet; Refill: 0 - CMP14+EGFR  3. Gastroesophageal reflux disease, unspecified whether esophagitis present -  pantoprazole  (PROTONIX ) 40 MG tablet; Take 1 tablet (40 mg total) by mouth daily.  Dispense: 90 tablet; Refill: 0 - CMP14+EGFR  4. Alcoholic peripheral neuropathy - pregabalin  (LYRICA ) 75 MG capsule; Take 1 capsule (75 mg total) by mouth 2 (two) times daily.  Dispense: 180 capsule; Refill: 0 - CMP14+EGFR  5. Benign prostatic hyperplasia with incomplete bladder emptying - tamsulosin  (FLOMAX ) 0.4 MG CAPS capsule; Take 1 capsule (0.4 mg total) by mouth daily.  Dispense: 90 capsule; Refill: 0 - CMP14+EGFR  6. Paroxysmal atrial fibrillation (HCC) - CMP14+EGFR  7. Acute on chronic congestive heart failure, unspecified heart failure type (HCC) - CMP14+EGFR  8. OSA (obstructive sleep apnea) - CMP14+EGFR - For home use only DME Other see comment  9. Presence of heart assist device (HCC) - CMP14+EGFR  10. Benzodiazepine dependence (HCC) - CMP14+EGFR  11. Centrilobular emphysema (HCC) - CMP14+EGFR  12. CKD stage G3a/A1, GFR 45-59 and albumin creatinine ratio <30 mg/g (HCC) Avoid NSADI's  - CMP14+EGFR   Labs pending  Continue current medications  Patient reviewed in [place] controlled database, no flags noted. Contract and drug screen are up to date.  PT will talk to his daughter and let me know what BP  >150 will increase Norvasc  to 5 mg from 2.5 mg Health Maintenance reviewed Diet and exercise encouraged  Follow up plan: 3 months   Bari Learn, FNP   "

## 2024-07-08 ENCOUNTER — Ambulatory Visit: Payer: Self-pay | Admitting: Family

## 2024-08-02 ENCOUNTER — Encounter

## 2024-08-19 ENCOUNTER — Encounter

## 2024-11-01 ENCOUNTER — Encounter

## 2024-11-18 ENCOUNTER — Encounter

## 2025-03-05 ENCOUNTER — Ambulatory Visit: Payer: Self-pay
